# Patient Record
Sex: Male | Born: 1935 | Race: White | Hispanic: No | Marital: Married | Smoking: Current every day smoker
Health system: Southern US, Community
[De-identification: ages and names within clinical notes are randomized; demographics above are authoritative.]

## PROBLEM LIST (undated history)

## (undated) DIAGNOSIS — R55 Syncope and collapse: Secondary | ICD-10-CM

## (undated) DIAGNOSIS — J449 Chronic obstructive pulmonary disease, unspecified: Secondary | ICD-10-CM

## (undated) DIAGNOSIS — IMO0002 Reserved for concepts with insufficient information to code with codable children: Secondary | ICD-10-CM

## (undated) DIAGNOSIS — Z972 Presence of dental prosthetic device (complete) (partial): Secondary | ICD-10-CM

## (undated) DIAGNOSIS — E7800 Pure hypercholesterolemia, unspecified: Secondary | ICD-10-CM

## (undated) DIAGNOSIS — Z720 Tobacco use: Secondary | ICD-10-CM

## (undated) DIAGNOSIS — R0600 Dyspnea, unspecified: Secondary | ICD-10-CM

## (undated) DIAGNOSIS — N183 Chronic kidney disease, stage 3 unspecified: Secondary | ICD-10-CM

## (undated) DIAGNOSIS — F419 Anxiety disorder, unspecified: Secondary | ICD-10-CM

## (undated) DIAGNOSIS — N400 Enlarged prostate without lower urinary tract symptoms: Secondary | ICD-10-CM

## (undated) DIAGNOSIS — I10 Essential (primary) hypertension: Secondary | ICD-10-CM

## (undated) DIAGNOSIS — R5382 Chronic fatigue, unspecified: Secondary | ICD-10-CM

## (undated) DIAGNOSIS — E119 Type 2 diabetes mellitus without complications: Secondary | ICD-10-CM

## (undated) DIAGNOSIS — M1990 Unspecified osteoarthritis, unspecified site: Secondary | ICD-10-CM

## (undated) DIAGNOSIS — I739 Peripheral vascular disease, unspecified: Secondary | ICD-10-CM

## (undated) HISTORY — PX: APPENDECTOMY: SHX54

## (undated) HISTORY — DX: Reserved for concepts with insufficient information to code with codable children: IMO0002

## (undated) HISTORY — DX: Chronic obstructive pulmonary disease, unspecified: J44.9

## (undated) HISTORY — DX: Tobacco use: Z72.0

## (undated) HISTORY — PX: SPINE SURGERY: SHX786

## (undated) HISTORY — DX: Chronic fatigue, unspecified: R53.82

## (undated) HISTORY — PX: OTHER SURGICAL HISTORY: SHX169

---

## 2003-03-22 ENCOUNTER — Encounter: Payer: Self-pay | Admitting: Internal Medicine

## 2003-03-22 ENCOUNTER — Encounter: Admission: RE | Admit: 2003-03-22 | Discharge: 2003-03-22 | Payer: Self-pay | Admitting: Internal Medicine

## 2004-10-23 ENCOUNTER — Ambulatory Visit: Payer: Self-pay | Admitting: Family Medicine

## 2005-09-02 ENCOUNTER — Emergency Department (HOSPITAL_COMMUNITY): Admission: EM | Admit: 2005-09-02 | Discharge: 2005-09-02 | Payer: Self-pay | Admitting: Emergency Medicine

## 2008-02-03 ENCOUNTER — Inpatient Hospital Stay (HOSPITAL_COMMUNITY): Admission: EM | Admit: 2008-02-03 | Discharge: 2008-02-05 | Payer: Self-pay | Admitting: Emergency Medicine

## 2008-02-03 ENCOUNTER — Ambulatory Visit: Payer: Self-pay | Admitting: Pulmonary Disease

## 2008-02-03 ENCOUNTER — Ambulatory Visit: Payer: Self-pay | Admitting: Internal Medicine

## 2008-02-04 ENCOUNTER — Encounter: Payer: Self-pay | Admitting: Pulmonary Disease

## 2008-02-05 ENCOUNTER — Encounter (INDEPENDENT_AMBULATORY_CARE_PROVIDER_SITE_OTHER): Payer: Self-pay | Admitting: *Deleted

## 2008-02-05 ENCOUNTER — Encounter (INDEPENDENT_AMBULATORY_CARE_PROVIDER_SITE_OTHER): Payer: Self-pay | Admitting: Internal Medicine

## 2008-02-05 DIAGNOSIS — I10 Essential (primary) hypertension: Secondary | ICD-10-CM | POA: Insufficient documentation

## 2008-02-05 DIAGNOSIS — J209 Acute bronchitis, unspecified: Secondary | ICD-10-CM | POA: Insufficient documentation

## 2008-02-05 DIAGNOSIS — M889 Osteitis deformans of unspecified bone: Secondary | ICD-10-CM | POA: Insufficient documentation

## 2008-02-05 DIAGNOSIS — F17200 Nicotine dependence, unspecified, uncomplicated: Secondary | ICD-10-CM | POA: Insufficient documentation

## 2008-02-05 DIAGNOSIS — J449 Chronic obstructive pulmonary disease, unspecified: Secondary | ICD-10-CM | POA: Insufficient documentation

## 2008-02-14 ENCOUNTER — Encounter (INDEPENDENT_AMBULATORY_CARE_PROVIDER_SITE_OTHER): Payer: Self-pay | Admitting: *Deleted

## 2008-02-14 ENCOUNTER — Ambulatory Visit: Payer: Self-pay | Admitting: Internal Medicine

## 2008-02-14 LAB — CONVERTED CEMR LAB
BUN: 27 mg/dL — ABNORMAL HIGH (ref 6–23)
CO2: 26 meq/L (ref 19–32)
Calcium: 9.9 mg/dL (ref 8.4–10.5)
Chloride: 101 meq/L (ref 96–112)
Creatinine, Ser: 1.78 mg/dL — ABNORMAL HIGH (ref 0.40–1.50)
Glucose, Bld: 95 mg/dL (ref 70–99)
Potassium: 5 meq/L (ref 3.5–5.3)
Sodium: 139 meq/L (ref 135–145)

## 2008-02-28 ENCOUNTER — Encounter: Payer: Self-pay | Admitting: Internal Medicine

## 2008-02-28 ENCOUNTER — Ambulatory Visit: Payer: Self-pay | Admitting: Internal Medicine

## 2008-02-28 LAB — CONVERTED CEMR LAB
BUN: 19 mg/dL (ref 6–23)
Basophils Absolute: 0 10*3/uL (ref 0.0–0.1)
Basophils Relative: 0 % (ref 0–1)
Bilirubin Urine: NEGATIVE
CO2: 30 meq/L (ref 19–32)
Calcium: 9.1 mg/dL (ref 8.4–10.5)
Chloride: 97 meq/L (ref 96–112)
Creatinine, Ser: 1.34 mg/dL (ref 0.40–1.50)
Creatinine, Urine: 124.9 mg/dL
Eosinophils Absolute: 0.4 10*3/uL (ref 0.0–0.7)
Eosinophils Relative: 5 % (ref 0–5)
Glucose, Bld: 135 mg/dL — ABNORMAL HIGH (ref 70–99)
HCT: 43.8 % (ref 39.0–52.0)
Hemoglobin, Urine: NEGATIVE
Hemoglobin: 15.1 g/dL (ref 13.0–17.0)
Ketones, ur: NEGATIVE mg/dL
Leukocytes, UA: NEGATIVE
Lymphocytes Relative: 21 % (ref 12–46)
Lymphs Abs: 1.6 10*3/uL (ref 0.7–4.0)
MCHC: 34.4 g/dL (ref 30.0–36.0)
MCV: 89.3 fL (ref 78.0–100.0)
Monocytes Absolute: 0.6 10*3/uL (ref 0.1–1.0)
Monocytes Relative: 9 % (ref 3–12)
Neutro Abs: 4.9 10*3/uL (ref 1.7–7.7)
Neutrophils Relative %: 65 % (ref 43–77)
Nitrite: NEGATIVE
Platelets: 182 10*3/uL (ref 150–400)
Potassium: 4.3 meq/L (ref 3.5–5.3)
Protein, ur: NEGATIVE mg/dL
RBC: 4.91 M/uL (ref 4.22–5.81)
RDW: 13.9 % (ref 11.5–15.5)
Sodium, Ur: 113 meq/L
Sodium: 135 meq/L (ref 135–145)
Specific Gravity, Urine: 1.02 (ref 1.005–1.03)
Urine Glucose: NEGATIVE mg/dL
Urobilinogen, UA: 0.2 (ref 0.0–1.0)
WBC: 7.5 10*3/uL (ref 4.0–10.5)
pH: 5 (ref 5.0–8.0)

## 2008-02-29 ENCOUNTER — Encounter: Payer: Self-pay | Admitting: Internal Medicine

## 2008-02-29 LAB — CONVERTED CEMR LAB
Aldosterone, Serum: 5
PRA: 4.6

## 2008-03-05 ENCOUNTER — Telehealth: Payer: Self-pay | Admitting: Internal Medicine

## 2008-03-13 ENCOUNTER — Ambulatory Visit: Payer: Self-pay | Admitting: *Deleted

## 2008-03-13 ENCOUNTER — Encounter: Payer: Self-pay | Admitting: Internal Medicine

## 2008-03-13 DIAGNOSIS — R5381 Other malaise: Secondary | ICD-10-CM | POA: Insufficient documentation

## 2008-03-13 DIAGNOSIS — R5383 Other fatigue: Secondary | ICD-10-CM

## 2008-03-14 LAB — CONVERTED CEMR LAB
Free T4: 1.05 ng/dL (ref 0.89–1.80)
Sed Rate: 27 mm/hr — ABNORMAL HIGH (ref 0–16)
Testosterone: 353.02 ng/dL (ref 350–890)
Vitamin B-12: 406 pg/mL (ref 211–911)

## 2008-04-30 ENCOUNTER — Ambulatory Visit: Payer: Self-pay | Admitting: Internal Medicine

## 2008-12-18 ENCOUNTER — Ambulatory Visit: Payer: Self-pay | Admitting: *Deleted

## 2008-12-18 ENCOUNTER — Encounter: Payer: Self-pay | Admitting: Internal Medicine

## 2008-12-18 ENCOUNTER — Ambulatory Visit (HOSPITAL_COMMUNITY): Admission: RE | Admit: 2008-12-18 | Discharge: 2008-12-18 | Payer: Self-pay | Admitting: *Deleted

## 2008-12-18 ENCOUNTER — Encounter (INDEPENDENT_AMBULATORY_CARE_PROVIDER_SITE_OTHER): Payer: Self-pay | Admitting: Internal Medicine

## 2008-12-24 LAB — CONVERTED CEMR LAB
ALT: 27 units/L (ref 0–53)
AST: 24 units/L (ref 0–37)
Albumin: 4.6 g/dL (ref 3.5–5.2)
Alkaline Phosphatase: 388 units/L — ABNORMAL HIGH (ref 39–117)
BUN: 9 mg/dL (ref 6–23)
Band Neutrophils: 0 % (ref 0–10)
Basophils Absolute: 0 10*3/uL (ref 0.0–0.1)
Basophils Relative: 0 % (ref 0–1)
CO2: 23 meq/L (ref 19–32)
Calcium: 9.3 mg/dL (ref 8.4–10.5)
Chloride: 105 meq/L (ref 96–112)
Cholesterol: 155 mg/dL (ref 0–200)
Creatinine, Ser: 1.29 mg/dL (ref 0.40–1.50)
Creatinine, Urine: 263.9 mg/dL
Eosinophils Absolute: 0.2 10*3/uL (ref 0.0–0.7)
Eosinophils Relative: 2 % (ref 0–5)
Glucose, Bld: 99 mg/dL (ref 70–99)
HCT: 45 % (ref 39.0–52.0)
HDL: 28 mg/dL — ABNORMAL LOW (ref >39)
Hemoglobin: 15.4 g/dL (ref 13.0–17.0)
LDL Cholesterol: 96 mg/dL (ref 0–99)
Lymphocytes Relative: 27 % (ref 12–46)
Lymphs Abs: 1.9 10*3/uL (ref 0.7–4.0)
MCHC: 34.2 g/dL (ref 30.0–36.0)
MCV: 90 fL (ref 78.0–100.0)
Microalb Creat Ratio: 15.8 mg/g (ref 0.0–30.0)
Microalb, Ur: 4.16 mg/dL — ABNORMAL HIGH (ref 0.00–1.89)
Monocytes Absolute: 0.7 10*3/uL (ref 0.1–1.0)
Monocytes Relative: 10 % (ref 3–12)
Neutro Abs: 4.3 10*3/uL (ref 1.7–7.7)
Neutrophils Relative %: 60 % (ref 43–77)
Platelets: 229 10*3/uL (ref 150–400)
Potassium: 4.1 meq/L (ref 3.5–5.3)
RBC: 5 M/uL (ref 4.22–5.81)
RDW: 13.6 % (ref 11.5–15.5)
Sodium: 144 meq/L (ref 135–145)
Total Bilirubin: 0.4 mg/dL (ref 0.3–1.2)
Total CHOL/HDL Ratio: 5.5
Total Protein: 7.3 g/dL (ref 6.0–8.3)
Triglycerides: 156 mg/dL — ABNORMAL HIGH (ref <150)
VLDL: 31 mg/dL (ref 0–40)
WBC: 7 10*3/uL (ref 4.0–10.5)

## 2010-01-02 ENCOUNTER — Telehealth (INDEPENDENT_AMBULATORY_CARE_PROVIDER_SITE_OTHER): Payer: Self-pay | Admitting: *Deleted

## 2010-02-02 ENCOUNTER — Ambulatory Visit: Payer: Self-pay | Admitting: Internal Medicine

## 2010-02-02 DIAGNOSIS — N400 Enlarged prostate without lower urinary tract symptoms: Secondary | ICD-10-CM | POA: Insufficient documentation

## 2010-02-02 LAB — CONVERTED CEMR LAB

## 2010-02-04 LAB — CONVERTED CEMR LAB
ALT: 20 units/L (ref 0–53)
AST: 17 units/L (ref 0–37)
Albumin: 4.4 g/dL (ref 3.5–5.2)
Alkaline Phosphatase: 341 units/L — ABNORMAL HIGH (ref 39–117)
BUN: 14 mg/dL (ref 6–23)
Bacteria, UA: NONE SEEN
Bilirubin Urine: NEGATIVE
CO2: 25 meq/L (ref 19–32)
Calcium: 9.6 mg/dL (ref 8.4–10.5)
Casts: NONE SEEN /lpf
Chloride: 104 meq/L (ref 96–112)
Cholesterol: 142 mg/dL (ref 0–200)
Creatinine, Ser: 1.19 mg/dL (ref 0.40–1.50)
Creatinine, Urine: 259.6 mg/dL
Crystals: NONE SEEN
Glucose, Bld: 103 mg/dL — ABNORMAL HIGH (ref 70–99)
HDL: 28 mg/dL — ABNORMAL LOW (ref >39)
Hemoglobin, Urine: NEGATIVE
Ketones, ur: NEGATIVE mg/dL
LDL Cholesterol: 91 mg/dL (ref 0–99)
Microalb Creat Ratio: 21.5 mg/g (ref 0.0–30.0)
Microalb, Ur: 5.58 mg/dL — ABNORMAL HIGH (ref 0.00–1.89)
Nitrite: NEGATIVE
Potassium: 5 meq/L (ref 3.5–5.3)
Protein, ur: NEGATIVE mg/dL
Sodium: 140 meq/L (ref 135–145)
Specific Gravity, Urine: 1.024 (ref 1.005–1.0)
Total Bilirubin: 0.5 mg/dL (ref 0.3–1.2)
Total CHOL/HDL Ratio: 5.1
Total Protein: 7 g/dL (ref 6.0–8.3)
Triglycerides: 117 mg/dL (ref <150)
Urine Glucose: NEGATIVE mg/dL
Urobilinogen, UA: 0.2 (ref 0.0–1.0)
VLDL: 23 mg/dL (ref 0–40)
pH: 5.5 (ref 5.0–8.0)

## 2010-02-27 ENCOUNTER — Encounter: Payer: Self-pay | Admitting: Internal Medicine

## 2010-03-04 ENCOUNTER — Encounter: Payer: Self-pay | Admitting: Internal Medicine

## 2010-03-04 ENCOUNTER — Ambulatory Visit: Payer: Self-pay | Admitting: Infectious Disease

## 2010-03-04 ENCOUNTER — Ambulatory Visit (HOSPITAL_COMMUNITY): Admission: RE | Admit: 2010-03-04 | Discharge: 2010-03-04 | Payer: Self-pay | Admitting: Infectious Disease

## 2010-03-05 LAB — CONVERTED CEMR LAB
BUN: 16 mg/dL (ref 6–23)
Basophils Absolute: 0 10*3/uL (ref 0.0–0.1)
Basophils Relative: 0 % (ref 0–1)
CO2: 29 meq/L (ref 19–32)
Calcium: 9.7 mg/dL (ref 8.4–10.5)
Chloride: 100 meq/L (ref 96–112)
Creatinine, Ser: 1.22 mg/dL (ref 0.40–1.50)
Eosinophils Absolute: 0.2 10*3/uL (ref 0.0–0.7)
Eosinophils Relative: 2 % (ref 0–5)
Glucose, Bld: 126 mg/dL — ABNORMAL HIGH (ref 70–99)
HCT: 45.4 % (ref 39.0–52.0)
Hemoglobin: 15.4 g/dL (ref 13.0–17.0)
Lymphocytes Relative: 19 % (ref 12–46)
Lymphs Abs: 1.5 10*3/uL (ref 0.7–4.0)
MCHC: 33.9 g/dL (ref 30.0–36.0)
MCV: 91.3 fL (ref >=78.0)
Magnesium: 2 mg/dL (ref 1.5–2.5)
Monocytes Absolute: 0.7 10*3/uL (ref 0.1–1.0)
Monocytes Relative: 9 % (ref 3–12)
Neutro Abs: 5.4 10*3/uL (ref 1.7–7.7)
Neutrophils Relative %: 69 % (ref 43–77)
Phosphorus: 2.6 mg/dL (ref 2.3–4.6)
Platelets: 216 10*3/uL (ref 150–400)
Potassium: 4 meq/L (ref 3.5–5.3)
RBC: 4.97 M/uL (ref 4.22–5.81)
RDW: 14 % (ref 11.5–15.5)
Sodium: 140 meq/L (ref 135–145)
TSH: 2.487 microintl units/mL (ref 0.350–4.5)
Vitamin B-12: 479 pg/mL (ref 211–911)
WBC: 7.8 10*3/uL (ref 4.0–10.5)

## 2010-04-06 ENCOUNTER — Telehealth: Payer: Self-pay | Admitting: Internal Medicine

## 2010-07-29 ENCOUNTER — Ambulatory Visit: Payer: Self-pay | Admitting: Internal Medicine

## 2010-09-02 ENCOUNTER — Telehealth (INDEPENDENT_AMBULATORY_CARE_PROVIDER_SITE_OTHER): Payer: Self-pay | Admitting: *Deleted

## 2010-09-02 ENCOUNTER — Ambulatory Visit: Payer: Self-pay | Admitting: Internal Medicine

## 2010-09-11 ENCOUNTER — Encounter: Payer: Self-pay | Admitting: Internal Medicine

## 2010-09-23 ENCOUNTER — Encounter (INDEPENDENT_AMBULATORY_CARE_PROVIDER_SITE_OTHER): Payer: Self-pay | Admitting: *Deleted

## 2010-11-17 NOTE — Miscellaneous (Signed)
Summary: OUT COMES/MEDICAL RECORDS  OUT COMES/MEDICAL RECORDS   Imported By: Margie Billet 03/04/2010 10:33:58  _____________________________________________________________________  External Attachment:    Type:   Image     Comment:   External Document

## 2010-11-17 NOTE — Assessment & Plan Note (Signed)
Summary: FU/EST/VS Add lab per gladys   Vital Signs:  Patient Profile:   75 Years Old Male Height:     72 inches (182.88 cm) Weight:      191.6 pounds (87.09 kg) BMI:     26.08 Temp:     96.8 degrees F Pulse rate:   56 / minute BP sitting:   143 / 78  (right arm)  Pt. in pain?   no  Vitals Entered By: Dorie Rank RN (April 30, 2008 9:41 AM)              Is Patient Diabetic? No Nutritional Status BMI of 25 - 29 = overweight  Does patient need assistance? Functional Status Self care Ambulation Normal     Chief Complaint:  check up- wants to change b/p med b/c "it is making me tired"- companion states patient tries to do activities without eating first.  History of Present Illness: Zachary Santos is a 75 yo man with PMH as outlined in chart.  He is here today for follow up of his BP.  He only complains of feeling tired after just some activity (working in his shop for about 15 minutes).  He is just "wiped out" and tired.  Wife states he does not eat breakfast and only has some coffee and thinks that may be the reason.  However, he does snack often, though apparently does not have large meals.  He denies pain anywhere, no chest pain, sob, orthopnea, palpitations, weight loss, constipation, diarrhea, hematochezia, melena.    He has not had colonoscopy before or PSA/DRE.         Prior Medications Reviewed Using: Patient Recall  Prior Medication List:  ALBUTEROL 90 MCG/ACT  AERS (ALBUTEROL) one puff every 6 hours as needed for shortness of breath ASPIRIN 81 MG  TBEC (ASPIRIN) one tab daily NORVASC 10 MG  TABS (AMLODIPINE BESYLATE) Take 1 tablet by mouth once a day NIASPAN 500 MG  TBCR (NIACIN (ANTIHYPERLIPIDEMIC)) Take 1 tab by mouth at bedtime   Current Allergies: No known allergies   Past Medical History:    Reviewed history from 03/13/2008 and no changes required:       COPD       tobacco abuse       HTN       Acute renal failure (after hospital discharge on  HCTZ and ACEI)   Social History:    Reviewed history from 03/13/2008 and no changes required:       He is married and a retired Naval architect. He lives at home with his wife. He has largely ignored his medical care until recently.        Current Smoker   Risk Factors:  Tobacco use:  current    Year started:  60 yrs ago    Cigarettes:  Yes -- 1.5 pack(s) per day    Counseled to quit/cut down tobacco use:  yes Passive smoke exposure:  yes HIV high-risk behavior:  no Caffeine use:  3 drinks per day Alcohol use:  no Exercise:  no Seatbelt use:  100 % Sun Exposure:  occasionally   Review of Systems      See HPI   Physical Exam  General:     alert, well-developed, well-nourished, well-hydrated, normal appearance, and cooperative to examination.   Head:     normocephalic and atraumatic.   Eyes:     anicteric and no injection Lungs:     normal respiratory effort and no accessory  muscle use.   Extremities:     no edema Neurologic:     alert & oriented X3, cranial nerves II-XII intact, strength normal in all extremities, and gait normal.   Psych:     Oriented X3, memory intact for recent and remote, normally interactive, good eye contact, not anxious appearing, and not depressed appearing.      Impression & Recommendations:  Problem # 1:  ESSENTIAL HYPERTENSION, BENIGN (ICD-401.1) improved today though still not at goal.  He was tried on HCTZ/lisinopril but taken off 2/2 development of rash.  He was initially taken off of ACEI with some improvement and then came off of HCTZl.  Will continue with norvasc and follow up in 1 month.  If still elevated, will discuss options.  Could consider restarting HCTZ, especially since started to improve while still on it.  If not willing to try, could consider coreg (beta and alpha blockade).   His updated medication list for this problem includes:    Norvasc 10 Mg Tabs (Amlodipine besylate) .[name]... Take 1 tablet by mouth once a day  BP  today: 143/78 Prior BP: 164/84 (03/13/2008)  Labs Reviewed: Creat: 1.34 (02/28/2008)   Problem # 2:  Preventive Health Care (ICD-V70.0) Discussed at length the options for colorectal cancer screening as well as prostate.  He is not very interested in procedures, therefore, will check stool cards.  As for PSA, he states that since he is asymptomatic, he prefers to avoid any test that may lead to procedure.  FUrthermore, he stated that if he develops any urinary symptoms (as discussed) he would then proceed.    Problem # 3:  FATIGUE (ICD-780.79) Checked some preliminary labs last visit including thyroid, B12, Hgb, and testosterone.  He denies any other symptoms.  May be secondary to deconditioning since he, and his wife, state he is rather inactive.  Have advised on regular exercise and to call if any changes occur.   Problem # 4:  DYSLIPIDEMIA (ICD-272.4) On niaspan for low HDL (22), LDL was 96.  Given age, HTN, and smoking, will repeat lipids in 3 months from prior and consider starting statin.  His updated medication list for this problem includes:    Niaspan 500 Mg Tbcr (Niacin (antihyperlipidemic)) .[name]... Take 1 tab by mouth at bedtime   Complete Medication List: 1)  Albuterol 90 Mcg/act Aers (Albuterol) .... One puff every 6 hours as needed for shortness of breath 2)  Aspirin 81 Mg Tbec (Aspirin) .... One tab daily 3)  Norvasc 10 Mg Tabs (Amlodipine besylate) .... Take 1 tablet by mouth once a day 4)  Niaspan 500 Mg Tbcr (Niacin (antihyperlipidemic)) .... Take 1 tab by mouth at bedtime  Other Orders: T-Hemoccult Cards-Multiple ([address])   Patient Instructions: 1)  Please schedule a follow-up appointment in 1 month. 2)  Continue your current medications. 3)  Tobacco is very bad for your health and your loved ones! You Should stop smoking!. 4)  Stop Smoking Tips: Choose a Quit date. Cut down before the Quit date. decide what you will do as a substitute when you feel the urge to  smoke(gum,toothpick,exercise). 5)  It is important that you exercise regularly at least 20 minutes 5 times a week. If you develop chest pain, have severe difficulty breathing, or feel very tired , stop exercising immediately and seek medical attention. 6)  Complete your Hemocult cards and return them soon. 7)  Take an Aspirin every day.   ]

## 2010-11-17 NOTE — Progress Notes (Signed)
Summary: REFILLED ONE TIME BUT DR Onalee Hua TO REFILL IF CONTINUED  Phone Note Refill Request Message from:  Patient on January 02, 2010 9:08 AM  Refills Requested: Medication #1:  NORVASC 10 MG  TABS Take 1 tablet by mouth once a day  Medication #2:  COREG 3.125 MG TABS Take 1 tablet by mouth two times a day Pt. has an appt. w/Dr. Onalee Hua 02/02/10. Last labs 12/18/08.   Method Requested: Electronic Initial call taken by: Chinita Pester RN,  January 02, 2010 9:08 AM  Follow-up for Phone Call        WILL GIVE ONE REFILL THEN NEEDS TO BE REFILLED BY DR Onalee Hua Follow-up by: Clydie Braun MD,  January 02, 2010 9:38 AM    Prescriptions: COREG 3.125 MG TABS (CARVEDILOL) Take 1 tablet by mouth two times a day  #62 x 1   Entered and Authorized by:   Clydie Braun MD   Signed by:   Clydie Braun MD on 01/02/2010   Method used:   Electronically to        [facility] [address] (retail)       [address]       [place], [place]  [address]       Ph: [phone]       Fax: [phone]   RxID:   [id number] NORVASC 10 MG  TABS (AMLODIPINE BESYLATE) Take 1 tablet by mouth once a day  #30 x 1   Entered and Authorized by:   Clydie Braun MD   Signed by:   Clydie Braun MD on 01/02/2010   Method used:   Electronically to        [facility] [address] (retail)       [address]       [place], [place]  [address]       Ph: [phone]       Fax: [phone]   RxID:   [id number]

## 2010-11-17 NOTE — Assessment & Plan Note (Signed)
            Complete Medication List: 1)  Norvasc 5 Mg Tabs (Amlodipine besylate) .... One tab by mouth daily 2)  Hydrochlorothiazide 25 Mg Tabs (Hydrochlorothiazide) .... One tab by mouth daily 3)  Zocor 20 Mg Tabs (Simvastatin) .... One tab by mouth daily 4)  Azithromycin 250 Mg Tabs (Azithromycin) .... One tab by mouth daily for 7 days 5)  Albuterol 90 Mcg/act Aers (Albuterol) .... One puff every 6 hours as needed for shortness of breath 6)  Eql Nicotine 21 Mg/24hr Pt24 (Nicotine) .... One patch every 24 hours for 2 weeks     Prescriptions: EQL NICOTINE 21 MG/24HR  PT24 (NICOTINE) one patch every 24 hours for 2 weeks  #2 week supp x 0   Entered and Authorized by:   Lollie Sails MD   Signed by:   Lollie Sails MD on 02/05/2008   Method used:   Handwritten   RxID:   [id number] ALBUTEROL 90 MCG/ACT  AERS (ALBUTEROL) one puff every 6 hours as needed for shortness of breath  #1 month supp x 3   Entered and Authorized by:   Lollie Sails MD   Signed by:   Lollie Sails MD on 02/05/2008   Method used:   Handwritten   RxID:   [id number] AZITHROMYCIN 250 MG  TABS (AZITHROMYCIN) one tab by mouth daily for 7 days  #7 x 0   Entered and Authorized by:   Lollie Sails MD   Signed by:   Lollie Sails MD on 02/05/2008   Method used:   Handwritten   RxID:   [id number] ZOCOR 20 MG  TABS (SIMVASTATIN) one tab by mouth daily  #30 x 3   Entered and Authorized by:   Lollie Sails MD   Signed by:   Lollie Sails MD on 02/05/2008   Method used:   Handwritten   RxID:   [id number] HYDROCHLOROTHIAZIDE 25 MG  TABS (HYDROCHLOROTHIAZIDE) one tab by mouth daily  #30 x 3   Entered and Authorized by:   Lollie Sails MD   Signed by:   Lollie Sails MD on 02/05/2008   Method used:   Handwritten   RxID:   [id number] NORVASC 5 MG  TABS (AMLODIPINE BESYLATE) one tab by mouth daily  #30 x 3   Entered and Authorized by:   Lollie Sails MD   Signed by:   Lollie Sails MD on  02/05/2008   Method used:   Handwritten   RxID:   [id number]  ]

## 2010-11-17 NOTE — Progress Notes (Signed)
  Phone Note Call from Patient Call back at Home Phone [phone]   Caller: Patient Call For: Dr. Onalee Hua Summary of Call: Pt states Dr. Onalee Hua wanted him to call in his B/P 5/14 159/76 5/15 147/79 5/16 143/85 5/17 168/78 5/18 157/80 5/19 154/82 Initial call taken by: [name] RN, charted by Dorie Rank, RN, 03/05/08,3:20 P  Follow-up for Phone Call        Returned call to pt.  He is doing well exept for cough similar to previous which he was given azithromycin for.  Otherwise he doing well with norvasc.  BP only mildly to moderately elevated and no need to normalize in few days.  Will see in follow up and if still elevated, will increase to 10mg  dialy.  Will also refill azithromycin. Follow-up by: Mariea Stable MD,  Mar 05, 2008 3:43 PM    New/Updated Medications: ZITHROMAX Z-PAK 250 MG  TABS (AZITHROMYCIN) take 2 tablets first day, then 1 tablet daily   Prescriptions: ZITHROMAX Z-PAK 250 MG  TABS (AZITHROMYCIN) take 2 tablets first day, then 1 tablet daily  #6 tabs x 0   Entered and Authorized by:   Mariea Stable MD   Signed by:   Mariea Stable MD on 03/05/2008   Method used:   Electronically sent to ...       [facility] [address]       [address]       [place], [place]  [address]       Ph: [phone]       Fax: [phone]   RxID:   [id number]

## 2010-11-17 NOTE — Miscellaneous (Signed)
  Clinical Lists Changes  Medications: Added new medication of ASPIRIN 81 MG  TBEC (ASPIRIN) one tab daily - Signed Rx of ASPIRIN 81 MG  TBEC (ASPIRIN) one tab daily;  #30 x 0;  Signed;  Entered by: Lollie Sails MD;  Authorized by: Lollie Sails MD;  Method used: Handwritten    Prescriptions: ASPIRIN 81 MG  TBEC (ASPIRIN) one tab daily  #30 x 0   Entered and Authorized by:   Lollie Sails MD   Signed by:   Lollie Sails MD on 02/05/2008   Method used:   Handwritten   RxID:   [id number]

## 2010-11-17 NOTE — Consult Note (Signed)
[facility]   Imported By: Esmeralda Links D'jimraou 03/14/2008 13:30:47  _____________________________________________________________________  External Attachment:    Type:   Image     Comment:   External Document

## 2010-11-17 NOTE — Assessment & Plan Note (Signed)
Summary: CHECKUP/SB.   Vital Signs:  Patient profile:   75 year old male Height:      72 inches (182.88 cm) Weight:      197.03 pounds (89.56 kg) BMI:     26.82 Temp:     95.7 degrees F (35.39 degrees C) oral Pulse rate:   60 / minute BP sitting:   175 / 82  (right arm)  Vitals Entered By: Angelina Ok RN (February 02, 2010 10:40 AM) Is Patient Diabetic? No Pain Assessment Patient in pain? no      Nutritional Status BMI of 25 - 29 = overweight  Have you ever been in a relationship where you felt threatened, hurt or afraid?No   Does patient need assistance? Functional Status Self care Ambulation Normal Comments Needs refills on meds. Check up.  ? Need for change in meds.   History of Present Illness: Zachary Santos is a 75 yo man with PMH as outlined below.  He is here for routine visit.  Of note, he has not been here in quite some time.  He reports that he is concerned about his blood pressure being high.  He reports taking his BP meds regularly.  STates his SBP is usually 150s-170s.    He is doing well overall. Prior issues with hemoptysis have resolved. Denies chest pain, shortness of breath. He does report increased nocturia, longer time to complete urination, weaker stream.  Depression History:      The patient denies a depressed mood most of the day and a diminished interest in his usual daily activities.         Preventive Screening-Counseling & Management  Alcohol-Tobacco     Smoking Status: current     Smoking Cessation Counseling: yes     Packs/Day: 1.5- 2 packs     Year Started: 60 yrs ago     Passive Smoke Exposure: yes  Current Medications (verified): 1)  Aspirin 81 Mg  Tbec (Aspirin) .... One Tab Daily 2)  Norvasc 10 Mg  Tabs (Amlodipine Besylate) .... Take 1 Tablet By Mouth Once A Day 3)  Coreg 3.125 Mg Tabs (Carvedilol) .... Take 1 Tablet By Mouth Two Times A Day  Allergies (verified): No Known Drug Allergies  Past History:  Past Medical  History: COPD    -PFTs done > 10 years ago tobacco abuse HTN Acute renal failure (after hospital discharge on HCTZ and ACEI)  Past Surgical History: cervical spine surgery surgery related to trigeminal nerve? in [place] laparscopic chole  Social History: He is married and a retired Naval architect.  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  Current Smoker Packs/Day:  1.5- 2 packs  Review of Systems      See HPI  Physical Exam  General:  alert, well-developed, well-nourished, well-hydrated, normal appearance, and cooperative to examination.   Eyes:  anicteric and no injection Neck:  supple and no carotid bruits.   Lungs:  normal respiratory effort, no accessory muscle use, normal breath sounds (though distant), no crackles, and no wheezes.   Heart:  normal rate, regular rhythm, no murmur, no gallop, and no rub.   Abdomen:  soft, non-tender, normal bowel sounds, and no distention.  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   Psych:  Oriented X3, memory intact for recent and remote, normally  interactive, good eye contact, not anxious appearing, and not depressed appearing.     Impression & Recommendations:  Problem # 1:  ESSENTIAL HYPERTENSION, BENIGN (ICD-401.1)  On small dose of coreg Complains of ED, B-blocker may contribute Will d/'c coreg and start HCTZ and monitor Cr and K Will also start flomax for BPH  His updated medication list for this problem includes:    Norvasc 10 Mg Tabs (Amlodipine besylate) .[name]... Take 1 tablet by mouth once a day    Hydrochlorothiazide 25 Mg Tabs (Hydrochlorothiazide) .[name]... Take 1 tablet by mouth once a day    Doxazosin Mesylate 1 Mg Tabs (Doxazosin mesylate) .[name]... Take 1 tab by mouth at bedtime  BP today: 175/82 Prior BP: 165/91 (12/18/2008)  Labs  Reviewed: K+: 4.1 (12/18/2008) Creat: : 1.29 (12/18/2008)   Chol: 155 (12/18/2008)   HDL: 28 (12/18/2008)   LDL: 96 (12/18/2008)   TG: 156 (12/18/2008)  Orders: T-Urinalysis ([id number]) T-Urine Microalbumin w/creat. ratio [phone])  Problem # 2:  DYSLIPIDEMIA (ICD-272.4) will check lipids and LFTs today  Orders: T-Lipid Profile [id number])  Labs Reviewed: SGOT: 24 (12/18/2008)   SGPT: 27 (12/18/2008)   HDL:28 (12/18/2008)  LDL:96 (12/18/2008)  Chol:155 (12/18/2008)  Trig:156 (12/18/2008)  Problem # 3:  Hx of HEMOPTYSIS (ICD-786.3) resolved  Problem # 4:  TOBACCO ABUSE (ICD-305.1) not interested in quiting  Problem # 5:  COPD (ICD-496) Pt reports having PFTs many years ago Currently no wheezing and never uses albuterol.....taken off list He denies any symptoms currently  The following medications were removed from the medication list:    Albuterol 90 Mcg/act Aers (Albuterol) ..... One puff every 6 hours as needed for shortness of breath  Problem # 6:  BENIGN PROSTATIC HYPERTROPHY, HX OF (ICD-V13.8) Pt reports symptoms c/w BPH Refused DRE and PSA discussed with decision not to check Will check UA and renal function Start doxazosin 1mg  by mouth at bedtime and titrate up as needed  Problem # 7:  ERECTILE DYSFUNCTION, ORGANIC (ICD-607.84) Pt reports erectile dysfunction, likely due to atherosclerotic dz given HTN and tobacco use. Will give trial of viagra....again pt is not interested on aggressive work ups.  His updated medication list for this problem includes:    Viagra 100 Mg Tabs (Sildenafil citrate) .[name]... Take 1/2 tablet before sexual intercourse.  do not take more than 1 dose in a 24 hour period.  Complete Medication List: 1)  Aspirin 81 Mg Tbec (Aspirin) .... One tab daily 2)  Norvasc 10 Mg Tabs (Amlodipine besylate) .... Take 1 tablet by mouth once a day 3)  Hydrochlorothiazide 25 Mg Tabs (Hydrochlorothiazide) .... Take 1 tablet by mouth once a day 4)   Doxazosin Mesylate 1 Mg Tabs (Doxazosin mesylate) .... Take 1 tab by mouth at bedtime 5)  Viagra 100 Mg Tabs (Sildenafil citrate) .... Take 1/2 tablet before sexual intercourse.  do not take more than 1 dose in a 24 hour period.  Other Orders: T-Comprehensive Metabolic Panel ([id number])  Patient Instructions: 1)  Please schedule a follow-up appointment in 1 month. 2)  Stop taking the coreg (carvedilol) 3)  Start the hydrochlorothiazide for blood pressure in the morning. 4)  Start the doxazosin for prostate at bedtime 5)  Continue other medications below. 6)  Call clinic if you have any problems. 7)  Will check some blood work today and call you if there are any problems.  Prescriptions: VIAGRA 100 MG TABS (SILDENAFIL CITRATE) take 1/2 tablet before sexual intercourse.  Do not take more than 1 dose  in a 24 hour period.  #4 x 1   Entered and Authorized by:   Mariea Stable MD   Signed by:   Mariea Stable MD on 02/02/2010   Method used:   Electronically to        [facility] [address] (retail)       [address]       [place], [place]  [address]       Ph: [phone]       Fax: [phone]   RxID:   [id number] DOXAZOSIN MESYLATE 1 MG TABS (DOXAZOSIN MESYLATE) Take 1 tab by mouth at bedtime  #30 x 1   Entered and Authorized by:   Mariea Stable MD   Signed by:   Mariea Stable MD on 02/02/2010   Method used:   Electronically to        [facility] [address] (retail)       [address]       [place], [place]  [address]       Ph: [phone]       Fax: [phone]   RxID:   [id number] HYDROCHLOROTHIAZIDE 25 MG TABS (HYDROCHLOROTHIAZIDE) Take 1 tablet by mouth once a day  #30 x 1   Entered and Authorized by:   Mariea Stable MD   Signed by:   Mariea Stable MD on 02/02/2010   Method used:   Electronically to        [facility] [address] (retail)        [address]       [place], [place]  [address]       Ph: [phone]       Fax: [phone]   RxID:   [id number]   Prevention & Chronic Care Immunizations   Influenza vaccine: Not documented    Tetanus booster: Not documented    Pneumococcal vaccine: Not documented    H. zoster vaccine: Not documented   H. zoster vaccine deferral: Deferred  (02/02/2010)  Colorectal Screening   Hemoccult: Not documented   Hemoccult action/deferral: Refused  (02/02/2010)    Colonoscopy: Not documented   Colonoscopy action/deferral: Refused  (02/02/2010)  Other Screening   PSA: Not documented   PSA action/deferral: Discussed-PSA declined  (02/02/2010)   Smoking status: current  (02/02/2010)   Smoking cessation counseling: yes  (02/02/2010)  Lipids   Total Cholesterol: 155  (12/18/2008)   Lipid panel action/deferral: Lipid Panel ordered   LDL: 96  (12/18/2008)   LDL Direct: Not documented   HDL: 28  (12/18/2008)   Triglycerides: 156  (12/18/2008)    SGOT (AST): 24  (12/18/2008)   BMP action: Ordered   SGPT (ALT): 27  (12/18/2008) CMP ordered    Alkaline phosphatase: 388  (12/18/2008)   Total bilirubin: 0.4  (12/18/2008)    Lipid flowsheet reviewed?: Yes   Progress toward LDL goal: At goal  Hypertension   Last Blood Pressure: 175 / 82  (02/02/2010)   Serum creatinine: 1.29  (12/18/2008)   BMP action: Ordered   Serum potassium 4.1  (12/18/2008) CMP ordered     Hypertension flowsheet reviewed?: Yes   Progress toward BP goal: Unchanged  Self-Management Support :    Patient will work on the following items until the next clinic visit to reach self-care goals:     Medications and monitoring: take my  medicines every day, check my blood pressure, bring all of my medications to every visit  (02/02/2010)     Eating: drink diet soda or water instead of juice or soda, eat more vegetables, eat foods that are low in salt, eat baked foods instead of fried  foods, eat fruit for snacks and desserts, limit or avoid alcohol  (02/02/2010)     Activity: take a 30 minute walk every day  (02/02/2010)    Hypertension self-management support: Written self-care plan, Education handout, Pre-printed educational material  (02/02/2010)   Hypertension self-care plan printed.   Hypertension education handout printed    Lipid self-management support: Written self-care plan, Education handout, Pre-printed educational material  (02/02/2010)   Lipid self-care plan printed.   Lipid education handout printed  Process Orders Check Orders Results:     [facility]: Check successful Tests Sent for requisitioning (February 02, 2010 11:43 AM):     02/02/2010: [facility] -- T-Lipid Profile [id number] (signed)     02/02/2010: [facility] -- T-Comprehensive Metabolic Panel [[id number]] (signed)     02/02/2010: [facility] -- T-Urinalysis [[id number]] (signed)     02/02/2010: [facility] -- T-Urine Microalbumin w/creat. ratio [[id number]] (signed)     Vital Signs:  Patient profile:   75 year old male Height:      72 inches (182.88 cm) Weight:      197.03 pounds (89.56 kg) BMI:     26.82 Temp:     95.7 degrees F (35.39 degrees C) oral Pulse rate:   60 / minute BP sitting:   175 / 82  (right arm)  Vitals Entered By: Angelina Ok RN (February 02, 2010 10:40 AM)

## 2010-11-17 NOTE — Assessment & Plan Note (Signed)
Summary: blood in sputum x 1 month/pcp-alvarez/hla   Vital Signs:  Patient Profile:   75 Years Old Male Height:     72 inches (182.88 cm) Weight:      199.9 pounds (90.86 kg) BMI:     27.21 Temp:     97.0 degrees F (36.11 degrees C) oral Pulse rate:   71 / minute BP sitting:   165 / 91  (right arm)  Pt. in pain?   no  Vitals Entered By: [name] RN (December 18, 2008 2:25 PM)              Is Patient Diabetic? No Nutritional Status BMI of 25 - 29 = overweight Nutritional Status Detail appetite good  Have you ever been in a relationship where you felt threatened, hurt or afraid?denies   Does patient need assistance? Functional Status Self care Ambulation Normal     Chief Complaint:  Problem has happened x 2 over past year - this time for past 2 weeks. Dark [name] to red mucus coughs up early AM. Does some welding.[name]  History of Present Illness: Zachary Santos is a 75 yo man with PMH as outlined in chart.  He is here today because of hemoptysis. He notes that over the last year, in the morning he will cough up at first darm mucus, sometimes [name] or red blood. Over time he will hack up some more stuff that is then more clear. Altogether it is a fairly scant amount. Most of the time it is dark red, but sometimes there is a bright red component. The symptoms resolve generally by 10 a.m. This has been going on off and on for about a year, but has gradually worsened over the last month or so. 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 He produced some sputum for me, and there was some light red fluid in it, that may have been blood.  He has not had any difficulty breathing during the day, but sometimes at night, he wakes up short of breath, and forced exhalation helps for a time. If he lays down during the day, he generally does not get short of breath. He denies weight loss, night sweats, fevers, congestion, though he does sneeze a lot. He  denies a change in voice. He states that his urination and bowel habits have not changed. He has only been hacking up stuff for a year. There is no relation with eating food. He continues to smoke 1.5 ppd, and has absolutely no desire to quit.  PMH and SH reviewed and amended.       Prior Medications Reviewed Using: Patient Recall  Prior Medication List:  ALBUTEROL 90 MCG/ACT  AERS (ALBUTEROL) one puff every 6 hours as needed for shortness of breath ASPIRIN 81 MG  TBEC (ASPIRIN) one tab daily NORVASC 10 MG  TABS (AMLODIPINE BESYLATE) Take 1 tablet by mouth once a day NIASPAN 500 MG  TBCR (NIACIN (ANTIHYPERLIPIDEMIC)) Take 1 tab by mouth at bedtime   Updated Prior Medication List: ALBUTEROL 90 MCG/ACT  AERS (ALBUTEROL) one puff every 6 hours as needed for shortness of breath ASPIRIN 81 MG  TBEC (ASPIRIN) one tab daily NORVASC 10 MG  TABS (AMLODIPINE BESYLATE) Take 1 tablet by mouth once a day COREG 3.125 MG TABS (CARVEDILOL) Take 1 tablet by mouth two times a day  Current Allergies (reviewed today): No known allergies     Risk Factors: Tobacco use:  current    Year started:  60 yrs ago    Cigarettes:  Yes -- 1.5 pack(s) per day Passive smoke exposure:  yes HIV high-risk behavior:  no Caffeine use:  3 drinks per day Alcohol use:  no Exercise:  no Seatbelt use:  100 % Sun Exposure:  occasionally   Review of Systems      See HPI  CV      Denies chest pain or discomfort, difficulty breathing at night, palpitations, and shortness of breath with exertion.  Resp      Complains of cough, coughing up blood, and sputum productive.      Denies shortness of breath and wheezing.  GU      Denies dysuria, urinary frequency, and urinary hesitancy.     Impression & Recommendations:  Problem # 1:  ESSENTIAL HYPERTENSION, BENIGN (ICD-401.1) Zachary Santos continues to have high blood pressure. I suggested increasing his coreg, or potentially starting a new medication, but he  declined, wanting to manage his hemoptysis first. HIs claritin D for his congestion will increase his blood pressure in the short term, but at his next visit, if he is off that medication, I would increase his coreg, and based on his PMH, consider starting a low dose ace-inhibitor.  His updated medication list for this problem includes:    Norvasc 10 Mg Tabs (Amlodipine besylate) .[name]... Take 1 tablet by mouth once a day    Coreg 3.125 Mg Tabs (Carvedilol) .[name]... Take 1 tablet by mouth two times a day  BP today: 165/91 Prior BP: 143/78 (04/30/2008)  Labs Reviewed: Creat: 1.34 (02/28/2008)  Orders: T-Urine Microalbumin w/creat. ratio ([id number] / [id number])   Problem # 2:  DYSLIPIDEMIA (ICD-272.4) Zachary Santos refuses to take niaspan. I will check a FLP and if LDL is over 100, consider treatment. He does not have DM, CAD, or other reasons to have a goal of less then 70, thoug he is a smoker with CRI, making a target below 100 advisable. It is difficult to get Zachary Santos to take new medications, as confirmed by his wife because he wants to let nature take its course and does not want to live to 75 years old. I informed him that he may make it to a healthy 89 if he quits smoking and starts on medications, and after that he seemed more amenable to statin therapy if needed. The following medications were removed from the medication list:    Niaspan 500 Mg Tbcr (Niacin (antihyperlipidemic)) .[name]... Take 1 tab by mouth at bedtime  Orders: T-Comprehensive Metabolic Panel [id number]) T-Lipid Profile ([id number])   Problem # 3:  HEMOPTYSIS (ICD-786.3) WIll check an CXR and go from there. He has not been losing weight, having night sweats, though he may be having some mild increased shortness of breath per his wife, he denies it. At this point, if the CXR is negative and his symptoms persist, a Spiral CT scan is worth considering to better evaluate his hemoptysis if it does not resolve with treatment of his  possible sinusitis with the claritin. He has already been ruled out for TB for this same problem one year ago. Orders: T-CBC w/Diff ([id number]) CXR- 2view (CXR)   Problem # 4:  COPD (ICD-496) He states that he is breathing okay, and declines specific breathing tests, in addition to smoking cessation. He was clear with me that there was no way that he would ever consider quitting smoking. This apparently includes if he finds out he has cancer, though  I told him then it would be to late. I stated that even if he quits now, over the next several years his risks of developing new illnesses would decrease, but he was not impressed. A total of 30 minutes was spent with Zachary Santos. His updated medication list for this problem includes:    Albuterol 90 Mcg/act Aers (Albuterol) ..... One puff every 6 hours as needed for shortness of breath   Complete Medication List: 1)  Albuterol 90 Mcg/act Aers (Albuterol) .... One puff every 6 hours as needed for shortness of breath 2)  Aspirin 81 Mg Tbec (Aspirin) .... One tab daily 3)  Norvasc 10 Mg Tabs (Amlodipine besylate) .... Take 1 tablet by mouth once a day 4)  Coreg 3.125 Mg Tabs (Carvedilol) .... Take 1 tablet by mouth two times a day 5)  Claritin-d 24 Hour 10-240 Mg Xr24h-tab (Loratadine-pseudoephedrine) .... Take 1 tablet by mouth once a day for 14 days   Patient Instructions: 1)  Please return to the clinic in early April to follow up with Dr. Onalee Hua. At that time we will see if your blood pressure is improved, and follow up on the labs we have drawn. 2)  If your x-ray or labwork is abnormal, we will call you. If it is okay, we will not call you. 3)  Please note the new medication claritin D and coreg. THe coreg is for blood pressure, and the claritin D is for congestion. 4)  I would strongly recommend you quit smoking. You chance of making it to a healthy 89 would be high if you stop smoking.   Prescriptions: ALBUTEROL 90 MCG/ACT  AERS  (ALBUTEROL) one puff every 6 hours as needed for shortness of breath  #1 month supp x 3   Entered and Authorized by:   Valetta Close MD   Signed by:   Valetta Close MD on 12/18/2008   Method used:   Electronically to        [facility] [address] (retail)       [address]       [place], [place]  [address]       Ph: [phone]       Fax: [phone]   RxID:   [id number] NORVASC 10 MG  TABS (AMLODIPINE BESYLATE) Take 1 tablet by mouth once a day  #30 x 11   Entered and Authorized by:   Valetta Close MD   Signed by:   Valetta Close MD on 12/18/2008   Method used:   Electronically to        [facility] [address] (retail)       [address]       [place], [place]  [address]       Ph: [phone]       Fax: [phone]   RxID:   [id number] CLARITIN-D 24 HOUR 10-240 MG XR24H-TAB (LORATADINE-PSEUDOEPHEDRINE) Take 1 tablet by mouth once a day for 14 days  #14 x 1   Entered and Authorized by:   Valetta Close MD   Signed by:   Valetta Close MD on 12/18/2008   Method used:   Electronically to        [facility] [address] (retail)       [address]       [place], [place]  [address]  Ph: [phone]       Fax: [phone]   RxID:   [id number] COREG 3.125 MG TABS (CARVEDILOL) Take 1 tablet by mouth two times a day  #62 x prn   Entered and Authorized by:   Valetta Close MD   Signed by:   Valetta Close MD on 12/18/2008   Method used:   Electronically to        [facility] [address] (retail)       [address]       [place], [place]  [address]       Ph: [phone]       Fax: [phone]   RxID:   [id number]

## 2010-11-17 NOTE — Letter (Signed)
Summary: Handout Printed  Printed Handout:  - *Patient Instructions 

## 2010-11-17 NOTE — Miscellaneous (Signed)
Summary: HIPAA Restrictions  HIPAA Restrictions   Imported By: Florinda Marker 12/18/2008 15:07:22  _____________________________________________________________________  External Attachment:    Type:   Image     Comment:   External Document

## 2010-11-17 NOTE — Assessment & Plan Note (Signed)
Summary: EST-1 MONTH ROUTINE CHECKUP/CH   Vital Signs:  Patient profile:   75 year old male Height:      72 inches (182.88 cm) Weight:      195.4 pounds (88.82 kg) BMI:     26.60 Temp:     97.0 degrees F (36.11 degrees C) oral Pulse rate:   68 / minute Pulse (ortho):   63 / minute BP sitting:   136 / 77  (right arm) BP standing:   124 / 75  Vitals Entered By: Chinita Pester RN (Mar 04, 2010 9:06 AM)  Serial Vital Signs/Assessments:  Time      Position  BP       Pulse  Resp  Temp     By 10:10 AM  Lying RA  145/83   57                    Chinita Pester RN 10:10 AM  Sitting   128/77   60                    Chinita Pester RN 10:10 AM  Standing  124/75   63                    Chinita Pester RN   Immunization History:  Pneumovax Immunization History:    Pneumovax:  historical (06/05/2006)  CC: F/U visit.  Med refills.   Poison oak on face.  Feeling tired; more than usual. Is Patient Diabetic? No Pain Assessment Patient in pain? yes     Location: left foot Intensity: 2 Type: aching Onset of pain  Intermittent;  dropped a log on his foot. Nutritional Status BMI of 25 - 29 = overweight  Have you ever been in a relationship where you felt threatened, hurt or afraid?No   Does patient need assistance? Functional Status Self care Ambulation Normal   CC:  F/U visit.  Med refills.   Poison oak on face.  Feeling tired; more than usual..  History of Present Illness: Mr Willetts is a 75 yo man with PMH as outlined below.  He is here for routine f/u.  States his blood pressure is better with new bood pressure medicine (HCTZ and doxazosin of BPH).  He reports though that since this he gets worn out.  States that walking in from the parking lot he feels as he has been working all day.  He denies any other symptoms such as cardiac or pulmonary symptoms.  Depression screen negative.  Currently getting up twice per night to urinate.      Depression History:      The patient denies a depressed  mood most of the day and a diminished interest in his usual daily activities.  Positive alarm features for depression include fatigue (loss of energy).  However, he denies significant weight loss, significant weight gain, insomnia, hypersomnia, psychomotor agitation, psychomotor retardation, feelings of worthlessness (guilt), impaired concentration (indecisiveness), and recurrent thoughts of death or suicide.         Preventive Screening-Counseling & Management  Alcohol-Tobacco     Alcohol drinks/day: 0     Smoking Status: current     Smoking Cessation Counseling: yes     Packs/Day: 1.0 or less     Year Started: 60 yrs ago     Passive Smoke Exposure: yes  Caffeine-Diet-Exercise     Caffeine use/day: 3     Does Patient Exercise: no  Current Medications (verified): 1)  Aspirin 81 Mg  Tbec (Aspirin) .... One Tab Daily 2)  Norvasc 10 Mg  Tabs (Amlodipine Besylate) .... Take 1 Tablet By Mouth Once A Day 3)  Hydrochlorothiazide 25 Mg Tabs (Hydrochlorothiazide) .... Take 1 Tablet By Mouth Once A Day 4)  Doxazosin Mesylate 1 Mg Tabs (Doxazosin Mesylate) .... Take 1 Tab By Mouth At Bedtime 5)  Viagra 100 Mg Tabs (Sildenafil Citrate) .... Take 1/2 Tablet Before Sexual Intercourse.  Do Not Take More Than 1 Dose in A 24 Hour Period.  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  Current Smoker  Risk  Factors: Alcohol Use: 0 (03/04/2010) Caffeine Use: 3 (03/04/2010) Exercise: no (03/04/2010)  Risk Factors: Smoking Status: current (03/04/2010) Packs/Day: 1.0 or less (03/04/2010) Passive Smoke Exposure: yes (03/04/2010)  Social History: Packs/Day:  1.0 or less  Review of Systems      See HPI  Physical Exam  General:  alert, well-nourished, appropriate dress, normal appearance, and cooperative to examination.   Head:  normocephalic and atraumatic.   Eyes:  pupils equal, pupils round, and pupils reactive to light.  anicteric Mouth:  edentulous with thickened bone underlying gums Neck:  supple, no carotid bruits,  no JVD Lungs:  normal respiratory effort, no accessory muscle use, normal breath sounds (though distant), no crackles, and no wheezes.   Heart:  normal rate, regular rhythm, no murmur, no gallop, no rub, no JVD, no HJR, no lifts, no thrills, and PMI normal.   Abdomen:  soft, non-tender, normal bowel sounds, no distention, no masses, no guarding, no hepatomegaly, and no splenomegaly.  No bruits. Msk:  paget's deformity in right lower extremity Extremities:  no edema Neurologic:  alert & oriented X3, cranial nerves II-XII intact, strength normal in all extremities, and gait normal.   Psych:  Oriented X3, memory intact for recent and remote, normally interactive, good eye contact, not anxious appearing, and not depressed appearing.     Impression & Recommendations:  Problem # 1:  FATIGUE (ICD-780.79) Labs w/in last 2 years without etiology No signs or symptoms of cardiopulmonary disease, pt does smoke but lung exam unremarkable. Will check ECG, may consider echo if q waves or other abnormality present, though no CHF or pHTN findings on exam Will check labs below to r/o anemia, hypothyroid, B12 def or electrolyte abnormalities with new HCTZ Consider sleep study if persists as pt snores but wants to await above results Consider PFTs and/or stress test based on above results  as well.  Orders: 12 Lead EKG (12 Lead EKG) T-Basic Metabolic Panel [id number]) T-CBC w/Diff [phone] [id number]) T-TSH [id number] [id number]) T-Vitamin B12 [id number]) T-Magnesium [id number]) T-Phosphorus ([id number])  Problem # 2:  ESSENTIAL HYPERTENSION, BENIGN (ICD-401.1) At goal Check orthostatics due to reported fatigue, though no symptoms of orthostasis See above  His updated medication list for this problem includes:    Norvasc 10 Mg Tabs (Amlodipine besylate) .Marland KitchenMarland KitchenMarland KitchenMarland Kitchen  Take 1 tablet by mouth once a day    Hydrochlorothiazide 25 Mg Tabs (Hydrochlorothiazide) .[name]... Take 1 tablet by mouth once a day    Doxazosin Mesylate 1 Mg Tabs (Doxazosin mesylate) .[name]... Take 1 tab by mouth at bedtime  BP today: 136/77 Prior BP: 175/82 (02/02/2010)  Labs Reviewed: K+: 5.0 (02/02/2010) Creat: : 1.19 (02/02/2010)   Chol: 142 (02/02/2010)   HDL: 28 (02/02/2010)   LDL: 91 (02/02/2010)   TG: 117 (02/02/2010)  Problem # 3:  ERECTILE DYSFUNCTION, ORGANIC (ICD-607.84) Greatly improved with viagra  His updated medication list for this problem includes:    Viagra 100 Mg Tabs (Sildenafil citrate) .[name]... Take 1/2 tablet before sexual intercourse.  do not take more than 1 dose in a 24 hour period.  Problem # 4:  DYSLIPIDEMIA (ICD-272.4)  at goal  Labs Reviewed: SGOT: 17 (02/02/2010)   SGPT: 20 (02/02/2010)   HDL:28 (02/02/2010), 28 (12/18/2008)  LDL:91 (02/02/2010), 96 (12/18/2008)  Chol:142 (02/02/2010), 155 (12/18/2008)  Trig:117 (02/02/2010), 156 (12/18/2008)  Problem # 5:  BENIGN PROSTATIC HYPERTROPHY, HX OF (ICD-V13.8) Improved with doxazosin, c/w current regimen  Complete Medication List: 1)  Aspirin 81 Mg Tbec (Aspirin) .... One tab daily 2)  Norvasc 10 Mg Tabs (Amlodipine besylate) .... Take 1 tablet by mouth once a day 3)  Hydrochlorothiazide 25 Mg Tabs (Hydrochlorothiazide) .... Take 1 tablet by mouth once a day 4)  Doxazosin Mesylate 1 Mg Tabs (Doxazosin mesylate) .... Take 1 tab by mouth at  bedtime 5)  Viagra 100 Mg Tabs (Sildenafil citrate) .... Take 1/2 tablet before sexual intercourse.  do not take more than 1 dose in a 24 hour period.  Patient Instructions: 1)  Please schedule a follow-up appointment in 3 months. 2)  Will check an electrocardiogram and labs as discussed, if there are any problems we will call. 3)  If your symptoms worsen any, please call clinic and we will see you sooner. 4)  Otherwise, I think you are doing quite well overall as we discussed. Prescriptions: DOXAZOSIN MESYLATE 1 MG TABS (DOXAZOSIN MESYLATE) Take 1 tab by mouth at bedtime  #30 x 5   Entered and Authorized by:   Mariea Stable MD   Signed by:   Mariea Stable MD on 03/04/2010   Method used:   Electronically to        [facility] [address] (retail)       [address]       [place], [place]  [address]       Ph: [phone]       Fax: [phone]   RxID:   [id number] HYDROCHLOROTHIAZIDE 25 MG TABS (HYDROCHLOROTHIAZIDE) Take 1 tablet by mouth once a day  #30 x 5   Entered and Authorized by:   Mariea Stable MD   Signed by:   Mariea Stable MD on 03/04/2010   Method used:   Electronically to        [facility] [address] (retail)       [address]       [place], [place]  [address]       Ph: [phone]       Fax: [phone]   RxID:   [id number] NORVASC 10 MG  TABS (AMLODIPINE BESYLATE) Take 1 tablet by mouth once a day  #30 x 5   Entered and Authorized by:   Mariea Stable MD  Signed by:   Mariea Stable MD on 03/04/2010   Method used:   Electronically to        [facility] [address] (retail)       [address]       [place], [place]  [address]       Ph: [phone]       Fax: [phone]   RxID:   [id number]   Prevention & Chronic Care Immunizations   Influenza vaccine: Not documented   Influenza vaccine deferral:  Deferred  (03/04/2010)    Tetanus booster: Not documented    Pneumococcal vaccine: Historical  (06/05/2006)    H. zoster vaccine: Not documented   H. zoster vaccine deferral: Not available  (03/04/2010)  Colorectal Screening   Hemoccult: Not documented   Hemoccult action/deferral: Refused  (02/02/2010)    Colonoscopy: Not documented   Colonoscopy action/deferral: Refused  (02/02/2010)  Other Screening   PSA: Not documented   PSA action/deferral: Discussed-PSA declined  (02/02/2010)   Smoking status: current  (03/04/2010)   Smoking cessation counseling: yes  (03/04/2010)  Lipids   Total Cholesterol: 142  (02/02/2010)   Lipid panel action/deferral: Lipid Panel ordered   LDL: 91  (02/02/2010)   LDL Direct: Not documented   HDL: 28  (02/02/2010)   Triglycerides: 117  (02/02/2010)    SGOT (AST): 17  (02/02/2010)   BMP action: Ordered   SGPT (ALT): 20  (02/02/2010)   Alkaline phosphatase: 341  (02/02/2010)   Total bilirubin: 0.5  (02/02/2010)    Lipid flowsheet reviewed?: Yes   Progress toward LDL goal: At goal  Hypertension   Last Blood Pressure: 136 / 77  (03/04/2010)   Serum creatinine: 1.19  (02/02/2010)   BMP action: Ordered   Serum potassium 5.0  (02/02/2010)    Hypertension flowsheet reviewed?: Yes   Progress toward BP goal: At goal  Self-Management Support :    Patient will work on the following items until the next clinic visit to reach self-care goals:     Medications and monitoring: take my medicines every day, bring all of my medications to every visit  (03/04/2010)     Eating: eat foods that are low in salt, eat baked foods instead of fried foods  (03/04/2010)     Activity: take a 30 minute walk every day  (03/04/2010)    Hypertension self-management support: Education handout, Resources for patients handout, Written self-care plan  (03/04/2010)   Hypertension self-care plan printed.   Hypertension education handout printed    Lipid self-management  support: Education handout, Resources for patients handout, Written self-care plan  (03/04/2010)   Lipid self-care plan printed.   Lipid education handout printed      Resource handout printed.  Process Orders Check Orders Results:     [facility]: Check successful Tests Sent for requisitioning (Mar 04, 2010 12:13 PM):     03/04/2010: [facility] -- T-Basic Metabolic Panel [id number] (signed)     03/04/2010: [facility] -- T-CBC w/Diff [[id number]] (signed)     03/04/2010: [facility] -- T-TSH [phone] (signed)     03/04/2010: [facility] -- T-Vitamin B12 [id number] (signed)     03/04/2010: [facility] -- T-Magnesium [[id number]] (signed)     03/04/2010: [facility] -- T-Phosphorus [phone] (signed)

## 2010-11-17 NOTE — Assessment & Plan Note (Signed)
Summary: EST-1 MONTH F/U VISIT/CH   Vital Signs:  Patient profile:   74 year old male Height:      72 inches Weight:      197.04 pounds BMI:     26.82 O2 Sat:      97 % on Room air Temp:     96.7 degrees F oral Pulse rate:   80 / minute BP sitting:   145 / 75  (right arm) Cuff size:   regular  Vitals Entered By: Angelina Ok RN (September 02, 2010 3:12 PM)  O2 Flow:  Room air Is Patient Diabetic? No Pain Assessment Patient in pain? no      Nutritional Status BMI of 25 - 29 = overweight  Have you ever been in a relationship where you felt threatened, hurt or afraid?No   Does patient need assistance? Functional Status Self care Ambulation Normal Comments Check up.  Stress test.   History of Present Illness: Zachary Santos is a 75 yo man with PMH as outlined in chart.  He is here for follow up of his fatigue/DOE.  Please refer to last visit for more details on the matter.  We decided to check PFTs and an Echo.  Today he reports that symptoms are the same but has had some "burning" over left chest with exertion.  He states this feels like "when you are young and finished running very fast and feel this burning in your chest".  It only lasts 3 minutes or so and resolves completely with rest.  3/10 in severity.  No radiation, located over left upper chest.  No other symptoms such as SOB, lightheadedness, visual changes, n/v, or diaphoresis.   Depression History:      The patient denies a depressed mood most of the day and a diminished interest in his usual daily activities.         Preventive Screening-Counseling & Management  Alcohol-Tobacco     Alcohol drinks/day: 0     Smoking Status: current     Smoking Cessation Counseling: yes     Packs/Day: 1.0 or less     Year Started: 60 yrs ago     Passive Smoke Exposure: yes  Allergies: No Known Drug Allergies  Past History:  Past Surgical History: Last updated: 02/04/2010 cervical spine surgery trigeminal neuralgia with  surgery in [place] laparscopic chole appendectomy  Social History: Last updated: 02/02/2010 He is married and a retired Naval architect.  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  Current Smoker  Risk Factors: Alcohol Use: 0 (09/02/2010) Caffeine Use: 3 (07/29/2010) Exercise: no (07/29/2010)  Risk Factors: Smoking Status: current (09/02/2010) Packs/Day: 1.0 or less (09/02/2010) Passive Smoke Exposure: yes (09/02/2010)  Past Medical History: Chronic Fatigue      -02/2008:  CBC, CMP/Mg/Phos, TSH/T4, vit B12, testosterone all wnl (ESR 27)      -02/2010:  ECG with SB @ 57/min      -07/2010:  Echo and PFTs ordered.....consider stress test if negative. ? Stable Angina      -09/02/10:  will obtain above and order Stress test COPD      -PFTs done > 10 years ago tobacco abuse HTN Paget's disease      -RLE bony deformity      -Elevated Alk Phos (300s)      -No bone scan in our records Acute renal failure (after hospital discharge on HCTZ and ACEI)  Review of Systems      See HPI  Physical  Exam  General:  alert, well-nourished, appropriate dress, normal appearance, and cooperative to examination.   Lungs:  normal respiratory effort and no accessory muscle use.   Neurologic:  alert & oriented X3 and gait normal.   Psych:  Oriented X3, memory intact for recent and remote, and normally interactive.     Impression & Recommendations:  Problem # 1:  ? of ANGINA, STABLE (ICD-413.9)  Pt reports new symptoms that are more suggestive of stable/classic angina On ASA Will start statin in light of the above, though LDL 91 Will add B-blocker as BP can definitely tolerate it Will provide as needed NTG SL and advise against concomitant use of viagra Will order exercise stress test Likely refer to cardiology subsequently  Labs Reviewed: SGOT: 17 (02/02/2010)   SGPT: 20 (02/02/2010)   HDL:28 (02/02/2010), 28 (12/18/2008)  LDL:91 (02/02/2010), 96 (12/18/2008)  Chol:142  (02/02/2010), 155 (12/18/2008)  Trig:117 (02/02/2010), 156 (12/18/2008)  His updated medication list for this problem includes:    Aspirin 81 Mg Tbec (Aspirin) ..... One tab daily    Norvasc 10 Mg Tabs (Amlodipine besylate) .[name]... Take 1 tablet by mouth once a day    Hydrochlorothiazide 25 Mg Tabs (Hydrochlorothiazide) .[name]... Take 1 tablet by mouth once a day    Doxazosin Mesylate 2 Mg Tabs (Doxazosin mesylate) .[name]... Take 1 tablet by mouth once a day    Metoprolol Succinate 25 Mg Xr24h-tab (Metoprolol succinate) .[name]... Take 1 tablet by mouth once a day    Nitrostat 0.4 Mg Subl (Nitroglycerin) .[name]... Apply 1 tablet sublingual as needed chest pain, may repeat every 5 minutes up to 3 doses  Orders: Cardiology Other (Cardiology Other)  Problem # 2:  DYSLIPIDEMIA (ICD-272.4)  See above Start simvastatin 20mg  at bedtime Recheck lipids and LFTs in 3 months or so  His updated medication list for this problem includes:    Simvastatin 20 Mg Tabs (Simvastatin) .[name]... Take 1 tab by mouth at bedtime  Labs Reviewed: SGOT: 17 (02/02/2010)   SGPT: 20 (02/02/2010)   HDL:28 (02/02/2010), 28 (12/18/2008)  LDL:91 (02/02/2010), 96 (12/18/2008)  Chol:142 (02/02/2010), 155 (12/18/2008)  Trig:117 (02/02/2010), 156 (12/18/2008)  Problem # 3:  ESSENTIAL HYPERTENSION, BENIGN (ICD-401.1) See above Will initiate metoprolol to help reduce onset of ? angina  >40 minutes face to face  His updated medication list for this problem includes:    Norvasc 10 Mg Tabs (Amlodipine besylate) .[name]... Take 1 tablet by mouth once a day    Hydrochlorothiazide 25 Mg Tabs (Hydrochlorothiazide) .[name]... Take 1 tablet by mouth once a day    Doxazosin Mesylate 2 Mg Tabs (Doxazosin mesylate) .[name]... Take 1 tablet by mouth once a day    Metoprolol Succinate 25 Mg Xr24h-tab (Metoprolol succinate) .[name]... Take 1 tablet by mouth once a day  BP today: 145/75 Prior BP: 142/80 (07/29/2010)  Labs Reviewed: K+: 4.0 (03/04/2010) Creat: : 1.22  (03/04/2010)   Chol: 142 (02/02/2010)   HDL: 28 (02/02/2010)   LDL: 91 (02/02/2010)   TG: 117 (02/02/2010)  Problem # 4:  ERECTILE DYSFUNCTION, ORGANIC (ICD-607.84) Informed pt that viagra cannot be used in setting of as needed NTG use due to potential for severe hypotension and he voiced understanding  His updated medication list for this problem includes:    Viagra 100 Mg Tabs (Sildenafil citrate) .[name]... Take 1/2 tablet before sexual intercourse.  do not take more than 1 dose in a 24 hour period.  Complete Medication List: 1)  Aspirin 81 Mg Tbec (Aspirin) .... One tab daily  2)  Norvasc 10 Mg Tabs (Amlodipine besylate) .... Take 1 tablet by mouth once a day 3)  Hydrochlorothiazide 25 Mg Tabs (Hydrochlorothiazide) .... Take 1 tablet by mouth once a day 4)  Doxazosin Mesylate 2 Mg Tabs (Doxazosin mesylate) .... Take 1 tablet by mouth once a day 5)  Viagra 100 Mg Tabs (Sildenafil citrate) .... Take 1/2 tablet before sexual intercourse.  do not take more than 1 dose in a 24 hour period. 6)  Metoprolol Succinate 25 Mg Xr24h-tab (Metoprolol succinate) .... Take 1 tablet by mouth once a day 7)  Simvastatin 20 Mg Tabs (Simvastatin) .... Take 1 tab by mouth at bedtime 8)  Nitrostat 0.4 Mg Subl (Nitroglycerin) .... Apply 1 tablet sublingual as needed chest pain, may repeat every 5 minutes up to 3 doses  Patient Instructions: 1)  Please schedule a follow-up appointment in 2 weeks, please schedule with Dr. Onalee Hua 2)  Continue aspirin 81mg  daily 3)  Start medications below. 4)  Have prescribed nitroglycerin below as needed chest pain, however, cannot use viagra with use of nitroglycerin. 5)  If you have any further problems, call clinic or 911 if chest pain does not resolve within 5 minutes. Prescriptions: NITROSTAT 0.4 MG SUBL (NITROGLYCERIN) apply 1 tablet sublingual as needed chest pain, may repeat every 5 minutes up to 3 doses  #10 x 0   Entered and Authorized by:   Mariea Stable MD   Signed  by:   Mariea Stable MD on 09/02/2010   Method used:   Electronically to        [facility] [address] (retail)       [address]       [place], [place]  [address]       Ph: [phone]       Fax: [phone]   RxID:   [id number] SIMVASTATIN 20 MG TABS (SIMVASTATIN) Take 1 tab by mouth at bedtime  #30 x 3   Entered and Authorized by:   Mariea Stable MD   Signed by:   Mariea Stable MD on 09/02/2010   Method used:   Electronically to        [facility] [address] (retail)       [address]       [place], [place]  [address]       Ph: [phone]       Fax: [phone]   RxID:   [id number] METOPROLOL SUCCINATE 25 MG XR24H-TAB (METOPROLOL SUCCINATE) Take 1 tablet by mouth once a day  #30 x 3   Entered and Authorized by:   Mariea Stable MD   Signed by:   Mariea Stable MD on 09/02/2010   Method used:   Electronically to        [facility] [address] (retail)       [address]       [place], [place]  [address]       Ph: [phone]       Fax: [phone]   RxID:   [id number]    Orders Added: 1)  Cardiology Other [Cardiology Other] 2)  Est. Patient Level IV [517[address]]    Prevention & Chronic Care Immunizations   Influenza vaccine: Not documented   Influenza vaccine deferral: Refused  (09/02/2010)    Tetanus booster: Not documented   Td booster deferral: Deferred  (  09/02/2010)    Pneumococcal vaccine: Historical  (06/05/2006)    H. zoster vaccine: Not documented   H. zoster vaccine deferral: Not available  (03/04/2010)  Colorectal Screening   Hemoccult: Not documented   Hemoccult action/deferral: Refused  (02/02/2010)    Colonoscopy: Not documented   Colonoscopy action/deferral: Refused  (02/02/2010)  Other Screening   PSA: Not documented   PSA action/deferral: Discussed-PSA declined   (02/02/2010)   Smoking status: current  (09/02/2010)   Smoking cessation counseling: yes  (09/02/2010)  Lipids   Total Cholesterol: 142  (02/02/2010)   Lipid panel action/deferral: Lipid Panel ordered   LDL: 91  (02/02/2010)   LDL Direct: Not documented   HDL: 28  (02/02/2010)   Triglycerides: 117  (02/02/2010)    SGOT (AST): 17  (02/02/2010)   BMP action: Ordered   SGPT (ALT): 20  (02/02/2010)   Alkaline phosphatase: 341  (02/02/2010)   Total bilirubin: 0.5  (02/02/2010)    Lipid flowsheet reviewed?: Yes   Progress toward LDL goal: At goal  Hypertension   Last Blood Pressure: 145 / 75  (09/02/2010)   Serum creatinine: 1.22  (03/04/2010)   BMP action: Ordered   Serum potassium 4.0  (03/04/2010)    Hypertension flowsheet reviewed?: Yes   Progress toward BP goal: Unchanged  Self-Management Support :   Personal Goals (by the next clinic visit) :      Personal blood pressure goal: 140/90  (07/29/2010)     Personal LDL goal: 100  (07/29/2010)    Patient will work on the following items until the next clinic visit to reach self-care goals:     Medications and monitoring: take my medicines every day, bring all of my medications to every visit  (09/02/2010)     Eating: drink diet soda or water instead of juice or soda, eat more vegetables, use fresh or frozen vegetables, eat foods that are low in salt, eat baked foods instead of fried foods, eat fruit for snacks and desserts, limit or avoid alcohol  (09/02/2010)     Activity: take a 30 minute walk every day  (09/02/2010)    Hypertension self-management support: Written self-care plan, Education handout, Pre-printed educational material, Resources for patients handout  (09/02/2010)   Hypertension self-care plan printed.   Hypertension education handout printed    Lipid self-management support: Written self-care plan, Education handout, Pre-printed educational material, Resources for patients handout  (09/02/2010)   Lipid  self-care plan printed.   Lipid education handout printed      Resource handout printed.

## 2010-11-17 NOTE — Progress Notes (Signed)
Summary: PREVENTIVE COLONOSCOPY  Phone Note Outgoing Call   Summary of Call: Checked EMR and [facility]. Could not find where this patient kept his appoinment with Dr. Marge Duncans office for a Colonoscopy.   Looks like patient was supposed to go back in 2009 and never had one done.  Checked patient's current insurance and he has Psychologist, counselling through [employer]. Initial call taken by: Shon Hough,  September 02, 2010 1:27 PM

## 2010-11-17 NOTE — Assessment & Plan Note (Signed)
Summary: new hfu-bmp hypertension/cfb   Vital Signs:  Patient Profile:   75 Years Old Male Height:     72 inches (182.88 cm) Weight:      188.31 pounds (85.60 kg) BMI:     25.63 BSA:     2.08 O2 Sat:      95 % Temp:     95.4 degrees F oral Pulse rate:   84 / minute Resp:     20 per minute BP sitting:   130 / 68  (right arm)  Pt. in pain?   no  Vitals Entered By: Marin Roberts RN (February 14, 2008 3:09 PM)              Is Patient Diabetic? No     Chief Complaint:  hfu/ htn and coughing up blood.  History of Present Illness: This is a 75 year old male with a history of of COPD, PNA, HTN and tobacco abuse who is coming in for a hospital follow up. His cough, SOB, and hemoptysis has resolved. He is feeling well. His exercise tolerance has improved. He is finished hsi course of ABx. He denies any CP at rest or on exertion. He developed a diffuse itchy skin rash since hospitalization. He noticed it on his arm originally and it spread to his back, chest and legs after leaving [facility]. He has used a steroid cream and benadryl cream which has signficantly improved the rash's itchiness and redness.     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 He has largely ignored his medical care until recently.    Risk Factors:  Tobacco use:  current    Year started:  60 yrs ago    Cigarettes:  Yes -- 1.5 pack(s) per day    Counseled to  quit/cut down tobacco use:  yes Passive smoke exposure:  yes HIV high-risk behavior:  no Caffeine use:  3 drinks per day Alcohol use:  no Exercise:  no Seatbelt use:  100 % Sun Exposure:  occasionally   Review of Systems  The patient denies anorexia, fever, weight loss, weight gain, hoarseness, chest pain, syncope, dyspnea on exhertion, prolonged cough, and hemoptysis.     Physical Exam  General:     alert, well-developed, and well-nourished.   Head:     normocephalic.   Eyes:     vision grossly intact.   Neck:     supple.   Lungs:     normal respiratory effort, no dullness, no fremitus, no crackles, and no wheezes.   Heart:     normal rate, regular rhythm, and no murmur.   Abdomen:     soft, non-tender, normal bowel sounds, and no masses.   Extremities:     no peripheral edema, cyanosis or clubbing.  Neurologic:     alert & oriented X3, cranial  nerves II-XII intact, and strength normal in all extremities.   Skin:     turgor normal.  He has a rash on his back, chest abdomen, arms and legs bilaterally down to his knees. It is comprsied of erythematous, itchy papules.  Psych:     Oriented X3 and memory intact for recent and remote.      Impression & Recommendations:  Problem # 1:  COPD (ICD-496) His exacerbation has resolved. He finished his course of ABx. He has not needed his albuterol inhaler. He has cut back on smoking. He will need close fu in regards to his COPD. His hemptysis has resolved. His CT was negative for malignancy in the hospital. This was most likely due to bronchitis.  His updated medication list for this problem includes:    Albuterol 90 Mcg/act Aers (Albuterol) ..... One puff every 6 hours as needed for shortness of breath   Problem # 2:  ESSENTIAL HYPERTENSION, BENIGN (ICD-401.1) His HCTZ and lisinopril is controlling his BP well. I will hold his lisinopril given his creatinine and his rash. I will recheck his BP at his next vsit. I will use  norvasc if needed at his next visit.  The following medications were removed from the medication list:    Norvasc 5 Mg Tabs (Amlodipine besylate) ..... One tab by mouth daily  His updated medication list for this problem includes:    Hydrochlorothiazide 25 Mg Tabs (Hydrochlorothiazide) ..... One tab by mouth daily    Lisinopril 20 Mg Tabs (Lisinopril) ..... One tab by mouth daily  Orders: T-Basic Metabolic Panel [id number])   Problem # 3:  RENAL INSUFFICIENCY, ACUTE (ICD-585.9) This is likely caused by lisinopril. I will hold this medication. He may have RAS.  I will check and renin/aldo ratio at his next visit and recheck his BMP.   Problem # 4:  CONTACT DERMATITIS&OTH ECZEMA DUE OTH SPEC AGENT (ICD-692.89) He has a diffuse erythematous rash that started during hospitalization. The rash has improved. It could be caused by one of his new meds like lisinopril, HCTZ, or from the soap he used in the hospital. Apparently he has had rashed before frequently because of new soaps. I recommended holding the lisinopril, benadryl for itching and rechecking his rash in 2 weeks. He is already taking an OTC steroid cream.   Complete Medication List: 1)  Hydrochlorothiazide 25 Mg Tabs (Hydrochlorothiazide) .... One tab by mouth daily 2)  Albuterol 90 Mcg/act Aers (Albuterol) .... One puff every 6 hours as needed for shortness of breath 3)  Nicotine 14 Mg/24hr Pt24 (Nicotine) .... One patch daily 4)  Aspirin 81 Mg Tbec (Aspirin) .... One tab daily 5)  Lisinopril 20 Mg Tabs (Lisinopril) .... One tab by mouth daily  Other Orders: Future Orders: Gastroenterology Referral (GI) ... 02/20/2008   Patient Instructions: 1)  Please schedule a follow-up appointment in 2 weeks. 2)  Hold your lisinopril until then.  3)  Please call us if our breathing worsens.  4)  Please keep tring to quit smoking and call if you need additional nicotine patches.  5)  We will call if your lab work is abnormal.  6)  We  will call ou with your appointment with a GI doctor for a colonscopy.    Prescriptions: LISINOPRIL 20 MG  TABS (LISINOPRIL) one tab by mouth daily  #30 x 11   Entered and Authorized by:   Lollie Sails MD   Signed by:   Lollie Sails MD on 02/14/2008  Method used:   Print then Give to Patient   RxID:   [id number] NICOTINE 14 MG/24HR  PT24 (NICOTINE) one patch daily  #14 x 0   Entered and Authorized by:   Lollie Sails MD   Signed by:   Lollie Sails MD on 02/14/2008   Method used:   Print then Give to Patient   RxID:   [id number] HYDROCHLOROTHIAZIDE 25 MG  TABS (HYDROCHLOROTHIAZIDE) one tab by mouth daily  #30 x 11   Entered and Authorized by:   Lollie Sails MD   Signed by:   Lollie Sails MD on 02/14/2008   Method used:   Print then Give to Patient   RxID:   [id number] NICOTINE 14 MG/24HR  PT24 (NICOTINE) one patch daily  #14 x 0   Entered and Authorized by:   Lollie Sails MD   Signed by:   Lollie Sails MD on 02/14/2008   Method used:   Print then Give to Patient   RxID:   [id number] HYDROCHLOROTHIAZIDE 25 MG  TABS (HYDROCHLOROTHIAZIDE) one tab by mouth daily  #30 x 11   Entered and Authorized by:   Lollie Sails MD   Signed by:   Lollie Sails MD on 02/14/2008   Method used:   Print then Give to Patient   RxID:   [id number] LISINOPRIL 20 MG  TABS (LISINOPRIL) one tab by mouth daily  #30 x 11   Entered and Authorized by:   Lollie Sails MD   Signed by:   Lollie Sails MD on 02/14/2008   Method used:   Print then Give to Patient   RxID:   [id number]  ]

## 2010-11-17 NOTE — Letter (Signed)
Summary: Appointment - Missed  [facility], Main Office  [address]   [place], [place] [address]   Phone: [phone]  Fax: [phone]     September 23, 2010 MRN: [id number]   [facility] [address] [place], [place]  [address]   Dear Mr. PENROD,  Our records indicate you missed your appointment on 09-11-2010 Dr. Antoine Poche.                                    It is very important that we reach you to reschedule this appointment. We look forward to participating in your health care needs. Please contact us at the number listed above at your earliest convenience to reschedule this appointment.     Sincerely,   Lorne Skeens  [facility] Scheduling Team

## 2010-11-17 NOTE — Progress Notes (Signed)
Summary: phone/gg  Phone Note Call from Patient   Caller: Patient Summary of Call: Pt called with c/o swelling and drainage from rt ear. Also itching and throbbing.  He was told he had otitis external Onset last week.  Has been using benadryl for itching.  wheeping has continued.  advised pt to go to [facility] for evaluation. We are unable to see in clinic today Initial call taken by: Merrie Roof RN,  April 06, 2010 11:26 AM  Follow-up for Phone Call        Thanks you.  It is unfortunate we do not have additional appts to accomodate these pts.   Follow-up by: Blanch Media MD,  April 06, 2010 12:08 PM

## 2010-11-17 NOTE — Assessment & Plan Note (Signed)
Summary: FU VISIT/DS   Vital Signs:  Patient profile:   75 year old male Height:      72 inches (182.88 cm) Weight:      192.1 pounds (88.82 kg) BMI:     26.60 Temp:     98.6 degrees F (37.00 degrees C) oral Pulse rate:   74 / minute BP sitting:   142 / 80  (left arm) Cuff size:   regular  Vitals Entered By: Theotis Barrio NT II (July 29, 2010 12:02 PM) CC: REFUSE FLU SHOT / REQUEST PNEUMONIA SHOT / ROUTINE OFFICE VISIT  Is Patient Diabetic? No Pain Assessment Patient in pain? no      Nutritional Status BMI of 25 - 29 = overweight  Have you ever been in a relationship where you felt threatened, hurt or afraid?No   Does patient need assistance? Functional Status Self care Ambulation Normal   CC:  REFUSE FLU SHOT / REQUEST PNEUMONIA SHOT / ROUTINE OFFICE VISIT .  History of Present Illness: Zachary Santos is a 75 yo man with PMH as outlined below.  He is here for routine follow up.  He is here complaining of exhaustion and lack of energy.  This is a chronic problem that has been worked up without etiology.  He mentions feeling wiped out with exertion.  Other day was with a friend and walked down hill and was not able to make it back up without stopping and resting.  Some SOB assosciated but no CP, dizziness, n/v, diaphoresis, near syncope.     Preventive Screening-Counseling & Management  Alcohol-Tobacco     Alcohol drinks/day: 0     Smoking Status: current     Smoking Cessation Counseling: yes     Packs/Day: 1.0 or less     Year Started: 60 yrs ago     Passive Smoke Exposure: yes  Caffeine-Diet-Exercise     Caffeine use/day: 3     Does Patient Exercise: no  Medications Prior to Update: 1)  Aspirin 81 Mg  Tbec (Aspirin) .... One Tab Daily 2)  Norvasc 10 Mg  Tabs (Amlodipine Besylate) .... Take 1 Tablet By Mouth Once A Day 3)  Hydrochlorothiazide 25 Mg Tabs (Hydrochlorothiazide) .... Take 1 Tablet By Mouth Once A Day 4)  Doxazosin Mesylate 1 Mg Tabs (Doxazosin  Mesylate) .... Take 1 Tab By Mouth At Bedtime 5)  Viagra 100 Mg Tabs (Sildenafil Citrate) .... Take 1/2 Tablet Before Sexual Intercourse.  Do Not Take More Than 1 Dose in A 24 Hour Period.  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  Current Smoker  Risk Factors: Alcohol Use: 0 (07/29/2010) Caffeine Use: 3 (07/29/2010) Exercise: no (07/29/2010)  Risk Factors: Smoking Status: current (07/29/2010) Packs/Day: 1.0 or less (07/29/2010) Passive Smoke Exposure: yes (07/29/2010)  Past Medical History: Chronic Fatigue      -02/2008:  CBC, CMP/Mg/Phos, TSH/T4, vit B12, testosterone all wnl (ESR 27)      -02/2010:  ECG with SB @ 57/min      -07/2010:  Echo and PFTs ordered.....consider stress test if negative. COPD      -PFTs done > 10 years ago tobacco abuse HTN Paget's disease      -RLE bony deformity      -  Elevated Alk Phos (300s)      -No bone scan in our records Acute renal failure (after hospital discharge on HCTZ and ACEI)  Review of Systems      See HPI  Physical Exam  General:  alert, well-nourished, appropriate dress, normal appearance, and cooperative to examination.   Eyes:  pupils equal, pupils round, and pupils reactive to light.  anicteric Lungs:  normal respiratory effort and no accessory muscle use.   Neurologic:  alert & oriented X3, cranial nerves II-XII intact, strength normal in all extremities, and gait normal.   Psych:  Oriented X3, memory intact for recent and remote, normally interactive, and good eye contact.     Impression & Recommendations:  Problem # 1:  BENIGN PROSTATIC HYPERTROPHY, HX OF (ICD-V13.8) Pt reports accidentally taking  doxazosin twice within a short time frame and noticing a marked improvement in urination. BP wnl Will increase to 2mg  at bedtime Advised to caution when getting out of bed to avoid orthostatic symptoms.  Problem # 2:  FATIGUE (ICD-780.79)  Please refer to prior note:  will check 2D echo given pronounced fatigue with exertion (?LV dysfunction, although no physical findings) PFTs to r/o pulmonary process (COPD given smoking hisotry)  will consider stress test based on above (? anginal equivalent)  Orders: PFT Basline & Lung Volume (PFT Baseline-Lung  V) PFT's Baseline w/ DLCO (PFT's Baseline-DLCO) 2 D Echo (2 D Echo)  Problem # 3:  DYSLIPIDEMIA (ICD-272.4) at goal  Labs Reviewed: SGOT: 17 (02/02/2010)   SGPT: 20 (02/02/2010)   HDL:28 (02/02/2010), 28 (12/18/2008)  LDL:91 (02/02/2010), 96 (12/18/2008)  Chol:142 (02/02/2010), 155 (12/18/2008)  Trig:117 (02/02/2010), 156 (12/18/2008)  Problem # 4:  ESSENTIAL HYPERTENSION, BENIGN (ICD-401.1) well controlled for age doxazosin increased per #1  His updated medication list for this problem includes:    Norvasc 10 Mg Tabs (Amlodipine besylate) .[name]... Take 1 tablet by mouth once a day    Hydrochlorothiazide 25 Mg Tabs (Hydrochlorothiazide) .[name]... Take 1 tablet by mouth once a day    Doxazosin Mesylate 2 Mg Tabs (Doxazosin mesylate) .[name]... Take 1 tablet by mouth once a day  BP today: 142/80 Prior BP: 124/75 (03/04/2010)  Labs Reviewed: K+: 4.0 (03/04/2010) Creat: : 1.22 (03/04/2010)   Chol: 142 (02/02/2010)   HDL: 28 (02/02/2010)   LDL: 91 (02/02/2010)   TG: 117 (02/02/2010)  Complete Medication List: 1)  Aspirin 81 Mg Tbec (Aspirin) .... One tab daily 2)  Norvasc 10 Mg Tabs (Amlodipine besylate) .... Take 1 tablet by mouth once a day 3)  Hydrochlorothiazide 25 Mg Tabs (Hydrochlorothiazide) .... Take 1 tablet by mouth once a day 4)  Doxazosin Mesylate 2 Mg Tabs (Doxazosin mesylate) .... Take 1 tablet by mouth once a day 5)  Viagra 100  Mg Tabs (Sildenafil citrate) .... Take 1/2 tablet before sexual intercourse.  do not take more than 1 dose in a 24 hour period.  Patient Instructions: 1)  Please schedule a follow-up appointment in 1 month. 2)  Will check tests discussed and re-evaluate in 1 month. 3)  If you have any problems before then, call clinic.  Prescriptions: DOXAZOSIN MESYLATE 2 MG TABS (DOXAZOSIN MESYLATE) Take 1 tablet by mouth once a day  #90 x 4   Entered and Authorized by:   Zachary Stable MD   Signed by:   Zachary Stable MD on 07/29/2010   Method used:   Electronically to        [facility] [address] (retail)  [address]       [place], [place]  [address]       Ph: [phone]       Fax: [phone]   RxID:   [id number] VIAGRA 100 MG TABS (SILDENAFIL CITRATE) take 1/2 tablet before sexual intercourse.  Do not take more than 1 dose in a 24 hour period.  #4 x 1   Entered and Authorized by:   Zachary Stable MD   Signed by:   Zachary Stable MD on 07/29/2010   Method used:   Electronically to        [facility] [address] (retail)       [address]       [place], [place]  [address]       Ph: [phone]       Fax: [phone]   RxID:   [id number] HYDROCHLOROTHIAZIDE 25 MG TABS (HYDROCHLOROTHIAZIDE) Take 1 tablet by mouth once a day  #90 x 4   Entered and Authorized by:   Zachary Stable MD   Signed by:   Zachary Stable MD on 07/29/2010   Method used:   Electronically to        [facility] [address] (retail)       [address]       [place], [place]  [address]       Ph: [phone]       Fax: [phone]   RxID:   [id number] NORVASC 10 MG  TABS (AMLODIPINE BESYLATE) Take 1 tablet by mouth once a day  #90 x 4   Entered and Authorized by:   Zachary Stable MD   Signed by:   Zachary Stable MD on 07/29/2010   Method used:    Electronically to        [facility] [address] (retail)       [address]       [place], [place]  [address]       Ph: [phone]       Fax: [phone]   RxID:   [id number] ASPIRIN 81 MG  TBEC (ASPIRIN) one tab daily  #100 x 11   Entered and Authorized by:   Zachary Stable MD   Signed by:   Zachary Stable MD on 07/29/2010   Method used:   Electronically to        [facility] [address] (retail)       [address]       [place], [place]  [address]       Ph: [phone]       Fax: [phone]   RxID:   [id number]    Prevention & Chronic Care Immunizations   Influenza vaccine: Not documented   Influenza vaccine deferral: Deferred  (03/04/2010)    Tetanus booster: Not documented    Pneumococcal vaccine: Historical  (06/05/2006)    H. zoster vaccine: Not documented   H. zoster vaccine deferral: Not available  (03/04/2010)  Colorectal Screening   Hemoccult: Not documented   Hemoccult action/deferral: Refused  (02/02/2010)    Colonoscopy: Not documented   Colonoscopy action/deferral: Refused  (02/02/2010)  Other Screening   PSA: Not documented   PSA action/deferral: Discussed-PSA declined  (02/02/2010)   Smoking status: current  (  07/29/2010)   Smoking cessation counseling: yes  (07/29/2010)  Lipids   Total Cholesterol: 142  (02/02/2010)   Lipid panel action/deferral: Lipid Panel ordered   LDL: 91  (02/02/2010)   LDL Direct: Not documented   HDL: 28  (02/02/2010)   Triglycerides: 117  (02/02/2010)    SGOT (AST): 17  (02/02/2010)   BMP action: Ordered   SGPT (ALT): 20  (02/02/2010)   Alkaline phosphatase: 341  (02/02/2010)   Total bilirubin: 0.5  (02/02/2010)  Hypertension   Last Blood Pressure: 142 / 80  (07/29/2010)   Serum creatinine: 1.22  (03/04/2010)   BMP action: Ordered   Serum potassium 4.0  (03/04/2010)  Self-Management Support :   Personal  Goals (by the next clinic visit) :      Personal blood pressure goal: 140/90  (07/29/2010)     Personal LDL goal: 100  (07/29/2010)    Patient will work on the following items until the next clinic visit to reach self-care goals:     Medications and monitoring: take my medicines every day  (07/29/2010)     Eating: eat foods that are low in salt, eat baked foods instead of fried foods  (07/29/2010)     Activity: take a 30 minute walk every day  (03/04/2010)    Hypertension self-management support: Resources for patients handout  (07/29/2010)    Lipid self-management support: Resources for patients handout  (07/29/2010)     Self-management comments: PATIENT STATES THAT HE STILL WORKS ! THAT'S ENOUGH EXERCISE.      Resource handout printed.

## 2010-11-17 NOTE — Assessment & Plan Note (Signed)
Summary: 2WK FU/VS   Vital Signs:  Patient Profile:   75 Years Old Male Height:     72 inches (182.88 cm) Weight:      190.04 pounds (86.38 kg) BMI:     25.87 Temp:     96.3 degrees F (35.72 degrees C) oral Pulse rate:   71 / minute BP sitting:   177 / 85  (right arm)  Pt. in pain?   no  Vitals Entered By: Angelina Ok RN (Feb 28, 2008 4:03 PM)              Is Patient Diabetic? No Nutritional Status BMI of 25 - 29 = overweight  Does patient need assistance? Functional Status Self care Ambulation Normal     Chief Complaint:  2 week check up B/P 170/ 80 P- 68.  History of Present Illness: Zachary Santos is a 75 yo man that presents to clinic for follow up on his BP.  Pt was started on lisinopril and HCTZ in hospital for HTN.  Pt developed rash in hospital, likely contact dermatitis.  On follow up visit, pt was instructed to stop lisinopril secondary to rash and acute renal insufficiency.  He comes in today for follow up.  His rash is better on his back, though chest, and groin are still the same.  He has no complaints today.    Updated Prior Medication List: HYDROCHLOROTHIAZIDE 25 MG  TABS (HYDROCHLOROTHIAZIDE) one tab by mouth daily ALBUTEROL 90 MCG/ACT  AERS (ALBUTEROL) one puff every 6 hours as needed for shortness of breath ASPIRIN 81 MG  TBEC (ASPIRIN) one tab daily LISINOPRIL 20 MG  TABS (LISINOPRIL) one tab by mouth daily      Risk Factors:     Counseled to quit/cut down tobacco use:  yes   Review of Systems      See HPI   Physical Exam  General:     alert, well-developed, well-nourished, and well-hydrated.   Head:     normocephalic and atraumatic.   Lungs:     normal respiratory effort, no accessory muscle use, normal breath sounds, no crackles, and no wheezes.   Heart:     normal rate, regular rhythm, no murmur, no gallop, no rub, and no JVD.   Abdomen:     soft, non-tender, normal bowel sounds, and no distention.   Neurologic:     alert &  oriented X3, cranial nerves II-XII intact, strength normal in all extremities, sensation intact to light touch, and gait normal.   Skin:     maculopapular rash over trunk and prox thighs.    Impression & Recommendations:  Problem # 1:  RENAL INSUFFICIENCY, ACUTE (ICD-585.9) Most likely secondary to combo HCTZ/lisinopril.  Will check urnine sodium, creatinine, and eosinophils (r/o pre-renal, AIN given rash).  Will check BMET to confirm normalizing trend.  Orders: T-Basic Metabolic Panel [phone]) T- * Misc. Laboratory test [phone]) T-Urinalysis [phone]) T-CBC w/Diff [phone])   Problem # 2:  ESSENTIAL HYPERTENSION, BENIGN (ICD-401.1) Pt initially started on combo HCTZ/lisinopril.  Given he has no DM adn Cr was 1.78 (HCTZ ineffective) will d/c both and start norvasc (also, both meds started prior to rash). His updated medication list for this problem includes:    Hydrochlorothiazide 25 Mg Tabs (Hydrochlorothiazide) ..... One tab by mouth daily    Lisinopril 20 Mg Tabs (Lisinopril) ..... One tab by mouth daily    Norvasc 5 Mg Tabs (Amlodipine besylate) .[name]... Take 1 tablet by mouth once a day  Orders:  T- * Misc. Laboratory test [phone])   Complete Medication List: 1)  Hydrochlorothiazide 25 Mg Tabs (Hydrochlorothiazide) .... One tab by mouth daily 2)  Albuterol 90 Mcg/act Aers (Albuterol) .... One puff every 6 hours as needed for shortness of breath 3)  Aspirin 81 Mg Tbec (Aspirin) .... One tab daily 4)  Lisinopril 20 Mg Tabs (Lisinopril) .... One tab by mouth daily 5)  Norvasc 5 Mg Tabs (Amlodipine besylate) .... Take 1 tablet by mouth once a day   Patient Instructions: 1)  Please schedule a follow-up appointment in 2 weeks, with Dr. Onalee Hua please. 2)  Stop hydrochlorothiazide. 3)  Do not start lisinopril 4)  Start norvasc as prescribed. 5)  Measure blood pressure and call us in 4 days, leave message for Dr. Onalee Hua with the values. 6)  Will call you if any lab  results are abnormal. 7)  Take benadryl 50mg  (usually 2 tablets 25mg  each) every 4-6 hours, as long as not working, driving etc. 8)  Take zantac 150mg  twice daily, it is OTC. 9)  Tobacco is very bad for your health and your loved ones! You Should stop smoking!. 10)  Stop Smoking Tips: Choose a Quit date. Cut down before the Quit date. decide what you will do as a substitute when you feel the urge to smoke(gum,toothpick,exercise). 11)  It is important that you exercise regularly at least 20 minutes 5 times a week. If you develop chest pain, have severe difficulty breathing, or feel very tired , stop exercising immediately and seek medical attention.   Prescriptions: NORVASC 5 MG  TABS (AMLODIPINE BESYLATE) Take 1 tablet by mouth once a day  #30 x 0   Entered and Authorized by:   Mariea Stable MD   Signed by:   Mariea Stable MD on 02/28/2008   Method used:   Electronically sent to ...       [facility] [address]       [address]       [place], [place]  [address]       Ph: [phone]       Fax: [phone]   RxID:   [id number]  ]

## 2010-11-19 NOTE — Miscellaneous (Signed)
Summary: Appointment No Show  Appointment status changed to no show by [employer] on 09/11/2010 10:29 AM.  No Show Comments ---------------- echo/angina/jml  Appointment Information ----------------------- Appt Type:  CARDIOLOGY ANCILLARY VISIT      Date:  Friday, September 11, 2010      Time:  7:30 AM for 60 min   Urgency:  Routine   Made By:  Pearson Grippe  To Visit:  [id number]    Reason:  echo/angina/jml  Appt Comments ------------- -- 09/11/10 10:29: ([facility]) NO SHOW -- echo/angina/jml -- 09/11/10 10:10: ([facility]) NO SHOW -- echo/angina/jml -- 09/09/10 13:27: ([facility]) BOOKED -- Routine CARDIOLOGY ANCILLARY VISIT at 09/11/2010 7:30 AM for 60 min echo/angina/jml -- 09/02/10 16:19: (CE

## 2011-03-02 NOTE — Discharge Summary (Signed)
NAMEBRAYDIN, Zachary Santos                 ACCOUNT NO.:  [id number]   MEDICAL RECORD NO.:  [id number]          PATIENT TYPE:  INP   LOCATION:  3702                         FACILITY:  [facility]   PHYSICIAN:  C. Ulyess Mort, M.D.DATE OF BIRTH:  [birth year]   DATE OF ADMISSION:  02/03/2008  DATE OF DISCHARGE:  02/05/2008                               DISCHARGE SUMMARY   DISCHARGE DIAGNOSES:  1. Hemoptysis caused by bronchitis.  2. Chronic obstructive pulmonary disease.  3. Hypertension.  4. Stable angina.  5. Tobacco abuse.  6. History of cholecystectomy.  7. History of cervical fusion surgery.  8. Trigeminal neuralgia, status post surgery in 2002 and with the      resolution of symptoms.  9. Status post appendectomy.  10.Paget's disease.   DISCHARGE MEDICATIONS:  1. Azithromycin 250 mg p.o. daily x7 days.  2. Hydrochlorothiazide 25 mg p.o. daily.  3. Nicotine patch 21 mg every 24 hours for the next two weeks.  4. Lisinopril 20 mg p.o. daily.  5. Zocor 20 mg p.o. nightly.  6. Albuterol inhaler 90 mcg every 4 hours as needed for shortness of      breath.  7. Aspirin 81 mg p.o. daily.   CONSULTATIONS:  Patient had consultation with pulmonologist, Dr. Shelle Iron,  who indicated that the patient's hemoptysis most likely was being caused  by acute-on-chronic bronchitis and not tuberculosis, and not tracheal  neoplasm.   HOSPITAL FOLLOWUP:  Patient will follow up with Dr. Lollie Sails in the  outpatient clinic of internal medicine at [facility] in regards to his  chronic medical problems, including COPD, hypertension, in addition to  his episode of acute-on-chronic bronchitis experienced here in the  hospital.   PROCEDURES PERFORMED:  1. Chest x-ray performed on 02/03/08 showed mild chronic bronchial      thickening without any focal consolidation, edema, or fusion.  No      pneumothorax.  2. CT angiogram of the chest showing no pulmonary embolus.  No acute      pulmonary processes.   There is diffuse mid thoracic spine      degenerative changes seen.  3. CT of the head without contrast, including status post left      occipital craniectomy, chronic small vessel ischemic disease.  4. CT of the sinuses indicating paranasal sinuses which are normally      aerated.  There are no air/fluid levels or mucosal thickening.  5. A 2D echocardiogram performed on 02/05/08 indicating normal left      ventricular systolic function.  EF of 65%.  No regional wall motion      abnormalities.  Estimated peak pulmonary artery systolic pressure      was moderately increased.   ADMITTING HISTORY AND PHYSICAL:  This is a 75 year old male with a  history of hypertension, COPD, and tobacco abuse, who comes in without  any recent history of primary medical care.  He has been having  hemoptysis daily for the last seven days.  He usually has frequent  coughing with sputum production over the last two years, and it has only  recently just become blood-tinged.  Usually the amount of blood is worse  in the morning.  He says he has brought up as much as a teaspoon at a  time over the last few days.  He denies any current chest pain.  He does  endorse shortness of breath, especially on exertion.  He has had chest  pain with pressure on exertion consistently over the last several years.  He indicates he used to be very active but has decreased his activity to  compensate for the dyspnea on exertion.  He also notes that he feels  like he has drainage in the back of his throat from his sinuses and from  his oropharynx and tongue.   PHYSICAL EXAMINATION:  Temperature 97.4, blood pressure 191/107, pulse  95, respirations 25, satting 95% on room air.  This is an elderly man not appearing in any acute distress.  EYES:  Muddy Runnels sclerae bilaterally.  Pupils are equally round and  reactive to light.  ENT:  Oropharynx is erythematous but no exudates in the posterior  aspects.  NECK:  No lymphadenopathy.   No JVD.  RESPIRATORY:  Bilateral rhonchi heard.  Crackles up to the mid lung  fields bilaterally.  CARDIOVASCULAR:  Bradycardic with no murmurs, rubs or gallops.  GI:  Soft, nontender, nondistended with good bowel sounds.  EXTREMITIES:  Capillary refill less than 2 seconds.  Warm extremities  with dry skin.  Skin without any rashes.  NEURO:  Cranial nerves II-XII are intact.  Muscle strength is +5 in all  extremities.  Sensation to light touch intact in all extremities.  PSYCH:  Appropriate.   ADMITTING LABS:  Sodium 140, potassium 3.7, chloride 103, bicarb 19, BUN  11, creatinine 1.2, glucose 108.  White count 7.7, hemoglobin 16.6,  platelets 168.  Point-of-care cardiac markers negative.   HOSPITAL COURSE BY PROBLEM:  1. Hemoptysis:  The differential for the patient's hemoptysis included      bronchitis, tuberculosis, AV fistula, sinus infections, pneumonia,      PE, bronchiectasis, and neoplasm.  CT scan of the chest was able to      rule out pulmonary embolism, pneumonia, bronchiectasis, and      neoplasm.  We obtained a sputum culture and Gram's stain and AFB      cultures x3 and AFB smear.  The AFB smear was negative.  Blood      cultures were negative x2.  Patient's PT/INR were normal.  We felt      the patient likely had acute-on-chronic bronchitis and treated him      with azythromycin and frequent inhalers, including albuterol MDI      and Atrovent.  We called a pulmonary consult to help to define the      risk for the patient to have a tracheal or endobronchial tumor that      could be causing this hemoptysis.  Dr. Shelle Iron felt the patient      likely had bronchitis and simply needed to be treated      symptomatically with azythromycin for several days.  He urged close      followup in clinic and prompt discharge, as the patient's      hemoptysis and shortness of breath have become significantly better      while in the hospital after 72 hours.  We will follow the  patient's      case of bronchitis in the clinic.  If his hemoptysis does not  improve, he will need to visit Dr. Shelle Iron in the outpatient basis.  2. Hypertension:  The patient came in severely hypertensive with      systolics in the 190s and 200s.  He had been taking high blood      pressure medications for several months but quit that recently.  We      treated the patient with Norvasc in addition to HCTZ and lisinopril      while in the hospital.  Again, by discharge, his problem had also      become significantly better, as the patient was having blood      pressures mainly in the 150s but at times up to the 160s and 170s.      We will follow the patient up in clinic in regards to his high      blood pressure.  We feel with the adequate treatment with 2-3      medications, his blood pressure can be put back into the goal range      for him, which would be below 135 systolic.  3. Stable angina:  The patient has a history of stable angina.  Given      his history of tobacco abuse, his age, sex, he is at high risk for      having coronary artery disease.  We will set him up to have an      appointment with the cardiologist in the outpatient basis.  For      now, we will treat the patient with aspirin, control his blood      pressure, and urge him to quit smoking.  We will provide the      patient with an nicotine patch to help him.  4. Tobacco abuse:  See above problem.  Again, we provided the patient      with a nicotine patch and tobacco cessation counselor while in the      hospital.  We will follow his progress and cessation of smoking in      the outpatient basis.  5. Paget's disease:  Patient had an elevated alkaline phosphatase.  He      revealed that he has been diagnosed with Paget's disease.      Symptomatically, the patient is doing well.   DISCHARGE VITAL SIGNS:  Temperature 98, blood pressure 150/94, pulse 67,  respirations 18, sats in the 97% on room air.   Sodium  138, potassium 3.4, chloride 102, bicarb 24, BUN 9, creatinine  1.05, glucose 95.  Hemoglobin 14, white count 8.2, platelets 179.      Lollie Sails, MD      C. Ulyess Mort, M.D.  Electronically Signed    CB/MEDQ  D:  03/13/2008  T:  03/13/2008  Job:  [id number]

## 2011-03-02 NOTE — Consult Note (Signed)
[place]OU[place], REAS                 ACCOUNT NO.:  [id number]   MEDICAL RECORD NO.:  [id number]          PATIENT TYPE:  INP   LOCATION:  3702                         FACILITY:  [facility]   PHYSICIAN:  Barbaraann Share, MD,FCCPDATE OF BIRTH:  [birth year] 08, 1936   DATE OF CONSULTATION:  02/04/2008  DATE OF DISCHARGE:                                 CONSULTATION   HISTORY OF PRESENT ILLNESS:  The patient is very pleasant 75 year old  gentleman who I have been asked to see for hemoptysis.  The patient has  a long history of tobacco abuse and gives a 1-week history of mucus with  small quantities of dark blood.  This started as small amounts/spots and  then progressed to a size just larger than a quarter.  The patient has  seen no bright red blood.  He denies epistaxis or problems with peptic  ulcer disease.  He does feel the mucus is collecting in his throat  before he brings it up, and he cannot say it is definitely from his  chest.  The patient denies chest pain, weight loss, decreased appetite  or  fevers, chills and sweats.  He has had a chest x-ray and CT of the  chest as well as CT of the sinuses that were totally unremarkable.  The  patient states he does have chest rattling over this time but no  definite purulence.  He is unsure if he has a chest cold.   PAST MEDICAL HISTORY:  1. Hypertension  2. Status post cholecystectomy.  3. History of cervical fusion.  4. History of trigeminal neuralgia with surgery.  5. Status post appendectomy.   ALLERGIES:  No known drug allergies.   SOCIAL HISTORY:  He is currently retired.  He has a history of smoking 1-  pack per day or little bit more for most of his adult life.  He is  married and lives with his wife.   FAMILY HISTORY:  Remarkable for heart disease, diabetes as well as  asthma.   REVIEW OF SYSTEMS:  As per history of present illness.  I will see  patient intake form in the admitted chart.   PHYSICAL EXAMINATION:  GENERAL:  He is a  well-developed male in no acute  distress.  VITAL SIGNS:  Blood pressure is 134/98, pulse is 70, respiratory rate is  12, and he is afebrile.  HEENT:  Pupils equal, round and reactive to light accommodation.  Extraocular muscles were intact.  Nares were patent without discharge.  Oropharynx is clear.  No obvious source of bleeding.  NECK:  Supple without JVD or lymphadenopathies.  No palpable  thyromegaly.  CHEST:  Reveals mild coarse breath sounds but no wheezes or rhonchi.  There were no crackles.  CARDIAC:  Reveals regular rate and rhythm.  No murmurs, rubs, or  gallops.  ABDOMEN:  Soft, nontender with good bowel sounds.  GU:  Rectal exam and pelvic exam was not indicated.  LOWER EXTREMITIES:  Without significant edema.  Pulses were intact  distally.  NEUROLOGICAL:  He is alert and oriented and moves all 4 extremities  without obvious motor deficits.   IMPRESSION:  Hemoptysis of unknown etiology.  It is really unclear  whether this is coming from his upper airway, his gastrointestinal  tract, or his tracheobronchial tree.  He has a normal chest x-ray and CT  scan.  There is a less than 10% chance of occult endobronchial lesions  in smokers greater than age 32 with nonlocalizing chest x-ray.  It is  probably even less in those with negative chest CT, but still definitely  possible.  Most common cause of hemoptysis in this patient population is  acute and chronic bronchitis.  At this point, I would recommend treating  him for acute bronchitis and see if this resolves.   SUGGESTIONS:  1. With 3-7 days with antibiotics and bronchodilators as an      outpatient.  If his hemoptysis does not resolve or if it recurs      once he is treated with antibiotics, we will need airway exam as an      outpatient.  I would be happy to see him for this.  2. Smoking cessation.  3. We will discontinue respiratory isolation since the patient has a      normal chest x-ray and chest CT  scan.      Barbaraann Share, MD,FCCP  Electronically Signed     [facility]  D:  02/04/2008  T:  02/05/2008  Job:  [id number]   cc:   Ileana Roup, M.D.

## 2011-03-19 ENCOUNTER — Encounter: Payer: Self-pay | Admitting: Internal Medicine

## 2011-06-08 ENCOUNTER — Emergency Department (HOSPITAL_COMMUNITY): Payer: Medicare Other

## 2011-06-08 ENCOUNTER — Emergency Department (HOSPITAL_COMMUNITY)
Admission: EM | Admit: 2011-06-08 | Discharge: 2011-06-08 | Disposition: A | Discharge status: Home / Self Care | Payer: Medicare Other | Attending: Emergency Medicine | Admitting: Emergency Medicine

## 2011-06-08 DIAGNOSIS — R072 Precordial pain: Secondary | ICD-10-CM | POA: Insufficient documentation

## 2011-06-08 DIAGNOSIS — R05 Cough: Secondary | ICD-10-CM | POA: Insufficient documentation

## 2011-06-08 DIAGNOSIS — J4489 Other specified chronic obstructive pulmonary disease: Secondary | ICD-10-CM | POA: Insufficient documentation

## 2011-06-08 DIAGNOSIS — R7309 Other abnormal glucose: Secondary | ICD-10-CM | POA: Insufficient documentation

## 2011-06-08 DIAGNOSIS — J449 Chronic obstructive pulmonary disease, unspecified: Secondary | ICD-10-CM | POA: Insufficient documentation

## 2011-06-08 DIAGNOSIS — IMO0001 Reserved for inherently not codable concepts without codable children: Secondary | ICD-10-CM | POA: Insufficient documentation

## 2011-06-08 DIAGNOSIS — R059 Cough, unspecified: Secondary | ICD-10-CM | POA: Insufficient documentation

## 2011-06-08 DIAGNOSIS — I10 Essential (primary) hypertension: Secondary | ICD-10-CM | POA: Insufficient documentation

## 2011-06-08 LAB — POCT I-STAT TROPONIN I: Troponin i, poc: 0 ng/mL (ref 0.00–0.08)

## 2011-06-08 LAB — BASIC METABOLIC PANEL
BUN: 23 mg/dL (ref 6–23)
CO2: 28 mEq/L (ref 19–32)
Calcium: 9.3 mg/dL (ref 8.4–10.5)
Chloride: 99 mEq/L (ref 96–112)
Creatinine, Ser: 1.59 mg/dL — ABNORMAL HIGH (ref 0.50–1.35)
GFR calc Af Amer: 52 mL/min — ABNORMAL LOW (ref >60)
GFR calc non Af Amer: 43 mL/min — ABNORMAL LOW (ref >60)
Glucose, Bld: 262 mg/dL — ABNORMAL HIGH (ref 70–99)
Potassium: 3.2 mEq/L — ABNORMAL LOW (ref 3.5–5.1)
Sodium: 136 mEq/L (ref 135–145)

## 2011-06-08 LAB — CBC
HCT: 40.5 % (ref 39.0–52.0)
Hemoglobin: 14.2 g/dL (ref 13.0–17.0)
MCH: 30.7 pg (ref 26.0–34.0)
MCHC: 35.1 g/dL (ref 30.0–36.0)
MCV: 87.5 fL (ref 78.0–100.0)
Platelets: 175 10*3/uL (ref 150–400)
RBC: 4.63 MIL/uL (ref 4.22–5.81)
RDW: 13.5 % (ref 11.5–15.5)
WBC: 5.3 10*3/uL (ref 4.0–10.5)

## 2011-06-08 LAB — CK TOTAL AND CKMB (NOT AT ARMC)
CK, MB: 2.9 ng/mL (ref 0.3–4.0)
Relative Index: 2.3 (ref 0.0–2.5)
Total CK: 124 U/L (ref 7–232)

## 2011-06-08 LAB — D-DIMER, QUANTITATIVE: D-Dimer, Quant: 1.03 ug/mL-FEU — ABNORMAL HIGH (ref 0.00–0.48)

## 2011-06-08 MED ORDER — TECHNETIUM TO 99M ALBUMIN AGGREGATED
6.0000 | Freq: Once | INTRAVENOUS | Status: AC | PRN
  Administered 2011-06-08: 6 via INTRAVENOUS

## 2011-06-08 MED ORDER — XENON XE 133 GAS
10.0000 | GAS_FOR_INHALATION | Freq: Once | RESPIRATORY_TRACT | Status: AC | PRN
  Administered 2011-06-08: 10 via RESPIRATORY_TRACT

## 2011-06-28 ENCOUNTER — Encounter: Payer: Self-pay | Admitting: Internal Medicine

## 2011-06-28 ENCOUNTER — Ambulatory Visit (INDEPENDENT_AMBULATORY_CARE_PROVIDER_SITE_OTHER): Payer: Medicare Other | Admitting: Internal Medicine

## 2011-06-28 DIAGNOSIS — F17200 Nicotine dependence, unspecified, uncomplicated: Secondary | ICD-10-CM

## 2011-06-28 DIAGNOSIS — Z0000 Encounter for general adult medical examination without abnormal findings: Secondary | ICD-10-CM

## 2011-06-28 DIAGNOSIS — Z87898 Personal history of other specified conditions: Secondary | ICD-10-CM

## 2011-06-28 DIAGNOSIS — Z23 Encounter for immunization: Secondary | ICD-10-CM

## 2011-06-28 DIAGNOSIS — E785 Hyperlipidemia, unspecified: Secondary | ICD-10-CM

## 2011-06-28 DIAGNOSIS — D485 Neoplasm of uncertain behavior of skin: Secondary | ICD-10-CM

## 2011-06-28 DIAGNOSIS — I10 Essential (primary) hypertension: Secondary | ICD-10-CM

## 2011-06-28 LAB — LIPID PANEL
Cholesterol: 161 mg/dL (ref 0–200)
HDL: 24 mg/dL — ABNORMAL LOW (ref >39)
LDL Cholesterol: 98 mg/dL (ref 0–99)
Total CHOL/HDL Ratio: 6.7 Ratio
Triglycerides: 193 mg/dL — ABNORMAL HIGH (ref <150)
VLDL: 39 mg/dL (ref 0–40)

## 2011-06-28 MED ORDER — ASPIRIN 81 MG PO TBEC: 81.0000 mg | DELAYED_RELEASE_TABLET | Freq: Every day | ORAL | Status: DC

## 2011-06-28 MED ORDER — HYDROCHLOROTHIAZIDE 25 MG PO TABS: 25.0000 mg | ORAL_TABLET | Freq: Every day | ORAL | Status: DC

## 2011-06-28 MED ORDER — AMLODIPINE BESYLATE 10 MG PO TABS: 10.0000 mg | ORAL_TABLET | Freq: Every day | ORAL | Status: DC

## 2011-06-28 NOTE — Assessment & Plan Note (Addendum)
Mr. Rogala blood pressure is well controlled today.  We will continue current therapy with amlodipine and HCTZ.  We will also check BMP.

## 2011-06-28 NOTE — Assessment & Plan Note (Addendum)
Zachary Santos cholesterol was well-controlled at his previous lab test in April of 2011.  He was at goal of LDL 91.  However, as he is no longer taking a statin, we will recheck lipid panel today to make sure that the LDL has not increased

## 2011-06-28 NOTE — Progress Notes (Signed)
  Subjective:    Patient ID: Zachary Santos, male    DOB: 1936-08-28, 76 y.o.   MRN: [id number]  HPI  Zachary Santos is a 75 year old gentleman past medical history significant for hypertension, COPD, BPH, and tobacco abuse. He presents today for routine followup and medication refill.  Zachary Santos states that in general he feels very well. He does have a slight amount of shortness of breath, though this is at his baseline. He states that he has decreased stamina that has occurred gradually, as he is aged. He states that he no longer plays golf.  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  Zachary Santos was in the emergency department on 06/08/2011 with shortness of breath however he was ruled out for PE and sent home with prednisone taper. He states prednisone worked very well for him and he is currently at baseline.  Zachary Santos does note that he has a spot on his right cheek.  He states it appeared approximately one year ago and has been steadily enlarging. He states it does not bother him it does not itch or bleed.  Review of Systems  Denies: headache, lightheadedness of head, dizziness, blurry vision, chest pain, abdominal pain, shortness of breath above baseline, diarrhea, and constipation, leg swelling, fevers, chills, cough. Endorses: shortness of breath, but this is at baseline, and urinary hesitancy though this is improving with doxazosin therapy. Objective:   Physical Exam  Physical Exam: General: Vital signs reviewed and noted. Well-developed, well-nourished, in no acute distress; alert, appropriate and cooperative throughout examination. Head: Normocephalic, atraumatic. Eyes: PERRL, EOMI, No signs of anemia or jaundince. Throat: Oropharynx nonerythematous, no exudate appreciated. Poor dentition.  Neck: No deformities, masses, or tenderness noted. No JVD. Lungs: Normal respiratory effort. Clear to auscultation BL without crackles or wheezes. Heart: RRR. S1  and S2 normal without gallop, murmur, or rubs. Abdomen: BS normoactive. Soft, Nondistended, non-tender. No masses or organomegaly. Large ventral hernia  Extremities: No pretibial edema. Neurologic: A&O X3, CN II - XII are grossly intact. Moves all 4 extremities without difficulty. Sensations intact to light touch Skin: There is a 1 x 2 cm pigmented light tan macule the right infraorbital cheek. This has irregular borders and irregular shape, though it is uniform in color. There are scattered erythematous scaly  Patches, including the nose and left preauricular temple.         Assessment & Plan:

## 2011-06-28 NOTE — Assessment & Plan Note (Signed)
Zachary Santos his colonoscopy today. Flu shot and TDAP given. Pneumovax complete. Told to see health department or commercial pharmacy for shingles vaccine.

## 2011-06-28 NOTE — Patient Instructions (Signed)
We will call you if your lab results are abnormal.  Otherwise, return to clinic in June 2013 to see Dr. Candy Sledge.  Please see a dermatologist within the month to evaluate the lesion on your left cheek.

## 2011-06-28 NOTE — Assessment & Plan Note (Addendum)
Zachary Santos was extensively counseled regarding quitting smoking. He states that smoking is one of the last things that gives him pleasure in life and is not interested in quitting.

## 2011-06-28 NOTE — Assessment & Plan Note (Addendum)
Mr. Carlile does endorse continued hesitancy and frequency in urination. However he states this is much improved with doxazosin therapy.  --We will continue doxazosin 2 mg daily. Mr. Kitson requires more aggressive therapy will increase to doxazosin 4 mg daily or add finasteride

## 2011-06-29 LAB — BASIC METABOLIC PANEL WITH GFR
BUN: 14 mg/dL (ref 6–23)
CO2: 27 mEq/L (ref 19–32)
Calcium: 9.5 mg/dL (ref 8.4–10.5)
Chloride: 103 mEq/L (ref 96–112)
Creat: 1.3 mg/dL (ref 0.50–1.35)
GFR, Est African American: >60 mL/min (ref >60)
GFR, Est Non African American: 54 mL/min — ABNORMAL LOW (ref >60)
Glucose, Bld: 109 mg/dL — ABNORMAL HIGH (ref 70–99)
Potassium: 3.8 mEq/L (ref 3.5–5.3)
Sodium: 142 mEq/L (ref 135–145)

## 2011-07-13 LAB — CK TOTAL AND CKMB (NOT AT ARMC)
CK, MB: 1.8
CK, MB: 2
CK, MB: 2.1
CK, MB: 2.2
CK, MB: 2.4
CK, MB: 2.5
Relative Index: 1.7
Relative Index: 1.9
Relative Index: 1.9
Relative Index: 2.1
Relative Index: 2.2
Relative Index: INVALID
Total CK: 103
Total CK: 109
Total CK: 115
Total CK: 120
Total CK: 126
Total CK: 99

## 2011-07-13 LAB — DIFFERENTIAL
Basophils Absolute: 0
Basophils Absolute: 0
Basophils Relative: 0
Basophils Relative: 1
Eosinophils Absolute: 0.1
Eosinophils Absolute: 0.2
Eosinophils Relative: 2
Eosinophils Relative: 3
Lymphocytes Relative: 22
Lymphocytes Relative: 33
Lymphs Abs: 1.7
Lymphs Abs: 2.5
Monocytes Absolute: 0.5
Monocytes Absolute: 0.7
Monocytes Relative: 7
Monocytes Relative: 9
Neutro Abs: 4.4
Neutro Abs: 5.3
Neutrophils Relative %: 57
Neutrophils Relative %: 67

## 2011-07-13 LAB — CBC
HCT: 39.5
HCT: 40.3
HCT: 47.9
Hemoglobin: 13.7
Hemoglobin: 14
Hemoglobin: 16.6
MCHC: 34
MCHC: 34.6
MCHC: 35.5
MCV: 88.7
MCV: 89.3
MCV: 91.7
Platelets: 168
Platelets: 172
Platelets: 179
RBC: 4.46
RBC: 4.51
RBC: 5.23
RDW: 14
RDW: 14.1
RDW: 14.8
WBC: 7.7
WBC: 7.8
WBC: 8.2

## 2011-07-13 LAB — BASIC METABOLIC PANEL
BUN: 7
BUN: 9
CO2: 24
CO2: 24
Calcium: 9
Calcium: 9
Chloride: 102
Chloride: 106
Creatinine, Ser: 0.97
Creatinine, Ser: 1.05
GFR calc Af Amer: >60
GFR calc Af Amer: >60
GFR calc non Af Amer: >60
GFR calc non Af Amer: >60
Glucose, Bld: 115 — ABNORMAL HIGH
Glucose, Bld: 95
Potassium: 3.4 — ABNORMAL LOW
Potassium: 3.5
Sodium: 138
Sodium: 138

## 2011-07-13 LAB — LIPID PANEL
Cholesterol: 140
HDL: 22 — ABNORMAL LOW
LDL Cholesterol: 96
Total CHOL/HDL Ratio: 6.4
Triglycerides: 111
VLDL: 22

## 2011-07-13 LAB — POCT I-STAT, CHEM 8
BUN: 11
Calcium, Ion: 1.14
Chloride: 103
Creatinine, Ser: 1.2
Glucose, Bld: 108 — ABNORMAL HIGH
HCT: 48
Hemoglobin: 16.3
Potassium: 3.7
Sodium: 140
TCO2: 27

## 2011-07-13 LAB — CULTURE, BLOOD (ROUTINE X 2)
Culture: NO GROWTH
Culture: NO GROWTH

## 2011-07-13 LAB — URINALYSIS, ROUTINE W REFLEX MICROSCOPIC
Bilirubin Urine: NEGATIVE
Glucose, UA: NEGATIVE
Hgb urine dipstick: NEGATIVE
Ketones, ur: NEGATIVE
Nitrite: NEGATIVE
Protein, ur: NEGATIVE
Specific Gravity, Urine: 1.023
Urobilinogen, UA: 1
pH: 8

## 2011-07-13 LAB — COMPREHENSIVE METABOLIC PANEL
ALT: 16
AST: 19
Albumin: 4.2
Alkaline Phosphatase: 436 — ABNORMAL HIGH
BUN: 8
CO2: 28
Calcium: 9.4
Chloride: 104
Creatinine, Ser: 1.12
GFR calc Af Amer: >60
GFR calc non Af Amer: >60
Glucose, Bld: 82
Potassium: 3.5
Sodium: 142
Total Bilirubin: 0.6
Total Protein: 7.2

## 2011-07-13 LAB — TROPONIN I: Troponin I: 0.02

## 2011-07-13 LAB — URINE MICROSCOPIC-ADD ON

## 2011-07-13 LAB — URINE CULTURE
Colony Count: 9000
Special Requests: NEGATIVE

## 2011-07-13 LAB — HEMOGLOBIN A1C
Hgb A1c MFr Bld: 6.4 — ABNORMAL HIGH
Hgb A1c MFr Bld: 6.5 — ABNORMAL HIGH
Mean Plasma Glucose: 151
Mean Plasma Glucose: 154

## 2011-07-13 LAB — APTT: aPTT: 47 — ABNORMAL HIGH

## 2011-07-13 LAB — AFB CULTURE WITH SMEAR (NOT AT ARMC)
Acid Fast Smear: NONE SEEN
Acid Fast Smear: NONE SEEN

## 2011-07-13 LAB — HIV ANTIBODY (ROUTINE TESTING W REFLEX): HIV: NONREACTIVE

## 2011-07-13 LAB — PROTIME-INR
INR: 1.1
Prothrombin Time: 14

## 2011-07-13 LAB — GAMMA GT: GGT: 20

## 2011-07-13 LAB — TSH
TSH: 1.29
TSH: 2.228

## 2011-07-13 LAB — POCT CARDIAC MARKERS
CKMB, poc: 1.4
Myoglobin, poc: 102
Operator id: 284251
Troponin i, poc: <0.05

## 2011-07-13 LAB — B-NATRIURETIC PEPTIDE (CONVERTED LAB): Pro B Natriuretic peptide (BNP): 203 — ABNORMAL HIGH

## 2011-07-13 LAB — MAGNESIUM
Magnesium: 2
Magnesium: 2.1

## 2011-07-13 LAB — RAPID URINE DRUG SCREEN, HOSP PERFORMED
Amphetamines: NOT DETECTED
Barbiturates: NOT DETECTED
Benzodiazepines: NOT DETECTED
Cocaine: NOT DETECTED
Opiates: NOT DETECTED
Tetrahydrocannabinol: NOT DETECTED

## 2011-07-13 LAB — TYPE AND SCREEN
ABO/RH(D): O POS
Antibody Screen: NEGATIVE

## 2011-07-13 LAB — ABO/RH: ABO/RH(D): O POS

## 2011-08-06 ENCOUNTER — Other Ambulatory Visit: Payer: Self-pay | Admitting: *Deleted

## 2011-08-06 ENCOUNTER — Other Ambulatory Visit: Payer: Self-pay | Admitting: Internal Medicine

## 2011-08-06 MED ORDER — DOXAZOSIN MESYLATE 2 MG PO TABS: 2.0000 mg | ORAL_TABLET | Freq: Every day | ORAL | Status: DC

## 2011-08-06 NOTE — Telephone Encounter (Signed)
Was not asked to return until June 2013

## 2011-08-06 NOTE — Telephone Encounter (Signed)
Does not need to F/u until 03/2012

## 2012-04-07 ENCOUNTER — Encounter: Payer: Medicare Other | Admitting: Internal Medicine

## 2012-05-11 ENCOUNTER — Ambulatory Visit (INDEPENDENT_AMBULATORY_CARE_PROVIDER_SITE_OTHER): Payer: Medicare Other | Admitting: Internal Medicine

## 2012-05-11 ENCOUNTER — Encounter: Payer: Self-pay | Admitting: Internal Medicine

## 2012-05-11 VITALS — BP 148/80 | HR 56 | Temp 96.6°F | Ht 72.0 in | Wt 194.2 lb

## 2012-05-11 DIAGNOSIS — Z1211 Encounter for screening for malignant neoplasm of colon: Secondary | ICD-10-CM

## 2012-05-11 DIAGNOSIS — N400 Enlarged prostate without lower urinary tract symptoms: Secondary | ICD-10-CM

## 2012-05-11 DIAGNOSIS — I10 Essential (primary) hypertension: Secondary | ICD-10-CM

## 2012-05-11 MED ORDER — AMLODIPINE BESYLATE 10 MG PO TABS: 10.0000 mg | ORAL_TABLET | Freq: Every day | ORAL | Status: DC

## 2012-05-11 MED ORDER — HYDROCHLOROTHIAZIDE 25 MG PO TABS: 25.0000 mg | ORAL_TABLET | Freq: Every day | ORAL | Status: DC

## 2012-05-11 MED ORDER — ASPIRIN 81 MG PO TBEC: 81.0000 mg | DELAYED_RELEASE_TABLET | Freq: Every day | ORAL | Status: DC

## 2012-05-11 NOTE — Patient Instructions (Addendum)
Please return to clinic in three months with blood tests ordered prior to appointment. Please report any symptoms including dizziness after taking increased doxazosin dose.

## 2012-05-11 NOTE — Progress Notes (Signed)
Subjective:     Patient ID: Zachary Santos, male   DOB: [birth year], 76 y.o.   MRN: [id number]  HPI 75 yr. Old WM w/ hx questionable CAD, questionable COPD, HTN, BPH, Paget's disease, ED, presents for a follow up visit. He states he's been taking cardura as prescribed and has not noted much of a change in urine stream. Denies melena, hematochezia. Denies CP, denies SOB. He admits he continues to smoke cigarettes (about 1 pack per day for 60 yrs) and is not interesting in quitting. He also states he is not interested in colonoscopy at this time  Review of Systems  Constitutional: Negative for fever, chills, activity change, appetite change, fatigue and unexpected weight change.  HENT: Negative for hearing loss, ear pain, facial swelling, trouble swallowing, neck pain, neck stiffness, voice change and tinnitus.   Eyes: Negative.   Respiratory: Negative for apnea, cough, chest tightness, shortness of breath and wheezing.   Cardiovascular: Negative for chest pain, palpitations and leg swelling.  Gastrointestinal: Negative for abdominal distention.  Genitourinary: Positive for difficulty urinating. Negative for dysuria, urgency, hematuria, flank pain, decreased urine volume, discharge, penile swelling, scrotal swelling, enuresis, penile pain and testicular pain.  Musculoskeletal: Negative.   Neurological: Negative for dizziness, tremors, syncope, facial asymmetry, speech difficulty, weakness, light-headedness and headaches.  Hematological: Negative.   Psychiatric/Behavioral: Negative.        Objective:   Physical Exam  Constitutional: He is oriented to person, place, and time. He appears well-developed and well-nourished.  HENT:  Head: Normocephalic and atraumatic.  Right Ear: External ear normal.  Left Ear: External ear normal.  Nose: Nose normal.  Mouth/Throat: Oropharynx is clear and moist.  Eyes: Conjunctivae and EOM are normal. Pupils are equal, round, and reactive to light.  Neck: Neck  supple. No JVD present. No tracheal deviation present. No thyromegaly present.  Cardiovascular: Normal rate, regular rhythm and normal heart sounds.  Exam reveals no gallop and no friction rub.   No murmur heard. Pulmonary/Chest: Effort normal and breath sounds normal. No stridor. No respiratory distress. He has no wheezes. He exhibits no tenderness.  Abdominal: Soft. Bowel sounds are normal. He exhibits no distension and no mass. There is no tenderness. There is no rebound and no guarding.  Musculoskeletal: Normal range of motion.  Lymphadenopathy:    He has no cervical adenopathy.  Neurological: He is alert and oriented to person, place, and time. He has normal reflexes. No cranial nerve deficit. Coordination normal.  Skin: Skin is warm. No rash noted.  Psychiatric: He has a normal mood and affect. His behavior is normal. Judgment and thought content normal.       Assessment:     75 yr. Old WM w/ hx questionable CAD, questionable COPD, HTN, BPH, Paget's disease, ED, presents for follow up visit. 1) HTN: Not optimal, he is agreeable to increasing doxazosin. All side effects have been discussed and questions answered. 2) BPH: He likely has slow stream as a result of this. I discussed PSA testing with him in detail, explained benefits and risks, and he is agreeable to ordering a PSA test. Increase doxazosin to 4 mg a daily.  3) Tobacco abuse: He is not ready to quit. Explained risks of tobacco abuse in detail, he understands. 4) ED: He is interested in Cialis, start 10 mg. Explained how to use drug and side effects. 5) Paget's disease: No current treatment, he is not following up with anyone but [place]. LFT's. Asymptomatic. 6) Health Maintenance: States had pneumovax  in past 5 years. Refuses colonoscopy. Agreeable to FOBT.  -Follow up with me in three months.    Plan:     1) HTN: Not optimal, he is agreeable to increasing doxazosin. All side effects have been discussed and questions  answered. 2) BPH: He likely has slow stream as a result of this. I discussed PSA testing with him in detail, explained benefits and risks, and he is agreeable to ordering a PSA test. Increase doxazosin to 4 mg a daily.  3) Tobacco abuse: He is not ready to quit. Explained risks of tobacco abuse in detail, he understands. 4) ED: He is interested in Cialis, start 10 mg. Explained how to use drug and side effects. 5) Paget's disease: No current treatment, he is not following up with anyone but [place]. LFT's. Asymptomatic. 6) Health Maintenance: States had pneumovax in past 5 years. Refuses colonoscopy. Agreeable to FOBT.  -Follow up with me in three months.  Jonah Blue

## 2012-07-20 ENCOUNTER — Ambulatory Visit (INDEPENDENT_AMBULATORY_CARE_PROVIDER_SITE_OTHER): Payer: Medicare Other | Admitting: Internal Medicine

## 2012-07-20 ENCOUNTER — Encounter: Payer: Self-pay | Admitting: Internal Medicine

## 2012-07-20 VITALS — BP 117/71 | HR 58 | Temp 97.5°F | Ht 72.0 in | Wt 193.3 lb

## 2012-07-20 DIAGNOSIS — Z23 Encounter for immunization: Secondary | ICD-10-CM

## 2012-07-20 DIAGNOSIS — M889 Osteitis deformans of unspecified bone: Secondary | ICD-10-CM

## 2012-07-20 DIAGNOSIS — Z87898 Personal history of other specified conditions: Secondary | ICD-10-CM

## 2012-07-20 DIAGNOSIS — E785 Hyperlipidemia, unspecified: Secondary | ICD-10-CM

## 2012-07-20 DIAGNOSIS — I10 Essential (primary) hypertension: Secondary | ICD-10-CM

## 2012-07-20 DIAGNOSIS — N529 Male erectile dysfunction, unspecified: Secondary | ICD-10-CM

## 2012-07-20 LAB — COMPLETE METABOLIC PANEL WITH GFR
ALT: 18 U/L (ref 0–53)
AST: 17 U/L (ref 0–37)
Albumin: 4.5 g/dL (ref 3.5–5.2)
Alkaline Phosphatase: 203 U/L — ABNORMAL HIGH (ref 39–117)
BUN: 16 mg/dL (ref 6–23)
CO2: 28 mEq/L (ref 19–32)
Calcium: 9.7 mg/dL (ref 8.4–10.5)
Chloride: 101 mEq/L (ref 96–112)
Creat: 1.4 mg/dL — ABNORMAL HIGH (ref 0.50–1.35)
GFR, Est African American: 57 mL/min — ABNORMAL LOW
GFR, Est Non African American: 49 mL/min — ABNORMAL LOW
Glucose, Bld: 133 mg/dL — ABNORMAL HIGH (ref 70–99)
Potassium: 3.9 mEq/L (ref 3.5–5.3)
Sodium: 139 mEq/L (ref 135–145)
Total Bilirubin: 0.5 mg/dL (ref 0.3–1.2)
Total Protein: 6.7 g/dL (ref 6.0–8.3)

## 2012-07-20 LAB — LIPID PANEL
Cholesterol: 142 mg/dL (ref 0–200)
HDL: 25 mg/dL — ABNORMAL LOW (ref >39)
LDL Cholesterol: 83 mg/dL (ref 0–99)
Total CHOL/HDL Ratio: 5.7 Ratio
Triglycerides: 172 mg/dL — ABNORMAL HIGH (ref <150)
VLDL: 34 mg/dL (ref 0–40)

## 2012-07-20 LAB — CBC WITH DIFFERENTIAL/PLATELET
Basophils Absolute: 0 10*3/uL (ref 0.0–0.1)
Basophils Relative: 0 % (ref 0–1)
Eosinophils Absolute: 0.2 10*3/uL (ref 0.0–0.7)
Eosinophils Relative: 3 % (ref 0–5)
HCT: 42 % (ref 39.0–52.0)
Hemoglobin: 14.2 g/dL (ref 13.0–17.0)
Lymphocytes Relative: 26 % (ref 12–46)
Lymphs Abs: 1.6 10*3/uL (ref 0.7–4.0)
MCH: 30.5 pg (ref 26.0–34.0)
MCHC: 33.8 g/dL (ref 30.0–36.0)
MCV: 90.1 fL (ref 78.0–100.0)
Monocytes Absolute: 0.6 10*3/uL (ref 0.1–1.0)
Monocytes Relative: 9 % (ref 3–12)
Neutro Abs: 3.9 10*3/uL (ref 1.7–7.7)
Neutrophils Relative %: 62 % (ref 43–77)
Platelets: 198 10*3/uL (ref 150–400)
RBC: 4.66 MIL/uL (ref 4.22–5.81)
RDW: 14.1 % (ref 11.5–15.5)
WBC: 6.3 10*3/uL (ref 4.0–10.5)

## 2012-07-20 LAB — PSA: PSA: 2.5 ng/mL (ref <=4.00)

## 2012-07-20 MED ORDER — TADALAFIL 10 MG PO TABS: 10.0000 mg | ORAL_TABLET | Freq: Every day | ORAL | Status: DC | PRN

## 2012-07-20 NOTE — Progress Notes (Signed)
  Subjective:    Patient ID: Zachary Santos, male    DOB: [birth year], 76 y.o.   MRN: [id number]  HPI 75 yr. Old WM w/ hx questionable CAD, questionable COPD, HTN, BPH, Paget's disease, ED, presents for a follow up visit. Feels well. Denies CP at rest or with exertion.  Denies significant shortness of breath, but admits some SOB when he smokes cigarettes.  Denies melena, hematochezia.  He states he forgot to bring his fecal occult blood cards.   Denies hemoptysis. Denies weight loss.  Denies appetite changes. Denies abdominal pain.  He admits to some slow stream while urinating, unchanged since last time, but he admits he is getting up less at night since increasing doxazosin. He denies dizziness or falls or presyncope or syncope. He states he is surprised his BP is at this level, he states he's usually at 130's SBP. I rechecked his BP manually and it is 116/70.  Review of Systems Complete 12 point review of systems otherwise negative except for that stated in the HPI.    Objective:   Physical Exam Filed Vitals:   07/20/12 1037  BP: 117/71  Pulse: 58  Temp: 97.5 F (36.4 C)   GEN: AAOx3, NAD. HEENT: EOMI, PERRLA, no icterus, no masses, no adenopathy. CV: S1S2, no m/r/g, RRR. PULM: CTA bilat. ABD/GI: Soft, NT, +BS, no guarding, no distention. LE: 2/4 pulses, no c/c/e. NEURO: CN II-XII intact, no focal deficits.  LABS: CBC    Component Value Date/Time   WBC 5.3 06/08/2011 1045   RBC 4.63 06/08/2011 1045   HGB 14.2 06/08/2011 1045   HCT 40.5 06/08/2011 1045   PLT 175 06/08/2011 1045   MCV 87.5 06/08/2011 1045   MCH 30.7 06/08/2011 1045   MCHC 35.1 06/08/2011 1045   RDW 13.5 06/08/2011 1045   LYMPHSABS 1.5 03/04/2010 2033   MONOABS 0.7 03/04/2010 2033   EOSABS 0.2 03/04/2010 2033   BASOSABS 0.0 03/04/2010 2033      Assessment & Plan:  75 yr. Old WM w/ hx questionable CAD, questionable COPD, HTN, BPH, Paget's disease, ED, presents for follow up visit.  1) HTN: His BP is well controlled  at this time, no changes. He states he does not take Nitro for CP as he's had no CP. I advised him under no circumstances should he take Cialis and Nitro. He understands risks and consequences. I will discontinue Nitro. 2) BPH: I discussed PSA testing with him and current controversies regarding testing. I explained shared-decision making model as well. At this time, after thoroughly explaining risks vs. benefits , he wishes to proceed with PSA testing. He can continue current dose of doxazosin. 3) Tobacco abuse: He is not ready to quit. Explained risks of tobacco abuse in detail, he understands.  4) ED: He is interested in Cialis, start 10 mg. Explained how to use drug and side effects. He has not required this medication recently. 5) Paget's disease: No current treatment, he is not following up with anyone but [place]. Asymptomatic. LFT's today. 6) Health Maintenance: States had pneumovax in past 5 years. Refuses colonoscopy. He states he will have FOBT sent to [place]. Wants flu vaccine today. -Follow up with me in three months.  Jonah Blue

## 2012-07-20 NOTE — Patient Instructions (Signed)
Blood tests today. Return to clinic in 3 months. Make sure you bring in or mail stool cards.

## 2012-08-21 ENCOUNTER — Other Ambulatory Visit: Payer: Self-pay | Admitting: Internal Medicine

## 2012-12-07 ENCOUNTER — Ambulatory Visit: Payer: Medicare Other | Admitting: Internal Medicine

## 2012-12-28 ENCOUNTER — Ambulatory Visit: Payer: Medicare Other | Admitting: Internal Medicine

## 2013-01-29 ENCOUNTER — Other Ambulatory Visit: Payer: Self-pay | Admitting: *Deleted

## 2013-01-29 MED ORDER — HYDROCHLOROTHIAZIDE 25 MG PO TABS: 25.0000 mg | ORAL_TABLET | Freq: Every day | ORAL | Status: DC

## 2013-02-15 LAB — CBC WITH DIFFERENTIAL/PLATELET
Basophil #: 0 10*3/uL (ref 0.0–0.1)
Basophil %: 0.5 %
Eosinophil #: 0.2 10*3/uL (ref 0.0–0.7)
Eosinophil %: 2.8 %
HCT: 40 % (ref 40.0–52.0)
HGB: 13.8 g/dL (ref 13.0–18.0)
Lymphocyte #: 1 10*3/uL (ref 1.0–3.6)
Lymphocyte %: 12.5 %
MCH: 30.7 pg (ref 26.0–34.0)
MCHC: 34.4 g/dL (ref 32.0–36.0)
MCV: 89 fL (ref 80–100)
Monocyte #: 1 x10 3/mm (ref 0.2–1.0)
Monocyte %: 13.1 %
Neutrophil #: 5.5 10*3/uL (ref 1.4–6.5)
Neutrophil %: 71.1 %
Platelet: 154 10*3/uL (ref 150–440)
RBC: 4.49 10*6/uL (ref 4.40–5.90)
RDW: 14 % (ref 11.5–14.5)
WBC: 7.8 10*3/uL (ref 3.8–10.6)

## 2013-02-15 LAB — COMPREHENSIVE METABOLIC PANEL
Albumin: 3.7 g/dL (ref 3.4–5.0)
Alkaline Phosphatase: 238 U/L — ABNORMAL HIGH (ref 50–136)
Anion Gap: 5 — ABNORMAL LOW (ref 7–16)
BUN: 11 mg/dL (ref 7–18)
Bilirubin,Total: 0.6 mg/dL (ref 0.2–1.0)
Calcium, Total: 9 mg/dL (ref 8.5–10.1)
Chloride: 102 mmol/L (ref 98–107)
Co2: 29 mmol/L (ref 21–32)
Creatinine: 1.26 mg/dL (ref 0.60–1.30)
EGFR (African American): >60
EGFR (Non-African Amer.): 56 — ABNORMAL LOW
Glucose: 114 mg/dL — ABNORMAL HIGH (ref 65–99)
Osmolality: 272 (ref 275–301)
Potassium: 3.8 mmol/L (ref 3.5–5.1)
SGOT(AST): 24 U/L (ref 15–37)
SGPT (ALT): 25 U/L (ref 12–78)
Sodium: 136 mmol/L (ref 136–145)
Total Protein: 7.1 g/dL (ref 6.4–8.2)

## 2013-02-15 LAB — TROPONIN I: Troponin-I: <0.02 ng/mL

## 2013-02-15 LAB — PRO B NATRIURETIC PEPTIDE: B-Type Natriuretic Peptide: 788 pg/mL — ABNORMAL HIGH (ref 0–125)

## 2013-02-16 ENCOUNTER — Inpatient Hospital Stay: Payer: Self-pay | Admitting: Internal Medicine

## 2013-02-16 LAB — BASIC METABOLIC PANEL
Anion Gap: 8 (ref 7–16)
BUN: 17 mg/dL (ref 7–18)
Calcium, Total: 9.3 mg/dL (ref 8.5–10.1)
Chloride: 103 mmol/L (ref 98–107)
Co2: 26 mmol/L (ref 21–32)
Creatinine: 1.49 mg/dL — ABNORMAL HIGH (ref 0.60–1.30)
EGFR (African American): 53 — ABNORMAL LOW
EGFR (Non-African Amer.): 46 — ABNORMAL LOW
Glucose: 213 mg/dL — ABNORMAL HIGH (ref 65–99)
Osmolality: 282 (ref 275–301)
Potassium: 4.2 mmol/L (ref 3.5–5.1)
Sodium: 137 mmol/L (ref 136–145)

## 2013-02-16 LAB — CBC WITH DIFFERENTIAL/PLATELET
Basophil #: 0 10*3/uL (ref 0.0–0.1)
Basophil %: 0.1 %
Eosinophil #: 0 10*3/uL (ref 0.0–0.7)
Eosinophil %: 0 %
HCT: 39.3 % — ABNORMAL LOW (ref 40.0–52.0)
HGB: 13.6 g/dL (ref 13.0–18.0)
Lymphocyte #: 0.5 10*3/uL — ABNORMAL LOW (ref 1.0–3.6)
Lymphocyte %: 7.2 %
MCH: 31.3 pg (ref 26.0–34.0)
MCHC: 34.7 g/dL (ref 32.0–36.0)
MCV: 90 fL (ref 80–100)
Monocyte #: 0.2 x10 3/mm (ref 0.2–1.0)
Monocyte %: 2.5 %
Neutrophil #: 6.1 10*3/uL (ref 1.4–6.5)
Neutrophil %: 90.2 %
Platelet: 164 10*3/uL (ref 150–440)
RBC: 4.35 10*6/uL — ABNORMAL LOW (ref 4.40–5.90)
RDW: 13.7 % (ref 11.5–14.5)
WBC: 6.7 10*3/uL (ref 3.8–10.6)

## 2013-02-17 DIAGNOSIS — J984 Other disorders of lung: Secondary | ICD-10-CM

## 2013-02-17 LAB — BASIC METABOLIC PANEL
Anion Gap: 7 (ref 7–16)
BUN: 27 mg/dL — ABNORMAL HIGH (ref 7–18)
Calcium, Total: 9.2 mg/dL (ref 8.5–10.1)
Chloride: 102 mmol/L (ref 98–107)
Co2: 26 mmol/L (ref 21–32)
Creatinine: 1.42 mg/dL — ABNORMAL HIGH (ref 0.60–1.30)
EGFR (African American): 56 — ABNORMAL LOW
EGFR (Non-African Amer.): 48 — ABNORMAL LOW
Glucose: 274 mg/dL — ABNORMAL HIGH (ref 65–99)
Osmolality: 285 (ref 275–301)
Potassium: 4.1 mmol/L (ref 3.5–5.1)
Sodium: 135 mmol/L — ABNORMAL LOW (ref 136–145)

## 2013-03-01 ENCOUNTER — Ambulatory Visit (INDEPENDENT_AMBULATORY_CARE_PROVIDER_SITE_OTHER): Payer: Medicare Other | Admitting: Internal Medicine

## 2013-03-01 ENCOUNTER — Encounter: Payer: Self-pay | Admitting: Internal Medicine

## 2013-03-01 VITALS — BP 134/78 | HR 67 | Temp 96.8°F | Wt 185.9 lb

## 2013-03-01 DIAGNOSIS — R0602 Shortness of breath: Secondary | ICD-10-CM

## 2013-03-01 DIAGNOSIS — R5383 Other fatigue: Secondary | ICD-10-CM

## 2013-03-01 DIAGNOSIS — R5381 Other malaise: Secondary | ICD-10-CM

## 2013-03-01 DIAGNOSIS — I10 Essential (primary) hypertension: Secondary | ICD-10-CM

## 2013-03-01 LAB — CBC WITH DIFFERENTIAL/PLATELET
Basophils Absolute: 0 10*3/uL (ref 0.0–0.1)
Basophils Relative: 0 % (ref 0–1)
Eosinophils Absolute: 0.1 10*3/uL (ref 0.0–0.7)
Eosinophils Relative: 1 % (ref 0–5)
HCT: 43 % (ref 39.0–52.0)
Hemoglobin: 15 g/dL (ref 13.0–17.0)
Lymphocytes Relative: 22 % (ref 12–46)
Lymphs Abs: 1.8 10*3/uL (ref 0.7–4.0)
MCH: 31 pg (ref 26.0–34.0)
MCHC: 34.9 g/dL (ref 30.0–36.0)
MCV: 88.8 fL (ref 78.0–100.0)
Monocytes Absolute: 0.7 10*3/uL (ref 0.1–1.0)
Monocytes Relative: 9 % (ref 3–12)
Neutro Abs: 5.3 10*3/uL (ref 1.7–7.7)
Neutrophils Relative %: 68 % (ref 43–77)
Platelets: 166 10*3/uL (ref 150–400)
RBC: 4.84 MIL/uL (ref 4.22–5.81)
RDW: 14.4 % (ref 11.5–15.5)
WBC: 7.9 10*3/uL (ref 4.0–10.5)

## 2013-03-01 LAB — COMPLETE METABOLIC PANEL WITH GFR
ALT: 33 U/L (ref 0–53)
AST: 20 U/L (ref 0–37)
Albumin: 4.1 g/dL (ref 3.5–5.2)
Alkaline Phosphatase: 218 U/L — ABNORMAL HIGH (ref 39–117)
BUN: 28 mg/dL — ABNORMAL HIGH (ref 6–23)
CO2: 28 mEq/L (ref 19–32)
Calcium: 9.6 mg/dL (ref 8.4–10.5)
Chloride: 96 mEq/L (ref 96–112)
Creat: 1.69 mg/dL — ABNORMAL HIGH (ref 0.50–1.35)
GFR, Est African American: 45 mL/min — ABNORMAL LOW
GFR, Est Non African American: 39 mL/min — ABNORMAL LOW
Glucose, Bld: 243 mg/dL — ABNORMAL HIGH (ref 70–99)
Potassium: 3.9 mEq/L (ref 3.5–5.3)
Sodium: 134 mEq/L — ABNORMAL LOW (ref 135–145)
Total Bilirubin: 0.4 mg/dL (ref 0.3–1.2)
Total Protein: 6.6 g/dL (ref 6.0–8.3)

## 2013-03-01 LAB — TSH: TSH: 1.458 u[IU]/mL (ref 0.350–4.500)

## 2013-03-01 MED ORDER — FLUTICASONE-SALMETEROL 250-50 MCG/DOSE IN AEPB: 1.0000 | INHALATION_SPRAY | Freq: Two times a day (BID) | RESPIRATORY_TRACT | Status: DC

## 2013-03-01 MED ORDER — ALBUTEROL SULFATE HFA 108 (90 BASE) MCG/ACT IN AERS: 2.0000 | INHALATION_SPRAY | Freq: Four times a day (QID) | RESPIRATORY_TRACT | Status: DC | PRN

## 2013-03-01 MED ORDER — AMLODIPINE BESYLATE 10 MG PO TABS: 10.0000 mg | ORAL_TABLET | Freq: Every day | ORAL | Status: DC

## 2013-03-01 MED ORDER — HYDROCHLOROTHIAZIDE 25 MG PO TABS: 25.0000 mg | ORAL_TABLET | Freq: Every day | ORAL | Status: DC

## 2013-03-01 MED ORDER — DOXAZOSIN MESYLATE 2 MG PO TABS: 4.0000 mg | ORAL_TABLET | Freq: Every day | ORAL | Status: DC

## 2013-03-01 NOTE — Progress Notes (Signed)
  Subjective:    Patient ID: Zachary Santos, male    DOB: 1936/04/10, 77 y.o.   MRN: [id number]  HPI States two weeks ago he was at his house and didn't feel well. He states he felt fatigued and weak.  He states he felt somewhat short of breath. He was admitted at [facility] recently for a suspected COPD exacerbation.  States he is still using cigarettes, but is cutting back.  He states he is now using Advair almost daily and albuterol as needed, which was prescribed on discharge. He states he feels "low on energy".  Denies CP. Denies cough. Denies N/V/D/C. Denies abdominal pain. States SOB is better.   Review of Systems Complete 12 point review of systems is otherwise negative except for that stated in the HPI.    Objective:   Physical Exam Filed Vitals:   03/01/13 0817  BP: 134/78  Pulse: 67  Temp: 96.8 F (36 C)   GEN: AAOx3, NAD. HEENT: EOMI, PERRL, no icterus, no adenopathy. CV: S1S2, no m/r/g, RRR PULM:Decreased breath sounds. ABD/GI: Soft, NT, +BS, no bruit, no distention, no masses. LE/UE: 2/4 pulses, no c/c/e.  NEURO: CN II-XII intact, no focal deficits.     Assessment & Plan:  75 yr. Old WM w/ hx questionable CAD, questionable COPD, HTN, BPH, Paget's disease, ED, presents for follow up visit.  1) HTN: Well controlled. 2) BPH: PSA was checked at his request on last visit. He denies any urinary symptoms. Cont alpha-blocker.  3) Tobacco abuse: States not fully ready to quit.  He will continue trying to quit. Educated him on risks of chronic tobacco use, including malignancy, lung disease, cardiovascular effects including heart attack and stroke. 4) Possible COPD: Obtain PFT's. Cont Advair and albuterol at this time. 4) ED: Cialis10 mg. Explained how to use drug and side effects. He has not required this medication recently.  5) Paget's disease: No current treatment, he is not following up with anyone but [place]. Asymptomatic.  6) Fatigue: Likely a result of recent exacerbation.  Will check TSH, CMP due to pagets, CBC.  Check total testosterone. 7) Health Maintenance: States had pneumovax in past 5 years. Refuses colonoscopy. FOBT states he sent, but we never received.  We will give him another set of FOBT cards. He will need AAA screening with one time abdominal U/S on next visit. -Follow up with me in three months.

## 2013-03-01 NOTE — Patient Instructions (Addendum)
Blood work today. We will refer you to get lung tests done. Continue all your current medications. Return sooner if needed.

## 2013-03-02 LAB — TESTOSTERONE: Testosterone: 246 ng/dL — ABNORMAL LOW (ref 300–890)

## 2013-03-07 ENCOUNTER — Inpatient Hospital Stay (HOSPITAL_COMMUNITY): Admission: RE | Admit: 2013-03-07 | Payer: Medicare Other | Source: Ambulatory Visit

## 2013-06-07 ENCOUNTER — Ambulatory Visit (INDEPENDENT_AMBULATORY_CARE_PROVIDER_SITE_OTHER): Payer: Medicare Other | Admitting: Internal Medicine

## 2013-06-07 ENCOUNTER — Encounter: Payer: Medicare Other | Admitting: Internal Medicine

## 2013-06-07 ENCOUNTER — Encounter: Payer: Self-pay | Admitting: Internal Medicine

## 2013-06-07 VITALS — BP 115/71 | HR 57 | Temp 96.4°F | Ht 72.0 in | Wt 187.7 lb

## 2013-06-07 DIAGNOSIS — M1710 Unilateral primary osteoarthritis, unspecified knee: Secondary | ICD-10-CM

## 2013-06-07 DIAGNOSIS — IMO0002 Reserved for concepts with insufficient information to code with codable children: Secondary | ICD-10-CM

## 2013-06-07 DIAGNOSIS — M1711 Unilateral primary osteoarthritis, right knee: Secondary | ICD-10-CM | POA: Insufficient documentation

## 2013-06-07 DIAGNOSIS — F17200 Nicotine dependence, unspecified, uncomplicated: Secondary | ICD-10-CM

## 2013-06-07 DIAGNOSIS — L258 Unspecified contact dermatitis due to other agents: Secondary | ICD-10-CM

## 2013-06-07 DIAGNOSIS — I10 Essential (primary) hypertension: Secondary | ICD-10-CM

## 2013-06-07 MED ORDER — HYDROCHLOROTHIAZIDE 25 MG PO TABS: 25.0000 mg | ORAL_TABLET | Freq: Every day | ORAL | Status: DC

## 2013-06-07 MED ORDER — FLUTICASONE-SALMETEROL 250-50 MCG/DOSE IN AEPB: 1.0000 | INHALATION_SPRAY | Freq: Two times a day (BID) | RESPIRATORY_TRACT | Status: DC

## 2013-06-07 MED ORDER — DOXAZOSIN MESYLATE 2 MG PO TABS: 4.0000 mg | ORAL_TABLET | Freq: Every day | ORAL | Status: DC

## 2013-06-07 MED ORDER — TRIAMCINOLONE ACETONIDE 0.025 % EX OINT: TOPICAL_OINTMENT | Freq: Four times a day (QID) | CUTANEOUS | Status: DC | PRN

## 2013-06-07 MED ORDER — AMLODIPINE BESYLATE 10 MG PO TABS: 10.0000 mg | ORAL_TABLET | Freq: Every day | ORAL | Status: DC

## 2013-06-07 NOTE — Assessment & Plan Note (Signed)
  Assessment: Progress toward smoking cessation:  smoking the same amount Barriers to progress toward smoking cessation:  lack of motivation to quit;other tobacco users at home  Plan: Instruction/counseling given:  I counseled patient on the dangers of tobacco use, advised patient to stop smoking, and reviewed strategies to maximize success. Medications to assist with smoking cessation:  None Other plans: continue to assess readiness to quit

## 2013-06-07 NOTE — Assessment & Plan Note (Addendum)
Pt has been experiencing right knee pain for the past 6 months that is worse when he walks for long periods of time and continues bothering him at rest.  He describes the pain as achy.  He denies any leg or calf pain.  He has not tried anything for the pain because he doesn't like to take medicine.  He states it is interfering with his ability to play golf.  He denies any fever, chills, N/V, erythema or swelling of the knee.  He has no h/o gout, trauma or surgery on the knee.  On physical exam there is no deformity, erythema, effusion, or swelling of the knee.  There is nl ROM and no crepitus.  There is slight tenderness of the medial aspect.  DP pulses are 2+bilaterally.  Possibilities include: osteoarthritis, RA, gout, septic arthritis.  OA is likely due to his meeting 3 of the classical criteria: age >57 yo,bony tenderness, and no palpable warmth.  Additionally, Paget disease (which the pt has) is a risk factor for OA.  Gout is unlikely as there is no swelling or erythema and no h/o gout.  Septic arthritis is unlikely given the lack of acute onset, swelling, erythema, warmth, or restricted ROM.  RA is unlikely given that pt's pain gets worse throughout the day and with movement.    -APAP 1000mg  every 6 hours as needed for pain; not to exceed 4000mg  per 24 hour period

## 2013-06-07 NOTE — Assessment & Plan Note (Signed)
BP Readings from Last 3 Encounters:  06/07/13 115/71  03/01/13 134/78  07/20/12 117/71    Lab Results  Component Value Date   NA 134* 03/01/2013   K 3.9 03/01/2013   CREATININE 1.69* 03/01/2013    Assessment: Blood pressure control: controlled Progress toward BP goal:  at goal  Plan: Medications:  continue current medications

## 2013-06-07 NOTE — Progress Notes (Signed)
Patient ID: Zachary Santos, Santos   DOB: [birth year], 77 y.o.   MRN: [id number]    Subjective:   Patient ID: Zachary Santos   DOB: 1935-10-23 77 y.o.   MRN: [id number]  HPI: [name] is a 77 y.o. here for a follow up visit.  He is a patient of Dr. Kem Kays and has a PMH outlined below.  He c/o of a chronic rash on his ankles and right knee pain.  He also needs medication refilled.  Please see problem based assessment and plan below for further details of his current complaints.    Past Medical History  Diagnosis Date  . Chronic fatigue   . Stable angina   . COPD (chronic obstructive pulmonary disease)   . Hypertension   . Acute renal disease   . Paget's disease   . Tobacco abuse    Current Outpatient Prescriptions  Medication Sig Dispense Refill  . albuterol (PROVENTIL HFA;VENTOLIN HFA) 108 (90 BASE) MCG/ACT inhaler Inhale 2 puffs into the lungs every 6 (six) hours as needed for wheezing.  8.5 g  3  . amLODipine (NORVASC) 10 MG tablet Take 1 tablet (10 mg total) by mouth daily.  90 tablet  3  . aspirin 81 MG EC tablet Take 1 tablet (81 mg total) by mouth daily.  30 tablet  6  . doxazosin (CARDURA) 2 MG tablet Take 2 tablets (4 mg total) by mouth at bedtime.  90 tablet  3  . Fluticasone-Salmeterol (ADVAIR) 250-50 MCG/DOSE AEPB Inhale 1 puff into the lungs every 12 (twelve) hours.  14 each  2  . hydrochlorothiazide (HYDRODIURIL) 25 MG tablet Take 1 tablet (25 mg total) by mouth daily.  90 tablet  3  . tadalafil (CIALIS) 10 MG tablet Take 1 tablet (10 mg total) by mouth daily as needed.  10 tablet  2  . triamcinolone (KENALOG) 0.025 % ointment Apply topically 4 (four) times daily as needed.  30 g  3   No current facility-administered medications for this visit.   No family history on file. History   Social History  . Marital Status: Married    Spouse Name: N/A    Number of Children: N/A  . Years of Education: N/A   Social History Main Topics  . Smoking status: Current Every  Day Smoker -- 1.00 packs/day for 60 years    Types: Cigarettes  . Smokeless tobacco: None     Comment: cutting back  . Alcohol Use: Yes  . Drug Use: None  . Sexual Activity: None   Other Topics Concern  . None   Social History Narrative  . None   Review of Systems: Pertinent items are noted in HPI.  Objective:  Physical Exam: Filed Vitals:   06/07/13 0917  BP: 115/71  Pulse: 57  Temp: 96.4 F (35.8 C)  TempSrc: Oral  Height: 6' (1.829 m)  Weight: 187 lb 11.2 oz (85.14 kg)  SpO2: 97%   Physical Exam  Constitutional: He is oriented to person, place, and time and well-developed, well-nourished, and in no distress.  HENT:  Head: Normocephalic and atraumatic.  Eyes: Conjunctivae and EOM are normal. Pupils are equal, round, and reactive to light.  Neck: Neck supple.  Cardiovascular: Normal rate, normal heart sounds and intact distal pulses.   Pulmonary/Chest: No accessory muscle usage. No respiratory distress. He has decreased breath sounds. He has no wheezes.  Abdominal: Soft. Bowel sounds are normal. There is no tenderness.  Musculoskeletal:  Right knee: He exhibits normal range of motion, no swelling, no effusion and no deformity. No tenderness found.       Left knee: Normal.  Neurological: He is alert and oriented to person, place, and time.  Skin: Skin is warm and dry. Rash noted. Rash is papular and urticarial.    Assessment & Plan:

## 2013-06-07 NOTE — Assessment & Plan Note (Signed)
Pt has been experiencing a chronic pruritic rash confined to the ankles/top of feet bilaterally that has been addressed previously by a dermatologist.  He reports a dermatologist diagnosed him with "internal dermatitis" years ago and was given a hydroxycortisone cream in a jar that helped.  He denies any contact with any items such as grass, etc., but pt does not wear socks.  He is outdoors a lot and enjoys golf.  He denies any fever/chills or any other systemic symptoms.  On exam, there are small pruritic papules on an erythematous base located on the ankles and top of feet b/l.  There are no excoriations, pustules, or signs of infection.  Possibilities include: contact dermatitis, atopic dermatitis, stasis dermatitis.  It is unlikely atopic dermatitis which is typically confined to the flexural surfaces, face/hands; stasis dermatitis is unlikely due to lack of skin discoloration.  Contact dermatitis is more likely due to the rash distribution only involving the ankles.  -topical triamcinolone cream 0.025% applied 2-4 times daily as needed  -use mild soap such as dove and avoid using harsh soaps and wash cloth to the area -avoid harsh detergents and try ALL free and clear detergent or other dye-free, detergent-free detergents -wear soaks when walking outdoors to avoid contact with potential allergens

## 2013-06-07 NOTE — Patient Instructions (Addendum)
Return to clinic in 3 months with Dr. Kem Kays  1. I have sent a prescription to your pharmacy for a steroid cream for your dermatitis 2. You may take 1000mg  of tylenol every 6 hours for knee pain as needed; do not take more than 4000mg  per day   Contact Dermatitis Contact dermatitis is a rash that happens when something touches the skin. You touched something that irritates your skin, or you have allergies to something you touched. HOME CARE   Avoid the thing that caused your rash.  Keep your rash away from hot water, soap, sunlight, chemicals, and other things that might bother it.  Do not scratch your rash.  You can take cool baths to help stop itching.  Only take medicine as told by your doctor.  Keep all doctor visits as told. GET HELP RIGHT AWAY IF:   Your rash is not better after 3 days.  Your rash gets worse.  Your rash is puffy (swollen), tender, red, sore, or warm.  You have problems with your medicine. MAKE SURE YOU:   Understand these instructions.  Will watch your condition.  Will get help right away if you are not doing well or get worse. Document Released: 08/01/2009 Document Revised: 12/27/2011 Document Reviewed: 03/09/2011 [facility] Patient Information 2014 [place], [place].

## 2013-06-10 NOTE — Progress Notes (Signed)
I saw and evaluated the patient. I personally confirmed the key portions of Dr. Delane Ginger history and exam and reviewed pertinent patient test results. The assessment, diagnosis, and plan were formulated together and I agree with the documentation in the resident's note.

## 2013-08-30 ENCOUNTER — Ambulatory Visit: Payer: Medicare Other | Admitting: Internal Medicine

## 2013-09-06 ENCOUNTER — Other Ambulatory Visit (HOSPITAL_COMMUNITY): Payer: Self-pay | Admitting: Internal Medicine

## 2013-09-06 ENCOUNTER — Ambulatory Visit (INDEPENDENT_AMBULATORY_CARE_PROVIDER_SITE_OTHER): Payer: Medicare Other | Admitting: Internal Medicine

## 2013-09-06 ENCOUNTER — Encounter: Payer: Self-pay | Admitting: Internal Medicine

## 2013-09-06 VITALS — BP 137/74 | HR 65 | Temp 96.7°F | Ht 72.0 in | Wt 192.5 lb

## 2013-09-06 DIAGNOSIS — N183 Chronic kidney disease, stage 3 unspecified: Secondary | ICD-10-CM

## 2013-09-06 DIAGNOSIS — R739 Hyperglycemia, unspecified: Secondary | ICD-10-CM

## 2013-09-06 DIAGNOSIS — R5381 Other malaise: Secondary | ICD-10-CM

## 2013-09-06 DIAGNOSIS — Z23 Encounter for immunization: Secondary | ICD-10-CM

## 2013-09-06 DIAGNOSIS — F17200 Nicotine dependence, unspecified, uncomplicated: Secondary | ICD-10-CM

## 2013-09-06 DIAGNOSIS — Z720 Tobacco use: Secondary | ICD-10-CM

## 2013-09-06 DIAGNOSIS — R7309 Other abnormal glucose: Secondary | ICD-10-CM

## 2013-09-06 DIAGNOSIS — R0602 Shortness of breath: Secondary | ICD-10-CM

## 2013-09-06 DIAGNOSIS — Z0000 Encounter for general adult medical examination without abnormal findings: Secondary | ICD-10-CM

## 2013-09-06 LAB — HEMOGLOBIN A1C
Hgb A1c MFr Bld: 7.8 % — ABNORMAL HIGH (ref <5.7)
Mean Plasma Glucose: 177 mg/dL — ABNORMAL HIGH (ref <117)

## 2013-09-06 NOTE — Progress Notes (Signed)
Subjective:    Patient ID: Zachary Santos, male    DOB: [birth year], 77 y.o.   MRN: [id number]  HPI States he's had some right knee pain occasionally for about 2-3 months. He denies any history of fall or trauma. He states that the pain is worsened with certain positions and with carrying heavy objects. Denies any swelling or redness of the joint. No fever, no chills. States no SOB.  He was not able to get PFTs done. He continues to smoke cigarettes and is not ready to quit.   Review of Systems  Constitutional: Negative for fever, chills, appetite change and unexpected weight change.  HENT: Negative for voice change.   Respiratory: Negative for chest tightness, shortness of breath and wheezing.   Gastrointestinal: Negative for vomiting, abdominal pain, constipation, blood in stool, abdominal distention and rectal pain.  Endocrine: Negative for polydipsia and polyphagia.  Musculoskeletal: Positive for arthralgias. Negative for gait problem and joint swelling.  Neurological: Negative for dizziness, syncope and speech difficulty.  Psychiatric/Behavioral: Negative for confusion.       Objective:   Physical Exam  Constitutional: He is oriented to person, place, and time. He appears well-developed and well-nourished. No distress.  HENT:  Head: Normocephalic and atraumatic.  Eyes: Conjunctivae and EOM are normal. Pupils are equal, round, and reactive to light. Right eye exhibits no discharge. Left eye exhibits no discharge.  Neck: Normal range of motion. Neck supple. No JVD present. No tracheal deviation present. No thyromegaly present.  Cardiovascular: Normal rate, regular rhythm and normal heart sounds.  Exam reveals no friction rub.   No murmur heard. Pulmonary/Chest: Effort normal and breath sounds normal. No respiratory distress. He has no wheezes.  Abdominal: Soft. Bowel sounds are normal. He exhibits no distension. There is no tenderness.  Musculoskeletal: Normal range of motion.   Right knee: He exhibits no swelling, no effusion, no bony tenderness and no MCL laxity. Tenderness found. Medial joint line tenderness noted.  Lymphadenopathy:    He has no cervical adenopathy.  Neurological: He is alert and oriented to person, place, and time. He has normal reflexes. He displays normal reflexes. No cranial nerve deficit. Coordination normal.  Skin: Skin is warm. He is not diaphoretic.  Psychiatric: He has a normal mood and affect. His behavior is normal. Judgment and thought content normal.   Filed Vitals:   09/06/13 1438  BP: 137/74  Pulse: 65  Temp: 96.7 F (35.9 C)       Assessment & Plan:  75 yr. Old WM w/ hx questionable CAD, questionable COPD, HTN, BPH, Paget's disease, ED, CKD 3, presents for follow up visit.  1) HTN: Well controlled.  2) BPH: PSA was checked at his request on last visit. He denies any urinary symptoms. Cont alpha-blocker. PSA was 2.50. 3) Tobacco abuse: States not fully ready to quit. He will continue trying to quit. Educated him on risks of chronic tobacco use, including malignancy, lung disease, cardiovascular effects including heart attack and stroke.  4) Possible COPD: Needs PFTs, will reschedule them as he could not get them done last time. 5) ED: Unable to afford Cialis. Viagra is also not covered, but he states he paid for it last time for the dose he took did not work. Will recheck testosterone level. 6) Paget's disease: No current treatment, he is not following up with anyone but [place]. Asymptomatic.  7) CKD 3: Check CMET. Stable in past.  8) Hyperglycemia: Check HgbA1C.  9) Joint Pain Right knee: I don't  see evidence of ligamental laxity. No evidence of fracture.  No history of trauma. Will monitor for worsening and if this does worse will pursue imaging with MRI or referral to sports medicine. 10) Health Maintenance: States had pneumovax in past 5 years. Refuses colonoscopy. FOBT states he sent, but we never received, will give cards  again. AAA screen. Flu vaccine. Shingles vaccine to pharmacy. -Follow up with me in three months.   Jonah Blue, DO, FACP Faculty [facility] Internal Medicine Residency Program 09/06/2013, 3:12 PM

## 2013-09-06 NOTE — Patient Instructions (Addendum)
-  Blood work today. -Shingles vaccine sent to your pharmacy. -Remember to get your lung function test and abdominal ultrasound in January. -Remember to bring in your stool cards. -Return in 3 months.

## 2013-09-07 LAB — COMPREHENSIVE METABOLIC PANEL
ALT: 20 U/L (ref 0–53)
AST: 17 U/L (ref 0–37)
Albumin: 4.5 g/dL (ref 3.5–5.2)
Alkaline Phosphatase: 200 U/L — ABNORMAL HIGH (ref 39–117)
BUN: 17 mg/dL (ref 6–23)
CO2: 29 mEq/L (ref 19–32)
Calcium: 9.7 mg/dL (ref 8.4–10.5)
Chloride: 101 mEq/L (ref 96–112)
Creat: 1.4 mg/dL — ABNORMAL HIGH (ref 0.50–1.35)
Glucose, Bld: 219 mg/dL — ABNORMAL HIGH (ref 70–99)
Potassium: 4.3 mEq/L (ref 3.5–5.3)
Sodium: 142 mEq/L (ref 135–145)
Total Bilirubin: 0.4 mg/dL (ref 0.3–1.2)
Total Protein: 6.7 g/dL (ref 6.0–8.3)

## 2013-09-07 LAB — TESTOSTERONE: Testosterone: 386 ng/dL (ref 300–890)

## 2013-09-12 ENCOUNTER — Other Ambulatory Visit: Payer: Self-pay | Admitting: Internal Medicine

## 2013-09-12 DIAGNOSIS — E1165 Type 2 diabetes mellitus with hyperglycemia: Secondary | ICD-10-CM | POA: Insufficient documentation

## 2013-09-12 DIAGNOSIS — E119 Type 2 diabetes mellitus without complications: Secondary | ICD-10-CM

## 2013-09-12 MED ORDER — GLIPIZIDE ER 2.5 MG PO TB24: 2.5000 mg | ORAL_TABLET | Freq: Every day | ORAL | Status: DC

## 2013-09-12 NOTE — Progress Notes (Signed)
Hi [name], yes if possible have the front desk call him to schedule appt with you. Where do I put the referral, in Epic?

## 2013-09-12 NOTE — Progress Notes (Signed)
The referral is in this note in meds and orders. It needs to be associated with his diagnosis of diabetes from the problem list  and signed. Thank you!

## 2013-09-27 ENCOUNTER — Encounter: Payer: Medicare Other | Admitting: Dietician

## 2013-09-27 ENCOUNTER — Ambulatory Visit: Payer: Medicare Other | Admitting: Internal Medicine

## 2013-10-22 ENCOUNTER — Encounter (HOSPITAL_COMMUNITY): Payer: Medicare Other

## 2013-10-22 ENCOUNTER — Ambulatory Visit (HOSPITAL_COMMUNITY): Payer: Medicare Other

## 2013-12-06 ENCOUNTER — Ambulatory Visit: Payer: Medicare Other | Admitting: Internal Medicine

## 2013-12-06 ENCOUNTER — Other Ambulatory Visit: Payer: Self-pay | Admitting: *Deleted

## 2013-12-06 ENCOUNTER — Encounter: Payer: Medicare Other | Admitting: Dietician

## 2013-12-06 NOTE — Telephone Encounter (Signed)
Message sent to front office to schedule pt an appt. 

## 2013-12-07 ENCOUNTER — Other Ambulatory Visit: Payer: Self-pay | Admitting: Internal Medicine

## 2014-01-25 ENCOUNTER — Encounter: Payer: Self-pay | Admitting: *Deleted

## 2014-02-28 ENCOUNTER — Ambulatory Visit: Payer: Medicare Other | Admitting: Internal Medicine

## 2014-02-28 ENCOUNTER — Encounter: Payer: Medicare Other | Admitting: Dietician

## 2014-03-19 ENCOUNTER — Other Ambulatory Visit: Payer: Self-pay | Admitting: Internal Medicine

## 2014-04-18 ENCOUNTER — Ambulatory Visit (INDEPENDENT_AMBULATORY_CARE_PROVIDER_SITE_OTHER): Payer: Medicare Other | Admitting: Internal Medicine

## 2014-04-18 ENCOUNTER — Encounter: Payer: Self-pay | Admitting: Internal Medicine

## 2014-04-18 VITALS — BP 120/73 | HR 56 | Temp 96.4°F | Ht 72.0 in | Wt 188.7 lb

## 2014-04-18 DIAGNOSIS — I10 Essential (primary) hypertension: Secondary | ICD-10-CM

## 2014-04-18 DIAGNOSIS — N289 Disorder of kidney and ureter, unspecified: Secondary | ICD-10-CM

## 2014-04-18 DIAGNOSIS — F17200 Nicotine dependence, unspecified, uncomplicated: Secondary | ICD-10-CM

## 2014-04-18 DIAGNOSIS — J449 Chronic obstructive pulmonary disease, unspecified: Secondary | ICD-10-CM

## 2014-04-18 DIAGNOSIS — I129 Hypertensive chronic kidney disease with stage 1 through stage 4 chronic kidney disease, or unspecified chronic kidney disease: Secondary | ICD-10-CM

## 2014-04-18 DIAGNOSIS — R21 Rash and other nonspecific skin eruption: Secondary | ICD-10-CM

## 2014-04-18 DIAGNOSIS — N189 Chronic kidney disease, unspecified: Secondary | ICD-10-CM

## 2014-04-18 DIAGNOSIS — E785 Hyperlipidemia, unspecified: Secondary | ICD-10-CM

## 2014-04-18 DIAGNOSIS — E119 Type 2 diabetes mellitus without complications: Secondary | ICD-10-CM

## 2014-04-18 DIAGNOSIS — Z0000 Encounter for general adult medical examination without abnormal findings: Secondary | ICD-10-CM

## 2014-04-18 LAB — LIPID PANEL
Cholesterol: 140 mg/dL (ref 0–200)
HDL: 24 mg/dL — ABNORMAL LOW (ref >39)
LDL Cholesterol: 78 mg/dL (ref 0–99)
Total CHOL/HDL Ratio: 5.8 Ratio
Triglycerides: 191 mg/dL — ABNORMAL HIGH (ref <150)
VLDL: 38 mg/dL (ref 0–40)

## 2014-04-18 LAB — BASIC METABOLIC PANEL WITH GFR
BUN: 22 mg/dL (ref 6–23)
CO2: 29 mEq/L (ref 19–32)
Calcium: 9.4 mg/dL (ref 8.4–10.5)
Chloride: 99 mEq/L (ref 96–112)
Creat: 1.71 mg/dL — ABNORMAL HIGH (ref 0.50–1.35)
GFR, Est African American: 44 mL/min — ABNORMAL LOW
GFR, Est Non African American: 38 mL/min — ABNORMAL LOW
Glucose, Bld: 173 mg/dL — ABNORMAL HIGH (ref 70–99)
Potassium: 3.7 mEq/L (ref 3.5–5.3)
Sodium: 138 mEq/L (ref 135–145)

## 2014-04-18 LAB — GLUCOSE, CAPILLARY: Glucose-Capillary: 180 mg/dL — ABNORMAL HIGH (ref 70–99)

## 2014-04-18 LAB — POCT GLYCOSYLATED HEMOGLOBIN (HGB A1C): Hemoglobin A1C: 7.4

## 2014-04-18 MED ORDER — AMLODIPINE BESYLATE 10 MG PO TABS: 10.0000 mg | ORAL_TABLET | Freq: Every day | ORAL | Status: DC

## 2014-04-18 MED ORDER — GLIPIZIDE ER 2.5 MG PO TB24: 2.5000 mg | ORAL_TABLET | Freq: Every day | ORAL | Status: DC

## 2014-04-18 MED ORDER — ALBUTEROL SULFATE HFA 108 (90 BASE) MCG/ACT IN AERS: 2.0000 | INHALATION_SPRAY | Freq: Four times a day (QID) | RESPIRATORY_TRACT | Status: DC | PRN

## 2014-04-18 MED ORDER — TRIAMCINOLONE ACETONIDE 0.025 % EX OINT: TOPICAL_OINTMENT | Freq: Four times a day (QID) | CUTANEOUS | Status: DC | PRN

## 2014-04-18 MED ORDER — HYDROCHLOROTHIAZIDE 25 MG PO TABS: 25.0000 mg | ORAL_TABLET | Freq: Every day | ORAL | Status: DC

## 2014-04-18 MED ORDER — DOXAZOSIN MESYLATE 2 MG PO TABS: ORAL_TABLET | ORAL | Status: DC

## 2014-04-18 MED ORDER — ASPIRIN 81 MG PO TBEC: 81.0000 mg | DELAYED_RELEASE_TABLET | Freq: Every day | ORAL | Status: DC

## 2014-04-18 MED ORDER — FLUTICASONE-SALMETEROL 250-50 MCG/DOSE IN AEPB: 1.0000 | INHALATION_SPRAY | Freq: Two times a day (BID) | RESPIRATORY_TRACT | Status: DC

## 2014-04-18 MED ORDER — LISINOPRIL-HYDROCHLOROTHIAZIDE 10-12.5 MG PO TABS: 1.0000 | ORAL_TABLET | Freq: Every day | ORAL | Status: DC

## 2014-04-18 NOTE — Progress Notes (Signed)
   Subjective:    Patient ID: Zachary Santos, male    DOB: 29-Dec-1935, 78 y.o.   MRN: [id number]  HPI 78 year old male with PMH of DM tyoe 2, CKD, likely COPD, osteoarthritis, HLD, HTN presents for routine follow up today. Pt complains of mild R knee pain which is chronic secondary to OA. No change in pain and patient is able to ambulate independently Pt also complains of fatigue with mild exertion and wants to join an exercise program near his house to help build himself up. He also complains of mild DOE which he attributes to smoking and is chronic per patient. No worsening for now Patient does not want to give up smoking at this time nor does he want to do any colon cancer screening even though he was advised of the benefits of it.     Review of Systems  Constitutional: Positive for fatigue. Negative for fever, chills, activity change, appetite change and unexpected weight change.  HENT: Negative.   Eyes: Negative.   Respiratory: Positive for shortness of breath. Negative for choking, chest tightness, wheezing and stridor.   Cardiovascular: Negative.   Gastrointestinal: Negative.   Musculoskeletal: Positive for arthralgias and back pain. Negative for joint swelling, myalgias, neck pain and neck stiffness.  Skin: Negative.   Neurological: Negative.        Objective:   Physical Exam  Constitutional: He is oriented to person, place, and time. He appears well-developed and well-nourished.  HENT:  Head: Normocephalic and atraumatic.  Neck: Normal range of motion. Neck supple.  Cardiovascular: Normal rate, regular rhythm and normal heart sounds.   Pulmonary/Chest: Effort normal and breath sounds normal. He has no wheezes. He has no rales.  Abdominal: Soft. Bowel sounds are normal. He exhibits no distension. There is no tenderness.  Musculoskeletal: Normal range of motion. He exhibits no edema and no tenderness.  Neurological: He is alert and oriented to person, place, and time.  Skin:  Skin is warm and dry.  Psychiatric: He has a normal mood and affect.          Assessment & Plan:  Please see problem based charting for assessment and plan

## 2014-04-18 NOTE — Assessment & Plan Note (Addendum)
Pt with likely CKD secondary to HTN and diabetes Will check repeat BMP today and follow up Will need repeat BMP checked in 1 month after starting lisinopril/HCTZ combination

## 2014-04-18 NOTE — Assessment & Plan Note (Signed)
Lab Results  Component Value Date   HGBA1C 7.4 04/18/2014   HGBA1C 7.8* 09/06/2013   HGBA1C  Value: 6.4 (NOTE)   The ADA recommends the following therapeutic goals for glycemic   control related to Hgb A1C measurement:   Goal of Therapy:   < 7.0% Hgb A1C   Action Suggested:  > 8.0% Hgb A1C   Ref:  Diabetes Care, 22, Suppl. 1, 1999* 02/04/2008     Assessment: Diabetes control:  fair Progress toward A1C goal:   good Comments: Patient is taking his glipizide only intermittently. Advised him to continue glipizide for now  Plan: Medications:  continue current medications Home glucose monitoring: Frequency:   Timing:   Instruction/counseling given: reminded to get eye exam Educational resources provided:   Self management tools provided:   Other plans: patient to follow up with optho and get eye exam. Foot exam done in clinic today

## 2014-04-18 NOTE — Patient Instructions (Signed)
- Your A1c is 7.4. Good job! - Please start taking your glipizide (diabetic medication) and repeat an A1C with your new PCP - I have stopped your hydrochlorthiazide and started you on a combination pill lisinopril/hydrochlorthiazide for your BP - your BP is at goal! Good job ! - I encourage you to attempt to quit smoking - Consider getting a stress test and pulmonary function tests after following up with your new PCP - Remember to schedule an eye exam with your opthalmologist - It was a pleasure meeting you today. I wish you all the best with your new PCP    Smoking Cessation Quitting smoking is important to your health and has many advantages. However, it is not always easy to quit since nicotine is a very addictive drug. Often times, people try 3 times or more before being able to quit. This document explains the best ways for you to prepare to quit smoking. Quitting takes hard work and a lot of effort, but you can do it. ADVANTAGES OF QUITTING SMOKING  You will live longer, feel better, and live better.  Your body will feel the impact of quitting smoking almost immediately.  Within 20 minutes, blood pressure decreases. Your pulse returns to its normal level.  After 8 hours, carbon monoxide levels in the blood return to normal. Your oxygen level increases.  After 24 hours, the chance of having a heart attack starts to decrease. Your breath, hair, and body stop smelling like smoke.  After 48 hours, damaged nerve endings begin to recover. Your sense of taste and smell improve.  After 72 hours, the body is virtually free of nicotine. Your bronchial tubes relax and breathing becomes easier.  After 2 to 12 weeks, lungs can hold more air. Exercise becomes easier and circulation improves.  The risk of having a heart attack, stroke, cancer, or lung disease is greatly reduced.  After 1 year, the risk of coronary heart disease is cut in half.  After 5 years, the risk of stroke falls to  the same as a nonsmoker.  After 10 years, the risk of lung cancer is cut in half and the risk of other cancers decreases significantly.  After 15 years, the risk of coronary heart disease drops, usually to the level of a nonsmoker.  If you are pregnant, quitting smoking will improve your chances of having a healthy baby.  The people you live with, especially any children, will be healthier.  You will have extra money to spend on things other than cigarettes. QUESTIONS TO THINK ABOUT BEFORE ATTEMPTING TO QUIT You may want to talk about your answers with your caregiver.  Why do you want to quit?  If you tried to quit in the past, what helped and what did not?  What will be the most difficult situations for you after you quit? How will you plan to handle them?  Who can help you through the tough times? Your family? Friends? A caregiver?  What pleasures do you get from smoking? What ways can you still get pleasure if you quit? Here are some questions to ask your caregiver:  How can you help me to be successful at quitting?  What medicine do you think would be best for me and how should I take it?  What should I do if I need more help?  What is smoking withdrawal like? How can I get information on withdrawal? GET READY  Set a quit date.  Change your environment by getting rid of  all cigarettes, ashtrays, matches, and lighters in your home, car, or work. Do not let people smoke in your home.  Review your past attempts to quit. Think about what worked and what did not. GET SUPPORT AND ENCOURAGEMENT You have a better chance of being successful if you have help. You can get support in many ways.  Tell your family, friends, and co-workers that you are going to quit and need their support. Ask them not to smoke around you.  Get individual, group, or telephone counseling and support. Programs are available at [employer] and health centers. Call your local health department for  information about programs in your area.  Spiritual beliefs and practices may help some smokers quit.  Download a "quit meter" on your computer to keep track of quit statistics, such as how long you have gone without smoking, cigarettes not smoked, and money saved.  Get a self-help book about quitting smoking and staying off of tobacco. [place] yourself from urges to smoke. Talk to someone, go for a walk, or occupy your time with a task.  Change your normal routine. Take a different route to work. Drink tea instead of coffee. Eat breakfast in a different place.  Reduce your stress. Take a hot bath, exercise, or read a book.  Plan something enjoyable to do every day. Reward yourself for not smoking.  Explore interactive web-based programs that specialize in helping you quit. GET MEDICINE AND USE IT CORRECTLY Medicines can help you stop smoking and decrease the urge to smoke. Combining medicine with the above behavioral methods and support can greatly increase your chances of successfully quitting smoking.  Nicotine replacement therapy helps deliver nicotine to your body without the negative effects and risks of smoking. Nicotine replacement therapy includes nicotine gum, lozenges, inhalers, nasal sprays, and skin patches. Some may be available over-the-counter and others require a prescription.  Antidepressant medicine helps people abstain from smoking, but how this works is unknown. This medicine is available by prescription.  Nicotinic receptor partial agonist medicine simulates the effect of nicotine in your brain. This medicine is available by prescription. Ask your caregiver for advice about which medicines to use and how to use them based on your health history. Your caregiver will tell you what side effects to look out for if you choose to be on a medicine or therapy. Carefully read the information on the package. Do not use any other product  containing nicotine while using a nicotine replacement product.  RELAPSE OR DIFFICULT SITUATIONS Most relapses occur within the first 3 months after quitting. Do not be discouraged if you start smoking again. Remember, most people try several times before finally quitting. You may have symptoms of withdrawal because your body is used to nicotine. You may crave cigarettes, be irritable, feel very hungry, cough often, get headaches, or have difficulty concentrating. The withdrawal symptoms are only temporary. They are strongest when you first quit, but they will go away within 10-14 days. To reduce the chances of relapse, try to:  Avoid drinking alcohol. Drinking lowers your chances of successfully quitting.  Reduce the amount of caffeine you consume. Once you quit smoking, the amount of caffeine in your body increases and can give you symptoms, such as a rapid heartbeat, sweating, and anxiety.  Avoid smokers because they can make you want to smoke.  Do not let weight gain distract you. Many smokers will gain weight when they quit, usually less than 10 pounds. Eat  a healthy diet and stay active. You can always lose the weight gained after you quit.  Find ways to improve your mood other than smoking. FOR MORE INFORMATION  www.smokefree.gov  Document Released: 09/28/2001 Document Revised: 04/04/2012 Document Reviewed: 01/13/2012 [facility] Patient Information 2015 [place], [place]. This information is not intended to replace advice given to you by your health care provider. Make sure you discuss any questions you have with your health care provider.

## 2014-04-18 NOTE — Assessment & Plan Note (Signed)
Pt with likely dermatitis which responded appropriately to triamcinolone ointment Will refill ointment today

## 2014-04-18 NOTE — Assessment & Plan Note (Signed)
Smoking cessation discussed with patient as well as dangers of smoking. He refuses to quit at this time Smoking cessation materials given to patient and he was encouraged to quit.

## 2014-04-18 NOTE — Assessment & Plan Note (Signed)
-   Stable. Needs PFTs but missed last appointment for them - Pt to meet with new PCP and attempt to reschedule PFTs - c/w advair and albuterol

## 2014-04-18 NOTE — Assessment & Plan Note (Signed)
Check lipid profile today May need to consider starting statin therapy

## 2014-04-18 NOTE — Assessment & Plan Note (Signed)
Patient refuses to undergo colonoscopy at this time. Risks and benefits explained to patient but he continues to refuse

## 2014-04-18 NOTE — Assessment & Plan Note (Signed)
BP Readings from Last 3 Encounters:  04/18/14 120/73  09/06/13 137/74  06/07/13 115/71    Lab Results  Component Value Date   NA 142 09/06/2013   K 4.3 09/06/2013   CREATININE 1.40* 09/06/2013    Assessment: Blood pressure control:  well controlled Progress toward BP goal:   at goal Comments: Will change BP meds to include ACE-I given CKD and diabetes  Plan: Medications:  continue with amlodipine. will stop HCTZ and start lisinopril/HCTZ combination Educational resources provided:   Self management tools provided:   Other plans: HCTZ 25 mg dc'd and lisinopril/HCTZ 10/12.5 mg started

## 2014-06-13 ENCOUNTER — Encounter: Payer: Self-pay | Admitting: Adult Health

## 2014-06-13 ENCOUNTER — Ambulatory Visit (INDEPENDENT_AMBULATORY_CARE_PROVIDER_SITE_OTHER): Payer: Medicare Other | Admitting: Adult Health

## 2014-06-13 VITALS — BP 108/64 | HR 66 | Temp 97.9°F | Resp 16 | Ht 72.0 in | Wt 191.5 lb

## 2014-06-13 DIAGNOSIS — M25569 Pain in unspecified knee: Secondary | ICD-10-CM

## 2014-06-13 DIAGNOSIS — M25561 Pain in right knee: Secondary | ICD-10-CM

## 2014-06-13 NOTE — Patient Instructions (Addendum)
  Ace Knee Brace with dual side stabilizers - one size #[id number]  Place pillow in between your knees when you lie on your side to prevent the pressure   You have refills at [facility] for a year  Return in 3 month for follow up with Dr. Nicki Reaper.

## 2014-06-13 NOTE — Progress Notes (Signed)
Patient ID: Zachary Santos, male   DOB: Sep 19, 1936, 78 y.o.   MRN: [id number]   Subjective:    Patient ID: Zachary Santos, male    DOB: Jul 07, 1936, 78 y.o.   MRN: [id number]  HPI  Patient is a pleasant 78 year old male who presents to clinic to establish care in this office since it is closer to his home. Patient has been established within the [facility] system. Overall, patient is doing well. His only concern today is occasional pain of the right knee. He reports that he is participating in cardiac rehabilitation and notices that his knee bothers him during certain exercises. History of osteoarthritis in the right knee. Denies feeling weakness or his leg giving out. Reports some twisting of the knee. He is really not interested in having an MRI at this time. He prefers to have as little as possible done. Denies taking any medication for his knees. He reports that it eventually the discomfort goes away. Also experiences pain when lying on his side and knees touch. He reports the pain from the pressure wakes him.   Past Medical History  Diagnosis Date  . Chronic fatigue   . Stable angina   . COPD (chronic obstructive pulmonary disease)   . Hypertension   . Acute renal disease   . Paget's disease   . Tobacco abuse      Past Surgical History  Procedure Laterality Date  . Spine surgery      cervical spine surgery  . Appendectomy    . Laparscopic chole    . Trigeminal neuralgia      with surgery in [place]     No family history on file.   History   Social History  . Marital Status: Married    Spouse Name: N/A    Number of Children: N/A  . Years of Education: N/A   Occupational History  . Not on file.   Social History Main Topics  . Smoking status: Current Every Day Smoker -- 1.00 packs/day for 60 years    Types: Cigarettes  . Smokeless tobacco: Not on file  . Alcohol Use: Yes  . Drug Use: Not on file  . Sexual Activity: Not on file   Other Topics Concern  . Not on  file   Social History Narrative  . No narrative on file     Current Outpatient Prescriptions on File Prior to Visit  Medication Sig Dispense Refill  . albuterol (PROVENTIL HFA;VENTOLIN HFA) 108 (90 BASE) MCG/ACT inhaler Inhale 2 puffs into the lungs every 6 (six) hours as needed for wheezing.  8.5 g  3  . amLODipine (NORVASC) 10 MG tablet Take 1 tablet (10 mg total) by mouth daily.  90 tablet  3  . aspirin 81 MG EC tablet Take 1 tablet (81 mg total) by mouth daily.  30 tablet  6  . doxazosin (CARDURA) 2 MG tablet TAKE TWO TABLETS BY MOUTH AT BEDTIME  90 tablet  3  . Fluticasone-Salmeterol (ADVAIR) 250-50 MCG/DOSE AEPB Inhale 1 puff into the lungs every 12 (twelve) hours.  14 each  2  . lisinopril-hydrochlorothiazide (PRINZIDE) 10-12.5 MG per tablet Take 1 tablet by mouth daily.  30 tablet  11  . triamcinolone (KENALOG) 0.025 % ointment Apply topically 4 (four) times daily as needed.  30 g  3  . glipiZIDE (GLUCOTROL XL) 2.5 MG 24 hr tablet Take 1 tablet (2.5 mg total) by mouth daily with breakfast.  30 tablet  1   No  current facility-administered medications on file prior to visit.     Review of Systems  Constitutional: Negative.   HENT: Negative.   Eyes: Negative.   Respiratory: Negative.   Cardiovascular: Negative.   Gastrointestinal: Negative.   Endocrine: Negative.   Genitourinary: Negative.   Musculoskeletal:       Right knee pain  Skin: Negative.   Allergic/Immunologic: Negative.   Neurological: Negative.   Hematological: Negative.   Psychiatric/Behavioral: Negative.        Objective:  BP 108/64  Pulse 66  Temp(Src) 97.9 F (36.6 C) (Oral)  Resp 16  Ht 6' (1.829 m)  Wt 191 lb 8 oz (86.864 kg)  BMI 25.97 kg/m2  SpO2 95%   Physical Exam  Constitutional: He is oriented to person, place, and time. He appears well-developed and well-nourished. No distress.  HENT:  Head: Normocephalic and atraumatic.  Eyes: Conjunctivae and EOM are normal.  Cardiovascular:  Normal rate, regular rhythm, normal heart sounds and intact distal pulses.  Exam reveals no gallop and no friction rub.   No murmur heard. Pulmonary/Chest: Effort normal and breath sounds normal. No respiratory distress. He has no wheezes. He has no rales.  Musculoskeletal: Normal range of motion. He exhibits no edema and no tenderness.  Crepitus of knee with extension and flexion  Neurological: He is alert and oriented to person, place, and time.  Skin: Skin is warm and dry.  Psychiatric: He has a normal mood and affect. His behavior is normal. Judgment and thought content normal.      Assessment & Plan:   1. Right knee pain Discussed wearing a knee brace during exercising to see if help with stability and his pain. As mentioned in HPI, he is not interested in an MRI (pt had reported twisting of knee). He reports that pain is not debilitating. For the pressure when he lies on his side, recommend that he use a pillow between his knees. If the brace does not alleviate symptoms then he will consider further testing. Does not like to take medication but he can try OTC ibuprofen or tylenol for pain if necessary.

## 2014-06-13 NOTE — Progress Notes (Signed)
Pre visit review using our clinic review tool, if applicable. No additional management support is needed unless otherwise documented below in the visit note. 

## 2014-08-05 ENCOUNTER — Observation Stay: Payer: Self-pay | Admitting: Internal Medicine

## 2014-08-05 LAB — URINALYSIS, COMPLETE
Bacteria: NONE SEEN
Bilirubin,UR: NEGATIVE
Blood: NEGATIVE
Glucose,UR: NEGATIVE mg/dL (ref 0–75)
Hyaline Cast: 6
Ketone: NEGATIVE
Leukocyte Esterase: NEGATIVE
Nitrite: NEGATIVE
Ph: 5 (ref 4.5–8.0)
Protein: NEGATIVE
RBC,UR: 1 /HPF (ref 0–5)
Specific Gravity: 1.012 (ref 1.003–1.030)
Squamous Epithelial: 1
WBC UR: 3 /HPF (ref 0–5)

## 2014-08-05 LAB — CBC WITH DIFFERENTIAL/PLATELET
Basophil #: 0 10*3/uL (ref 0.0–0.1)
Basophil %: 0.4 %
Eosinophil #: 0.1 10*3/uL (ref 0.0–0.7)
Eosinophil %: 1.4 %
HCT: 41.9 % (ref 40.0–52.0)
HGB: 14.1 g/dL (ref 13.0–18.0)
Lymphocyte #: 1.5 10*3/uL (ref 1.0–3.6)
Lymphocyte %: 15.3 %
MCH: 30.9 pg (ref 26.0–34.0)
MCHC: 33.6 g/dL (ref 32.0–36.0)
MCV: 92 fL (ref 80–100)
Monocyte #: 0.7 x10 3/mm (ref 0.2–1.0)
Monocyte %: 7.5 %
Neutrophil #: 7.5 10*3/uL — ABNORMAL HIGH (ref 1.4–6.5)
Neutrophil %: 75.4 %
Platelet: 154 10*3/uL (ref 150–440)
RBC: 4.56 10*6/uL (ref 4.40–5.90)
RDW: 13.6 % (ref 11.5–14.5)
WBC: 9.9 10*3/uL (ref 3.8–10.6)

## 2014-08-05 LAB — COMPREHENSIVE METABOLIC PANEL
Albumin: 3.6 g/dL (ref 3.4–5.0)
Alkaline Phosphatase: 237 U/L — ABNORMAL HIGH
Anion Gap: 11 (ref 7–16)
BUN: 19 mg/dL — ABNORMAL HIGH (ref 7–18)
Bilirubin,Total: 0.3 mg/dL (ref 0.2–1.0)
Calcium, Total: 8.1 mg/dL — ABNORMAL LOW (ref 8.5–10.1)
Chloride: 105 mmol/L (ref 98–107)
Co2: 23 mmol/L (ref 21–32)
Creatinine: 1.6 mg/dL — ABNORMAL HIGH (ref 0.60–1.30)
EGFR (African American): 54 — ABNORMAL LOW
EGFR (Non-African Amer.): 45 — ABNORMAL LOW
Glucose: 174 mg/dL — ABNORMAL HIGH (ref 65–99)
Osmolality: 284 (ref 275–301)
Potassium: 3.6 mmol/L (ref 3.5–5.1)
SGOT(AST): 26 U/L (ref 15–37)
SGPT (ALT): 31 U/L
Sodium: 139 mmol/L (ref 136–145)
Total Protein: 6.8 g/dL (ref 6.4–8.2)

## 2014-08-05 LAB — TROPONIN I: Troponin-I: <0.02 ng/mL

## 2014-08-06 DIAGNOSIS — R55 Syncope and collapse: Secondary | ICD-10-CM

## 2014-08-06 LAB — COMPREHENSIVE METABOLIC PANEL
Albumin: 3.3 g/dL — ABNORMAL LOW (ref 3.4–5.0)
Alkaline Phosphatase: 222 U/L — ABNORMAL HIGH
Anion Gap: 8 (ref 7–16)
BUN: 19 mg/dL — ABNORMAL HIGH (ref 7–18)
Bilirubin,Total: 0.4 mg/dL (ref 0.2–1.0)
Calcium, Total: 8.3 mg/dL — ABNORMAL LOW (ref 8.5–10.1)
Chloride: 106 mmol/L (ref 98–107)
Co2: 26 mmol/L (ref 21–32)
Creatinine: 1.39 mg/dL — ABNORMAL HIGH (ref 0.60–1.30)
EGFR (African American): >60
EGFR (Non-African Amer.): 53 — ABNORMAL LOW
Glucose: 117 mg/dL — ABNORMAL HIGH (ref 65–99)
Osmolality: 283 (ref 275–301)
Potassium: 3.9 mmol/L (ref 3.5–5.1)
SGOT(AST): 22 U/L (ref 15–37)
SGPT (ALT): 28 U/L
Sodium: 140 mmol/L (ref 136–145)
Total Protein: 6.2 g/dL — ABNORMAL LOW (ref 6.4–8.2)

## 2014-08-06 LAB — CK-MB
CK-MB: 1.6 ng/mL (ref 0.5–3.6)
CK-MB: 2 ng/mL (ref 0.5–3.6)
CK-MB: 2 ng/mL (ref 0.5–3.6)

## 2014-08-06 LAB — CBC WITH DIFFERENTIAL/PLATELET
Basophil #: 0 10*3/uL (ref 0.0–0.1)
Basophil %: 0.4 %
Eosinophil #: 0.2 10*3/uL (ref 0.0–0.7)
Eosinophil %: 2.2 %
HCT: 38.9 % — ABNORMAL LOW (ref 40.0–52.0)
HGB: 13.2 g/dL (ref 13.0–18.0)
Lymphocyte #: 1.8 10*3/uL (ref 1.0–3.6)
Lymphocyte %: 22.9 %
MCH: 30.7 pg (ref 26.0–34.0)
MCHC: 33.9 g/dL (ref 32.0–36.0)
MCV: 91 fL (ref 80–100)
Monocyte #: 0.7 x10 3/mm (ref 0.2–1.0)
Monocyte %: 9.1 %
Neutrophil #: 5.2 10*3/uL (ref 1.4–6.5)
Neutrophil %: 65.4 %
Platelet: 160 10*3/uL (ref 150–440)
RBC: 4.28 10*6/uL — ABNORMAL LOW (ref 4.40–5.90)
RDW: 13.6 % (ref 11.5–14.5)
WBC: 7.9 10*3/uL (ref 3.8–10.6)

## 2014-08-06 LAB — TROPONIN I
Troponin-I: <0.02 ng/mL
Troponin-I: <0.02 ng/mL

## 2014-08-06 LAB — HEMOGLOBIN A1C: Hemoglobin A1C: 7.6 % — ABNORMAL HIGH (ref 4.2–6.3)

## 2014-08-06 LAB — TSH: Thyroid Stimulating Horm: 1.44 u[IU]/mL

## 2014-09-13 ENCOUNTER — Ambulatory Visit: Payer: Medicare Other | Admitting: Internal Medicine

## 2014-09-23 ENCOUNTER — Ambulatory Visit: Payer: Medicare Other | Admitting: Nurse Practitioner

## 2014-09-23 ENCOUNTER — Ambulatory Visit: Payer: Medicare Other | Admitting: Internal Medicine

## 2014-10-01 ENCOUNTER — Ambulatory Visit: Payer: Medicare Other | Admitting: Internal Medicine

## 2014-10-07 ENCOUNTER — Encounter: Payer: Self-pay | Admitting: Nurse Practitioner

## 2014-10-07 ENCOUNTER — Ambulatory Visit (INDEPENDENT_AMBULATORY_CARE_PROVIDER_SITE_OTHER): Payer: Medicare Other | Admitting: Nurse Practitioner

## 2014-10-07 VITALS — BP 115/58 | HR 59 | Temp 97.8°F | Resp 16 | Ht 72.0 in | Wt 194.1 lb

## 2014-10-07 DIAGNOSIS — F17200 Nicotine dependence, unspecified, uncomplicated: Secondary | ICD-10-CM

## 2014-10-07 DIAGNOSIS — I10 Essential (primary) hypertension: Secondary | ICD-10-CM

## 2014-10-07 DIAGNOSIS — Z720 Tobacco use: Secondary | ICD-10-CM

## 2014-10-07 DIAGNOSIS — E119 Type 2 diabetes mellitus without complications: Secondary | ICD-10-CM

## 2014-10-07 NOTE — Assessment & Plan Note (Signed)
Given machine to monitor blood sugars at home. One Touch Mini. Fu 3 months

## 2014-10-07 NOTE — Progress Notes (Signed)
Pre visit review using our clinic review tool, if applicable. No additional management support is needed unless otherwise documented below in the visit note. 

## 2014-10-07 NOTE — Assessment & Plan Note (Signed)
Still refusing to quit smoking.

## 2014-10-07 NOTE — Progress Notes (Signed)
Subjective:    Patient ID: Zachary Santos, male    DOB: [birth year], 78 y.o.   MRN: [id number]  HPI Zachary Santos is a 78 yo male here for 3 month follow up.   1) Right knee- doing better he reports, wears brace  2) DM type II - Needs machine - One Touch Ultra Mini. Not checking BS at home currently.  3) HTN- Doing well at home with BP. Discussed taking Doxazosin at night instead of with other HTN meds during the morning.   Eye exam- due per pt 4) Tobacco Sensation- Refuses to stop 5) Colonoscopy- Refuses    Review of Systems  Constitutional: Negative for fever, chills, diaphoresis and unexpected weight change.  HENT: Negative for trouble swallowing.   Eyes: Negative for visual disturbance.  Respiratory: Negative for shortness of breath and wheezing.   Cardiovascular: Negative for chest pain, palpitations and leg swelling.  Gastrointestinal: Negative for nausea, vomiting, diarrhea, constipation and blood in stool.  Genitourinary: Positive for decreased urine volume.  Musculoskeletal: Positive for myalgias and arthralgias. Negative for joint swelling.  Skin:       Facial skin changes  Neurological: Negative for dizziness, syncope, weakness and headaches.  Psychiatric/Behavioral: Negative for suicidal ideas. The patient is not nervous/anxious.    Past Medical History  Diagnosis Date  . Chronic fatigue   . Stable angina   . COPD (chronic obstructive pulmonary disease)   . Hypertension   . Acute renal disease   . Paget's disease   . Tobacco abuse     History   Social History  . Marital Status: Married    Spouse Name: N/A    Number of Children: N/A  . Years of Education: N/A   Occupational History  . Not on file.   Social History Main Topics  . Smoking status: Current Every Day Smoker -- 1.00 packs/day for 60 years    Types: Cigarettes  . Smokeless tobacco: Not on file  . Alcohol Use: Yes  . Drug Use: Not on file  . Sexual Activity: Not on file   Other Topics Concern   . Not on file   Social History Narrative    Past Surgical History  Procedure Laterality Date  . Spine surgery      cervical spine surgery  . Appendectomy    . Laparscopic chole    . Trigeminal neuralgia      with surgery in [place]    No family history on file.  No Known Allergies  Current Outpatient Prescriptions on File Prior to Visit  Medication Sig Dispense Refill  . albuterol (PROVENTIL HFA;VENTOLIN HFA) 108 (90 BASE) MCG/ACT inhaler Inhale 2 puffs into the lungs every 6 (six) hours as needed for wheezing. 8.5 g 3  . amLODipine (NORVASC) 10 MG tablet Take 1 tablet (10 mg total) by mouth daily. 90 tablet 3  . aspirin 81 MG EC tablet Take 1 tablet (81 mg total) by mouth daily. 30 tablet 6  . doxazosin (CARDURA) 2 MG tablet TAKE TWO TABLETS BY MOUTH AT BEDTIME 90 tablet 3  . Fluticasone-Salmeterol (ADVAIR) 250-50 MCG/DOSE AEPB Inhale 1 puff into the lungs every 12 (twelve) hours. 14 each 2  . glipiZIDE (GLUCOTROL XL) 2.5 MG 24 hr tablet Take 1 tablet (2.5 mg total) by mouth daily with breakfast. 30 tablet 1  . lisinopril-hydrochlorothiazide (PRINZIDE) 10-12.5 MG per tablet Take 1 tablet by mouth daily. 30 tablet 11  . triamcinolone (KENALOG) 0.025 % ointment Apply topically 4 (four)  times daily as needed. 30 g 3   No current facility-administered medications on file prior to visit.       Objective:   Physical Exam  Constitutional: He is oriented to person, place, and time. He appears well-developed and well-nourished. No distress.  HENT:  Head: Normocephalic and atraumatic.  Eyes: Right eye exhibits no discharge. Left eye exhibits no discharge. No scleral icterus.  Neck: Normal range of motion.  Cardiovascular: Normal rate and regular rhythm.   Pulmonary/Chest: Effort normal and breath sounds normal.  Neurological: He is alert and oriented to person, place, and time.  Skin: Skin is warm. He is not diaphoretic. No cyanosis. Nails show clubbing.  Psychiatric: He has  a normal mood and affect. His behavior is normal. Judgment and thought content normal.   BP 115/58 mmHg  Pulse 59  Temp(Src) 97.8 F (36.6 C) (Oral)  Resp 16  Ht 6' (1.829 m)  Wt 194 lb 1.9 oz (88.052 kg)  BMI 26.32 kg/m2  SpO2 97%     Assessment & Plan:

## 2014-10-07 NOTE — Assessment & Plan Note (Addendum)
Stable on current BP meds. Pt taking all at once. Since BP and heart rate are slightly low discussed taking Doxazosin at night time not with the rest of the meds. FU 3 more months. Need repeat labs.   BP Readings from Last 3 Encounters:  10/07/14 115/58  06/13/14 108/64  04/18/14 120/73

## 2014-10-07 NOTE — Patient Instructions (Signed)
Follow up in 3 more months.

## 2014-11-18 ENCOUNTER — Other Ambulatory Visit: Payer: Self-pay | Admitting: Internal Medicine

## 2014-11-25 ENCOUNTER — Other Ambulatory Visit: Payer: Self-pay

## 2014-11-25 ENCOUNTER — Other Ambulatory Visit: Payer: Self-pay | Admitting: Internal Medicine

## 2014-11-25 DIAGNOSIS — E119 Type 2 diabetes mellitus without complications: Secondary | ICD-10-CM

## 2014-11-25 MED ORDER — GLIPIZIDE ER 2.5 MG PO TB24: 2.5000 mg | ORAL_TABLET | Freq: Every day | ORAL | Status: DC

## 2015-01-06 ENCOUNTER — Ambulatory Visit: Payer: Self-pay | Admitting: Nurse Practitioner

## 2015-02-07 NOTE — H&P (Signed)
PATIENT NAME:  WAGNER, TANZI MR#:  [id number] DATE OF BIRTH:  11-Mar-1936  DATE OF ADMISSION:  02/15/2013  PRIMARY CARE PHYSICIAN: Dr.  ED REFERRING:  Dr. Conni Slipper    CHIEF COMPLAINT: Shortness of breath, cough.   HISTORY OF PRESENT ILLNESS: The patient is a 79 year old white male with history of lifelong smoking, has history of hypertension. There is no diagnosis of chronic obstructive pulmonary disease, who presents with complaint of having shortness of breath with coughing since Monday. The patient reports that normally he is not short of breath. He does occasionally have wheezing. However, since Monday, he has been having clearish sputum. Every time he coughs, he gets into coughing spell and he gets very short of breath. The patient ambulated in the ED after receiving breathing treatment and he was very short of breath. Therefore, we were asked to admit the patient. He otherwise denies any fevers, chills. He reports that he has never been hospitalized for breathing difficulties. He otherwise denies any nausea, vomiting, diarrhea, or abdominal pain.   PAST MEDICAL HISTORY: Significant for hypertension.   PAST SURGICAL HISTORY:  1.  Status post neck surgery.  2.  Surgery for a trigeminal neuro lesion.   ALLERGIES: None.   CURRENT MEDICATIONS AT HOME:  He is on HCTZ 25 p.o. daily, doxazosin 4 mg, aspirin 1 mg 1 tab p.o. daily, amlodipine 10 daily.   SOCIAL HISTORY: He smokes 1 pack per day for 60 years. No alcohol or drug use.   FAMILY HISTORY: Positive for hypertension.   REVIEW OF SYSTEMS:   CONSTITUTIONAL: Denies any fevers, fatigue, weakness. No pain. No weight loss. No weight gain.  EYES: No blurred or double vision. No pain. No redness or inflammation. No glaucoma. No cataracts.  ENT: No tinnitus. No ear pain. No hearing loss. No seasonal or year-round allergies. No difficulty swallowing.  RESPIRATORY: Complains of coughing and wheezing. Denies any hemoptysis. Has occasional  dyspnea. No diagnosis of chronic obstructive pulmonary disease, no pneumonia.  CARDIOVASCULAR: No chest pain. No orthopnea. No edema. No arrhythmia. No palpitations. No syncope.  GASTROINTESTINAL: No nausea, vomiting, diarrhea. No abdominal pain. No hematemesis. No melena.  GENITOURINARY: Denies any dysuria, frequency, urgency or hesitancy.  ENDOCRINE: Denies any polyuria, nocturia or thyroid problems.  HEMATOLOGIC/LYMPHATIC:  Denies anemia, easy bruisability or bleeding.  SKIN: No acne. No rash. No changes in mole, hair or skin.  MUSCULOSKELETAL: Denies any pain in the neck, back or shoulder.  NEUROLOGIC: No numbness. No CVA. No transient ischemic attack. No seizures.  PSYCHIATRIC: No anxiety. No insomnia. No ADD.   PHYSICAL EXAMINATION: VITAL SIGNS: Temperature 97.6, pulse 75, respirations 18, blood pressure 131/65, O2 93%.  GENERAL: The patient is a well-developed, well-nourished male in no acute distress.  HEENT: Head atraumatic, normocephalic. Pupils equally round, reactive to light and accommodation. There is no conjunctival pallor. No scleral icterus. Nasal exam shows no drainage or ulceration. Oropharynx is clear without any exudate.  EARS: no external lesions, or drainage NECK: No thyromegaly. No carotid bruits.  LUNGS:  Diminished breath sounds bilaterally without any rales, rhonchi or wheezing. No accessory muscle usage.  CARDIOVASCULAR: Regular rate and rhythm. No murmurs, rubs, clicks or gallops. PMI is not displaced.   ABDOMEN: Soft, nontender, nondistended. Positive bowel sounds x 4.  EXTREMITIES: No clubbing, cyanosis or edema Muskuloskeletal: no erythema, from intact.  SKIN: No rash.  LYMPHATICS: No lymph nodes palpable.  VASCULAR: Good DP, PT pulses.  PSYCHIATRIC: Not anxious or depressed.  NEUROLOGIC: Awake, cranial  nerves II through XII grossly intact. No focal deficits.   GU deffered  LABORATORY, DIAGNOSTIC AND RADIOLOGIC DATA:  In the ED: Glucose 114, BNP 788, BUN  11, creatinine 1.26, sodium 136, potassium 3.8, chloride 102, CO2 is 29, calcium 9.0, total protein 7.1, albumin 3.7, bilirubin total 0.6, alkaline phosphatase 238, AST 24. Troponin less than 0.02. WBC 7.8, hemoglobin 13.8, platelet count 154.   ASSESSMENT AND PLAN: The patient is a 79 year old white male with history of smoking, presents with shortness of breath, cough and there is no history of chronic obstructive pulmonary disease.  1.  Shortness of breath, cough likely due to acute bronchitis and possible chronic obstructive pulmonary disease exacerbation, which would be a new diagnosis for him. The patient is currently and is not very hypoxic. We will place him on observation overnight, treat him with nebulizers, steroids and antibiotics. Likely could be switched over to oral antibiotics and prednisone taper tomorrow.  2.  Elevated BNP. Chest x-ray negative. There is no evidence of congestive heart failure.  3.  Hypertension. We will continue amlodipine, HCTZ.  4.  Nicotine addiction. The patient counseled regarding smoking cessation. Nicotine patch offered. He does not wish to pursue smoking cessation. Please note 4 minutes spent on counseling nicotine addiction.    TIME SPENT: 50 minutes spent on H and P.    ____________________________ Lafonda Mosses. Posey Pronto, MD shp:cc D: 02/15/2013 18:09:20 ET T: 02/15/2013 19:07:31 ET JOB#: [id number]  cc: Caroleann Casler H. Posey Pronto, MD, <Dictator> Alric Seton MD ELECTRONICALLY SIGNED 02/24/2013 14:20

## 2015-02-07 NOTE — Discharge Summary (Signed)
PATIENT NAME:  Zachary Santos, Zachary Santos MR#:  [id number] DATE OF BIRTH:  September 27, 1936  DATE OF ADMISSION:  02/16/2013 DATE OF DISCHARGE:  02/17/2013  DISCHARGE DIAGNOSES: 1.  Acute respiratory failure due to chronic obstructive pulmonary disease exacerbation. 2.  Hypertension.  3.  Acute renal insufficiency.  4.  Smoker.   CONDITION ON DISCHARGE: Stable.   CODE STATUS ON DISCHARGE: Full code.   MEDICATIONS ON DISCHARGE: Amlodipine 10 mg oral tablet once a day, doxazosin 4 mg oral tablet once a day, aspirin 81 mg once a day, prednisone 10 mg start with 60 mg daily taper 10 mg daily until complete, alprazolam 0.25 mg oral tablet every 8 hours as needed for anxiety, Spiriva 18 mcg inhalation capsule once a day, ProAir 2 puffs inhaled 4 times a day as needed for shortness of breath, Advair Diskus 1 puff inhaled 2 times a day, Levaquin 250 mg oral tablet every 24 hours for 3 days.   HOME HEALTH ON DISCHARGE: No.   HOME OXYGEN ON DISCHARGE: No.   DIET ON DISCHARGE: Low sodium.   DIET CONSISTENCY: Regular.   TIME FRAME TO FOLLOW-UP: Within 1 to 2 weeks and  advised to follow kidney function and pulmonary function tests as outpatient with primary care physician.   HISTORY OF PRESENT ILLNESS: A 79 year old white male with history of lifelong smoking, hypertension, no diagnosis of chronic obstructive pulmonary disease, presented with complaint of shortness of breath with cough. He also had occasional wheezing for the last few days. In the ER whenever he had coughing spells, he was getting very short of breath and so he was admitted with this complaint. Otherwise,  he denied any fever, chills.   HOSPITAL COURSE AND STAY: The patient was started on treatment of chronic obstructive pulmonary disease exacerbation.  The next day, he was feeling a little better and so he tried to take off the oxygen, and he walked outside the floor in the general medical ER in the hospital and he had an episode of acute shortness of  breath on returning with activation of rapid response. Respiratory rate was 32 and he was requiring 4 to 5 liters oxygen supplementation. He was started on nonrebreather mask and immediate IV steroids and respiratory treatment were given. He had a slow recovery within the next few minutes from this episode. The next day, which was the third hospital day, the patient was feeling very comfortable and not feeling any shortness of breath on walking without oxygen and so we discharged him home with advice to follow with primary care to get pulmonary function tests for chronic obstructive pulmonary disease.   OTHER MEDICAL ISSUES IN THIS HOSPITAL STAY:  1.  Creatinine level was elevated on admission, which slightly came down, but was still high, and so on discharge we advised him to follow with primary care to get his kidney function checked within 1 to 2 weeks.  2.  Hypertension. We continued amlodipine and held hydrochlorothiazide due to renal failure and blood pressure remained under control.  3.  Smoking. Smoking cessation counseling was done by admitting physician. The patient refused to use any nicotine supplementation in hospital.   LABORATORY, DIAGNOSTIC AND RADIOLOGIC DATA:  On presentation, BNP was 788, BUN was 11, creatinine was 1.26, which went up to 1.49 the next day and then came down to 1.42. Total WBC count 7.8, hemoglobin 13.6.   ABG at the time of rapid response:  pH was 7.43, pCO2 was 34 and pO2 was 75 on 36%  FiO2 by nasal cannula.   Chest x-ray, portable: On admission no acute cardiopulmonary disease and at the time,  rapid response. There is no evidence of pneumonia, no congestive heart failure or other cardiopulmonary abnormality.   TOTAL TIME SPENT ON THIS DISCHARGE: 45 minutes   ____________________________ Ceasar Lund. Anselm Jungling, MD vgv:cc D: 02/18/2013 15:04:39 ET T: 02/18/2013 16:27:49 ET JOB#: [id number]  cc: Ceasar Lund. Anselm Jungling, MD, <Dictator> Modesto Charon,  MD Vaughan Basta MD ELECTRONICALLY SIGNED 03/01/2013 6:14

## 2015-02-08 NOTE — H&P (Signed)
PATIENT NAME:  Zachary Santos, THACKSTON MR#:  [id number] DATE OF BIRTH:  [birth year]  DATE OF ADMISSION:  08/06/2014  REFERRING PHYSICIAN:  Lennette Bihari A. Paduchowski, MD  PRIMARY CARE PHYSICIAN:  Previously [place] at [facility], but the patient is scheduled to see Dr. Lavone Nian shortly, a new PCP.   ADMITTING PHYSICIAN:  Juluis Mire, MD  CHIEF COMPLAINT:  Passed out.   HISTORY OF PRESENT ILLNESS:  A 79 year old Caucasian male with a past medical history significant for hypertension, borderline diabetes mellitus, benign prostatic hypertrophy, and tobacco abuse, brought by EMS to the Emergency Room with the complaints of passed out. According to the patient and the patient's wife, the patient had been sitting in front of the computer the whole afternoon, and the patient's wife heard a thumping sound from her husband's room, upon which she went to the room and found her husband unresponsive with his head lying on the computer table. She tried to wake up her husband, but he was unresponsive for a minute or 2. Later, he regained consciousness. The patient said he does not remember the episode at all. He denies having any chest pain, shortness of breath, dizziness, palpitations, focal weakness or numbness, or diaphoresis prior to the event. After the event, the patient felt mildly nauseous, but did not have any vomiting. No abdominal pain. The patient was also noted to have incontinence of bowel with loose stool during the event. No history of any recent fever, cough, chest pain, or exertional dyspnea. No dysuria. No similar episodes in the past.    PAST MEDICAL HISTORY: 1.  Hypertension.  2.  Borderline diabetes mellitus. He was asked to take medications, but he did not start any medications.   3.  Benign prostatic hypertrophy.  4.  Tobacco usage.    PAST SURGICAL HISTORY:  1.  C-spine surgery, ACDF.  2.  Status post cholecystectomy.  3.  Laser surgery for trigeminal neuralgia.    HOME  MEDICATIONS: 1.  Lisinopril/hydrochlorothiazide 10/12.5 mg 1 tablet a day.  2.  Amlodipine 10 mg once a day.  3.  Aspirin 81 mg once a day orally.  4.  Doxazosin 4 mg tablet once a day.   ALLERGIES:  No known drug allergies.   SOCIAL HISTORY:  He is married and lives with his wife. He is a retired Administrator. History of chronic smoking, 2 packs per day for the past many years. Very infrequent EtOH use once in a blue moon and denies any substance abuse.   FAMILY HISTORY:  Significant for mother with cancer and diabetes mellitus. Brother with diabetes and heart problem.   REVIEW OF SYSTEMS: CONSTITUTIONAL:  Negative for fever. No abnormal weight gain or weight loss lately. After the episode, the patient felt generalized weakness.   EYES:  Negative for blurred or double vision. No pain. No redness. No inflammation.  EARS, NOSE, AND THROAT:  Negative for tinnitus, ear pain, hearing loss, epistaxis, nasal discharge, or difficulty swallowing.   RESPIRATORY:  Negative for cough, wheezing, hemoptysis, dyspnea, or painful respiration. CARDIOVASCULAR:  Negative for chest pain, orthopnea, dyspnea on exertion, pedal edema, or palpitations. As mentioned in the HPI, he had a syncopal episode, which was witnessed by his wife last night.   GASTROINTESTINAL:  Negative for nausea, vomiting, diarrhea, abdominal pain, hematemesis, GERD symptoms, melena, or rectal bleeding.  GENITOURINARY:  Negative for dysuria, hematuria, frequency, or urgency. History of BPH, for which he takes doxazosin.  ENDOCRINE:  Negative for polyuria or polydipsia. No  heat or cold intolerance.  HEMATOLOGIC/LYMPHATIC:  Negative for anemia or easy bruising or bleeding.  INTEGUMENTARY:  Negative for acne, rash, or skin lesions.  MUSCULOSKELETAL:  Negative for any arthritis, joint swelling, gout, or pain.  NEUROLOGICAL:  Negative for focal weakness or numbness. No history of any CVA, TIA, or seizure activity in the past.   PSYCHIATRIC:   Negative for anxiety, insomnia, depression, or bipolar disorder.  PHYSICAL EXAMINATION: VITAL SIGNS:  Temperature 97.4, pulse rate 64 per minute and regular, respirations 18 per minute, blood pressure 107/56, oxygen saturation 98% on room air.  GENERAL:  Well-developed, well-nourished, well-built male, pleasant and cooperative, alert, and in no acute distress.  HEAD:  Atraumatic, normocephalic.  EYES:  Pupils are equal and reactive to light and accommodation. No conjunctival pallor. No scleral icterus. Extraocular movements are normal.  NOSE:  No nasal lesions. No drainage.  EARS:  No drainage. No external lesions.  MOUTH:  No mucosal lesions. No exudates. No masses.  NECK:  Supple. No JVD. No thyromegaly. No carotid bruit. Range of motion of neck without any pain.  RESPIRATORY:  Good respiratory effort. Not using accessory muscles of respiration. Bilateral vesicular breath sounds present.   CARDIOVASCULAR:  S1, S2 regular. No murmurs, gallops, or clicks appreciated. Pulses are equal at carotid, femoral and pedal pulses. No pedal edema.  GASTROINTESTINAL:  Abdomen is soft and nontender. No hepatosplenomegaly. Bowel sounds present and equal in all 4 quadrants. No rebound. No guarding. No rigidity.  GENITOURINARY:  Deferred.  MUSCULOSKELETAL:  Range of motion is adequate and normal in all areas. Strength and tone are equal bilaterally in upper and lower extremities.   SKIN:  Inspection within normal limits.  LYMPH:  No cervical lymphadenopathy.  VASCULAR:  Good dorsalis pedis and posterior tibial pulses.  NEUROLOGICAL:  Alert, awake, and oriented x 3. Cranial nerves II through XII are intact. DTRs are 2+ bilaterally and symmetrical in all extremities. Motor strength is 5/5 in both upper and lower extremities.  PSYCHIATRIC:  Judgment and insight are adequate. Alert and oriented x 3. Memory and mood are within normal limits.   LABORATORY DATA:  Blood sugar 134, BUN 19, creatinine 1.60, sodium 139,  potassium 3.6, chloride 105, bicarbonate 23, estimated GFR 45, total calcium 8.1. Hemoglobin A1C 7.6. Total protein 6.8, albumin 2.6, total bilirubin 0.3, alkaline phosphatase 237, AST 26, ALT 31. Troponin less than 0.02. WBC 9.9, hemoglobin 14.1, hematocrit 41.9, platelet count 154. Urinalysis is clear, negative for bacteria.   EKG:  Normal sinus rhythm with ventricular rate of 75 beats per minute. Another EKG was done in the Emergency Room, which showed sinus bradycardia with 56 beats per minute. No acute ST-T changes.    IMAGING STUDIES:  Chest x-ray:  No acute cardiopulmonary abnormality.   ASSESSMENT AND PLAN:  A 78 year old Caucasian male with a past medical history significant for hypertension, borderline diabetes mellitus, history of benign prostatic hypertrophy, and tobacco abuse, who presents to the Emergency Room with episodes of passing out.    1.  Syncopal episode, witnessed by his wife. No associated chest pain, shortness of breath, or diaphoresis. Workup in the Emergency Room revealed normal EKG, negative troponins, elevated serum creatinine, and otherwise normal labs. Differential diagnosis of the syncopal episode:  Rule out transient ischemic attack, rule out acute coronary syndrome, possible dehydration, ? seizure episode, ? possible vasovagal reaction. Plan:  Admit to telemetry. Serial cardiac enzymes. Neurologic watch. We will obtain CT of the head to rule out any acute central  nervous system event. Aspirin and subcutaneous heparin for deep vein thrombosis prophylaxis. Orthostatic vitals. We will order echocardiogram, carotid Doppler, and cardiology consultation.  2.  Hypertension on lisinopril/hydrochlorothiazide and amlodipine. Blood pressure is controlled. We will change the lisinopril/hydrochlorothiazide to lisinopril 10 mg daily because of elevated creatinine. Follow up blood pressure measurements.  3.  Chronic kidney disease stage III. Previous creatinine in 2004 was 1.4, and now  creatinine is 1.69. Probable dehydration versus  chronic kidney disease. Plan:  We will hold off lisinopril/hydrochlorothiazide. Gentle IV hydration. Follow up BMP.  4.  Elevated blood sugar, history of borderline diabetes mellitus type 2, not on any medications. A1C is elevated. We will put him on sliding scale insulin. Lifestyle modification with antidiabetic diet and exercises and consider medications.  5.  Tobacco usage for a long time. Counseled about quitting smoking. Offered nicotine replacement therapy. The patient is not decided. Counseling done for more than 3 minutes.  6.  History of benign prostatic hypertrophy on doxazosin. Symptoms controlled. Continue same.  7.  Deep vein thrombosis prophylaxis with subcutaneous heparin.  8.  Gastrointestinal prophylaxis with Protonix.   CODE STATUS:  Full code.   TIME SPENT:  55 minutes.    ____________________________ Juluis Mire, MD enr:nb D: 08/06/2014 00:32:10 ET T: 08/06/2014 00:59:40 ET JOB#: [id number]  cc: Juluis Mire, MD, <Dictator> Elon Alas. Nicki Reaper, MD   Juluis Mire MD ELECTRONICALLY SIGNED 08/15/2014 19:17

## 2015-02-08 NOTE — Consult Note (Signed)
Referring Physician:  Azucena Freed :   Primary Care Physician:  Azucena Freed : [facility], [address] [place], [place] [address], [place] [phone]  Reason for Consult: Admit Date: 06-Aug-2014  Chief Complaint: syncope  Reason for Consult: seizure   History of Present Illness: History of Present Illness:   79 yo RHD M presents to [facility] after wife found him passed out at his computer.  Pt does not remember episode and does not have any prior symptoms.  He was slightly confused after this episode and did have bowel incontinence.  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  Pt states that he wants to go home today and will leave AMA if necessary.  ROS:  General denies complaints   HEENT no complaints   Lungs no complaints   Cardiac no complaints   GI no complaints   GU no complaints   Musculoskeletal no complaints   Extremities no complaints   Skin no complaints   Neuro seizure   Endocrine no complaints   Psych no complaints   Past Medical/Surgical Hx:  HTN:   Neck Surgery:   Past Medical/ Surgical Hx:  Past Medical History HTN   Past Surgical History neck sx   Home Medications: Medication Instructions Last Modified Date/Time  hydrochlorothiazide-lisinopril 12.5 mg-10 mg oral tablet 1 tab(s) orally once a day 20-Oct-15 01:17  amLODIPine 10 mg oral tablet 1 tab(s) orally once a day 20-Oct-15 01:17  doxazosin 4 mg oral tablet 1 tab(s) orally once a day 20-Oct-15 01:17  aspirin 81 mg oral tablet 1 tab(s) orally once a day 20-Oct-15 01:17   Allergies:  No Known Allergies:   Allergies:  Allergies NKDA   Social/Family History: Employment Status: retired  Lives With: spouse  Living Arrangements: house  Social History: no tob, no EtOH, no illicits  Family History: no seizures, no stroke   Vital Signs: **Vital Signs.:   20-Oct-15 11:58  Vital Signs Type Routine  Temperature Temperature (F)  98.1  Celsius 36.7  Temperature Source oral  Pulse Pulse 76  Respirations Respirations 18  Systolic BP Systolic BP 754  Diastolic BP (mmHg) Diastolic BP (mmHg) 67  Mean BP 89  Pulse Ox % Pulse Ox % 93  Pulse Ox Activity Level  At rest  Oxygen Delivery Room Air/ 21 %   Physical Exam: General: thin, NAD  HEENT: normocephalic, sclera nonicteric, oropharynx clear  Neck: supple, no JVD, no bruits  Chest: CTA B, no wheezing, good movement  Cardiac: RRR, no murmurs, no edema, 2+ pulses  Extremities: no C/C/E, FROM   Neurologic Exam: Mental Status: alert and oriented x 3, normal speech and language, follows complex commands  Cranial Nerves: PERRLA, EOMI, nl VF, face symmetric, tongue midline, shoulder shrug equal  Motor Exam: 5/5 B normal, tone, no tremor  Deep Tendon Reflexes: 2+/4 B, plantars downgoing B, no Hoffman  Sensory Exam: pinprick, temperature, and vibration intact B  Coordination: FTN and HTS WNL, nl RAM   Lab Results: Thyroid:  20-Oct-15 04:43   Thyroid Stimulating Hormone 1.44 (0.45-4.50 (IU = International Unit)  ----------------------- Pregnant patients have  different reference  ranges for TSH:  - - - - - - - - - -  Pregnant, first trimetser:  0.36 - 2.50 uIU/mL)  LabObservation:  20-Oct-15 08:54   OBSERVATION Reason for Test  Hepatic:  20-Oct-15 04:43   Bilirubin, Total 0.4  Alkaline Phosphatase  222 (46-116 NOTE: New Reference Range  05/07/14)  SGPT (ALT) 28 (14-63 NOTE: New Reference Range 05/07/14)  SGOT (AST) 22  Total Protein, Serum  6.2  Albumin, Serum  3.3  Routine Chem:  19-Oct-15 19:05   Hemoglobin A1c ([facility])  7.6 (The American Diabetes Association recommends that a primary goal of therapy should be <7% and that physicians should reevaluate the treatment regimen in patients with HbA1c values consistently >8%.)  20-Oct-15 04:43   Glucose, Serum  117  BUN  19  Creatinine (comp)  1.39  Sodium, Serum 140  Potassium, Serum 3.9  Chloride,  Serum 106  CO2, Serum 26  Calcium (Total), Serum  8.3  Osmolality (calc) 283  eGFR (African American) >60  eGFR (Non-African American)  53 (eGFR values <61m/min/1.73 m2 may be an indication of chronic kidney disease (CKD). Calculated eGFR, using the MRDR Study equation, is useful in  patients with stable renal function. The eGFR calculation will not be reliable in acutely ill patients when serum creatinine is changing rapidly. It is not useful in patients on dialysis. The eGFR calculation may not be applicable to patients at the low and high extremes of body sizes, pregnant women, and vetetarians.)  Anion Gap 8  Cardiac:  20-Oct-15 04:43   CPK-MB, Serum 2.0 (Result(s) reported on 06 Aug 2014 at 05:17AM.)  Troponin I < 0.02 (0.00-0.05 0.05 ng/mL or less: NEGATIVE  Repeat testing in 3-6 hrs  if clinically indicated. >0.05 ng/mL: POTENTIAL  MYOCARDIAL INJURY. Repeat  testing in 3-6 hrs if  clinically indicated. NOTE: An increase or decrease  of 30% or more on serial  testing suggests a  clinically important change)  Routine UA:  19-Oct-15 22:14   Color (UA) Yellow  Clarity (UA) Clear  Glucose (UA) Negative  Bilirubin (UA) Negative  Ketones (UA) Negative  Specific Gravity (UA) 1.012  Blood (UA) Negative  pH (UA) 5.0  Protein (UA) Negative  Nitrite (UA) Negative  Leukocyte Esterase (UA) Negative (Result(s) reported on 05 Aug 2014 at 10:32PM.)  RBC (UA) 1 /HPF  WBC (UA) 3 /HPF  Bacteria (UA) NONE SEEN  Epithelial Cells (UA) 1 /HPF  Mucous (UA) PRESENT  Hyaline Cast (UA) 6 /LPF (Result(s) reported on 05 Aug 2014 at 10:32PM.)  Routine Hem:  20-Oct-15 04:43   WBC (CBC) 7.9  RBC (CBC)  4.28  Hemoglobin (CBC) 13.2  Hematocrit (CBC)  38.9  Platelet Count (CBC) 160  MCV 91  MCH 30.7  MCHC 33.9  RDW 13.6  Neutrophil % 65.4  Lymphocyte % 22.9  Monocyte % 9.1  Eosinophil % 2.2  Basophil % 0.4  Neutrophil # 5.2  Lymphocyte # 1.8  Monocyte # 0.7  Eosinophil # 0.2   Basophil # 0.0 (Result(s) reported on 06 Aug 2014 at 05:07AM.)   Radiology Results: [place]    20-Oct-15 11:50, [place]ot[place] Doppler Bilateral  [place] Doppler Bilateral   REASON FOR EXAM:    syncope  COMMENTS:       PROCEDURE: [place] - [place]orea[place]OTID DOPPLER BILATERAL  - Aug 06 2014 11:50AM     CLINICAL DATA:  Syncope and hypertension, diabetes    EXAM:  BILATERAL CAROTID DUPLEX ULTRASOUND    TECHNIQUE:  GPearline Cablesscale imaging, color Doppler and duplex ultrasound were  performed of bilateral carotid and vertebral arteries in the neck.    COMPARISON:  None.  FINDINGS:  Criteria: Quantification of carotid stenosis is based on velocity  parameters that correlate the residual internal carotid diameter  with NASCET-based stenosis levels, using the diameter  of the distal  internal carotid lumen as the denominator for stenosis measurement.    The following velocity measurements were obtained:    RIGHT    ICA:  78/13 cm/sec    CCA:  02/33 cm/sec    SYSTOLIC ICA/CCA RATIO:  0.8  DIASTOLIC ICA/CCA RATIO:  0.8    ECA:  122 cm/sec    LEFT    ICA:  75/18 cm/sec    CCA:  435/68 cm/sec    SYSTOLIC ICA/CCA RATIO:  0.7    DIASTOLIC ICA/CCA RATIO:  1.1    ECA:  97 cm/sec  RIGHT CAROTID ARTERY: No significant plaque formation. No  significant luminal narrowing by grayscale imaging. No  hemodynamically significant right ICA stenosis, velocity elevation,  or turbulent flow.    RIGHT VERTEBRAL ARTERY:  Antegrade    LEFT CAROTID ARTERY: No significant plaque formation. No  hemodynamically significant left ICA stenosis, velocity elevation,  or turbulent flow. No significant luminal narrowing by grayscale  imaging.    LEFT VERTEBRAL ARTERY:  Antegrade     IMPRESSION:  No significant carotid atherosclerosis or ICA stenosis by  ultrasound. Degree of narrowing less than 50% bilaterally.      Electronically Signed    By: Daryll Brod M.D.    On: 08/06/2014 14:03         Verified  By: Earl Gala, M.D.,  CT:    20-Oct-15 00:04, CT Head Without Contrast  CT Head Without Contrast   REASON FOR EXAM:    TIA vs ministroke  COMMENTS:       PROCEDURE: CT  - CT HEAD WITHOUT CONTRAST  - Aug 06 2014 12:04AM     CLINICAL DATA:  Possible syncopal episode.    EXAM:  CT HEAD WITHOUT CONTRAST    TECHNIQUE:  Contiguous axial images were obtained from the base of the skull  through the vertex without intravenous contrast.    COMPARISON:  None.  FINDINGS:  Ventricles and sulci appear symmetrical. Postoperative changes with  focal left posterior fossa craniectomy. No mass effect or midline  shift. No abnormal extra-axial fluid collections. Gray-white matter  junctions are distinct. Basal cisterns are not effaced. No evidence  of acute intracranial hemorrhage. No depressed skull fractures.  Visualized paranasal sinuses and mastoid air cells are not  opacified.     IMPRESSION:  No acute intracranial abnormalities.      Electronically Signed    By: Lucienne Capers M.D.    On: 08/06/2014 00:10         Verified By: Neale Burly, M.D.,   Radiology Impression: Radiology Impression: CT of head personally reviewed by me and shows trace white matter changes   Impression/Recommendations: Recommendations:   prior notes reviewed by me reviewed by me   Probable seizure-  given that pt has had other episodes and this episode with incontinence;  pt seems to get aggressive when told that he probably has seizures EEG which can be done as inpatient or outpatient MRI of brain w/wo contrast which can be done as inpatient or outpatient start trileptal 174m BID x 1 week then 3047mBID no driving or operating heavy machinery x 6 months will follow if pt does not leave AMA  Electronic Signatures: SmJamison NeighborMD)  (Signed 20-Oct-15 14:33)  Authored: REFERRING PHYSICIAN, Primary Care Physician, Consult, History of Present Illness, Review of Systems, PAST  MEDICAL/SURGICAL HISTORY, HOME MEDICATIONS, ALLERGIES, Social/Family History, NURSING VITAL SIGNS, Physical Exam-, LAB RESULTS, RADIOLOGY RESULTS, Recommendations  Last Updated: 20-Oct-15 14:33 by Jamison Neighbor (MD)

## 2015-02-08 NOTE — Discharge Summary (Signed)
PATIENT NAME:  Zachary Santos, Zachary Santos MR#:  [id number] DATE OF BIRTH:  [birth year]  DATE OF ADMISSION:  08/05/2014 DATE OF DISCHARGE:  08/06/2014  The patient signed out against medical advice.    DISCHARGE MEDICATIONS: None advised to the patient.   DIAGNOSIS:   1.  Syncopal episode, likely seizure.  2.  Hypertension.  3.  Chronic kidney disease stage III, stable.  4.  Hyperglycemia.  5.  Smoking.   HISTORY OF PRESENTING ILLNESS:  The patient is a 79 year old Caucasian male with past medical history of hypertension, borderline diabetes, benign prostatic hypertrophy and tobacco abuse, brought in by EMS with complaint of passing out. He had been sitting in front of a computer for the whole afternoon and then suddenly he became unconscious, unresponsive, with his head lying on the computer table.  His wife tried to wake him up but he was unresponsive for about 1-2 minutes and then he regained consciousness.  Did not remember the episode. He denied any chest pain, shortness of breath, dizziness or palpitations, weakness, numbness, or diaphoresis prior to the episode. The patient felt mildly nauseated but did not have any vomiting, no abdominal pain.  He had loss of bowel control and had loose stool during this event.   HOSPITAL COURSE AND STAY: The patient was admitted on telemetry monitoring for further workup of his possible seizure and neurologic consult was called. Neurology saw the patient was planning to do an EEG but the patient did not want to stay and he left against medical advice.   LABORATORY DATA IN THE HOSPITAL: A carotid Doppler was done; no significant carotid artery atherosclerosis.   Echocardiogram was done which showed essentially normal study with left ventricular ejection fraction 60%, normal right ventricular size and systolic function, normal right ventricular systolic pressure.   CT scan of the head without contrast was done which showed no acute intracranial abnormality. Again, the  patient left against medical advice so there were no discharge medications advised.   Time spent on discharge summary: 30 minutes.    ____________________________ Ceasar Lund Anselm Jungling, MD vgv:lt D: 08/09/2014 20:34:53 ET T: 08/10/2014 08:48:50 ET JOB#: [id number]  cc: Ceasar Lund. Anselm Jungling, MD, <Dictator> Vaughan Basta MD ELECTRONICALLY SIGNED 08/18/2014 18:07

## 2015-02-14 ENCOUNTER — Other Ambulatory Visit: Payer: Self-pay | Admitting: Nurse Practitioner

## 2015-02-14 DIAGNOSIS — E119 Type 2 diabetes mellitus without complications: Secondary | ICD-10-CM

## 2015-02-14 MED ORDER — GLIPIZIDE ER 2.5 MG PO TB24: 2.5000 mg | ORAL_TABLET | Freq: Every day | ORAL | Status: DC

## 2015-03-14 ENCOUNTER — Ambulatory Visit: Payer: Self-pay | Admitting: Nurse Practitioner

## 2015-03-14 DIAGNOSIS — Z0289 Encounter for other administrative examinations: Secondary | ICD-10-CM

## 2015-03-23 ENCOUNTER — Inpatient Hospital Stay
Admission: EM | Admit: 2015-03-23 | Discharge: 2015-03-25 | DRG: 871 | Disposition: A | Discharge status: Home / Self Care | Payer: Medicare Other | Attending: Specialist | Admitting: Specialist

## 2015-03-23 ENCOUNTER — Encounter: Payer: Self-pay | Admitting: *Deleted

## 2015-03-23 ENCOUNTER — Emergency Department: Payer: Medicare Other

## 2015-03-23 DIAGNOSIS — R197 Diarrhea, unspecified: Secondary | ICD-10-CM | POA: Diagnosis present

## 2015-03-23 DIAGNOSIS — M889 Osteitis deformans of unspecified bone: Secondary | ICD-10-CM | POA: Diagnosis present

## 2015-03-23 DIAGNOSIS — N170 Acute kidney failure with tubular necrosis: Secondary | ICD-10-CM | POA: Diagnosis present

## 2015-03-23 DIAGNOSIS — M179 Osteoarthritis of knee, unspecified: Secondary | ICD-10-CM | POA: Diagnosis present

## 2015-03-23 DIAGNOSIS — I129 Hypertensive chronic kidney disease with stage 1 through stage 4 chronic kidney disease, or unspecified chronic kidney disease: Secondary | ICD-10-CM | POA: Diagnosis present

## 2015-03-23 DIAGNOSIS — N400 Enlarged prostate without lower urinary tract symptoms: Secondary | ICD-10-CM | POA: Diagnosis present

## 2015-03-23 DIAGNOSIS — E1165 Type 2 diabetes mellitus with hyperglycemia: Secondary | ICD-10-CM

## 2015-03-23 DIAGNOSIS — R001 Bradycardia, unspecified: Secondary | ICD-10-CM | POA: Diagnosis not present

## 2015-03-23 DIAGNOSIS — N1831 Chronic kidney disease, stage 3a: Secondary | ICD-10-CM | POA: Diagnosis present

## 2015-03-23 DIAGNOSIS — R1110 Vomiting, unspecified: Secondary | ICD-10-CM

## 2015-03-23 DIAGNOSIS — E119 Type 2 diabetes mellitus without complications: Secondary | ICD-10-CM | POA: Diagnosis present

## 2015-03-23 DIAGNOSIS — N179 Acute kidney failure, unspecified: Secondary | ICD-10-CM

## 2015-03-23 DIAGNOSIS — E785 Hyperlipidemia, unspecified: Secondary | ICD-10-CM | POA: Diagnosis present

## 2015-03-23 DIAGNOSIS — I959 Hypotension, unspecified: Secondary | ICD-10-CM

## 2015-03-23 DIAGNOSIS — F17210 Nicotine dependence, cigarettes, uncomplicated: Secondary | ICD-10-CM | POA: Diagnosis present

## 2015-03-23 DIAGNOSIS — Z79899 Other long term (current) drug therapy: Secondary | ICD-10-CM | POA: Diagnosis not present

## 2015-03-23 DIAGNOSIS — J449 Chronic obstructive pulmonary disease, unspecified: Secondary | ICD-10-CM | POA: Diagnosis present

## 2015-03-23 DIAGNOSIS — R55 Syncope and collapse: Secondary | ICD-10-CM | POA: Diagnosis present

## 2015-03-23 DIAGNOSIS — Z9119 Patient's noncompliance with other medical treatment and regimen: Secondary | ICD-10-CM | POA: Diagnosis present

## 2015-03-23 DIAGNOSIS — Z85828 Personal history of other malignant neoplasm of skin: Secondary | ICD-10-CM

## 2015-03-23 DIAGNOSIS — A419 Sepsis, unspecified organism: Secondary | ICD-10-CM | POA: Diagnosis present

## 2015-03-23 DIAGNOSIS — N183 Chronic kidney disease, stage 3 (moderate): Secondary | ICD-10-CM | POA: Diagnosis present

## 2015-03-23 DIAGNOSIS — F17200 Nicotine dependence, unspecified, uncomplicated: Secondary | ICD-10-CM | POA: Diagnosis present

## 2015-03-23 DIAGNOSIS — Z720 Tobacco use: Secondary | ICD-10-CM | POA: Diagnosis not present

## 2015-03-23 DIAGNOSIS — I10 Essential (primary) hypertension: Secondary | ICD-10-CM | POA: Diagnosis present

## 2015-03-23 DIAGNOSIS — Z66 Do not resuscitate: Secondary | ICD-10-CM | POA: Diagnosis present

## 2015-03-23 DIAGNOSIS — Z7982 Long term (current) use of aspirin: Secondary | ICD-10-CM

## 2015-03-23 HISTORY — DX: Essential (primary) hypertension: I10

## 2015-03-23 HISTORY — DX: Type 2 diabetes mellitus without complications: E11.9

## 2015-03-23 HISTORY — DX: Chronic kidney disease, stage 3 unspecified: N18.30

## 2015-03-23 HISTORY — DX: Peripheral vascular disease, unspecified: I73.9

## 2015-03-23 HISTORY — DX: Syncope and collapse: R55

## 2015-03-23 HISTORY — DX: Chronic kidney disease, stage 3 (moderate): N18.3

## 2015-03-23 HISTORY — DX: Unspecified osteoarthritis, unspecified site: M19.90

## 2015-03-23 LAB — CBC WITH DIFFERENTIAL/PLATELET
Basophils Absolute: 0 10*3/uL (ref 0–0.1)
Basophils Relative: 0 %
Eosinophils Absolute: 0.3 10*3/uL (ref 0–0.7)
Eosinophils Relative: 3 %
HCT: 41.9 % (ref 40.0–52.0)
Hemoglobin: 13.9 g/dL (ref 13.0–18.0)
Lymphocytes Relative: 23 %
Lymphs Abs: 2 10*3/uL (ref 1.0–3.6)
MCH: 30.3 pg (ref 26.0–34.0)
MCHC: 33.3 g/dL (ref 32.0–36.0)
MCV: 91 fL (ref 80.0–100.0)
Monocytes Absolute: 0.6 10*3/uL (ref 0.2–1.0)
Monocytes Relative: 7 %
Neutro Abs: 5.8 10*3/uL (ref 1.4–6.5)
Neutrophils Relative %: 67 %
Platelets: 184 10*3/uL (ref 150–440)
RBC: 4.6 MIL/uL (ref 4.40–5.90)
RDW: 13.4 % (ref 11.5–14.5)
WBC: 8.7 10*3/uL (ref 3.8–10.6)

## 2015-03-23 LAB — GLUCOSE, CAPILLARY
Glucose-Capillary: 209 mg/dL — ABNORMAL HIGH (ref 65–99)
Glucose-Capillary: 131 mg/dL — ABNORMAL HIGH (ref 65–99)

## 2015-03-23 LAB — COMPREHENSIVE METABOLIC PANEL
ALT: 18 U/L (ref 17–63)
AST: 23 U/L (ref 15–41)
Albumin: 4.3 g/dL (ref 3.5–5.0)
Alkaline Phosphatase: 236 U/L — ABNORMAL HIGH (ref 38–126)
Anion gap: 12 (ref 5–15)
BUN: 30 mg/dL — ABNORMAL HIGH (ref 6–20)
CO2: 20 mmol/L — ABNORMAL LOW (ref 22–32)
Calcium: 8.9 mg/dL (ref 8.9–10.3)
Chloride: 104 mmol/L (ref 101–111)
Creatinine, Ser: 2.14 mg/dL — ABNORMAL HIGH (ref 0.61–1.24)
GFR calc Af Amer: 33 mL/min — ABNORMAL LOW (ref >60)
GFR calc non Af Amer: 28 mL/min — ABNORMAL LOW (ref >60)
Glucose, Bld: 246 mg/dL — ABNORMAL HIGH (ref 65–99)
Potassium: 3.1 mmol/L — ABNORMAL LOW (ref 3.5–5.1)
Sodium: 136 mmol/L (ref 135–145)
Total Bilirubin: 0.8 mg/dL (ref 0.3–1.2)
Total Protein: 7.1 g/dL (ref 6.5–8.1)

## 2015-03-23 LAB — URINALYSIS COMPLETE WITH MICROSCOPIC (ARMC ONLY)
Bilirubin Urine: NEGATIVE
Glucose, UA: 150 mg/dL — AB
Hgb urine dipstick: NEGATIVE
Nitrite: NEGATIVE
Protein, ur: NEGATIVE mg/dL
Specific Gravity, Urine: 1.014 (ref 1.005–1.030)
pH: 5 (ref 5.0–8.0)

## 2015-03-23 LAB — TROPONIN I: Troponin I: <0.03 ng/mL (ref <0.031)

## 2015-03-23 LAB — MRSA PCR SCREENING: MRSA by PCR: NEGATIVE

## 2015-03-23 MED ORDER — PIPERACILLIN-TAZOBACTAM 3.375 G IVPB
INTRAVENOUS | Status: AC
  Administered 2015-03-23: 3.375 g via INTRAVENOUS
  Filled 2015-03-23: qty 50

## 2015-03-23 MED ORDER — ACETAMINOPHEN 650 MG RE SUPP: 650.0000 mg | Freq: Four times a day (QID) | RECTAL | Status: DC | PRN

## 2015-03-23 MED ORDER — HEPARIN SODIUM (PORCINE) 5000 UNIT/ML IJ SOLN
5000.0000 [IU] | Freq: Three times a day (TID) | INTRAMUSCULAR | Status: DC
  Administered 2015-03-23 – 2015-03-24 (×2): 5000 [IU] via SUBCUTANEOUS
  Filled 2015-03-23 (×2): qty 1

## 2015-03-23 MED ORDER — ALUM & MAG HYDROXIDE-SIMETH 200-200-20 MG/5ML PO SUSP: 30.0000 mL | Freq: Four times a day (QID) | ORAL | Status: DC | PRN

## 2015-03-23 MED ORDER — SODIUM CHLORIDE 0.9 % IV BOLUS (SEPSIS)
1000.0000 mL | INTRAVENOUS | Status: AC
  Administered 2015-03-23 (×3): 1000 mL via INTRAVENOUS

## 2015-03-23 MED ORDER — POTASSIUM CHLORIDE CRYS ER 20 MEQ PO TBCR
40.0000 meq | EXTENDED_RELEASE_TABLET | Freq: Once | ORAL | Status: AC
  Administered 2015-03-23: 40 meq via ORAL
  Filled 2015-03-23: qty 2

## 2015-03-23 MED ORDER — ONDANSETRON HCL 4 MG/2ML IJ SOLN: 4.0000 mg | Freq: Four times a day (QID) | INTRAMUSCULAR | Status: DC | PRN

## 2015-03-23 MED ORDER — SODIUM CHLORIDE 0.9 % IV SOLN
1250.0000 mg | INTRAVENOUS | Status: DC
  Administered 2015-03-23 – 2015-03-24 (×2): 1250 mg via INTRAVENOUS
  Filled 2015-03-23 (×3): qty 1250

## 2015-03-23 MED ORDER — PIPERACILLIN-TAZOBACTAM 3.375 G IVPB
3.3750 g | Freq: Three times a day (TID) | INTRAVENOUS | Status: DC
  Administered 2015-03-24 – 2015-03-25 (×4): 3.375 g via INTRAVENOUS
  Filled 2015-03-23 (×9): qty 50

## 2015-03-23 MED ORDER — ONDANSETRON HCL 4 MG PO TABS: 4.0000 mg | ORAL_TABLET | Freq: Four times a day (QID) | ORAL | Status: DC | PRN

## 2015-03-23 MED ORDER — INSULIN ASPART 100 UNIT/ML ~~LOC~~ SOLN
0.0000 [IU] | Freq: Three times a day (TID) | SUBCUTANEOUS | Status: DC
  Administered 2015-03-23 – 2015-03-24 (×2): 2 [IU] via SUBCUTANEOUS
  Administered 2015-03-24 – 2015-03-25 (×2): 1 [IU] via SUBCUTANEOUS
  Filled 2015-03-23 (×2): qty 1
  Filled 2015-03-23: qty 2

## 2015-03-23 MED ORDER — VANCOMYCIN HCL IN DEXTROSE 1-5 GM/200ML-% IV SOLN
1000.0000 mg | Freq: Once | INTRAVENOUS | Status: AC
  Administered 2015-03-23: 1000 mg via INTRAVENOUS

## 2015-03-23 MED ORDER — SENNOSIDES-DOCUSATE SODIUM 8.6-50 MG PO TABS
1.0000 | ORAL_TABLET | Freq: Every evening | ORAL | Status: DC | PRN
Start: 2015-03-23 — End: 2015-03-25

## 2015-03-23 MED ORDER — PIPERACILLIN-TAZOBACTAM 3.375 G IVPB 30 MIN
3.3750 g | Freq: Four times a day (QID) | INTRAVENOUS | Status: DC
  Administered 2015-03-23: 3.375 g via INTRAVENOUS

## 2015-03-23 MED ORDER — NICOTINE 21 MG/24HR TD PT24
21.0000 mg | MEDICATED_PATCH | Freq: Every day | TRANSDERMAL | Status: DC
  Administered 2015-03-23 – 2015-03-25 (×4): 21 mg via TRANSDERMAL
  Filled 2015-03-23 (×4): qty 1

## 2015-03-23 MED ORDER — INSULIN ASPART 100 UNIT/ML ~~LOC~~ SOLN
0.0000 [IU] | Freq: Every day | SUBCUTANEOUS | Status: DC
  Filled 2015-03-23: qty 2

## 2015-03-23 MED ORDER — ACETAMINOPHEN 325 MG PO TABS
650.0000 mg | ORAL_TABLET | Freq: Four times a day (QID) | ORAL | Status: DC | PRN
Start: 2015-03-23 — End: 2015-03-25

## 2015-03-23 MED ORDER — SODIUM CHLORIDE 0.9 % IV SOLN
INTRAVENOUS | Status: DC
  Administered 2015-03-23 – 2015-03-25 (×4): via INTRAVENOUS

## 2015-03-23 MED ORDER — OXYCODONE HCL 5 MG PO TABS: 5.0000 mg | ORAL_TABLET | ORAL | Status: DC | PRN

## 2015-03-23 MED ORDER — PIPERACILLIN-TAZOBACTAM 3.375 G IVPB 30 MIN
3.3750 g | Freq: Once | INTRAVENOUS | Status: AC
  Administered 2015-03-23: 3.375 g via INTRAVENOUS

## 2015-03-23 MED ORDER — VANCOMYCIN HCL IN DEXTROSE 1-5 GM/200ML-% IV SOLN
INTRAVENOUS | Status: AC
  Filled 2015-03-23: qty 200

## 2015-03-23 NOTE — Progress Notes (Signed)
ANTIBIOTIC CONSULT NOTE - INITIAL  Pharmacy Consult for Vancomycin/Zosyn Indication: rule out sepsis  No Known Allergies  Patient Measurements: Height: 6' (182.9 cm) Weight: 194 lb (87.998 kg) IBW/kg (Calculated) : 77.6   Vital Signs: Temp: 97.6 F (36.4 C) (06/05 1836) Temp Source: Oral (06/05 1836) BP: 123/71 mmHg (06/05 1836) Pulse Rate: 67 (06/05 1836) Intake/Output from previous day:   Intake/Output from this shift:    Labs:  Recent Labs  03/23/15 1450  WBC 8.7  HGB 13.9  PLT 184  CREATININE 2.14*   Estimated Creatinine Clearance: 32.2 mL/min (by C-G formula based on Cr of 2.14). No results for input(s): VANCOTROUGH, VANCOPEAK, VANCORANDOM, GENTTROUGH, GENTPEAK, GENTRANDOM, TOBRATROUGH, TOBRAPEAK, TOBRARND, AMIKACINPEAK, AMIKACINTROU, AMIKACIN in the last 72 hours.   Microbiology: Recent Results (from the past 720 hour(s))  MRSA PCR Screening     Status: None   Collection Time: 03/23/15  6:34 PM  Result Value Ref Range Status   MRSA by PCR NEGATIVE NEGATIVE Final    Comment:        The GeneXpert MRSA Assay (FDA approved for NASAL specimens only), is one component of a comprehensive MRSA colonization surveillance program. It is not intended to diagnose MRSA infection nor to guide or monitor treatment for MRSA infections.     Medical History: Past Medical History  Diagnosis Date  . Chronic fatigue   . Stable angina   . COPD (chronic obstructive pulmonary disease)   . Hypertension   . Acute renal disease   . Paget's disease   . Tobacco abuse      Assessment: 79 yo male here with sepsis with hypotension starting on empiric abx with vancomycin and Zosyn Pt received vancomycin 1 g IV x1 in the ED at 1518.  ke 0.031, half life 22.4 h, Vd 61.6 L UCx and BCx x2 pending  Goal of Therapy:  Vancomycin trough level 15-20 mcg/ml  Plan:  Will order vancomycin 1250 mg IV q24h to start 6h after initial dosing for stacked dosing Will order trough  prior to 4th dose on 6/8 at 2100 Will need to continue to monitor renal function and adjust doses accordingly  Will continue current orders for Zosyn 3.375 g IV q8h EI  Rayna Sexton, PharmD, BCPS Clinical Pharmacist 03/23/2015,8:52 PM

## 2015-03-23 NOTE — ED Notes (Signed)
Pt called ambulance for weakness and nausea today

## 2015-03-23 NOTE — H&P (Signed)
[facility] [place] NAME: Zachary Santos    MR#:  [id number]  DATE OF BIRTH:  01-Jun-1936  DATE OF ADMISSION:  03/23/2015  PRIMARY CARE PHYSICIAN: Rubbie Battiest, NP   REQUESTING/REFERRING PHYSICIAN: Dr Dineen Kid  CHIEF COMPLAINT:  Low blood pressure and weakness  HISTORY OF PRESENT ILLNESS:  Zachary Santos  is a 79 y.o. male with a known history of hypertension and diabetes who presents with above complaint. Patient says this morning he was in his usual state of health. However about mid morning he started feeling lightheaded. He asked his nephew to check his blood pressure. His blood pressure was reportedly 87/43. At the same time patient was feeling dizzy, diaphoretic and clammy. Apparently he lost consciousness for approximately 30 seconds. When he came to he was very weak and subsequently had nausea and vomiting and one episode of loose stool. He asked his wife to call EMS. Upon arrival by EMS his blood pressure was low and his blood sugars were within normal limits. While in the emergency department he was noted to the hypotensive and hypothermic. He is currently with a bear hugger. He has received broad-spectrum antibiotically including vancomycin and Zosyn. A urine analysis is still pending. His chest x-ray does not show evidence of pneumonia.  PAST MEDICAL HISTORY:   Past Medical History  Diagnosis Date  . Chronic fatigue   . Stable angina   . COPD (chronic obstructive pulmonary disease)   . Hypertension   . Acute renal disease   . Paget's disease   . Tobacco abuse     PAST SURGICAL HISTORY:   Past Surgical History  Procedure Laterality Date  . Spine surgery      cervical spine surgery  . Appendectomy    . Laparscopic chole    . Trigeminal neuralgia      with surgery in [place] HISTORY:   History  Substance Use Topics  . Smoking status: Current Every Day Smoker -- 1.00 packs/day for 60 years    Types:  Cigarettes  . Smokeless tobacco: Not on file  . Alcohol Use: Yes    FAMILY HISTORY:  Positive for breast cancer and his father had asthma  DRUG ALLERGIES:  No Known Allergies   REVIEW OF SYSTEMS:  CONSTITUTIONAL: + fever, fatigue and weakness.  EYES: No blurred or double vision.  EARS, NOSE, AND THROAT: No tinnitus or ear pain.  RESPIRATORY: No cough, shortness of breath, wheezing or hemoptysis.  CARDIOVASCULAR: No chest pain, orthopnea, edema.  GASTROINTESTINAL: He had some nausea and vomiting and loose stools this morning. This only occurred one time and is subsequently resolved.  GENITOURINARY: No dysuria, hematuria.  ENDOCRINE: No polyuria, nocturia,  HEMATOLOGY: No anemia, easy bruising or bleeding SKIN: No rash or lesion. MUSCULOSKELETAL: No joint pain or arthritis.   NEUROLOGIC: No tingling, numbness, weakness.  PSYCHIATRY: No anxiety or depression.   MEDICATIONS AT HOME:   Prior to Admission medications   Medication Sig Start Date End Date Taking? Authorizing Provider  albuterol (PROVENTIL HFA;VENTOLIN HFA) 108 (90 BASE) MCG/ACT inhaler Inhale 2 puffs into the lungs every 6 (six) hours as needed for wheezing. 04/18/14   Nischal Narendra, MD  amLODipine (NORVASC) 10 MG tablet Take 1 tablet (10 mg total) by mouth daily. 04/18/14   Aldine Contes, MD  aspirin 81 MG EC tablet Take 1 tablet (81 mg total) by mouth daily. 04/18/14   Aldine Contes, MD  doxazosin (CARDURA)  2 MG tablet TAKE TWO TABLETS BY MOUTH AT BEDTIME 04/18/14   Nischal Narendra, MD  Fluticasone-Salmeterol (ADVAIR) 250-50 MCG/DOSE AEPB Inhale 1 puff into the lungs every 12 (twelve) hours. 04/18/14   Nischal Narendra, MD  glipiZIDE (GLUCOTROL XL) 2.5 MG 24 hr tablet Take 1 tablet (2.5 mg total) by mouth daily with breakfast. 02/14/15   Rubbie Battiest, NP  lisinopril-hydrochlorothiazide (PRINZIDE) 10-12.5 MG per tablet Take 1 tablet by mouth daily. 04/18/14 04/18/15  Nischal Narendra, MD  triamcinolone (KENALOG) 0.025 %  ointment Apply topically 4 (four) times daily as needed. 04/18/14   Nischal Narendra, MD      VITAL SIGNS:  Blood pressure 106/53, pulse 73, temperature 95 F (35 C), temperature source Rectal, resp. rate 17, height 6' (1.829 m), weight 87.998 kg (194 lb), SpO2 95 %.  PHYSICAL EXAMINATION:  GENERAL:  79 y.o.-year-old patient lying in the bed with no acute distress. He has a bear hugger  EYES: Pupils equal, round, reactive to light and accommodation. No scleral icterus. Extraocular muscles intact.  HEENT: Head atraumatic, normocephalic. Oropharynx and nasopharynx clear.  NECK:  Supple, no jugular venous distention. No thyroid enlargement, no tenderness.  LUNGS: Normal breath sounds bilaterally, no wheezing, rales,rhonchi or crepitation. No use of accessory muscles of respiration.  CARDIOVASCULAR: S1, S2 normal. No murmurs, rubs, or gallops.  ABDOMEN: Soft, nontender, nondistended. Bowel sounds present. No organomegaly or mass.  EXTREMITIES: No pedal edema, cyanosis, or clubbing.  NEUROLOGIC: Cranial nerves II through XII are intact. Muscle strength 5/5 in all extremities. Sensation intact. Gait not checked.  PSYCHIATRIC: The patient is alert and oriented x 3.  SKIN: No obvious rash, lesion, or ulcer.   LABORATORY PANEL:   CBC  Recent Labs Lab 03/23/15 1450  WBC 8.7  HGB 13.9  HCT 41.9  PLT 184   ------------------------------------------------------------------------------------------------------------------  Chemistries   Recent Labs Lab 03/23/15 1450  NA 136  K 3.1*  CL 104  CO2 20*  GLUCOSE 246*  BUN 30*  CREATININE 2.14*  CALCIUM 8.9  AST 23  ALT 18  ALKPHOS 236*  BILITOT 0.8   ------------------------------------------------------------------------------------------------------------------  Cardiac Enzymes No results for input(s): TROPONINI in the last 168  hours. ------------------------------------------------------------------------------------------------------------------  RADIOLOGY:  Dg Chest Port 1 View  03/23/2015     IMPRESSION: Stable exam.  No active disease.   Electronically Signed   By: Earle Gell M.D.   On: 03/23/2015 15:07    EKG:   Orders placed or performed during the hospital encounter of 06/08/11  . EKG   EKG shows sinus rhythm with PACs  IMPRESSION AND PLAN:  This is a 79 year old male with a history of hypertension and diabetes who presents with syncope and subsequently had one episode of nausea vomiting and loose stool. He is noted to be hypothermic and hypo-tensive with a diagnosis of sepsis.   1. Sepsis with hypotension: Unclear etiology at this point. His chest x-ray shows no evidence of pneumonia. His urine analysis is still pending. Blood cultures and urine culture has been ordered. For now it would be prudent to continue using broad-spectrum antibiosis. I will continue Zosyn and vancomycin. Pharmacy will help dose these medications. We will will need to follow up on blood and urine cultures.  2. Loss of consciousness: I will order troponin levels as well as echocardiogram to rule out acute coronary syndrome.  3. Hypothermia: Secondary to sepsis. Patient will need to continue with the bear hugger.  4. Hypotension with a history of hypertension: I will  discontinue blood pressure medications at this time. I will continue IV fluids and monitoring his blood pressure. At this time he does not need pressors. If needed will start Levophed.  5. Acute renal failure: Patient is on lisinopril which can cause acute renal failure. His  acute renal failure could also be secondary to ATN from above mentioned issues. I will monitor I's and O's. I will repeat a BMP in the a.m. I will hold nephrotoxic agents.  6. Diabetes: Patient has been off of his glipizide for 2-4 weeks as he had no refills. Patient will be placed on ADA diet and  sliding scale insulin. I will repeat start glipizide as well.  7. Nausea, vomiting and diarrhea: This all seems to have resolved. This was after his subsequent loss of consciousness.  8. Tobacco dependence: Patient was highly encouraged to stop smoking. Patient is not interested in putting smoking at this time. Patient was counseled for 4 minutes. Patient doesn't want a nicotine patch.   All the records are reviewed and case discussed with ED provider. Management plans discussed with the patient and family and they are in agreement.  CODE STATUS: DNR  TOTAL TIME TAKING CARE OF THIS PATIENT: 50 minutes.    Stormey Wilborn M.D on 03/23/2015 at 4:15 PM  Between 7am to 6pm - Pager - [phone] After 6pm go to [email or web address] - password [email or web address] [facility]  Office  [phone]  CC: Primary care physician; Rubbie Battiest, NP

## 2015-03-23 NOTE — ED Provider Notes (Signed)
[facility] Emergency Department Provider Note  ____________________________________________  Time seen: Seen upon arrival to the emergency department  I have reviewed the triage vital signs and the nursing notes.   HISTORY  Chief Complaint Weakness    HPI Zachary Santos is a 79 y.o. male with a history of hypertension and diabetes who presents with diarrhea with nausea and vomiting for the past day. He has become progressively weak today which is why the ambulance was called. He denies any blood in his vomit or diarrhea. Denies any pain at this time. Says that he just, "feels weak."No known recent travel or antibiotics. Per the medics, he was dry heaving on his transport to the emergency department.   Past Medical History  Diagnosis Date  . Chronic fatigue   . Stable angina   . COPD (chronic obstructive pulmonary disease)   . Hypertension   . Acute renal disease   . Paget's disease   . Tobacco abuse     Patient Active Problem List   Diagnosis Date Noted  . Rash and nonspecific skin eruption 04/18/2014  . Diabetes type 2, controlled 09/12/2013  . Osteoarthritis of right knee 06/07/2013  . Neoplasm of uncertain behavior of skin 06/28/2011  . Healthcare maintenance 06/28/2011  . 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 BENIGN PROSTATIC HYPERTROPHY, HX OF 02/02/2010  . HEMOPTYSIS 12/18/2008  . DYSLIPIDEMIA 03/13/2008  . Chronic kidney disease, unspecified 02/14/2008  . CONTACT DERMATITIS&OTH ECZEMA DUE OTH [place] AGENT 02/14/2008  . TOBACCO ABUSE 02/05/2008  . NEURALGIA, TRIGEMINAL 02/05/2008  . ESSENTIAL HYPERTENSION, BENIGN 02/05/2008  . COPD 02/05/2008  . PAGET'S DISEASE 02/05/2008    Past Surgical History  Procedure Laterality Date  . Spine surgery      cervical spine surgery  . Appendectomy    . Laparscopic chole    . Trigeminal neuralgia      with surgery in [place]    Current Outpatient Rx  Name  Route  Sig  Dispense  Refill   . albuterol (PROVENTIL HFA;VENTOLIN HFA) 108 (90 BASE) MCG/ACT inhaler   Inhalation   Inhale 2 puffs into the lungs every 6 (six) hours as needed for wheezing.   8.5 g   3   . amLODipine (NORVASC) 10 MG tablet   Oral   Take 1 tablet (10 mg total) by mouth daily.   90 tablet   3   . aspirin 81 MG EC tablet   Oral   Take 1 tablet (81 mg total) by mouth daily.   30 tablet   6   . doxazosin (CARDURA) 2 MG tablet      TAKE TWO TABLETS BY MOUTH AT BEDTIME   90 tablet   3   . Fluticasone-Salmeterol (ADVAIR) 250-50 MCG/DOSE AEPB   Inhalation   Inhale 1 puff into the lungs every 12 (twelve) hours.   14 each   2   . glipiZIDE (GLUCOTROL XL) 2.5 MG 24 hr tablet   Oral   Take 1 tablet (2.5 mg total) by mouth daily with breakfast.   90 tablet   1   . lisinopril-hydrochlorothiazide (PRINZIDE) 10-12.5 MG per tablet   Oral   Take 1 tablet by mouth daily.   30 tablet   11   . triamcinolone (KENALOG) 0.025 % ointment   Topical   Apply topically 4 (four) times daily as needed.   30 g   3     Allergies Review of patient's allergies indicates no known allergies.  No family history on file.  Social History History  Substance Use Topics  . Smoking status: Current Every Day Smoker -- 1.00 packs/day for 60 years    Types: Cigarettes  . Smokeless tobacco: Not on file  . Alcohol Use: Yes    Review of Systems Constitutional: Weakness, diffuse  Eyes: No visual changes. ENT: No sore throat. Cardiovascular: Denies chest pain. Respiratory: Denies shortness of breath. Gastrointestinal: No abdominal pain.   Genitourinary: Negative for dysuria. Musculoskeletal: Negative for back pain. Skin: Negative for rash. Neurological: Negative for headaches, focal weakness or numbness.  10-point ROS otherwise negative.  ____________________________________________   PHYSICAL EXAM:  VITAL SIGNS: ED Triage Vitals  Enc Vitals Group     BP 03/23/15 1430 82/50 mmHg     Pulse Rate  03/23/15 1430 66     Resp --      Temp 03/23/15 1430 95 F (35 C)     Temp Source 03/23/15 1430 Rectal     SpO2 03/23/15 1430 96 %     Weight 03/23/15 1430 194 lb (87.998 kg)     Height 03/23/15 1430 6' (1.829 m)     Head Cir --      Peak Flow --      Pain Score --      Pain Loc --      Pain Edu? --      Excl. in [place]? --     Constitutional: Alert and oriented. Pale. Ill appearing. Eyes: Conjunctivae are normal. PERRL. EOMI. Head: Atraumatic. Nose: No congestion/rhinnorhea. Mouth/Throat: Mucous membranes are moist.  Oropharynx non-erythematous. Neck: No stridor.   Cardiovascular: Normal rate, regular rhythm. Grossly normal heart sounds.  Good peripheral circulation. Respiratory: Normal respiratory effort.  No retractions. Lungs CTAB. Gastrointestinal: Soft and nontender. No distention. No abdominal bruits. No CVA tenderness. Musculoskeletal: No lower extremity tenderness.  Moderate edema bilaterally.  No joint effusions. Neurologic:  Normal speech and language. No gross focal neurologic deficits are appreciated. Speech is normal. Skin:  Skin is warm, dry and intact. No rash noted. Psychiatric: Mood and affect are normal. Speech and behavior are normal.  ____________________________________________   LABS (all labs ordered are listed, but only abnormal results are displayed)  Labs Reviewed  COMPREHENSIVE METABOLIC PANEL - Abnormal; Notable for the following:    Potassium 3.1 (*)    CO2 20 (*)    Glucose, Bld 246 (*)    BUN 30 (*)    Creatinine, Ser 2.14 (*)    Alkaline Phosphatase 236 (*)    GFR calc non Af Amer 28 (*)    GFR calc Af Amer 33 (*)    All other components within normal limits  CULTURE, BLOOD (ROUTINE X 2)  CULTURE, BLOOD (ROUTINE X 2)  URINE CULTURE  CBC WITH DIFFERENTIAL/PLATELET  URINALYSIS COMPLETEWITH MICROSCOPIC ([facility] ONLY)  I-STAT CG4 LACTIC ACID, ED  I-STAT CG4 LACTIC ACID, ED   ____________________________________________  EKG  ED ECG  REPORT I, Doran Stabler, the attending physician, personally viewed and interpreted this ECG.   Date: 03/23/2015  EKG Time: 1416  Rate: 64  Rhythm: Sinus rhythm with premature atrial complexes  Axis: Normal axis  Intervals:none  ST&T Change: Biphasic T waves in 3 and aVF. Unchanged from the EKG in August 2012.  ____________________________________________  RADIOLOGY  stable exam. No active disease. ____________________________________________   PROCEDURES    ____________________________________________   INITIAL IMPRESSION / ASSESSMENT AND PLAN / ED COURSE  Pertinent labs & imaging results that were available  during my care of the patient were reviewed by me and considered in my medical decision making (see chart for details).  ----------------------------------------- 3:33 PM on 03/23/2015 -----------------------------------------  Patient reassessed. Improving blood pressure. Sepsis protocol initiated. We'll admit to the medicine service. Pending KUB which the medicine service will follow.  ____________________________________________   FINAL CLINICAL IMPRESSION(S) / ED DIAGNOSES  Acute sepsis from unknown source. Nausea vomiting and diarrhea. Acute kidney injury. Initial visit.    Orbie Pyo, MD 03/23/15 [phone]

## 2015-03-23 NOTE — Progress Notes (Signed)
Have called dr hower twice about orders for pt neded potassium level on adm only 3.1 nd also did not do lactic acid on admission received new orders for pt.me

## 2015-03-24 ENCOUNTER — Inpatient Hospital Stay: Admit: 2015-03-24 | Payer: Medicare Other

## 2015-03-24 ENCOUNTER — Encounter: Payer: Self-pay | Admitting: Nurse Practitioner

## 2015-03-24 DIAGNOSIS — I959 Hypotension, unspecified: Secondary | ICD-10-CM

## 2015-03-24 DIAGNOSIS — N1831 Chronic kidney disease, stage 3a: Secondary | ICD-10-CM | POA: Diagnosis present

## 2015-03-24 DIAGNOSIS — N183 Chronic kidney disease, stage 3 (moderate): Secondary | ICD-10-CM

## 2015-03-24 DIAGNOSIS — Z720 Tobacco use: Secondary | ICD-10-CM

## 2015-03-24 DIAGNOSIS — I10 Essential (primary) hypertension: Secondary | ICD-10-CM | POA: Diagnosis present

## 2015-03-24 DIAGNOSIS — R55 Syncope and collapse: Secondary | ICD-10-CM

## 2015-03-24 LAB — CBC
HCT: 38.1 % — ABNORMAL LOW (ref 40.0–52.0)
Hemoglobin: 12.8 g/dL — ABNORMAL LOW (ref 13.0–18.0)
MCH: 30.4 pg (ref 26.0–34.0)
MCHC: 33.4 g/dL (ref 32.0–36.0)
MCV: 90.8 fL (ref 80.0–100.0)
Platelets: 147 10*3/uL — ABNORMAL LOW (ref 150–440)
RBC: 4.2 MIL/uL — ABNORMAL LOW (ref 4.40–5.90)
RDW: 13.2 % (ref 11.5–14.5)
WBC: 7.5 10*3/uL (ref 3.8–10.6)

## 2015-03-24 LAB — BASIC METABOLIC PANEL
Anion gap: 6 (ref 5–15)
BUN: 20 mg/dL (ref 6–20)
CO2: 24 mmol/L (ref 22–32)
Calcium: 8.7 mg/dL — ABNORMAL LOW (ref 8.9–10.3)
Chloride: 109 mmol/L (ref 101–111)
Creatinine, Ser: 1.67 mg/dL — ABNORMAL HIGH (ref 0.61–1.24)
GFR calc Af Amer: 44 mL/min — ABNORMAL LOW (ref >60)
GFR calc non Af Amer: 38 mL/min — ABNORMAL LOW (ref >60)
Glucose, Bld: 113 mg/dL — ABNORMAL HIGH (ref 65–99)
Potassium: 4.5 mmol/L (ref 3.5–5.1)
Sodium: 139 mmol/L (ref 135–145)

## 2015-03-24 LAB — MAGNESIUM: Magnesium: 1.8 mg/dL (ref 1.7–2.4)

## 2015-03-24 LAB — TROPONIN I
Troponin I: <0.03 ng/mL (ref <0.031)
Troponin I: <0.03 ng/mL (ref <0.031)

## 2015-03-24 LAB — GLUCOSE, CAPILLARY
Glucose-Capillary: 107 mg/dL — ABNORMAL HIGH (ref 65–99)
Glucose-Capillary: 148 mg/dL — ABNORMAL HIGH (ref 65–99)
Glucose-Capillary: 161 mg/dL — ABNORMAL HIGH (ref 65–99)
Glucose-Capillary: 112 mg/dL — ABNORMAL HIGH (ref 65–99)

## 2015-03-24 LAB — LACTIC ACID, PLASMA: Lactic Acid, Venous: 1 mmol/L (ref 0.5–2.0)

## 2015-03-24 MED ORDER — VITAMIN B-12 100 MCG PO TABS
100.0000 ug | ORAL_TABLET | Freq: Every day | ORAL | Status: DC
Start: 2015-03-24 — End: 2015-03-25
  Administered 2015-03-24 – 2015-03-25 (×2): 100 ug via ORAL
  Filled 2015-03-24 (×2): qty 1

## 2015-03-24 MED ORDER — ALBUTEROL SULFATE (2.5 MG/3ML) 0.083% IN NEBU: 2.5000 mg | INHALATION_SOLUTION | Freq: Four times a day (QID) | RESPIRATORY_TRACT | Status: DC | PRN

## 2015-03-24 MED ORDER — ENOXAPARIN SODIUM 40 MG/0.4ML ~~LOC~~ SOLN
40.0000 mg | SUBCUTANEOUS | Status: DC
  Administered 2015-03-24: 40 mg via SUBCUTANEOUS
  Filled 2015-03-24: qty 0.4

## 2015-03-24 MED ORDER — ASPIRIN 81 MG PO TBEC
81.0000 mg | DELAYED_RELEASE_TABLET | Freq: Every day | ORAL | Status: DC
  Administered 2015-03-24 – 2015-03-25 (×2): 81 mg via ORAL
  Filled 2015-03-24 (×3): qty 1

## 2015-03-24 MED ORDER — MOMETASONE FURO-FORMOTEROL FUM 100-5 MCG/ACT IN AERO
2.0000 | INHALATION_SPRAY | Freq: Two times a day (BID) | RESPIRATORY_TRACT | Status: DC
  Administered 2015-03-24 – 2015-03-25 (×3): 2 via RESPIRATORY_TRACT
  Filled 2015-03-24: qty 8.8

## 2015-03-24 MED ORDER — ALBUTEROL SULFATE HFA 108 (90 BASE) MCG/ACT IN AERS: 2.0000 | INHALATION_SPRAY | Freq: Four times a day (QID) | RESPIRATORY_TRACT | Status: DC | PRN

## 2015-03-24 NOTE — Progress Notes (Signed)
Called dr hower about pt heart rate in 40-50 bp good alert o2 sat good just going to watch at this time Tamsen Snider, Therapist, sports

## 2015-03-24 NOTE — Care Management Note (Signed)
Case Management Note  Patient Details  Name: Zachary Santos MRN: [id number] Date of Birth: 12/30/1935  Subjective/Objective: RNCM met with pt to discuss discharge planning. Patient states he lives at home with his wife. He reports he is independent with adls. Uses no assistive devices or home O2.  PCP is Lorane Gell at [facility]". He states he does fine at home and has no home health needs. Discussed his noncompliance with his medications and he reports he forgets approximately 1 x week.  Further explained the benefits of home health nursing and pt refused.                 Action/Plan:   Expected Discharge Date:                  Expected Discharge Plan:  Home/Self Care  In-House Referral:     Discharge planning Services  CM Consult  Post Acute Care Choice:    Choice offered to:  Patient  DME Arranged:    DME Agency:     HH Arranged:  Patient Refused [place] Agency:     Status of Service:  Completed, signed off  Medicare Important Message Given:  Yes Date Medicare IM Given:  03/24/15 Medicare IM give by:  Orvan July Date Additional Medicare IM Given:    Additional Medicare Important Message give by:     If discussed at [place] of Stay Meetings, dates discussed:    Additional Comments:  Jolly Mango, RN 03/24/2015, 2:18 PM

## 2015-03-24 NOTE — Progress Notes (Signed)
[place] for Vancomycin (day 2), Zosyn (day 2), and electrolytes.   Indication: R/O Sepsis, ICU status   No Known Allergies  Patient Measurements: Height: 6' (182.9 cm) Weight: 194 lb (87.998 kg) IBW/kg (Calculated) : 77.6 Adjusted Body Weight: 81kg.   Vital Signs: Temp: 97.8 F (36.6 C) (06/06 0700) Temp Source: Oral (06/06 0700) BP: 119/62 mmHg (06/06 1104) Pulse Rate: 65 (06/06 1104) Intake/Output from previous day: 06/05 0701 - 06/06 0700 In: 1866.5 [P.O.:420; I.V.:1000; IV Piggyback:446.5] Out: 1100 [Urine:1100] Intake/Output from this shift: Total I/O In: 120 [P.O.:120] Out: 301 [Urine:300; Stool:1] Vent settings for last 24 hours:    Labs:  Recent Labs  03/23/15 1450 03/24/15 0602  WBC 8.7 7.5  HGB 13.9 12.8*  HCT 41.9 38.1*  PLT 184 147*  CREATININE 2.14* 1.67*  ALBUMIN 4.3  --   PROT 7.1  --   AST 23  --   ALT 18  --   ALKPHOS 236*  --   BILITOT 0.8  --    Estimated Creatinine Clearance: 41.3 mL/min (by C-G formula based on Cr of 1.67).   Recent Labs  03/23/15 2046 03/24/15 0742 03/24/15 1131  GLUCAP 131* 107* 148*    Microbiology: Recent Results (from the past 720 hour(s))  Blood Culture (routine x 2)     Status: None (Preliminary result)   Collection Time: 03/23/15  2:46 PM  Result Value Ref Range Status   Specimen Description BLOOD  Final   Special Requests NONE  Final   Culture NO GROWTH < 24 HOURS  Final   Report Status PENDING  Incomplete  Blood Culture (routine x 2)     Status: None (Preliminary result)   Collection Time: 03/23/15  2:47 PM  Result Value Ref Range Status   Specimen Description BLOOD  Final   Special Requests NONE  Final   Culture NO GROWTH < 24 HOURS  Final   Report Status PENDING  Incomplete  MRSA PCR Screening     Status: None   Collection Time: 03/23/15  6:34 PM  Result Value Ref Range Status   MRSA by PCR NEGATIVE NEGATIVE Final    Comment:        The  GeneXpert MRSA Assay (FDA approved for NASAL specimens only), is one component of a comprehensive MRSA colonization surveillance program. It is not intended to diagnose MRSA infection nor to guide or monitor treatment for MRSA infections.     Medications:  Scheduled:  . aspirin  81 mg Oral Daily  . heparin  5,000 Units Subcutaneous 3 times per day  . insulin aspart  0-5 Units Subcutaneous QHS  . insulin aspart  0-9 Units Subcutaneous TID WC  . mometasone-formoterol  2 puff Inhalation BID  . nicotine  21 mg Transdermal Daily  . piperacillin-tazobactam (ZOSYN)  IV  3.375 g Intravenous 3 times per day  . vancomycin  1,250 mg Intravenous Q24H  . vitamin B-12  100 mcg Oral Daily   Infusions:  . sodium chloride 100 mL/hr at 03/24/15 0615   PRN: acetaminophen **OR** acetaminophen, albuterol, alum & mag hydroxide-simeth, ondansetron **OR** ondansetron (ZOFRAN) IV, oxyCODONE, senna-docusate  Assessment: 79 yo male w/ hx of HTN, DM, BPH, COPD, Paget's disease who presented to the hospital due to weakness, hypotension, hypothermia.    Plan:   1. Antibiotics: Will continue patient on Zosyn EI 3.375g IV Q8hr and Vancomycin 1250mg  IV Q24hr for goal trough of 15-20. Patient remains afebrile, off pressors,  has normalized WBC, and cultures remain negative. Per MD note, plan is to narrow abx on 6/7 if cultures remain negative and patient remaines stable.    2. Electrolytes: Patient received 65mEq of PO potassium on 6/5. Electrolytes now WNL. Will add magnesium to am labs from 6/6. Will continue to monitor and obtain follow-up labs with am labs.    3. COPD: Patient with COPD requiring Advair as an outpatient; is patient candidate for Spiriva?    4. DVT Prophylaxis: Patient has improving renal function and meets criteria for enoxparin 40mg  SQ q24hr. Please consider transitioning patient to enoxaparin from current heparin 5000 units Q8hr for decreased side effects and patient comfort.     Pharmacy will continue to monitor and adjust per consult.    Markie Heffernan L 03/24/2015,11:45 AM

## 2015-03-24 NOTE — Consult Note (Signed)
CARDIOLOGY CONSULT NOTE   Patient ID: JAEVION GOTO MRN: [id number], DOB/AGE: 77-Nov-1937   Admit date: 03/23/2015 Date of Consult: 03/24/2015   Primary Physician: Rubbie Battiest, NP Primary Cardiologist: new - seen by M. Fletcher Anon, MD   Pt. Profile  79 y/o male w/o prior cardiac hx, who presented to the [facility] ED yesterday 2/2 hypotension and syncope.  Problem List  Past Medical History  Diagnosis Date  . Chronic fatigue   . Stable angina   . COPD (chronic obstructive pulmonary disease)   . Essential hypertension     a. 01/2008 Echo: EF 65%, no rwma, mod increased PASP.  [name] CKD (chronic kidney disease), stage III   . Paget's disease   . Tobacco abuse     a. 1-1.5 ppd x 60+ yrs.  . Osteoarthritis   . Type II diabetes mellitus   . Syncope     a. 03/2015 in setting of hypotension.  . Claudication     a. R Hip.    Past Surgical History  Procedure Laterality Date  . Spine surgery      cervical spine surgery  . Appendectomy    . Laparscopic chole    . Trigeminal neuralgia      with surgery in [place]     Allergies  No Known Allergies  HPI   79 y/o male with a prior h/o HTN, DM (noncompliant with outpt meds), and heavy and prolonged tobacco abuse.  He says that he can't remember the time before he smoked cigarettes.  He lives locally with his wife in [place] and admits to being very sedentary because activity causes him to "give out."  He thinks that he can walk about 57 yds on a flat surface prior to developing DOE and right hip/buttock burning, which would cause him to stop and rest.  He also says that heavier levels of exertion, like walking up inclines, would result in substernal chest pressure/burning, usually lasting while he is exerting, and resolving within a few mins of resting.  He's had these symptoms for several years and believes he had a nl stress test some years ago @ [facility].  On 6/4, he moved a refrigerator with a nephew while using a hand cart.  He says that  that was much more exertion than he is typically accustomed to but was surprised by how tired and dyspneic he was.  It was very hot outside and he says that other than eating some barbecue and drinking soda around dinner time, he didn't really eat and drink that day.  On 6/5, sometime around 1 pm, he was sitting at his computer when he looked at the screen and noted it to be fairly blurry.  With that, he felt acutely lightheaded.  He checked his BP with a cuff that was nearby and it recorded a BP of 77/52.  Following that, he lost consciousness and his head fell forward and struck the computer keyboard.  This was witnessed by a young family member and his wife was alerted.  EMS was called.  Pt regained consciousness w/in about 30 seconds and upon doing so, became nauseated and markedly diaphoretic.  He initially had dry heaves but later developed vomiting and also was unable to control his bowels.  Following that, his wife assisted him to the shower.  He stood in the shower and continued to feel lightheaded, though did not have any recurrence of syncope.  Following EMS arrival, he was transported to [facility].  He  had several more vomiting episodes en route.  Once here, he was found to be hypotensive (82/50) and hypothermic (95 F).  IVF were started, a bear hugger was placed, and broad spectrum abx were initiated 2/2 concern for sepsis.  UA neg for UTI.  CXR w/o acute dzs.  WBC nl.  Afebrile since admission.  Creat was up @ 2.14 on admission (prev baseline appears to be 1.4-1.7).  With hydration, this has improved to 1.67.  We have been asked to eval.  Overall, he has been feeling better, though was lightheaded upon sitting up this AM.  His outpt antihypertensives have been on hold and BP's have ben trending in the 110's to 120's this morning.  Inpatient Medications  . aspirin  81 mg Oral Daily  . heparin  5,000 Units Subcutaneous 3 times per day  . insulin aspart  0-5 Units Subcutaneous QHS  . insulin aspart  0-9  Units Subcutaneous TID WC  . mometasone-formoterol  2 puff Inhalation BID  . nicotine  21 mg Transdermal Daily  . piperacillin-tazobactam (ZOSYN)  IV  3.375 g Intravenous 3 times per day  . vancomycin  1,250 mg Intravenous Q24H  . vitamin B-12  100 mcg Oral Daily    Family History Family History  Problem Relation Age of Onset  . Cancer Mother     died @ 50.  [name] COPD Father     died @ 41.  . Diabetes Brother     died in early 3's.     Social History History   Social History  . Marital Status: Married    Spouse Name: N/A  . Number of Children: N/A  . Years of Education: N/A   Occupational History  . Not on file.   Social History Main Topics  . Smoking status: Current Every Day Smoker -- 1.50 packs/day for 60 years    Types: Cigarettes  . Smokeless tobacco: Current User  . Alcohol Use: 0.0 oz/week    0 Standard drinks or equivalent per week     Comment: rare - "1% of the time."  . Drug Use: No  . Sexual Activity: Not on file   Other Topics Concern  . Not on file   Social History Narrative   Lives in [place] with wife.  Retired Administrator.  Very sedentary.  Avoids activity b/c he "gives out" and is limited by right hip/buttock discomfort/claudication.     Review of Systems  General:  Always feels cold.  No fever, night sweats or weight changes.  Cardiovascular:  +++ exertional chest pressurea, +++ dyspnea on exertion, occasional LE edema, no orthopnea, palpitations, paroxysmal nocturnal dyspnea.  Per wife, he does snore. Dermatological: No rash, lesions/masses Respiratory: No cough, +++ dyspnea Urologic: No hematuria, dysuria Abdominal:   +++ nausea, +++vomiting in setting of presyncope/syncope yesterday.  Occasional constipation/diarrhea, no bright red blood per rectum, melena, or hematemesis Neurologic:  No visual changes, +++ wkns.  He has a h/o acute altered mental status changes where he has trouble understanding and responding to speech.  This has  occurred on multiple occasions over the years, usually lasting 20-30 mins and resolving spontaneously.  He has never undergone neuro eval for these events. All other systems reviewed and are otherwise negative except as noted above.  Physical Exam  Blood pressure 129/74, pulse 52, temperature 97.8 F (36.6 C), temperature source Oral, resp. rate 12, height 6' (1.829 m), weight 194 lb (87.998 kg), SpO2 95 %.  General: Pleasant, NAD Psych: Normal  affect. Neuro: Alert and oriented X 3. Moves all extremities spontaneously. HEENT: Normal  Neck: Supple without JVD. Soft bilat bruits. Lungs:  Resp regular and unlabored, somewhat diminished but otw clear to auscultation. Heart: very distant heart sounds.  RRR no s3, s4, or murmurs. Abdomen: Soft, non-tender, non-distended, BS + x 4.  Extremities: No clubbing, cyanosis or edema. DP/PT/Radials 1+ and equal bilaterally.  Labs   Recent Labs  03/23/15 1450 03/23/15 2322 03/24/15 0602  TROPONINI <0.03 <0.03 <0.03   Lab Results  Component Value Date   WBC 7.5 03/24/2015   HGB 12.8* 03/24/2015   HCT 38.1* 03/24/2015   MCV 90.8 03/24/2015   PLT 147* 03/24/2015     Recent Labs Lab 03/23/15 1450 03/24/15 0602  NA 136 139  K 3.1* 4.5  CL 104 109  CO2 20* 24  BUN 30* 20  CREATININE 2.14* 1.67*  CALCIUM 8.9 8.7*  PROT 7.1  --   BILITOT 0.8  --   ALKPHOS 236*  --   ALT 18  --   AST 23  --   GLUCOSE 246* 113*   Radiology/Studies  Dg Abd 1 View  03/23/2015   CLINICAL DATA:  Nausea and weakness today.  EXAM: ABDOMEN - 1 VIEW  COMPARISON:  None.  FINDINGS: Relative paucity of bowel gas. There are few scattered small bowel loops with air but no distention. Could not exclude fluid-filled loops of small bowel. The soft tissue shadows are grossly maintained. No worrisome calcifications are seen. The bony structures are unremarkable.  IMPRESSION: Unremarkable abdominal radiograph. No findings for obstruction or perforation.   Electronically  Signed   By: Marijo Sanes M.D.   On: 03/23/2015 16:20   Dg Chest Port 1 View  03/23/2015   CLINICAL DATA:  Nausea.  Weakness.  COPD.  EXAM: PORTABLE CHEST - 1 VIEW  COMPARISON:  08/05/2014  FINDINGS: The heart size and mediastinal contours are within normal limits. Both lungs are clear. The visualized skeletal structures are unremarkable.  IMPRESSION: Stable exam.  No active disease.   Electronically Signed   By: Earle Gell M.D.   On: 03/23/2015 15:07   ECG  Sinus rhythm, 64, pac, inf q's - no acute st/t changes.  ASSESSMENT AND PLAN  1. Syncope:  In setting of hypotension documented on home BP cuff.  He dos not know what HR was.  He says that he was more active than usual on the day prior to admission and was not eating and drinking as he should have.  Labs suggest dehydration w/ admission  BUN/Creat of 30/2.14.  He also takes amlodipine 10, lisinopril-HCTZ 10-12.5, and doxazosin 4 daily.  Symptoms of diaphoresis, n/v, GI upset also suggestive of possible vasovagal episode.  ED in ECG w/ stable HR of 64 w/o evidence of heart block during Ss, thus doubt primary arrhythmogenic syncope.  Tele shows intermittent HR's in the 40's, especially during hours of sleep (has a h/o snoring), but again, no evidence of heart block of any type, and no prolonged pauses.  Await 2D echo.  Pressures better with hydration and holding home BP meds.  Abx per IM for possible sepsis (UA/CXR/WBC/Lactate WNL, hypothermia now resolved).  2.  Hypotension:  See #1.  Improving w/ hydration and holding home BP meds.  3.  Acute on chronic stage III kidney disease:  Improving w/ hydration.  Lisinopril/HCTZ on hold.  4.  DM II: Noncompliant with glipizide @ home.  Sugars elevated on admission.  Stressed importance of compliance.  5.  Tob Abuse/COPD:  Complete cessation advised.  He is not interested in quitting.  6.  Stable Angina:  He reports a relatively long h/o DOE and exertional substernal c/p.  He had a stress test @ [facility]  some time ago.  He has multiple RF for coronary disease and would be willing to undergo outpt lexiscan Cardiolite.  Cont ASA. F/U lipids w/ low threshold to add statin given h/o DM and HTN (though LDL 78 in 2015).  7.  R buttock/hip claudication:  Limits desire to ambulate and typically develops after walking ~ 75 yds on a flat surface.  Would benefit from outpt ABI's.  Cont ASA.     Signed, Murray Hodgkins, NP 03/24/2015, 12:48 PM

## 2015-03-24 NOTE — Progress Notes (Signed)
[facility] NAME: Zachary Santos    MR#:  [id number]  DATE OF BIRTH:  July 07, 1936  SUBJECTIVE:  CHIEF COMPLAINT:   Chief Complaint  Patient presents with  . Weakness    weakness and nausea   Pt. Here due to syncope and also noted to be hypotensive, weak & Hypothermic.  Feels much better today. Hypotension resolved.    REVIEW OF SYSTEMS:    Review of Systems  Constitutional: Negative for fever and chills.  HENT: Negative for congestion and tinnitus.   Eyes: Negative for blurred vision and double vision.  Respiratory: Negative for cough, shortness of breath and wheezing.   Cardiovascular: Negative for chest pain, orthopnea and PND.  Gastrointestinal: Negative for nausea, vomiting, abdominal pain and diarrhea.  Genitourinary: Negative for dysuria and hematuria.  Skin: Negative for rash.  Neurological: Positive for weakness (generalized). Negative for dizziness, sensory change and focal weakness.  All other systems reviewed and are negative.   Nutrition: cardiac diet.  Tolerating Diet: yes  DRUG ALLERGIES:  No Known Allergies  VITALS:  Blood pressure 119/62, pulse 65, temperature 97.8 F (36.6 C), temperature source Oral, resp. rate 13, height 6' (1.829 m), weight 87.998 kg (194 lb), SpO2 92 %.  PHYSICAL EXAMINATION:   Physical Exam  GENERAL:  79 y.o.-year-old patient lying in the bed with no acute distress.  EYES: Pupils equal, round, reactive to light and accommodation. No scleral icterus. Extraocular muscles intact.  HEENT: Head atraumatic, normocephalic. Oropharynx and nasopharynx clear.  NECK:  Supple, no jugular venous distention. No thyroid enlargement, no tenderness.  LUNGS: Normal breath sounds bilaterally, no wheezing, rales, rhonchi. No use of accessory muscles of respiration.  CARDIOVASCULAR: S1, S2 normal. No murmurs, rubs, or gallops.  ABDOMEN: Soft, nontender, nondistended. Bowel sounds present. No  organomegaly or mass.  EXTREMITIES: No cyanosis, clubbing or edema b/l.    NEUROLOGIC: Cranial nerves II through XII are intact. No focal Motor or sensory deficits b/l.   PSYCHIATRIC: The patient is alert and oriented x 3. Good affect.  SKIN: No obvious rash, lesion, or ulcer.    LABORATORY PANEL:   CBC  Recent Labs Lab 03/24/15 0602  WBC 7.5  HGB 12.8*  HCT 38.1*  PLT 147*   ------------------------------------------------------------------------------------------------------------------  Chemistries   Recent Labs Lab 03/23/15 1450 03/24/15 0602  NA 136 139  K 3.1* 4.5  CL 104 109  CO2 20* 24  GLUCOSE 246* 113*  BUN 30* 20  CREATININE 2.14* 1.67*  CALCIUM 8.9 8.7*  AST 23  --   ALT 18  --   ALKPHOS 236*  --   BILITOT 0.8  --    ------------------------------------------------------------------------------------------------------------------  Cardiac Enzymes  Recent Labs Lab 03/24/15 0602  TROPONINI <0.03   ------------------------------------------------------------------------------------------------------------------  RADIOLOGY:  Dg Abd 1 View  03/23/2015   CLINICAL DATA:  Nausea and weakness today.  EXAM: ABDOMEN - 1 VIEW  COMPARISON:  None.  FINDINGS: Relative paucity of bowel gas. There are few scattered small bowel loops with air but no distention. Could not exclude fluid-filled loops of small bowel. The soft tissue shadows are grossly maintained. No worrisome calcifications are seen. The bony structures are unremarkable.  IMPRESSION: Unremarkable abdominal radiograph. No findings for obstruction or perforation.   Electronically Signed   By: Marijo Sanes M.D.   On: 03/23/2015 16:20   Dg Chest Port 1 View  03/23/2015   CLINICAL DATA:  Nausea.  Weakness.  COPD.  EXAM: PORTABLE  CHEST - 1 VIEW  COMPARISON:  08/05/2014  FINDINGS: The heart size and mediastinal contours are within normal limits. Both lungs are clear. The visualized skeletal structures are  unremarkable.  IMPRESSION: Stable exam.  No active disease.   Electronically Signed   By: Earle Gell M.D.   On: 03/23/2015 15:07     ASSESSMENT AND PLAN:   79 yo male w/ hx of HTN, DM, BPH, COPD, Paget's disease who presented to the hospital due to weakness, hypotension, hypothermia.   1. Sepsis - suspected diagnosis as pt. Presented w/ hypotension, hypothermia.   - no clear source noted.  CXR (-), UA (-).   - on IV Vanco, Zosyn.  Blood, urine cultures (-) so far.  - hemodynamically stable, afebrile.  - will narrow abx in a.m. Tomorrow if clinically stable and cultures (-).   2. Syncope - had an episode at home and was unresponsive for 30 sec.   - on tele no alarms.  Will get Cards consult, await 2-D echo.   - will check orthostatics.  CE X 3 have been (-).   3. COPD - no acute exacerbation.  - cont. Dulera, albuterol  4. Tobacco abuse - cont. Nicotine patch.   5. Acute Renal failure - likely ATN from hypotension and suspected sepsis.  - improved w/ IV fluids.   6. HTN - hold anti-HTN meds as pt. Was hypotensive on admission.     All the records are reviewed and case discussed with Care Management/Social Workerr. Management plans discussed with the patient, family and they are in agreement.  CODE STATUS: DNR  DVT Prophylaxis: Heparin Sq  TOTAL TIME TAKING CARE OF THIS PATIENT: 30 minutes.   POSSIBLE D/C IN 1-2 DAYS, DEPENDING ON CLINICAL CONDITION.   Henreitta Leber M.D on 03/24/2015 at 11:11 AM  Between 7am to 6pm - Pager - [phone]  After 6pm go to [email or web address] - password [email or web address] [facility]  Office  [phone]  CC: Primary care physician; Rubbie Battiest, NP

## 2015-03-25 ENCOUNTER — Inpatient Hospital Stay (HOSPITAL_COMMUNITY)
Admit: 2015-03-25 | Discharge: 2015-03-25 | Disposition: A | Discharge status: Home / Self Care | Payer: Medicare Other | Attending: Internal Medicine | Admitting: Internal Medicine

## 2015-03-25 ENCOUNTER — Telehealth: Payer: Self-pay | Admitting: Nurse Practitioner

## 2015-03-25 DIAGNOSIS — R55 Syncope and collapse: Secondary | ICD-10-CM

## 2015-03-25 LAB — GLUCOSE, CAPILLARY
Glucose-Capillary: 121 mg/dL — ABNORMAL HIGH (ref 65–99)
Glucose-Capillary: 125 mg/dL — ABNORMAL HIGH (ref 65–99)

## 2015-03-25 LAB — BASIC METABOLIC PANEL
Anion gap: 6 (ref 5–15)
BUN: 16 mg/dL (ref 6–20)
CO2: 24 mmol/L (ref 22–32)
Calcium: 8.6 mg/dL — ABNORMAL LOW (ref 8.9–10.3)
Chloride: 108 mmol/L (ref 101–111)
Creatinine, Ser: 1.56 mg/dL — ABNORMAL HIGH (ref 0.61–1.24)
GFR calc Af Amer: 48 mL/min — ABNORMAL LOW (ref >60)
GFR calc non Af Amer: 41 mL/min — ABNORMAL LOW (ref >60)
Glucose, Bld: 111 mg/dL — ABNORMAL HIGH (ref 65–99)
Potassium: 3.8 mmol/L (ref 3.5–5.1)
Sodium: 138 mmol/L (ref 135–145)

## 2015-03-25 LAB — URINE CULTURE: Culture: NO GROWTH

## 2015-03-25 LAB — MAGNESIUM: Magnesium: 1.7 mg/dL (ref 1.7–2.4)

## 2015-03-25 LAB — PHOSPHORUS: Phosphorus: 2.9 mg/dL (ref 2.5–4.6)

## 2015-03-25 MED ORDER — LEVOFLOXACIN 500 MG PO TABS: 500.0000 mg | ORAL_TABLET | Freq: Every day | ORAL | Status: DC

## 2015-03-25 MED ORDER — GUAIFENESIN 100 MG/5ML PO SYRP
200.0000 mg | ORAL_SOLUTION | ORAL | Status: DC | PRN
  Filled 2015-03-25: qty 10

## 2015-03-25 NOTE — Discharge Summary (Signed)
[facility] NAME: Zachary Santos    MR#:  [id number]  DATE OF BIRTH:  [birth year]  DATE OF ADMISSION:  03/23/2015 ADMITTING PHYSICIAN: Bettey Costa, MD  DATE OF DISCHARGE: 03/25/2015  2:49 PM  PRIMARY CARE PHYSICIAN: Rubbie Battiest, NP    ADMISSION DIAGNOSIS:  Vomiting and diarrhea [R11.10, R19.7] Acute kidney injury [N17.9] Sepsis, due to unspecified organism [A41.9]  DISCHARGE DIAGNOSIS:  Active Problems:   TOBACCO ABUSE   COPD (chronic obstructive pulmonary disease)   BPH (benign prostatic hyperplasia)   Diabetes type 2, controlled   Sepsis   Syncope   CKD (chronic kidney disease), stage III   Essential hypertension   Hypotension   Hypothermia   SECONDARY DIAGNOSIS:   Past Medical History  Diagnosis Date  . Chronic fatigue   . Stable angina   . COPD (chronic obstructive pulmonary disease)   . Essential hypertension     a. 01/2008 Echo: EF 65%, no rwma, mod increased PASP.  [name] CKD (chronic kidney disease), stage III   . Paget's disease   . Tobacco abuse     a. 1-1.5 ppd x 60+ yrs.  . Osteoarthritis   . Type II diabetes mellitus   . Syncope     a. 03/2015 in setting of hypotension.  . Claudication     a. R Hip.    HOSPITAL COURSE:   79 year old male with past medical history of COPD, hypertension, chronic kidney disease stage III, history of Paget's disease, osteoarthritis, type 2 diabetes, who presented to the hospital due to hypotension hypothermia and suspicion for clinical sepsis.  #1 sepsis-this was a suspected diagnosis on admission given the patient's hypothermia and hypotension.  Initially patient was admitted to the intensive care unit and started on broad-spectrum IV antibiotics with vancomycin and Zosyn. -There was no clear source as patient's chest x-ray and urinalysis were negative on admission. -Patient has been hemodynamically stable after getting some IV fluids and broad-spectrum IV antibiotics  as cultures remained negative and is clinically asymptomatic now. He is empirically being discharged on oral Levaquin for a few days.  #2 syncope-patient had an episode at home when he was unresponsive for about 30 seconds. -His cardiac markers have been negative in the hospital, he's had no evidence of arrhythmia on telemetry, his orthostatics have been negative. -She had a two-dimensional echocardiogram which showed normal ejection fraction. He was seen by cardiology who thought his syncope was likely vasovagal in nature and is clinically feeling better and therefore being discharged home.  #3 COPD he had no acute exacerbation patient will continue his Dulera and albuterol inhaler as needed.  #4 tobacco abuse-patient was maintained on nicotine patch.  #5 acute renal failure-this was acute on chronic renal failure and likely due to hypotension and suspected sepsis. This improved and resolved with IV fluid hydration.  #6 hypertension patient's antihypertensives were held during the hospital although since his hemodynamics have improved he can resume those upon discharge.  DISCHARGE CONDITIONS:   Stable  CONSULTS OBTAINED:  Treatment Team:  Wellington Hampshire, MD  DRUG ALLERGIES:  No Known Allergies  DISCHARGE MEDICATIONS:   Discharge Medication List as of 03/25/2015  2:20 PM    START taking these medications   Details  levofloxacin (LEVAQUIN) 500 MG tablet Take 1 tablet (500 mg total) by mouth daily., Starting 03/25/2015, Until Discontinued, Print      CONTINUE these medications which have NOT CHANGED   Details  amLODipine (  NORVASC) 10 MG tablet Take 1 tablet (10 mg total) by mouth daily., Starting 04/18/2014, Until Discontinued, Normal    aspirin 81 MG EC tablet Take 1 tablet (81 mg total) by mouth daily., Starting 04/18/2014, Until Discontinued, Print    Cyanocobalamin (VITAMIN B12 PO) Take 1 capsule by mouth daily., Until Discontinued, Historical Med    doxazosin (CARDURA) 2 MG  tablet TAKE TWO TABLETS BY MOUTH AT BEDTIME, Normal    glipiZIDE (GLUCOTROL XL) 2.5 MG 24 hr tablet Take 1 tablet (2.5 mg total) by mouth daily with breakfast., Starting 02/14/2015, Until Discontinued, Normal    lisinopril-hydrochlorothiazide (PRINZIDE) 10-12.5 MG per tablet Take 1 tablet by mouth daily., Starting 04/18/2014, Until Fri 04/18/15, Normal    triamcinolone (KENALOG) 0.025 % ointment Apply topically 4 (four) times daily as needed., Starting 04/18/2014, Until Discontinued, Normal    albuterol (PROVENTIL HFA;VENTOLIN HFA) 108 (90 BASE) MCG/ACT inhaler Inhale 2 puffs into the lungs every 6 (six) hours as needed for wheezing., Starting 04/18/2014, Until Discontinued, Normal    Fluticasone-Salmeterol (ADVAIR) 250-50 MCG/DOSE AEPB Inhale 1 puff into the lungs every 12 (twelve) hours., Starting 04/18/2014, Until Discontinued, Normal         DISCHARGE INSTRUCTIONS:   DIET:  Cardiac diet and Diabetic diet  DISCHARGE CONDITION:  Stable  ACTIVITY:  Activity as tolerated  OXYGEN:  Home Oxygen: No.   Oxygen Delivery: room air  DISCHARGE LOCATION:  home   If you experience worsening of your admission symptoms, develop shortness of breath, life threatening emergency, suicidal or homicidal thoughts you must seek medical attention immediately by calling 911 or calling your MD immediately  if symptoms less severe.  You Must read complete instructions/literature along with all the possible adverse reactions/side effects for all the Medicines you take and that have been prescribed to you. Take any new Medicines after you have completely understood and accpet all the possible adverse reactions/side effects.   Please note  You were cared for by a hospitalist during your hospital stay. If you have any questions about your discharge medications or the care you received while you were in the hospital after you are discharged, you can call the unit and asked to speak with the hospitalist on call if  the hospitalist that took care of you is not available. Once you are discharged, your primary care physician will handle any further medical issues. Please note that NO REFILLS for any discharge medications will be authorized once you are discharged, as it is imperative that you return to your primary care physician (or establish a relationship with a primary care physician if you do not have one) for your aftercare needs so that they can reassess your need for medications and monitor your lab values.     Today    VITAL SIGNS:  Blood pressure 127/62, pulse 63, temperature 98.2 F (36.8 C), temperature source Oral, resp. rate 17, height 6' (1.829 m), weight 90.402 kg (199 lb 4.8 oz), SpO2 96 %.  I/O:   Intake/Output Summary (Last 24 hours) at 03/25/15 1631 Last data filed at 03/25/15 1115  Gross per 24 hour  Intake    740 ml  Output    500 ml  Net    240 ml    PHYSICAL EXAMINATION:  GENERAL:  79 y.o.-year-old patient lying in the bed with no acute distress.  EYES: Pupils equal, round, reactive to light and accommodation. No scleral icterus. Extraocular muscles intact.  HEENT: Head atraumatic, normocephalic. Oropharynx and nasopharynx clear.  NECK:  Supple, no jugular venous distention. No thyroid enlargement, no tenderness.  LUNGS: Normal breath sounds bilaterally, no wheezing, rales,rhonchi or crepitation. No use of accessory muscles of respiration.  CARDIOVASCULAR: S1, S2 normal. No murmurs, rubs, or gallops.  ABDOMEN: Soft, non-tender, non-distended. Bowel sounds present. No organomegaly or mass.  EXTREMITIES: No pedal edema, cyanosis, or clubbing.  NEUROLOGIC: Cranial nerves II through XII are intact. No focal motor or sensory defecits b/l.  PSYCHIATRIC: The patient is alert and oriented x 3. Good affect.  SKIN: No obvious rash, lesion, or ulcer.   DATA REVIEW:   CBC  Recent Labs Lab 03/24/15 0602  WBC 7.5  HGB 12.8*  HCT 38.1*  PLT 147*    Chemistries   Recent  Labs Lab 03/23/15 1450  03/25/15 0427  NA 136  < > 138  K 3.1*  < > 3.8  CL 104  < > 108  CO2 20*  < > 24  GLUCOSE 246*  < > 111*  BUN 30*  < > 16  CREATININE 2.14*  < > 1.56*  CALCIUM 8.9  < > 8.6*  MG  --   < > 1.7  AST 23  --   --   ALT 18  --   --   ALKPHOS 236*  --   --   BILITOT 0.8  --   --   < > = values in this interval not displayed.  Cardiac Enzymes  Recent Labs Lab 03/24/15 0602  TROPONINI <0.03    Microbiology Results  Results for orders placed or performed during the hospital encounter of 03/23/15  Blood Culture (routine x 2)     Status: None (Preliminary result)   Collection Time: 03/23/15  2:46 PM  Result Value Ref Range Status   Specimen Description BLOOD  Final   Special Requests NONE  Final   Culture NO GROWTH 2 DAYS  Final   Report Status PENDING  Incomplete  Blood Culture (routine x 2)     Status: None (Preliminary result)   Collection Time: 03/23/15  2:47 PM  Result Value Ref Range Status   Specimen Description BLOOD  Final   Special Requests NONE  Final   Culture NO GROWTH 2 DAYS  Final   Report Status PENDING  Incomplete  Urine culture     Status: None   Collection Time: 03/23/15  2:47 PM  Result Value Ref Range Status   Specimen Description URINE, RANDOM  Final   Special Requests NONE  Final   Culture NO GROWTH 2 DAYS  Final   Report Status 03/25/2015 FINAL  Final  MRSA PCR Screening     Status: None   Collection Time: 03/23/15  6:34 PM  Result Value Ref Range Status   MRSA by PCR NEGATIVE NEGATIVE Final    Comment:        The GeneXpert MRSA Assay (FDA approved for NASAL specimens only), is one component of a comprehensive MRSA colonization surveillance program. It is not intended to diagnose MRSA infection nor to guide or monitor treatment for MRSA infections.     RADIOLOGY:  No results found.    Management plans discussed with the patient, family and they are in agreement.  CODE STATUS:     Code Status Orders         Start     Ordered   03/23/15 1838  Do not attempt resuscitation (DNR)   Continuous    Question Answer Comment  In the event of cardiac or  respiratory ARREST Do not call a "code blue"   In the event of cardiac or respiratory ARREST Do not perform Intubation, CPR, defibrillation or ACLS   In the event of cardiac or respiratory ARREST Use medication by any route, position, wound care, and other measures to relive pain and suffering. May use oxygen, suction and manual treatment of airway obstruction as needed for comfort.      03/23/15 1837      TOTAL TIME TAKING CARE OF THIS PATIENT: 40 minutes.    Henreitta Leber M.D on 03/25/2015 at 4:31 PM  Between 7am to 6pm - Pager - [phone]  After 6pm go to [email or web address] - password [email or web address] [facility]  Office  [phone]  CC: Primary care physician; Rubbie Battiest, NP

## 2015-03-25 NOTE — Discharge Instructions (Signed)

## 2015-03-25 NOTE — Progress Notes (Signed)
PHARMACY - Consult F/U  Pharmacy Consult for Vancomycin (day 3), Zosyn (day 3), and electrolytes.   Indication: R/O Sepsis   No Known Allergies  Patient Measurements: Height: 6' (182.9 cm) Weight: 199 lb 4.8 oz (90.402 kg) IBW/kg (Calculated) : 77.6 Adjusted Body Weight: 82.7kg.   Vital Signs: Temp: 98.2 F (36.8 C) (06/07 0851) Temp Source: Oral (06/07 0851) BP: 127/62 mmHg (06/07 0851) Pulse Rate: 63 (06/07 0851) Intake/Output from previous day: 06/06 0701 - 06/07 0700 In: 920 [P.O.:120; I.V.:800] Out: 1361 [Urine:1360; Stool:1] Intake/Output from this shift: Total I/O In: 240 [P.O.:240] Out: 0  Vent settings for last 24 hours:    Labs:  Recent Labs  03/23/15 1450 03/24/15 0602 03/25/15 0427  WBC 8.7 7.5  --   HGB 13.9 12.8*  --   HCT 41.9 38.1*  --   PLT 184 147*  --   CREATININE 2.14* 1.67* 1.56*  MG  --  1.8 1.7  PHOS  --   --  2.9  ALBUMIN 4.3  --   --   PROT 7.1  --   --   AST 23  --   --   ALT 18  --   --   ALKPHOS 236*  --   --   BILITOT 0.8  --   --    Estimated Creatinine Clearance: 44.2 mL/min (by C-G formula based on Cr of 1.56).   Recent Labs  03/24/15 1715 03/24/15 2043 03/25/15 0925  GLUCAP 161* 112* 121*    Microbiology: Recent Results (from the past 720 hour(s))  Blood Culture (routine x 2)     Status: None (Preliminary result)   Collection Time: 03/23/15  2:46 PM  Result Value Ref Range Status   Specimen Description BLOOD  Final   Special Requests NONE  Final   Culture NO GROWTH 2 DAYS  Final   Report Status PENDING  Incomplete  Blood Culture (routine x 2)     Status: None (Preliminary result)   Collection Time: 03/23/15  2:47 PM  Result Value Ref Range Status   Specimen Description BLOOD  Final   Special Requests NONE  Final   Culture NO GROWTH 2 DAYS  Final   Report Status PENDING  Incomplete  Urine culture     Status: None   Collection Time: 03/23/15  2:47 PM  Result Value Ref Range Status   Specimen Description  URINE, RANDOM  Final   Special Requests NONE  Final   Culture NO GROWTH 2 DAYS  Final   Report Status 03/25/2015 FINAL  Final  MRSA PCR Screening     Status: None   Collection Time: 03/23/15  6:34 PM  Result Value Ref Range Status   MRSA by PCR NEGATIVE NEGATIVE Final    Comment:        The GeneXpert MRSA Assay (FDA approved for NASAL specimens only), is one component of a comprehensive MRSA colonization surveillance program. It is not intended to diagnose MRSA infection nor to guide or monitor treatment for MRSA infections.     Medications:  Scheduled:  . aspirin  81 mg Oral Daily  . enoxaparin (LOVENOX) injection  40 mg Subcutaneous Q24H  . insulin aspart  0-5 Units Subcutaneous QHS  . insulin aspart  0-9 Units Subcutaneous TID WC  . mometasone-formoterol  2 puff Inhalation BID  . nicotine  21 mg Transdermal Daily  . piperacillin-tazobactam (ZOSYN)  IV  3.375 g Intravenous 3 times per day  . vancomycin  1,250 mg  Intravenous Q24H  . vitamin B-12  100 mcg Oral Daily   Infusions:  . sodium chloride 100 mL/hr at 03/25/15 0647   PRN: acetaminophen **OR** acetaminophen, albuterol, alum & mag hydroxide-simeth, guaifenesin, ondansetron **OR** ondansetron (ZOFRAN) IV, oxyCODONE, senna-docusate  Assessment: 79 yo male w/ hx of HTN, DM, BPH, COPD, Paget's disease who presented to the hospital due to weakness, hypotension, hypothermia.    Plan:   1. Antibiotics: Will continue patient on Zosyn EI 3.375g IV Q8hr and Vancomycin 1250mg  IV Q24hr for goal trough of 15-20. Patient remains afebrile,has normalized WBC, and cultures remain negative. Per MD note, plan is to narrow abx on 6/7 if cultures remain negative and patient remains stable.  Trough ordered for 6/8 at 2100.  2. Electrolytes: Electrolytes now WNL. Will continue to monitor and obtain follow-up labs with am labs.  .    Pharmacy will continue to monitor and adjust per consult.    Murrell Converse, PharmD Clinical  Pharmacist 03/25/2015

## 2015-03-25 NOTE — Progress Notes (Signed)
Pt alert and oriented. Pt given discharge summary. IV sites removed. Concerns addressed.

## 2015-03-25 NOTE — Telephone Encounter (Signed)
HFU dx sepsis, d/c 03/25/15. Pt scheduled on 6/10.

## 2015-03-25 NOTE — Progress Notes (Signed)
*  PRELIMINARY RESULTS* Echocardiogram 2D Echocardiogram has been performed.  Zachary Santos 03/25/2015, 8:00 AM

## 2015-03-26 NOTE — Telephone Encounter (Addendum)
Discharge Date: 03/25/15  Transition Care Management Follow-up Telephone Call  How have you been since you were released from the hospital? Pt states doing well, slept good last night. No symptoms currently.    Do you understand why you were in the hospital? YES, syncope, sepsis   Do you understand the discharge instructions? YES Start Levaquin, see below.  Items Reviewed:  Medications reviewed: YES, Start Levaquin daily x 5 days  Allergies reviewed: YES, no new drug allergies  Dietary changes reviewed: YES, diabetic and cardiac diet  Referrals reviewed: n/a   Functional Questionnaire:   Activities of Daily Living (ADLs):   States they are independent in the following: All States they require assistance with the following: None   Any transportation issues/concerns?: NO, will either drive self or have someone drive him depending on how he is feeling.    Any patient concerns? Pt states cardiologist that saw him in hospital recommended Cardiolite stress test. Pt states does not have cardiologist currently. Will need referral. Advised this would be done by Morey Hummingbird at his appointment on 03/28/15. Denies any other questions or concerns at this time. Advised to call back with any prior to his appt.    Confirmed importance and date/time of follow-up visits scheduled: YES, appt with Lorane Gell 03/28/15 @ 9:30, confirmed with patient.    Confirmed with patient if condition begins to worsen call PCP or go to the ER. Patient was given the Call-a-Nurse line [phone]: YES

## 2015-03-26 NOTE — Telephone Encounter (Signed)
TCM call completed, see below.

## 2015-03-28 ENCOUNTER — Encounter: Payer: Self-pay | Admitting: Nurse Practitioner

## 2015-03-28 ENCOUNTER — Ambulatory Visit (INDEPENDENT_AMBULATORY_CARE_PROVIDER_SITE_OTHER): Payer: Medicare Other | Admitting: Nurse Practitioner

## 2015-03-28 VITALS — BP 130/60 | HR 58 | Temp 97.6°F | Resp 18 | Ht 72.0 in | Wt 191.0 lb

## 2015-03-28 DIAGNOSIS — T68XXXS Hypothermia, sequela: Secondary | ICD-10-CM

## 2015-03-28 DIAGNOSIS — R55 Syncope and collapse: Secondary | ICD-10-CM

## 2015-03-28 DIAGNOSIS — E119 Type 2 diabetes mellitus without complications: Secondary | ICD-10-CM | POA: Diagnosis not present

## 2015-03-28 DIAGNOSIS — A419 Sepsis, unspecified organism: Secondary | ICD-10-CM | POA: Diagnosis not present

## 2015-03-28 LAB — CULTURE, BLOOD (ROUTINE X 2)
Culture: NO GROWTH
Culture: NO GROWTH

## 2015-03-28 MED ORDER — GLIPIZIDE ER 2.5 MG PO TB24: 2.5000 mg | ORAL_TABLET | Freq: Every day | ORAL | Status: DC

## 2015-03-28 NOTE — Progress Notes (Signed)
Pre visit review using our clinic review tool, if applicable. No additional management support is needed unless otherwise documented below in the visit note. 

## 2015-03-28 NOTE — Assessment & Plan Note (Signed)
Refilled glipizide. Talked to pt and daughter about eating small meals 6 x a day, increasing veggies/protein, adding boost to diet. There are diabetic versions. This will help with weak feeling and diabetes.

## 2015-03-28 NOTE — Patient Instructions (Signed)
Call us back [phone] ext. [phone] and leave the name of the medication and the strength for future knowledge.   Take half a tablet tonight to help sleep.   Eat regularly 6 small meals a day (include veggies and protein)  Boost isn't a bad idea :-)   Follow up in 1 month.

## 2015-03-28 NOTE — Progress Notes (Signed)
Subjective:    Patient ID: Zachary Santos, male    DOB: 1936-03-25, 80 y.o.   MRN: [id number]  HPI  Mr. Liska is a 79 yo male following up from the hospital. He is accompanied by his daughter.   1) D/c diagnosis: Syncope and sepsis  Best he has felt since leaving hospital is today Not sleeping well, no energy, feels he has trouble falling asleep.   2) Needs refill on glipizide, 142- the other morning, doesn't check every day Daughter reports he is not eating regularly, or taking medications regularly  Eating a lot of salty foods, no enough veggies, not eating sugary foods, eating out often  3) Stress test nuclear- set up for and will obtain    4) Can't fall asleep at night  Snoring- per daughter Kristeen Miss like he wanted to jump out of skin yesterday due to anxiety, has some prescription at home and reports he took half a pill and it calms him down. Daughter and pt do not know name of it and could not identify from verbal list given.   Still smoking, not interested in Chantix, not ready to quit  Review of Systems  Constitutional: Positive for fatigue. Negative for fever, chills and diaphoresis.  HENT: Negative for tinnitus and trouble swallowing.   Eyes: Negative for visual disturbance.  Respiratory: Negative for chest tightness, shortness of breath and wheezing.   Cardiovascular: Negative for chest pain, palpitations and leg swelling.  Gastrointestinal: Negative for nausea, vomiting and diarrhea.  Endocrine: Negative for polydipsia, polyphagia and polyuria.  Skin: Negative for rash.  Neurological: Positive for syncope. Negative for dizziness, weakness and numbness.       No syncope since last visit  Psychiatric/Behavioral: Positive for sleep disturbance. The patient is nervous/anxious.    Past Medical History  Diagnosis Date  . Chronic fatigue   . Stable angina   . COPD (chronic obstructive pulmonary disease)   . Essential hypertension     a. 01/2008 Echo: EF 65%, no rwma, mod  increased PASP.  [name] CKD (chronic kidney disease), stage III   . Paget's disease   . Tobacco abuse     a. 1-1.5 ppd x 60+ yrs.  . Osteoarthritis   . Type II diabetes mellitus   . Syncope     a. 03/2015 in setting of hypotension.  . Claudication     a. R Hip.    History   Social History  . Marital Status: Married    Spouse Name: N/A  . Number of Children: N/A  . Years of Education: N/A   Occupational History  . Not on file.   Social History Main Topics  . Smoking status: Current Every Day Smoker -- 1.50 packs/day for 60 years    Types: Cigarettes  . Smokeless tobacco: Current User  . Alcohol Use: 0.0 oz/week    0 Standard drinks or equivalent per week     Comment: rare - "1% of the time."  . Drug Use: No  . Sexual Activity: Not on file   Other Topics Concern  . Not on file   Social History Narrative   Lives in [place] with wife.  Retired Administrator.  Very sedentary.  Avoids activity b/c he "gives out" and is limited by right hip/buttock discomfort/claudication.    Past Surgical History  Procedure Laterality Date  . Spine surgery      cervical spine surgery  . Appendectomy    . Laparscopic chole    . Trigeminal  neuralgia      with surgery in [place]    Family History  Problem Relation Age of Onset  . Cancer Mother     died @ 78.  [name] COPD Father     died @ 35.  . Diabetes Brother     died in early 82's.    No Known Allergies  Current Outpatient Prescriptions on File Prior to Visit  Medication Sig Dispense Refill  . amLODipine (NORVASC) 10 MG tablet Take 1 tablet (10 mg total) by mouth daily. 90 tablet 3  . aspirin 81 MG EC tablet Take 1 tablet (81 mg total) by mouth daily. 30 tablet 6  . Cyanocobalamin (VITAMIN B12 PO) Take 1 capsule by mouth daily.    [name] doxazosin (CARDURA) 2 MG tablet TAKE TWO TABLETS BY MOUTH AT BEDTIME (Patient taking differently: 4 mg every morning. ) 90 tablet 3  . lisinopril-hydrochlorothiazide (PRINZIDE) 10-12.5 MG per  tablet Take 1 tablet by mouth daily. 30 tablet 11  . triamcinolone (KENALOG) 0.025 % ointment Apply topically 4 (four) times daily as needed. (Patient taking differently: Apply 1 application topically 4 (four) times daily as needed (for dry and itching skin.). ) 30 g 3  . albuterol (PROVENTIL HFA;VENTOLIN HFA) 108 (90 BASE) MCG/ACT inhaler Inhale 2 puffs into the lungs every 6 (six) hours as needed for wheezing. (Patient not taking: Reported on 03/28/2015) 8.5 g 3  . Fluticasone-Salmeterol (ADVAIR) 250-50 MCG/DOSE AEPB Inhale 1 puff into the lungs every 12 (twelve) hours. (Patient not taking: Reported on 03/28/2015) 14 each 2   No current facility-administered medications on file prior to visit.      Objective:   Physical Exam  Constitutional: He is oriented to person, place, and time. He appears well-developed and well-nourished. No distress.  BP 130/60 mmHg  Pulse 58  Temp(Src) 97.6 F (36.4 C)  Resp 18  Ht 6' (1.829 m)  Wt 191 lb (86.637 kg)  BMI 25.90 kg/m2  SpO2 96%   HENT:  Head: Normocephalic and atraumatic.  Right Ear: External ear normal.  Left Ear: External ear normal.  Eyes: EOM are normal. Pupils are equal, round, and reactive to light. Right eye exhibits no discharge. Left eye exhibits no discharge. No scleral icterus.  Cardiovascular: Normal rate, regular rhythm and normal heart sounds.  Exam reveals no gallop and no friction rub.   No murmur heard. Pulmonary/Chest: Effort normal and breath sounds normal. No respiratory distress. He has no wheezes. He has no rales. He exhibits no tenderness.  Neurological: He is alert and oriented to person, place, and time.  Skin: Skin is warm and dry. Rash noted. He is not diaphoretic.  Red spot on left forehead  Psychiatric: He has a normal mood and affect. His behavior is normal. Judgment and thought content normal.      Assessment & Plan:

## 2015-03-28 NOTE — Assessment & Plan Note (Signed)
Normal temperature today. Feeling improved

## 2015-03-28 NOTE — Assessment & Plan Note (Signed)
No syncopal episodes since leaving hospital. Had carotid studies and signed up for nuclear stress test. No other appointments visible on our system. Follow up in 3 months.

## 2015-03-28 NOTE — Assessment & Plan Note (Signed)
Blood cultures have been negative thus far. Will watch if they send more days of results. Finished abx.

## 2015-03-31 ENCOUNTER — Telehealth: Payer: Self-pay | Admitting: *Deleted

## 2015-03-31 NOTE — Telephone Encounter (Signed)
Alprazolam 0.25 mg was not reported at time of visit.  Med list has been updated.

## 2015-04-25 ENCOUNTER — Ambulatory Visit (INDEPENDENT_AMBULATORY_CARE_PROVIDER_SITE_OTHER): Payer: Medicare Other | Admitting: Nurse Practitioner

## 2015-04-25 ENCOUNTER — Encounter: Payer: Self-pay | Admitting: Nurse Practitioner

## 2015-04-25 VITALS — BP 118/62 | HR 51 | Temp 97.6°F | Resp 16 | Ht 72.0 in | Wt 193.2 lb

## 2015-04-25 DIAGNOSIS — F411 Generalized anxiety disorder: Secondary | ICD-10-CM

## 2015-04-25 DIAGNOSIS — Z720 Tobacco use: Secondary | ICD-10-CM

## 2015-04-25 DIAGNOSIS — E119 Type 2 diabetes mellitus without complications: Secondary | ICD-10-CM

## 2015-04-25 DIAGNOSIS — E785 Hyperlipidemia, unspecified: Secondary | ICD-10-CM

## 2015-04-25 DIAGNOSIS — F17200 Nicotine dependence, unspecified, uncomplicated: Secondary | ICD-10-CM

## 2015-04-25 DIAGNOSIS — R55 Syncope and collapse: Secondary | ICD-10-CM | POA: Diagnosis not present

## 2015-04-25 DIAGNOSIS — I10 Essential (primary) hypertension: Secondary | ICD-10-CM | POA: Diagnosis not present

## 2015-04-25 LAB — LIPID PANEL
Cholesterol: 119 mg/dL (ref 0–200)
HDL: 22.1 mg/dL — ABNORMAL LOW (ref >39.00)
LDL Cholesterol: 71 mg/dL (ref 0–99)
NonHDL: 96.9
Total CHOL/HDL Ratio: 5
Triglycerides: 132 mg/dL (ref 0.0–149.0)
VLDL: 26.4 mg/dL (ref 0.0–40.0)

## 2015-04-25 LAB — BASIC METABOLIC PANEL
BUN: 17 mg/dL (ref 6–23)
CO2: 28 mEq/L (ref 19–32)
Calcium: 9.5 mg/dL (ref 8.4–10.5)
Chloride: 106 mEq/L (ref 96–112)
Creatinine, Ser: 1.43 mg/dL (ref 0.40–1.50)
GFR: 50.99 mL/min — ABNORMAL LOW (ref >60.00)
Glucose, Bld: 116 mg/dL — ABNORMAL HIGH (ref 70–99)
Potassium: 4.1 mEq/L (ref 3.5–5.1)
Sodium: 140 mEq/L (ref 135–145)

## 2015-04-25 LAB — HEMOGLOBIN A1C: Hgb A1c MFr Bld: 7 % — ABNORMAL HIGH (ref 4.6–6.5)

## 2015-04-25 MED ORDER — GLUCOSE BLOOD VI STRP: ORAL_STRIP | Status: DC

## 2015-04-25 MED ORDER — ALPRAZOLAM 0.25 MG PO TABS: 0.2500 mg | ORAL_TABLET | Freq: Every day | ORAL | Status: DC

## 2015-04-25 NOTE — Progress Notes (Signed)
Subjective:    Patient ID: Zachary Santos, male    DOB: 1935-12-01, 79 y.o.   MRN: [id number]  HPI  Zachary Santos is a 79 yo male with a follow up of DM/HTN/Anxiety.   1) Cardio- 55-60% EF normal 03/25/15 from Echo  Pt reports he needs me to set up his stress test. Since there is no mention of a stress test in the records that I can find (looked at ED notes and Echo report) will refer to Dr. Fletcher Anon for follow up outpt. Denies syncope since leaving hospital. Feels improved  2)Anxiety- Xanax- helpful, took 1/2 a couple weeks ago. Less jittery, "felt he was going to jump out of his skin" before taking medication and it helped about 30 min. Later.   3) Last eye exam few years ago Needs strips last check was in the 140's fasting   One Touch Mini  Only coffee with non-dairy creamer this morning (will get labs)   Review of Systems  Constitutional: Negative for fever, chills, diaphoresis and fatigue.  Eyes: Negative for visual disturbance.  Respiratory: Negative for chest tightness, shortness of breath and wheezing.   Cardiovascular: Negative for chest pain, palpitations and leg swelling.  Gastrointestinal: Negative for nausea, vomiting and diarrhea.  Endocrine: Negative for polydipsia, polyphagia and polyuria.  Skin: Negative for rash.  Neurological: Negative for dizziness, weakness and numbness.  Psychiatric/Behavioral: The patient is not nervous/anxious.    Past Medical History  Diagnosis Date  . Chronic fatigue   . Stable angina   . COPD (chronic obstructive pulmonary disease)   . Essential hypertension     a. 01/2008 Echo: EF 65%, no rwma, mod increased PASP.  [name] CKD (chronic kidney disease), stage III   . Paget's disease   . Tobacco abuse     a. 1-1.5 ppd x 60+ yrs.  . Osteoarthritis   . Type II diabetes mellitus   . Syncope     a. 03/2015 in setting of hypotension.  . Claudication     a. R Hip.    History   Social History  . Marital Status: Married    Spouse Name: N/A  .  Number of Children: N/A  . Years of Education: N/A   Occupational History  . Not on file.   Social History Main Topics  . Smoking status: Current Every Day Smoker -- 1.50 packs/day for 60 years    Types: Cigarettes  . Smokeless tobacco: Current User  . Alcohol Use: 0.0 oz/week    0 Standard drinks or equivalent per week     Comment: rare - "1% of the time."  . Drug Use: No  . Sexual Activity: Not on file   Other Topics Concern  . Not on file   Social History Narrative   Lives in [place] with wife.  Retired Administrator.  Very sedentary.  Avoids activity b/c he "gives out" and is limited by right hip/buttock discomfort/claudication.    Past Surgical History  Procedure Laterality Date  . Spine surgery      cervical spine surgery  . Appendectomy    . Laparscopic chole    . Trigeminal neuralgia      with surgery in [place]    Family History  Problem Relation Age of Onset  . Cancer Mother     died @ 80.  [name] COPD Father     died @ 73.  . Diabetes Brother     died in early 1's.    No  Known Allergies  Current Outpatient Prescriptions on File Prior to Visit  Medication Sig Dispense Refill  . albuterol (PROVENTIL HFA;VENTOLIN HFA) 108 (90 BASE) MCG/ACT inhaler Inhale 2 puffs into the lungs every 6 (six) hours as needed for wheezing. 8.5 g 3  . amLODipine (NORVASC) 10 MG tablet Take 1 tablet (10 mg total) by mouth daily. 90 tablet 3  . aspirin 81 MG EC tablet Take 1 tablet (81 mg total) by mouth daily. 30 tablet 6  . Cyanocobalamin (VITAMIN B12 PO) Take 1 capsule by mouth daily.    [name] doxazosin (CARDURA) 2 MG tablet TAKE TWO TABLETS BY MOUTH AT BEDTIME (Patient taking differently: 4 mg every morning. ) 90 tablet 3  . Fluticasone-Salmeterol (ADVAIR) 250-50 MCG/DOSE AEPB Inhale 1 puff into the lungs every 12 (twelve) hours. 14 each 2  . glipiZIDE (GLUCOTROL XL) 2.5 MG 24 hr tablet Take 1 tablet (2.5 mg total) by mouth daily with breakfast. 90 tablet 1  . triamcinolone  (KENALOG) 0.025 % ointment Apply topically 4 (four) times daily as needed. (Patient taking differently: Apply 1 application topically 4 (four) times daily as needed (for dry and itching skin.). ) 30 g 3  . lisinopril-hydrochlorothiazide (PRINZIDE) 10-12.5 MG per tablet Take 1 tablet by mouth daily. 30 tablet 11   No current facility-administered medications on file prior to visit.      Objective:   Physical Exam  Constitutional: He is oriented to person, place, and time. He appears well-developed and well-nourished. No distress.  BP 118/62 mmHg  Pulse 51  Temp(Src) 97.6 F (36.4 C)  Resp 16  Ht 6' (1.829 m)  Wt 193 lb 3.2 oz (87.635 kg)  BMI 26.20 kg/m2  SpO2 96%   HENT:  Head: Normocephalic and atraumatic.  Right Ear: External ear normal.  Left Ear: External ear normal.  Eyes: EOM are normal. Pupils are equal, round, and reactive to light. Right eye exhibits no discharge. Left eye exhibits no discharge. No scleral icterus.  Cardiovascular: Normal rate, regular rhythm and normal heart sounds.   Pulmonary/Chest: Effort normal and breath sounds normal. No respiratory distress. He has no wheezes. He has no rales. He exhibits no tenderness.  Neurological: He is alert and oriented to person, place, and time.  Skin: Skin is warm and dry. No rash noted. He is not diaphoretic.  Clubbing of nails  Psychiatric: He has a normal mood and affect. His behavior is normal. Judgment and thought content normal.      Assessment & Plan:

## 2015-04-25 NOTE — Assessment & Plan Note (Signed)
Stable. No syncopal or near syncopal episodes since leaving the hospital.

## 2015-04-25 NOTE — Progress Notes (Signed)
Pre visit review using our clinic review tool, if applicable. No additional management support is needed unless otherwise documented below in the visit note. 

## 2015-04-25 NOTE — Patient Instructions (Addendum)
Your strips and xanax script have been sent to the pharmacy.   Please work on stopping smoking. We have referred you to Cardiology for a stress test as needed.   Follow up in 3 months.

## 2015-04-25 NOTE — Assessment & Plan Note (Addendum)
BP Readings from Last 3 Encounters:  04/25/15 118/62  03/28/15 130/60  03/25/15 127/62   Still stable on medications. FU in 3 months. Will get a BMET today.  Referred to Dr. Fletcher Anon for follow up outpatient since he is requesting a stress test.

## 2015-04-25 NOTE — Assessment & Plan Note (Signed)
Still smoking. Encouraged him to think about stopping.

## 2015-04-25 NOTE — Assessment & Plan Note (Signed)
Still taking ASA daily. Not on a Statin. Lipid panel today.

## 2015-04-25 NOTE — Assessment & Plan Note (Signed)
Pt takes rare 1/2 of a xanax for when he feels anxious. He had one bottle and it is expired. Faxed refill to pharmacy.

## 2015-04-25 NOTE — Assessment & Plan Note (Signed)
Sent strips script for One Touch Mini to pharmacy. Asked him to check at least once daily. Stable on medications and no need for refills per pt. Will obtain A1c today.

## 2015-05-08 ENCOUNTER — Telehealth: Payer: Self-pay

## 2015-05-08 NOTE — Telephone Encounter (Signed)
l mom for pt to call to schedule appt. (referral from [employer])

## 2015-06-20 ENCOUNTER — Ambulatory Visit (INDEPENDENT_AMBULATORY_CARE_PROVIDER_SITE_OTHER): Payer: Medicare Other | Admitting: Cardiovascular Disease

## 2015-06-20 ENCOUNTER — Encounter: Payer: Self-pay | Admitting: Cardiovascular Disease

## 2015-06-20 VITALS — BP 110/54 | HR 72 | Ht 72.0 in | Wt 191.0 lb

## 2015-06-20 DIAGNOSIS — R0609 Other forms of dyspnea: Secondary | ICD-10-CM

## 2015-06-20 DIAGNOSIS — R0602 Shortness of breath: Secondary | ICD-10-CM

## 2015-06-20 DIAGNOSIS — M79604 Pain in right leg: Secondary | ICD-10-CM

## 2015-06-20 DIAGNOSIS — R55 Syncope and collapse: Secondary | ICD-10-CM | POA: Diagnosis not present

## 2015-06-20 DIAGNOSIS — I10 Essential (primary) hypertension: Secondary | ICD-10-CM

## 2015-06-20 DIAGNOSIS — R0600 Dyspnea, unspecified: Secondary | ICD-10-CM

## 2015-06-20 DIAGNOSIS — I739 Peripheral vascular disease, unspecified: Secondary | ICD-10-CM | POA: Insufficient documentation

## 2015-06-20 MED ORDER — AMLODIPINE BESYLATE 5 MG PO TABS: 5.0000 mg | ORAL_TABLET | Freq: Every day | ORAL | Status: DC

## 2015-06-20 MED ORDER — LISINOPRIL 10 MG PO TABS: 10.0000 mg | ORAL_TABLET | Freq: Every day | ORAL | Status: DC

## 2015-06-20 NOTE — Patient Instructions (Addendum)
Medication Instructions:  Your physician has recommended you make the following change in your medication:  DECREASE amlodipine to 5mg  once per day STOP taking lisinopril/HCTZ START taking lisinopril 10mg  once per day.   Labwork: none  Testing/Procedures: Your physician has requested that you have a lexiscan myoview. Please follow instruction sheet, as given.  [place]  Your caregiver has ordered a Stress Test with nuclear imaging. The purpose of this test is to evaluate the blood supply to your heart muscle. This procedure is referred to as a "Non-Invasive Stress Test." This is because other than having an IV started in your vein, nothing is inserted or "invades" your body. Cardiac stress tests are done to find areas of poor blood flow to the heart by determining the extent of coronary artery disease (CAD). Some patients exercise on a treadmill, which naturally increases the blood flow to your heart, while others who are  unable to walk on a treadmill due to physical limitations have a pharmacologic/chemical stress agent called Lexiscan . This medicine will mimic walking on a treadmill by temporarily increasing your coronary blood flow.   Please note: these test may take anywhere between 2-4 hours to complete  PLEASE REPORT TO [place] AT THE FIRST DESK WILL DIRECT YOU WHERE TO GO  Date of Procedure: Friday, Sept 9, 8:30am  Arrival Time for Procedure: 8:00am  Instructions regarding medication:   ___xx_ : Hold glipizide medication morning of procedure    PLEASE NOTIFY THE OFFICE AT LEAST 24 HOURS IN ADVANCE IF YOU ARE UNABLE TO KEEP YOUR APPOINTMENT.  [phone] AND  PLEASE NOTIFY NUCLEAR MEDICINE AT [facility] AT LEAST 24 HOURS IN ADVANCE IF YOU ARE UNABLE TO KEEP YOUR APPOINTMENT. [phone]  How to prepare for your Myoview test:   Do not eat or drink after midnight  No caffeine for 24 hours prior to test  No smoking 24 hours prior to  test.  Your medication may be taken with water.  If your doctor stopped a medication because of this test, do not take that medication.  Ladies, please do not wear dresses.  Skirts or pants are appropriate. Please wear a short sleeve shirt.  No perfume, cologne or lotion.  Wear comfortable walking shoes. No heels!          Your physician has requested that you have a lower extremity arterial exercise duplex. During this test, exercise and ultrasound are used to evaluate arterial blood flow in the legs. Allow one hour for this exam. There are no restrictions or special instructions.   Follow-Up: Your physician recommends that you schedule a follow-up appointment in: one month with Dr. Fletcher Anon.    Any Other Special Instructions Will Be Listed Below (If Applicable).  Nuclear Medicine Exam A nuclear medicine exam is a safe and painless imaging test. It helps to detect and diagnose disease in the body as well as provide information about organ function and structure.  Nuclear scans are most often done of the:  Lungs.  Heart.  Thyroid gland.  Bones.  Abdomen. HOW A NUCLEAR MEDICINE EXAM WORKS A nuclear medicine exam works by using a radioactive tracer. The material is given either by an IV (intravenous) injection or it may be swallowed. After the tracer is in the body, it is absorbed by your body's organs. A large scanning machine that uses a special camera detects the radioactivity in your body. A computerized image is then formed regarding the area of concern. The small  amounts of radioactive material used in a nuclear medicine exam are found to be medically safe. However, because radioactive material is used, this test is not done if you are pregnant or nursing.  BEFORE THE PROCEDURE  If available, bring previous imaging studies such as x-rays, etc. with you to the exam.  Arrive early for your exam. PROCEDURE  An IV may be started before the exam begins.  Depending on the  type of examination, will lie on a table or sit in a chair during the exam.  The nuclear medicine exam will take about 30 to 60 minutes to complete. AFTER THE PROCEDURE  After your scan is completed, the image(s) will be evaluated by a specialist. It is important that you follow up with your caregiver to find out your test results.  You may return to your regular activity as instructed by your caregiver. SEEK IMMEDIATE MEDICAL CARE IF: You have shortness of breath or difficulty breathing. MAKE SURE YOU:   Understand these instructions.  Will watch your condition.  Will get help right away if you are not doing well or get worse. Document Released: 11/11/2004 Document Revised: 12/27/2011 Document Reviewed: 12/26/2008 [facility] Patient Information 2015 [place], [place]. This information is not intended to replace advice given to you by your health care provider. Make sure you discuss any questions you have with your health care provider.

## 2015-06-20 NOTE — Assessment & Plan Note (Signed)
He has significant exertional dyspnea without chest pain with multiple risk factors for coronary artery disease. Thus, I requested a pharmacologic nuclear stress test. He is not able to exercise on a treadmill.

## 2015-06-20 NOTE — Assessment & Plan Note (Signed)
I made changes in his blood pressure medications as outlined above.

## 2015-06-20 NOTE — Assessment & Plan Note (Signed)
Difficult to determine if this is new to peripheral arterial disease or arthritis in his hip joint. He does have prolonged history of smoking. His femoral pulses are relatively well preserved but distal pulses are diminished. I requested lower extremity arterial Doppler for evaluation.

## 2015-06-20 NOTE — Progress Notes (Signed)
HPI  This is a 79 year old man who is here today for a follow-up visit after hospitalization in June for syncope. He has known history of hypertension, type 2 diabetes and prolonged history of tobacco use. He was hospitalized in June after he had a syncopal episode. It happened a day after he moved a refrigerator in a hot weather. He was sitting at his computer when he felt dizzy and lightheaded. He checked his blood pressure and was 77/52. He had a brief loss of consciousness for about 30 seconds. He was nauseated and diaphoretic. EMS were called and upon arrival to the emergency room, his blood pressure was 82/50 with a temperature of 95 Fahrenheit. Was initially thought that he might be septic. However, it appears that he was volume depleted and improved with hydration. He had an echocardiogram which showed normal LV systolic function with mildly dilated left atrium. The patient continues to have episodes of dizziness which are random but mostly with change in position. He complains of dyspnea with minimal activities without any chest pain. He also complains of right hip and bilateral pain with walking short distance but he attributes that to pain in the joint. He continues to smoke.  No Known Allergies   Current Outpatient Prescriptions on File Prior to Visit  Medication Sig Dispense Refill  . aspirin 81 MG EC tablet Take 1 tablet (81 mg total) by mouth daily. 30 tablet 6  . doxazosin (CARDURA) 2 MG tablet TAKE TWO TABLETS BY MOUTH AT BEDTIME (Patient taking differently: 4 mg every morning. ) 90 tablet 3  . glipiZIDE (GLUCOTROL XL) 2.5 MG 24 hr tablet Take 1 tablet (2.5 mg total) by mouth daily with breakfast. 90 tablet 1  . glucose blood test strip Test at least once daily. OneTouch Mini 50 each 11  . triamcinolone (KENALOG) 0.025 % ointment Apply topically 4 (four) times daily as needed. (Patient taking differently: Apply 1 application topically 4 (four) times daily as needed (for dry and  itching skin.). ) 30 g 3   No current facility-administered medications on file prior to visit.     Past Medical History  Diagnosis Date  . Chronic fatigue   . Stable angina   . COPD (chronic obstructive pulmonary disease)   . Essential hypertension     a. 01/2008 Echo: EF 65%, no rwma, mod increased PASP.  [name] CKD (chronic kidney disease), stage III   . Paget's disease   . Tobacco abuse     a. 1-1.5 ppd x 60+ yrs.  . Osteoarthritis   . Type II diabetes mellitus   . Syncope     a. 03/2015 in setting of hypotension.  . Claudication     a. R Hip.     Past Surgical History  Procedure Laterality Date  . Spine surgery      cervical spine surgery  . Appendectomy    . Laparscopic chole    . Trigeminal neuralgia      with surgery in [place]     Family History  Problem Relation Age of Onset  . Cancer Mother     died @ 75.  [name] COPD Father     died @ 57.  . Diabetes Brother     died in early 77's.     Social History   Social History  . Marital Status: Married    Spouse Name: N/A  . Number of Children: N/A  . Years of Education: N/A   Occupational History  .  Not on file.   Social History Main Topics  . Smoking status: Current Every Day Smoker -- 1.50 packs/day for 60 years    Types: Cigarettes  . Smokeless tobacco: Current User  . Alcohol Use: 0.0 oz/week    0 Standard drinks or equivalent per week     Comment: rare - "1% of the time."  . Drug Use: No  . Sexual Activity: Not on file   Other Topics Concern  . Not on file   Social History Narrative   Lives in [place] with wife.  Retired Administrator.  Very sedentary.  Avoids activity b/c he "gives out" and is limited by right hip/buttock discomfort/claudication.       PHYSICAL EXAM   BP 110/54 mmHg  Pulse 72  Ht 6' (1.829 m)  Wt 191 lb (86.637 kg)  BMI 25.90 kg/m2 Constitutional: He is oriented to person, place, and time. He appears well-developed and well-nourished. No distress.  HENT: No  nasal discharge.  Head: Normocephalic and atraumatic.  Eyes: Pupils are equal and round.  No discharge. Neck: Normal range of motion. Neck supple. No JVD present. No thyromegaly present.  Cardiovascular: Normal rate, regular rhythm, normal heart sounds. Exam reveals no gallop and no friction rub. No murmur heard.  Pulmonary/Chest: Effort normal and breath sounds normal. No stridor. No respiratory distress. He has no wheezes. He has no rales. He exhibits no tenderness.  Abdominal: Soft. Bowel sounds are normal. He exhibits no distension. There is no tenderness. There is no rebound and no guarding.  Musculoskeletal: Normal range of motion. He exhibits no edema and no tenderness.  Neurological: He is alert and oriented to person, place, and time. Coordination normal.  Skin: Skin is warm and dry. No rash noted. He is not diaphoretic. No erythema. No pallor.  Psychiatric: He has a normal mood and affect. His behavior is normal. Judgment and thought content normal.  Vascular: Femoral pulses are normal bilaterally. Distal pulses are diminished.     CWC:BJSEG  Rhythm  -With rate variation  cv = 10. Low voltage in limb leads.   ABNORMAL     ASSESSMENT AND PLAN

## 2015-06-20 NOTE — Assessment & Plan Note (Signed)
This was likely due to hypotension. The patient continues to be dizzy with orthostatic hypotension documented today. His blood pressure dropped from 117-97 from the lying to standing position with increase of heart rate from 66-89 bpm. Thus, I discontinued hydrochlorothiazide today and decrease the dose of amlodipine to 5 mg once daily. He is on Cardura which is also likely contributing but he does have history of BPH and thus I elected not to decrease the dose.

## 2015-06-24 ENCOUNTER — Telehealth: Payer: Self-pay

## 2015-06-24 NOTE — Telephone Encounter (Signed)
Left detailed message with CB number on pt phone regarding new time for lexi scan on Friday, Sept 9 New time: arrive 8:30, test at 9:00

## 2015-06-26 ENCOUNTER — Other Ambulatory Visit: Payer: Self-pay | Admitting: Internal Medicine

## 2015-06-27 ENCOUNTER — Telehealth: Payer: Self-pay
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
## 2015-06-27 NOTE — Telephone Encounter (Signed)
Spoke with scheduling and nuc med to cancel test. Will call pt next week to reschedule

## 2015-07-01 ENCOUNTER — Telehealth: Payer: Self-pay

## 2015-07-01 NOTE — Telephone Encounter (Signed)
Left message on machine for pt to CB and reschedule lexi myoview.

## 2015-07-08 ENCOUNTER — Other Ambulatory Visit: Payer: Self-pay | Admitting: *Deleted

## 2015-07-08 DIAGNOSIS — I10 Essential (primary) hypertension: Secondary | ICD-10-CM

## 2015-07-08 MED ORDER — DOXAZOSIN MESYLATE 2 MG PO TABS: ORAL_TABLET | ORAL | Status: DC

## 2015-07-09 ENCOUNTER — Telehealth: Payer: Self-pay

## 2015-07-09 NOTE — Telephone Encounter (Signed)
S/w pt who is agreeable to rescheduling lexi myoview on Sept 27, 9:00am, arrival time 8:30am Reviewed instructions including medications to hold.  Pt verbalized understanding with no further questions.  Appt scheduled.

## 2015-07-11 ENCOUNTER — Other Ambulatory Visit: Payer: Self-pay | Admitting: Cardiovascular Disease

## 2015-07-11 DIAGNOSIS — R0989 Other specified symptoms and signs involving the circulatory and respiratory systems: Secondary | ICD-10-CM

## 2015-07-11 DIAGNOSIS — M25551 Pain in right hip: Secondary | ICD-10-CM

## 2015-07-15 ENCOUNTER — Encounter
Admission: RE | Admit: 2015-07-15 | Discharge: 2015-07-15 | Disposition: A | Discharge status: Home / Self Care | Payer: Medicare Other | Source: Ambulatory Visit | Attending: Cardiovascular Disease | Admitting: Cardiovascular Disease

## 2015-07-15 ENCOUNTER — Ambulatory Visit: Payer: Medicare Other

## 2015-07-15 DIAGNOSIS — R0602 Shortness of breath: Secondary | ICD-10-CM

## 2015-07-16 ENCOUNTER — Telehealth: Payer: Self-pay
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
## 2015-07-17 ENCOUNTER — Telehealth: Payer: Self-pay

## 2015-07-17 NOTE — Telephone Encounter (Signed)
Left message on pt VM to CB to reschedule lexi myoview.

## 2015-07-18 ENCOUNTER — Telehealth: Payer: Self-pay
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
## 2015-07-24 ENCOUNTER — Ambulatory Visit: Payer: Medicare Other | Admitting: Cardiovascular Disease

## 2015-07-28 ENCOUNTER — Encounter: Payer: Self-pay | Admitting: Nurse Practitioner

## 2015-07-28 ENCOUNTER — Ambulatory Visit (INDEPENDENT_AMBULATORY_CARE_PROVIDER_SITE_OTHER): Payer: Medicare Other | Admitting: Nurse Practitioner

## 2015-07-28 VITALS — BP 122/60 | HR 47 | Temp 98.0°F | Resp 18 | Ht 72.0 in | Wt 194.0 lb

## 2015-07-28 DIAGNOSIS — I10 Essential (primary) hypertension: Secondary | ICD-10-CM

## 2015-07-28 DIAGNOSIS — Z23 Encounter for immunization: Secondary | ICD-10-CM | POA: Diagnosis not present

## 2015-07-28 DIAGNOSIS — R21 Rash and other nonspecific skin eruption: Secondary | ICD-10-CM | POA: Diagnosis not present

## 2015-07-28 DIAGNOSIS — E119 Type 2 diabetes mellitus without complications: Secondary | ICD-10-CM

## 2015-07-28 LAB — COMPREHENSIVE METABOLIC PANEL
ALT: 19 U/L (ref 0–53)
AST: 19 U/L (ref 0–37)
Albumin: 4.2 g/dL (ref 3.5–5.2)
Alkaline Phosphatase: 231 U/L — ABNORMAL HIGH (ref 39–117)
BUN: 19 mg/dL (ref 6–23)
CO2: 29 mEq/L (ref 19–32)
Calcium: 9.5 mg/dL (ref 8.4–10.5)
Chloride: 103 mEq/L (ref 96–112)
Creatinine, Ser: 1.51 mg/dL — ABNORMAL HIGH (ref 0.40–1.50)
GFR: 47.86 mL/min — ABNORMAL LOW (ref >60.00)
Glucose, Bld: 126 mg/dL — ABNORMAL HIGH (ref 70–99)
Potassium: 4.3 mEq/L (ref 3.5–5.1)
Sodium: 139 mEq/L (ref 135–145)
Total Bilirubin: 0.6 mg/dL (ref 0.2–1.2)
Total Protein: 6.9 g/dL (ref 6.0–8.3)

## 2015-07-28 LAB — HEMOGLOBIN A1C: Hgb A1c MFr Bld: 6.6 % — ABNORMAL HIGH (ref 4.6–6.5)

## 2015-07-28 MED ORDER — DOXAZOSIN MESYLATE 2 MG PO TABS: ORAL_TABLET | ORAL | Status: DC

## 2015-07-28 MED ORDER — GLIPIZIDE ER 2.5 MG PO TB24: 2.5000 mg | ORAL_TABLET | Freq: Every day | ORAL | Status: DC

## 2015-07-28 MED ORDER — TRIAMCINOLONE ACETONIDE 0.025 % EX OINT: 1.0000 "application " | TOPICAL_OINTMENT | Freq: Four times a day (QID) | CUTANEOUS | Status: DC | PRN

## 2015-07-28 NOTE — Assessment & Plan Note (Addendum)
Patient is compliant with changes that Dr. Fletcher Anon made to his blood pressure medications. Patient's blood pressure is within normal limits today pulse is slightly lower than usual, but repeat was improved, but still bradycardic. Patient reports he will no longer go through with any of that scan set up by Dr. Tyrell Antonio office due to no complaints at this time. Patient was encouraged to do so to prevent future heart attack or stroke for which he is at high risk for. He feels he would be a burden to his children and she is not to go through with any major medical decisions at this time.

## 2015-07-28 NOTE — Assessment & Plan Note (Signed)
Refill Kenalog today.

## 2015-07-28 NOTE — Assessment & Plan Note (Signed)
Patient states he has been out of CAD dura for 1 month. Refill was sent in to pharmacy on 07/08/2015 with receipt confirmed by pharmacy. Patient was told this information and he reports he is not checked in with his pharmacy within the last few weeks.

## 2015-07-28 NOTE — Progress Notes (Signed)
Patient ID: [name], male    DOB: 12/09/1935  Age: 79 y.o. MRN: [id number]  CC: Follow-up   HPI Zachary Santos presents for follow up of DM Type II and HTN.   1) DM- blood sugars running from 92-100   Denies any hypoglycemic events  Staying away from meat, eating a lot of pasta, drinking milk, tea, and coffee.  Eye exam- Needs to Schedule  Foot exam and A1c due today   2) HTN- saw Dr. Fletcher Anon on 06/20/15 and d'cd HCTZ and downgraded amlodipine to 5 mg daily   Patient does not want to pursue his NM stress test at this time. He feels that "if his time comes it comes and I don't want to be a burden on anyone". Depression screen was 0 on PHQ-2  History Zachary Santos has a past medical history of Chronic fatigue; Stable angina (HCC); COPD (chronic obstructive pulmonary disease) ([place]); Essential hypertension; CKD (chronic kidney disease), stage III; Paget's disease; Tobacco abuse; Osteoarthritis; Type II diabetes mellitus ([place]); Syncope; and Claudication ([place]).   He has past surgical history that includes Spine surgery; Appendectomy; laparscopic chole; and trigeminal neuralgia.   His family history includes COPD in his father; Cancer in his mother; Diabetes in his brother.He reports that he has been smoking Cigarettes.  He has a 90 pack-year smoking history. He uses smokeless tobacco. He reports that he drinks alcohol. He reports that he does not use illicit drugs.  Outpatient Prescriptions Prior to Visit  Medication Sig Dispense Refill  . amLODipine (NORVASC) 5 MG tablet Take 1 tablet (5 mg total) by mouth daily. 30 tablet 3  . aspirin 81 MG EC tablet Take 1 tablet (81 mg total) by mouth daily. 30 tablet 6  . glucose blood test strip Test at least once daily. OneTouch Mini 50 each 11  . lisinopril (PRINIVIL,ZESTRIL) 10 MG tablet Take 1 tablet (10 mg total) by mouth daily. 30 tablet 3  . doxazosin (CARDURA) 2 MG tablet TAKE TWO TABLETS BY MOUTH AT BEDTIME 90 tablet 3  . glipiZIDE (GLUCOTROL XL) 2.5  MG 24 hr tablet Take 1 tablet (2.5 mg total) by mouth daily with breakfast. 90 tablet 1  . triamcinolone (KENALOG) 0.025 % ointment Apply topically 4 (four) times daily as needed. (Patient taking differently: Apply 1 application topically 4 (four) times daily as needed (for dry and itching skin.). ) 30 g 3   No facility-administered medications prior to visit.    ROS Review of Systems  Constitutional: Negative for fever, chills, diaphoresis, appetite change and fatigue.  Eyes: Negative for visual disturbance.  Respiratory: Negative for chest tightness, shortness of breath and wheezing.   Cardiovascular: Positive for leg swelling. Negative for chest pain and palpitations.  Gastrointestinal: Negative for nausea, vomiting and diarrhea.  Endocrine: Negative for polydipsia, polyphagia and polyuria.  Neurological: Negative for dizziness, weakness and numbness.  Psychiatric/Behavioral: The patient is not nervous/anxious.     Objective:  BP 122/60 mmHg  Pulse 47  Temp(Src) 98 F (36.7 C)  Resp 18  Ht 6' (1.829 m)  Wt 194 lb (87.998 kg)  BMI 26.31 kg/m2  SpO2 95% Repeat pulse- 56  Physical Exam  Constitutional: He is oriented to person, place, and time. He appears well-developed and well-nourished. No distress.  HENT:  Head: Normocephalic and atraumatic.  Right Ear: External ear normal.  Left Ear: External ear normal.  Cardiovascular: Normal rate and regular rhythm.   Pulmonary/Chest: Effort normal and breath sounds normal. No respiratory distress. He  has no wheezes. He has no rales. He exhibits no tenderness.  Neurological: He is alert and oriented to person, place, and time.  Monofilament exam normal Decreased distant pulses  Skin: Skin is warm and dry. No rash noted. He is not diaphoretic.  Clubbing of nails of hands and feet  Psychiatric: He has a normal mood and affect. His behavior is normal. Judgment and thought content normal.  PHQ-2 = 0    Assessment & Plan:   Zachary Santos was  seen today for follow-up.  Diagnoses and all orders for this visit:  Rash and nonspecific skin eruption -     triamcinolone (KENALOG) 0.025 % ointment; Apply 1 application topically 4 (four) times daily as needed (for dry and itching skin.).  Type 2 diabetes mellitus without complication, without long-term current use of insulin (HCC) -     glipiZIDE (GLUCOTROL XL) 2.5 MG 24 hr tablet; Take 1 tablet (2.5 mg total) by mouth daily with breakfast. -     HgB A1c -     Comp Met (CMET)  Essential hypertension, benign -     doxazosin (CARDURA) 2 MG tablet; TAKE TWO TABLETS BY MOUTH AT BEDTIME -     HgB A1c -     Comp Met (CMET)  TOBACCO ABUSE  Other orders -     Flu Vaccine QUAD 36+ mos IM   I have changed Zachary Santos triamcinolone. I am also having him maintain his aspirin, glucose blood, amLODipine, lisinopril, glipiZIDE, and doxazosin.  Meds ordered this encounter  Medications  . triamcinolone (KENALOG) 0.025 % ointment    Sig: Apply 1 application topically 4 (four) times daily as needed (for dry and itching skin.).    Dispense:  30 g    Refill:  3    Order Specific Question:  Supervising Provider    Answer:  Derrel Nip, TERESA L [[id number]]  . glipiZIDE (GLUCOTROL XL) 2.5 MG 24 hr tablet    Sig: Take 1 tablet (2.5 mg total) by mouth daily with breakfast.    Dispense:  90 tablet    Refill:  1    Order Specific Question:  Supervising Provider    Answer:  Deborra Medina L [[id number]]  . doxazosin (CARDURA) 2 MG tablet    Sig: TAKE TWO TABLETS BY MOUTH AT BEDTIME    Dispense:  90 tablet    Refill:  3    Order Specific Question:  Supervising Provider    Answer:  Crecencio Mc [[id number]]     Follow-up: Return in about 3 months (around 10/28/2015) for Follow up.

## 2015-07-28 NOTE — Progress Notes (Signed)
Pre visit review using our clinic review tool, if applicable. No additional management support is needed unless otherwise documented below in the visit note. 

## 2015-07-28 NOTE — Assessment & Plan Note (Signed)
We will obtain A1c today. Foot exam was normal with the exception of chronically decreased pulses and clubbing of nails on feet and hands. Pt unable to urinate today, will wait on Urine microalbumin. Encouraged eye exam. 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

## 2015-07-28 NOTE — Assessment & Plan Note (Addendum)
Checking a C met today. Patient reports increased hesitancy with urination patient has been out of catheter for 1 month. Encouraged non-caffeinated fluids.

## 2015-07-28 NOTE — Patient Instructions (Signed)
Please visit the lab before leaving today.   Please get your eye exam before the end of the year.

## 2015-07-28 NOTE — Assessment & Plan Note (Signed)
Patient is not ready to quit. He was not happy that I asked again about tobacco cessation, but he reports he is trying to cut down. He declines any help at this time

## 2015-09-03 ENCOUNTER — Other Ambulatory Visit: Payer: Self-pay

## 2015-09-03 ENCOUNTER — Telehealth: Payer: Self-pay

## 2015-09-03 MED ORDER — LISINOPRIL 10 MG PO TABS: 10.0000 mg | ORAL_TABLET | Freq: Every day | ORAL | Status: DC

## 2015-09-03 NOTE — Telephone Encounter (Signed)
Needs new rx for Lisinopril 5 mg

## 2015-09-03 NOTE — Telephone Encounter (Signed)
S/w pt wife, Arbie Cookey, who states pt is not home at this time.  Pt has cancelled/no show lexi myoview x 2, LE arterial x2, as well as f/u with Dr. Fletcher Anon. Pt called today requesting lisinopril refill. Explained to wife that I would refill one month however, pt needs f/u appt in order to have future refills. She verbalized understanding, states she thinks he does want tests performed and will give message to pt.  Provided phone number to wife for appt.  Forward info to scheduling.

## 2015-09-05 ENCOUNTER — Telehealth: Payer: Self-pay

## 2015-09-05 NOTE — Telephone Encounter (Signed)
l mom to schedule LE arterial, and f/u with Dr. Fletcher Anon

## 2015-09-30 ENCOUNTER — Other Ambulatory Visit: Payer: Self-pay

## 2015-09-30 DIAGNOSIS — I10 Essential (primary) hypertension: Secondary | ICD-10-CM

## 2015-09-30 DIAGNOSIS — R21 Rash and other nonspecific skin eruption: Secondary | ICD-10-CM

## 2015-09-30 MED ORDER — AMLODIPINE BESYLATE 5 MG PO TABS: 5.0000 mg | ORAL_TABLET | Freq: Every day | ORAL | Status: DC

## 2015-09-30 MED ORDER — DOXAZOSIN MESYLATE 2 MG PO TABS: ORAL_TABLET | ORAL | Status: DC

## 2015-09-30 MED ORDER — LISINOPRIL 10 MG PO TABS: 10.0000 mg | ORAL_TABLET | Freq: Every day | ORAL | Status: DC

## 2015-09-30 NOTE — Telephone Encounter (Signed)
Refill sent for amlodipine, lisinopril and cardura for 90- day supply to [facility]

## 2015-10-03 ENCOUNTER — Telehealth: Payer: Self-pay | Admitting: Cardiovascular Disease

## 2015-10-03 NOTE — Telephone Encounter (Signed)
Pharmacy needs to clarify Medication dosage for Lisinopril 10 mg po per order patient says he was decreased to 5mg  po daily .  Please call [place] [phone]

## 2015-10-03 NOTE — Telephone Encounter (Signed)
Spoke with Zachary Santos to clarify Lisinopril 10 mg. Pt is taking Lisinopril 10 mg 1 tablet by mouth daily according to last OV.

## 2015-10-22 ENCOUNTER — Ambulatory Visit (INDEPENDENT_AMBULATORY_CARE_PROVIDER_SITE_OTHER): Payer: Medicare Other | Admitting: Nurse Practitioner

## 2015-10-22 VITALS — BP 148/76 | HR 64 | Ht 72.0 in | Wt 191.0 lb

## 2015-10-22 DIAGNOSIS — D239 Other benign neoplasm of skin, unspecified: Secondary | ICD-10-CM | POA: Diagnosis not present

## 2015-10-22 DIAGNOSIS — D229 Melanocytic nevi, unspecified: Secondary | ICD-10-CM

## 2015-10-22 DIAGNOSIS — E119 Type 2 diabetes mellitus without complications: Secondary | ICD-10-CM | POA: Diagnosis not present

## 2015-10-22 MED ORDER — TRIAMCINOLONE ACETONIDE 0.05 % EX OINT: 1.0000 "application " | TOPICAL_OINTMENT | Freq: Two times a day (BID) | CUTANEOUS | Status: DC

## 2015-10-22 MED ORDER — GLIPIZIDE ER 2.5 MG PO TB24: 2.5000 mg | ORAL_TABLET | Freq: Every day | ORAL | Status: DC

## 2015-10-22 NOTE — Progress Notes (Signed)
Patient ID: Zachary Santos, male    DOB: 01-Sep-1936  Age: 80 y.o. MRN: [id number]  CC: Follow-up   HPI Zachary Santos presents for follow up of multiple concerns.   1) Eye exam- not UTD Refills Needs glipizide and ointment (upped the ointment) Tobacco Use- scared of chantix, feels he is not ready to quit at his age, he would like to but does not think it is doable.  Smoking 1.5 ppd   2) H/o paget's disease- Reason for alk phos elevated-   3) Rash same and breaking out on left cheek-dermatologist in past Dr. Ammie Santos soap and cream on it   4) Needs new Handicap form for DMV  5) BS- 105 whenever he remembers  Feels he knows when his sugar drops- headache, feels mind wandering   Takes a hard candy and sucks on it until he feels improved  History Zachary Santos has a past medical history of Chronic fatigue; Stable angina (HCC); COPD (chronic obstructive pulmonary disease) ([place]); Essential hypertension; CKD (chronic kidney disease), stage III; Paget's disease; Tobacco abuse; Osteoarthritis; Type II diabetes mellitus ([place]); Syncope; and Claudication ([place]).   He has past surgical history that includes Spine surgery; Appendectomy; laparscopic chole; and trigeminal neuralgia.   His family history includes COPD in his father; Cancer in his mother; Diabetes in his brother.He reports that he has been smoking Cigarettes.  He has a 90 pack-year smoking history. He uses smokeless tobacco. He reports that he drinks alcohol. He reports that he does not use illicit drugs.  Outpatient Prescriptions Prior to Visit  Medication Sig Dispense Refill  . amLODipine (NORVASC) 5 MG tablet Take 1 tablet (5 mg total) by mouth daily. 90 tablet 3  . doxazosin (CARDURA) 2 MG tablet TAKE TWO TABLETS BY MOUTH AT BEDTIME 180 tablet 3  . glucose blood test strip Test at least once daily. OneTouch Mini 50 each 11  . lisinopril (PRINIVIL,ZESTRIL) 10 MG tablet Take 1 tablet (10 mg total) by mouth daily. 30 tablet 3  .  glipiZIDE (GLUCOTROL XL) 2.5 MG 24 hr tablet Take 1 tablet (2.5 mg total) by mouth daily with breakfast. 90 tablet 1  . triamcinolone (KENALOG) 0.025 % ointment Apply 1 application topically 4 (four) times daily as needed (for dry and itching skin.). 30 g 3  . aspirin 81 MG EC tablet Take 1 tablet (81 mg total) by mouth daily. 30 tablet 6   No facility-administered medications prior to visit.    ROS Review of Systems  Constitutional: Negative for fever, chills, diaphoresis and fatigue.  HENT: Negative for congestion, ear discharge, ear pain, postnasal drip, rhinorrhea, sinus pressure, sneezing, sore throat, tinnitus, trouble swallowing and voice change.   Eyes: Negative for visual disturbance.  Respiratory: Positive for cough. Negative for chest tightness, shortness of breath and wheezing.   Cardiovascular: Negative for chest pain, palpitations and leg swelling.  Gastrointestinal: Negative for nausea, vomiting and diarrhea.  Skin: Positive for rash.  Neurological: Negative for dizziness, weakness and numbness.  Psychiatric/Behavioral: The patient is not nervous/anxious.     Objective:  BP 148/76 mmHg  Pulse 64  Ht 6' (1.829 m)  Wt 191 lb (86.637 kg)  BMI 25.90 kg/m2  SpO2 95%  Physical Exam  Constitutional: He is oriented to person, place, and time. He appears well-developed and well-nourished. No distress.  HENT:  Head: Normocephalic and atraumatic.    Right Ear: External ear normal.  Left Ear: External ear normal.  Mouth/Throat: No oropharyngeal exudate.  Multiple  nevi and a large patch hyperpigmented on left cheek. Ears- probable AK   Eyes: EOM are normal. Pupils are equal, round, and reactive to light. Right eye exhibits no discharge. Left eye exhibits no discharge. No scleral icterus.  Cardiovascular: Normal rate, regular rhythm and normal heart sounds.  Exam reveals no gallop and no friction rub.   No murmur heard. Pulmonary/Chest: Effort normal and breath sounds  normal. No respiratory distress. He has no wheezes. He has no rales. He exhibits no tenderness.  Neurological: He is alert and oriented to person, place, and time.  Skin: Skin is warm and dry. No rash noted. He is not diaphoretic.  Psychiatric: He has a normal mood and affect. His behavior is normal. Judgment and thought content normal.    Assessment & Plan:   Zachary Santos was seen today for follow-up.  Diagnoses and all orders for this visit:  Type 2 diabetes mellitus without complication, without long-term current use of insulin (HCC) -     glipiZIDE (GLUCOTROL XL) 2.5 MG 24 hr tablet; Take 1 tablet (2.5 mg total) by mouth daily with breakfast.  Atypical nevi -     Ambulatory referral to Dermatology  Other orders -     TRIAMCINOLONE ACETONIDE, TOP, 0.05 % OINT; Apply 1 application topically 2 (two) times daily.  I have discontinued Mr. Joyce triamcinolone. I am also having him start on TRIAMCINOLONE ACETONIDE (TOP). Additionally, I am having him maintain his aspirin, glucose blood, amLODipine, doxazosin, lisinopril, and glipiZIDE.  Meds ordered this encounter  Medications  . glipiZIDE (GLUCOTROL XL) 2.5 MG 24 hr tablet    Sig: Take 1 tablet (2.5 mg total) by mouth daily with breakfast.    Dispense:  90 tablet    Refill:  1    Order Specific Question:  Supervising Provider    Answer:  Deborra Medina L [[id number]]  . TRIAMCINOLONE ACETONIDE, TOP, 0.05 % OINT    Sig: Apply 1 application topically 2 (two) times daily.    Dispense:  17 g    Refill:  2    Order Specific Question:  Supervising Provider    Answer:  Crecencio Mc [[id number]]     Follow-up: Return in about 3 months (around 01/20/2016) for follow up with fasting labs.

## 2015-10-22 NOTE — Patient Instructions (Signed)
Doxazosin at night if you can please  We will call you with your referral to dermatology and your disability form for pick up.  See you in 3 months or sooner if needed for follow up.

## 2015-10-27 ENCOUNTER — Encounter: Payer: Self-pay | Admitting: Nurse Practitioner

## 2015-10-27 DIAGNOSIS — D229 Melanocytic nevi, unspecified: Secondary | ICD-10-CM | POA: Insufficient documentation

## 2015-10-27 NOTE — Assessment & Plan Note (Signed)
Lab Results  Component Value Date   HGBA1C 6.6* 07/28/2015   HGBA1C 7.0* 04/25/2015   HGBA1C 7.6* 08/05/2014   Lab Results  Component Value Date   MICROALBUR 5.58* 02/02/2010   LDLCALC 71 04/25/2015   CREATININE 1.51* 07/28/2015   Need a repeat microalbumin

## 2015-10-27 NOTE — Assessment & Plan Note (Signed)
Multiple generalized atypical nevi- referred to dermatology (Dr. Sharlett Iles) for care and full body exam. He is a smoker

## 2015-11-19 ENCOUNTER — Telehealth: Payer: Self-pay

## 2015-11-19 NOTE — Telephone Encounter (Signed)
Spoke with pharmacy to change 0.1%.

## 2015-11-19 NOTE — Telephone Encounter (Signed)
Need for clarification for Triamcinolon 0.05% oint.  Only comes in 0.5%, 0.1%, or 0.025%.  Please advise change to which one? Thanks :)

## 2015-11-19 NOTE — Telephone Encounter (Signed)
Do 0.1% please

## 2015-11-25 ENCOUNTER — Telehealth: Payer: Self-pay | Admitting: Cardiovascular Disease

## 2015-11-25 NOTE — Telephone Encounter (Signed)
Attempted to schedule LE art. Test and nm study .  Patient not interested in r/s at this time and will call usif

## 2015-11-25 NOTE — Telephone Encounter (Signed)
Decides to schedule these.

## 2016-01-20 ENCOUNTER — Ambulatory Visit: Payer: Medicare Other | Admitting: Nurse Practitioner

## 2016-01-22 ENCOUNTER — Ambulatory Visit (INDEPENDENT_AMBULATORY_CARE_PROVIDER_SITE_OTHER): Payer: Medicare Other | Admitting: Nurse Practitioner

## 2016-01-22 ENCOUNTER — Encounter: Payer: Self-pay | Admitting: Nurse Practitioner

## 2016-01-22 VITALS — BP 126/68 | HR 60 | Temp 97.5°F | Ht 72.0 in | Wt 197.0 lb

## 2016-01-22 DIAGNOSIS — D229 Melanocytic nevi, unspecified: Secondary | ICD-10-CM

## 2016-01-22 DIAGNOSIS — N400 Enlarged prostate without lower urinary tract symptoms: Secondary | ICD-10-CM | POA: Diagnosis not present

## 2016-01-22 DIAGNOSIS — D239 Other benign neoplasm of skin, unspecified: Secondary | ICD-10-CM | POA: Diagnosis not present

## 2016-01-22 DIAGNOSIS — I10 Essential (primary) hypertension: Secondary | ICD-10-CM

## 2016-01-22 MED ORDER — DOXAZOSIN MESYLATE 4 MG PO TABS: 4.0000 mg | ORAL_TABLET | Freq: Every day | ORAL | Status: DC

## 2016-01-22 MED ORDER — LISINOPRIL 10 MG PO TABS: 10.0000 mg | ORAL_TABLET | Freq: Every day | ORAL | Status: DC

## 2016-01-22 MED ORDER — AMLODIPINE BESYLATE 2.5 MG PO TABS: 2.5000 mg | ORAL_TABLET | Freq: Every day | ORAL | Status: DC

## 2016-01-22 NOTE — Patient Instructions (Signed)
Please schedule a time next week to drop by and get your blood pressure checked.   6 months of your medications were sent to the pharmacy. If you feel light headed- hold the amlodipine and call us.

## 2016-01-22 NOTE — Progress Notes (Signed)
Patient ID: Zachary Santos, male    DOB: January 17, 1936  Age: 80 y.o. MRN: [id number]  CC: Follow-up; Medication Refill; and Possible Prostate issues   HPI Zachary Santos presents for follow up of BPH, nocturia, prostate concerns. He also needs medication refills and a 90 day supply today.   1) Prostate concerns- has documentation of BPH Nocturia- denies multiple episodes  Weak stream  on Cadura some relief at this time, but is wondering if it can be upped  2) Medication refills- wants 90 day supply   He would like Norvasc, CAD dura, lisinopril, triamcinolone ointment  3) patient has not heard from dermatology referral we will talk with referral coordinators today.  History Zachary Santos has a past medical history of Chronic fatigue; Stable angina (HCC); COPD (chronic obstructive pulmonary disease) ([place]); Essential hypertension; CKD (chronic kidney disease), stage III; Paget's disease; Tobacco abuse; Osteoarthritis; Type II diabetes mellitus ([place]); Syncope; and Claudication ([place]).   He has past surgical history that includes Spine surgery; Appendectomy; laparscopic chole; and trigeminal neuralgia.   His family history includes COPD in his father; Cancer in his mother; Diabetes in his brother.He reports that he has been smoking Cigarettes.  He has a 90 pack-year smoking history. He uses smokeless tobacco. He reports that he drinks alcohol. He reports that he does not use illicit drugs.  Outpatient Prescriptions Prior to Visit  Medication Sig Dispense Refill  . aspirin 81 MG EC tablet Take 1 tablet (81 mg total) by mouth daily. 30 tablet 6  . glipiZIDE (GLUCOTROL XL) 2.5 MG 24 hr tablet Take 1 tablet (2.5 mg total) by mouth daily with breakfast. 90 tablet 1  . glucose blood test strip Test at least once daily. OneTouch Mini 50 each 11  . amLODipine (NORVASC) 5 MG tablet Take 1 tablet (5 mg total) by mouth daily. 90 tablet 3  . doxazosin (CARDURA) 2 MG tablet TAKE TWO TABLETS BY MOUTH AT BEDTIME 180  tablet 3  . lisinopril (PRINIVIL,ZESTRIL) 10 MG tablet Take 1 tablet (10 mg total) by mouth daily. 30 tablet 3  . TRIAMCINOLONE ACETONIDE, TOP, 0.05 % OINT Apply 1 application topically 2 (two) times daily. 17 g 2   No facility-administered medications prior to visit.    ROS Review of Systems  Constitutional: Negative for fever, chills, diaphoresis and fatigue.  Eyes: Negative for visual disturbance.  Respiratory: Negative for chest tightness, shortness of breath and wheezing.   Cardiovascular: Negative for chest pain, palpitations and leg swelling.  Genitourinary: Positive for decreased urine volume and difficulty urinating. Negative for dysuria, urgency, frequency, hematuria, flank pain, discharge, penile swelling, scrotal swelling, enuresis, genital sores, penile pain and testicular pain.  Skin: Positive for color change.       Multiple areas of color changes to skin on face and neck as well as arms  Neurological: Negative for dizziness.  Psychiatric/Behavioral: The patient is not nervous/anxious.     Objective:  BP 126/68 mmHg  Pulse 60  Temp(Src) 97.5 F (36.4 C) (Oral)  Ht 6' (1.829 m)  Wt 197 lb (89.359 kg)  BMI 26.71 kg/m2  SpO2 96%  Physical Exam  Constitutional: He is oriented to person, place, and time. He appears well-developed and well-nourished. No distress.  HENT:  Head: Normocephalic and atraumatic.  Right Ear: External ear normal.  Left Ear: External ear normal.  Eyes: Right eye exhibits no discharge. Left eye exhibits no discharge. No scleral icterus.  Cardiovascular: Normal rate and regular rhythm.   Pulmonary/Chest: Effort normal  and breath sounds normal. No respiratory distress. He has no wheezes. He has no rales. He exhibits no tenderness.  Genitourinary:  Deferred rectal exam today  Neurological: He is alert and oriented to person, place, and time.  Skin: Skin is warm and dry. No rash noted. He is not diaphoretic.  Psychiatric: He has a normal mood  and affect. His behavior is normal. Judgment and thought content normal.    Assessment & Plan:   Zachary Santos was seen today for follow-up, medication refill and possible prostate issues.  Diagnoses and all orders for this visit:  Atypical nevi  Other orders -     doxazosin (CARDURA) 4 MG tablet; Take 1 tablet (4 mg total) by mouth daily. -     amLODipine (NORVASC) 2.5 MG tablet; Take 1 tablet (2.5 mg total) by mouth daily. -     lisinopril (PRINIVIL,ZESTRIL) 10 MG tablet; Take 1 tablet (10 mg total) by mouth daily.  I have discontinued [name] amLODipine, doxazosin, and TRIAMCINOLONE ACETONIDE (TOP). I am also having him start on doxazosin and amLODipine. Additionally, I am having him maintain his aspirin, glucose blood, glipiZIDE, triamcinolone ointment, and lisinopril.  Meds ordered this encounter  Medications  . triamcinolone ointment (KENALOG) 0.1 %    Sig: One application topically twice daily as needed  . doxazosin (CARDURA) 4 MG tablet    Sig: Take 1 tablet (4 mg total) by mouth daily.    Dispense:  90 tablet    Refill:  1    Order Specific Question:  Supervising Provider    Answer:  Deborra Medina L [[id number]]  . amLODipine (NORVASC) 2.5 MG tablet    Sig: Take 1 tablet (2.5 mg total) by mouth daily.    Dispense:  90 tablet    Refill:  1    Order Specific Question:  Supervising Provider    Answer:  Deborra Medina L [[id number]]  . lisinopril (PRINIVIL,ZESTRIL) 10 MG tablet    Sig: Take 1 tablet (10 mg total) by mouth daily.    Dispense:  90 tablet    Refill:  1    Order Specific Question:  Supervising Provider    Answer:  Crecencio Mc [[id number]]     Follow-up: Return in about 1 week (around 01/29/2016) for BP follow up Nurse visit .

## 2016-01-22 NOTE — Progress Notes (Signed)
Pre visit review using our clinic review tool, if applicable. No additional management support is needed unless otherwise documented below in the visit note. 

## 2016-01-30 NOTE — Assessment & Plan Note (Signed)
BP Readings from Last 3 Encounters:  01/22/16 126/68  10/22/15 148/76  07/28/15 122/60   Patient is stable at this time we are cutting his Norvasc down to 2.5 mg and upping his CAD dura to 4 mg for concurrent BPH use Follow-up in one week for nurse visit with BP check

## 2016-01-30 NOTE — Assessment & Plan Note (Addendum)
Speaking with referral coordinators today to get set up for dermatology

## 2016-01-30 NOTE — Assessment & Plan Note (Signed)
We are upping his doxazosin to 4 mg from 2 mg Decreasing amlodipine to 2.5 mg

## 2016-02-02 DIAGNOSIS — L570 Actinic keratosis: Secondary | ICD-10-CM | POA: Diagnosis not present

## 2016-02-02 DIAGNOSIS — D485 Neoplasm of uncertain behavior of skin: Secondary | ICD-10-CM | POA: Diagnosis not present

## 2016-03-08 DIAGNOSIS — L570 Actinic keratosis: Secondary | ICD-10-CM | POA: Diagnosis not present

## 2016-04-19 ENCOUNTER — Ambulatory Visit (INDEPENDENT_AMBULATORY_CARE_PROVIDER_SITE_OTHER): Payer: Medicare Other | Admitting: Family Medicine

## 2016-04-19 ENCOUNTER — Encounter: Payer: Self-pay | Admitting: Family Medicine

## 2016-04-19 VITALS — BP 132/70 | HR 56 | Temp 97.8°F | Ht 72.0 in | Wt 191.4 lb

## 2016-04-19 DIAGNOSIS — M889 Osteitis deformans of unspecified bone: Secondary | ICD-10-CM

## 2016-04-19 DIAGNOSIS — D649 Anemia, unspecified: Secondary | ICD-10-CM | POA: Diagnosis not present

## 2016-04-19 DIAGNOSIS — I10 Essential (primary) hypertension: Secondary | ICD-10-CM

## 2016-04-19 DIAGNOSIS — E1159 Type 2 diabetes mellitus with other circulatory complications: Secondary | ICD-10-CM | POA: Diagnosis not present

## 2016-04-19 DIAGNOSIS — N400 Enlarged prostate without lower urinary tract symptoms: Secondary | ICD-10-CM

## 2016-04-19 DIAGNOSIS — E119 Type 2 diabetes mellitus without complications: Secondary | ICD-10-CM

## 2016-04-19 DIAGNOSIS — E785 Hyperlipidemia, unspecified: Secondary | ICD-10-CM

## 2016-04-19 LAB — CBC
HCT: 41.5 % (ref 39.0–52.0)
Hemoglobin: 13.8 g/dL (ref 13.0–17.0)
MCHC: 33.3 g/dL (ref 30.0–36.0)
MCV: 91 fl (ref 78.0–100.0)
Platelets: 166 10*3/uL (ref 150.0–400.0)
RBC: 4.56 Mil/uL (ref 4.22–5.81)
RDW: 14.8 % (ref 11.5–15.5)
WBC: 7.2 10*3/uL (ref 4.0–10.5)

## 2016-04-19 LAB — COMPREHENSIVE METABOLIC PANEL
ALT: 20 U/L (ref 0–53)
AST: 17 U/L (ref 0–37)
Albumin: 4.1 g/dL (ref 3.5–5.2)
Alkaline Phosphatase: 264 U/L — ABNORMAL HIGH (ref 39–117)
BUN: 14 mg/dL (ref 6–23)
CO2: 30 mEq/L (ref 19–32)
Calcium: 9.5 mg/dL (ref 8.4–10.5)
Chloride: 106 mEq/L (ref 96–112)
Creatinine, Ser: 1.32 mg/dL (ref 0.40–1.50)
GFR: 55.79 mL/min — ABNORMAL LOW (ref >60.00)
Glucose, Bld: 148 mg/dL — ABNORMAL HIGH (ref 70–99)
Potassium: 4.4 mEq/L (ref 3.5–5.1)
Sodium: 137 mEq/L (ref 135–145)
Total Bilirubin: 0.5 mg/dL (ref 0.2–1.2)
Total Protein: 6.8 g/dL (ref 6.0–8.3)

## 2016-04-19 LAB — LIPID PANEL
Cholesterol: 129 mg/dL (ref 0–200)
HDL: 26.3 mg/dL — ABNORMAL LOW (ref >39.00)
LDL Cholesterol: 78 mg/dL (ref 0–99)
NonHDL: 102.43
Total CHOL/HDL Ratio: 5
Triglycerides: 123 mg/dL (ref 0.0–149.0)
VLDL: 24.6 mg/dL (ref 0.0–40.0)

## 2016-04-19 LAB — HEMOGLOBIN A1C: Hgb A1c MFr Bld: 6.9 % — ABNORMAL HIGH (ref 4.6–6.5)

## 2016-04-19 MED ORDER — TAMSULOSIN HCL 0.4 MG PO CAPS
0.4000 mg | ORAL_CAPSULE | Freq: Every day | ORAL | Status: DC
Start: 2016-04-19 — End: 2016-08-14

## 2016-04-19 MED ORDER — AMLODIPINE BESYLATE 2.5 MG PO TABS: 2.5000 mg | ORAL_TABLET | Freq: Every day | ORAL | Status: DC

## 2016-04-19 MED ORDER — LISINOPRIL 10 MG PO TABS: 10.0000 mg | ORAL_TABLET | Freq: Every day | ORAL | Status: DC

## 2016-04-19 MED ORDER — GLIPIZIDE ER 2.5 MG PO TB24
2.5000 mg | ORAL_TABLET | Freq: Every day | ORAL | Status: DC
Start: 2016-04-19 — End: 2017-09-19

## 2016-04-19 NOTE — Progress Notes (Signed)
Subjective:  Patient ID: Zachary Santos, male    DOB: [birth year]  Age: 80 y.o. MRN: [id number]  CC: Follow up  HPI:  80 year old male with hypertension, DM 2, dyslipidemia, BPH, CKD, COPD, and tobacco abuse presents for follow-up.  DM  Blood sugars readings - 97-105.  Hypoglycemia - no.  Medications - glipizide  Adverse effects - none.  Compliance - yes. Preventative care  Eye exam - in need of eye exam. Encouraged him to get this.  Foot exam - up to date.  Last A1C - In need of today.  Urine microalbumin - On ACE.  HTN  Stable on Lisinopril & amlodipine (cardura as well).  Needs refills today.  HLD  Well controlled w/o pharmacotherapy.  Needs labs today.  BPH  Having issues with dripping and difficulty getting stream started.  On Cardura.  Social Hx   Social History   Social History  . Marital Status: Married    Spouse Name: N/A  . Number of Children: N/A  . Years of Education: N/A   Social History Main Topics  . Smoking status: Current Every Day Smoker -- 1.50 packs/day for 60 years    Types: Cigarettes  . Smokeless tobacco: Current User  . Alcohol Use: 0.0 oz/week    0 Standard drinks or equivalent per week     Comment: rare - "1% of the time."  . Drug Use: No  . Sexual Activity: Not on file   Other Topics Concern  . Not on file   Social History Narrative   Lives in [place] with wife.  Retired Administrator.  Very sedentary.  Avoids activity b/c he "gives out" and is limited by right hip/buttock discomfort/claudication.   Review of Systems  Constitutional: Negative.   Genitourinary: Positive for difficulty urinating.   Objective:  BP 132/70 mmHg  Pulse 56  Temp(Src) 97.8 F (36.6 C) (Oral)  Ht 6' (1.829 m)  Wt 191 lb 6.4 oz (86.818 kg)  BMI 25.95 kg/m2  SpO2 96%  BP/Weight 04/19/2016 05/22/2777 11/22/2351  Systolic BP 614 431 540  Diastolic BP 70 68 76  Wt. (Lbs) 191.4 197 191  BMI 25.95 26.71 25.9   Physical Exam    Constitutional: He is oriented to person, place, and time. He appears well-developed. No distress.  Cardiovascular: Normal rate and regular rhythm.   Pulmonary/Chest: Effort normal.  Decreased breath sounds.  Neurological: He is alert and oriented to person, place, and time.  Psychiatric: He has a normal mood and affect.  Vitals reviewed.  Lab Results  Component Value Date   WBC 7.5 03/24/2015   HGB 12.8* 03/24/2015   HCT 38.1* 03/24/2015   PLT 147* 03/24/2015   GLUCOSE 126* 07/28/2015   CHOL 119 04/25/2015   TRIG 132.0 04/25/2015   HDL 22.10* 04/25/2015   LDLCALC 71 04/25/2015   ALT 19 07/28/2015   AST 19 07/28/2015   NA 139 07/28/2015   K 4.3 07/28/2015   CL 103 07/28/2015   CREATININE 1.51* 07/28/2015   BUN 19 07/28/2015   CO2 29 07/28/2015   TSH 1.44 08/06/2014   PSA 2.50 07/20/2012   INR 1.1 02/03/2008   HGBA1C 6.6* 07/28/2015   MICROALBUR 5.58* 02/02/2010   Assessment & Plan:   Problem List Items Addressed This Visit    Dyslipidemia    Stable. Lipid panel today.      Essential hypertension, benign    Well controlled. Continue Lisinopril and Norvasc. Labs today.      Relevant  Medications   lisinopril (PRINIVIL,ZESTRIL) 10 MG tablet   amLODipine (NORVASC) 2.5 MG tablet   Paget's disease of bone    Offered to discuss treatment and patient declined.      BPH (benign prostatic hyperplasia)    Uncontrolled. Switching Cardura to Flomax.      Relevant Medications   tamsulosin (FLOMAX) 0.4 MG CAPS capsule   Diabetes type 2, controlled ([place]) - Primary    Est problem, well controlled. A1C today. Continue Glipizide. Refilled today.      Relevant Medications   lisinopril (PRINIVIL,ZESTRIL) 10 MG tablet   glipiZIDE (GLUCOTROL XL) 2.5 MG 24 hr tablet   Other Relevant Orders   Comp Met (CMET)   Lipid Profile   HgB A1c    Other Visit Diagnoses    Anemia, unspecified anemia type        Relevant Orders    CBC       Meds ordered this encounter   Medications  . lisinopril (PRINIVIL,ZESTRIL) 10 MG tablet    Sig: Take 1 tablet (10 mg total) by mouth daily.    Dispense:  90 tablet    Refill:  1  . amLODipine (NORVASC) 2.5 MG tablet    Sig: Take 1 tablet (2.5 mg total) by mouth daily.    Dispense:  90 tablet    Refill:  1  . glipiZIDE (GLUCOTROL XL) 2.5 MG 24 hr tablet    Sig: Take 1 tablet (2.5 mg total) by mouth daily with breakfast.    Dispense:  90 tablet    Refill:  1  . tamsulosin (FLOMAX) 0.4 MG CAPS capsule    Sig: Take 1 capsule (0.4 mg total) by mouth daily.    Dispense:  90 capsule    Refill:  0   Follow-up: 6 months.  [place]

## 2016-04-19 NOTE — Patient Instructions (Signed)
Stop the cardura.  Start the flomax.  Follow up in 6 months.  Take care  Dr. Lacinda Axon

## 2016-04-19 NOTE — Assessment & Plan Note (Signed)
Est problem, well controlled. A1C today. Continue Glipizide. Refilled today.

## 2016-04-19 NOTE — Assessment & Plan Note (Signed)
Uncontrolled. Switching Cardura to Flomax.

## 2016-04-19 NOTE — Assessment & Plan Note (Signed)
Offered to discuss treatment and patient declined.

## 2016-04-19 NOTE — Assessment & Plan Note (Signed)
Well controlled. Continue Lisinopril and Norvasc. Labs today.

## 2016-04-19 NOTE — Assessment & Plan Note (Signed)
Stable. Lipid panel today.

## 2016-05-05 ENCOUNTER — Other Ambulatory Visit: Payer: Self-pay | Admitting: Nurse Practitioner

## 2016-05-06 NOTE — Telephone Encounter (Signed)
I refilled test strips. Please advise on kenalog refill.  Thank you.

## 2016-07-21 ENCOUNTER — Observation Stay: Payer: Medicare Other

## 2016-07-21 ENCOUNTER — Emergency Department: Payer: Medicare Other

## 2016-07-21 ENCOUNTER — Encounter: Payer: Self-pay | Admitting: Emergency Medicine

## 2016-07-21 ENCOUNTER — Observation Stay
Admission: EM | Admit: 2016-07-21 | Discharge: 2016-07-22 | Disposition: A | Discharge status: Home / Self Care | Payer: Medicare Other | Attending: Internal Medicine | Admitting: Internal Medicine

## 2016-07-21 DIAGNOSIS — I10 Essential (primary) hypertension: Secondary | ICD-10-CM

## 2016-07-21 DIAGNOSIS — R739 Hyperglycemia, unspecified: Secondary | ICD-10-CM

## 2016-07-21 DIAGNOSIS — R8281 Pyuria: Secondary | ICD-10-CM

## 2016-07-21 DIAGNOSIS — R079 Chest pain, unspecified: Principal | ICD-10-CM

## 2016-07-21 DIAGNOSIS — F17210 Nicotine dependence, cigarettes, uncomplicated: Secondary | ICD-10-CM | POA: Insufficient documentation

## 2016-07-21 DIAGNOSIS — N400 Enlarged prostate without lower urinary tract symptoms: Secondary | ICD-10-CM | POA: Insufficient documentation

## 2016-07-21 DIAGNOSIS — J449 Chronic obstructive pulmonary disease, unspecified: Secondary | ICD-10-CM | POA: Insufficient documentation

## 2016-07-21 DIAGNOSIS — Z7982 Long term (current) use of aspirin: Secondary | ICD-10-CM | POA: Insufficient documentation

## 2016-07-21 DIAGNOSIS — R51 Headache: Secondary | ICD-10-CM | POA: Diagnosis not present

## 2016-07-21 DIAGNOSIS — M6281 Muscle weakness (generalized): Secondary | ICD-10-CM

## 2016-07-21 DIAGNOSIS — E1122 Type 2 diabetes mellitus with diabetic chronic kidney disease: Secondary | ICD-10-CM | POA: Insufficient documentation

## 2016-07-21 DIAGNOSIS — Z7984 Long term (current) use of oral hypoglycemic drugs: Secondary | ICD-10-CM | POA: Diagnosis not present

## 2016-07-21 DIAGNOSIS — Z9114 Patient's other noncompliance with medication regimen: Secondary | ICD-10-CM | POA: Diagnosis not present

## 2016-07-21 DIAGNOSIS — R0789 Other chest pain: Secondary | ICD-10-CM | POA: Diagnosis not present

## 2016-07-21 DIAGNOSIS — I674 Hypertensive encephalopathy: Secondary | ICD-10-CM | POA: Diagnosis not present

## 2016-07-21 DIAGNOSIS — E1165 Type 2 diabetes mellitus with hyperglycemia: Secondary | ICD-10-CM | POA: Insufficient documentation

## 2016-07-21 DIAGNOSIS — I129 Hypertensive chronic kidney disease with stage 1 through stage 4 chronic kidney disease, or unspecified chronic kidney disease: Secondary | ICD-10-CM | POA: Diagnosis not present

## 2016-07-21 DIAGNOSIS — Z79899 Other long term (current) drug therapy: Secondary | ICD-10-CM | POA: Diagnosis not present

## 2016-07-21 DIAGNOSIS — R410 Disorientation, unspecified: Secondary | ICD-10-CM | POA: Diagnosis not present

## 2016-07-21 DIAGNOSIS — R519 Headache, unspecified: Secondary | ICD-10-CM

## 2016-07-21 DIAGNOSIS — N183 Chronic kidney disease, stage 3 (moderate): Secondary | ICD-10-CM | POA: Diagnosis not present

## 2016-07-21 LAB — COMPREHENSIVE METABOLIC PANEL
ALT: 21 U/L (ref 17–63)
AST: 22 U/L (ref 15–41)
Albumin: 4.4 g/dL (ref 3.5–5.0)
Alkaline Phosphatase: 281 U/L — ABNORMAL HIGH (ref 38–126)
Anion gap: 7 (ref 5–15)
BUN: 13 mg/dL (ref 6–20)
CO2: 27 mmol/L (ref 22–32)
Calcium: 9.3 mg/dL (ref 8.9–10.3)
Chloride: 104 mmol/L (ref 101–111)
Creatinine, Ser: 1.35 mg/dL — ABNORMAL HIGH (ref 0.61–1.24)
GFR calc Af Amer: 56 mL/min — ABNORMAL LOW (ref >60)
GFR calc non Af Amer: 49 mL/min — ABNORMAL LOW (ref >60)
Glucose, Bld: 161 mg/dL — ABNORMAL HIGH (ref 65–99)
Potassium: 4.3 mmol/L (ref 3.5–5.1)
Sodium: 138 mmol/L (ref 135–145)
Total Bilirubin: 0.3 mg/dL (ref 0.3–1.2)
Total Protein: 7.4 g/dL (ref 6.5–8.1)

## 2016-07-21 LAB — CBC
HCT: 47.8 % (ref 40.0–52.0)
HCT: 43 % (ref 40.0–52.0)
Hemoglobin: 15.8 g/dL (ref 13.0–18.0)
Hemoglobin: 15.2 g/dL (ref 13.0–18.0)
MCH: 30.3 pg (ref 26.0–34.0)
MCH: 31.4 pg (ref 26.0–34.0)
MCHC: 33.2 g/dL (ref 32.0–36.0)
MCHC: 35.3 g/dL (ref 32.0–36.0)
MCV: 91.5 fL (ref 80.0–100.0)
MCV: 89.2 fL (ref 80.0–100.0)
Platelets: 181 10*3/uL (ref 150–440)
Platelets: 171 10*3/uL (ref 150–440)
RBC: 5.22 MIL/uL (ref 4.40–5.90)
RBC: 4.82 MIL/uL (ref 4.40–5.90)
RDW: 13.7 % (ref 11.5–14.5)
RDW: 13.9 % (ref 11.5–14.5)
WBC: 6.4 10*3/uL (ref 3.8–10.6)
WBC: 7.4 10*3/uL (ref 3.8–10.6)

## 2016-07-21 LAB — URINALYSIS COMPLETE WITH MICROSCOPIC (ARMC ONLY)
Bacteria, UA: NONE SEEN
Bilirubin Urine: NEGATIVE
Glucose, UA: NEGATIVE mg/dL
Hgb urine dipstick: NEGATIVE
Ketones, ur: NEGATIVE mg/dL
Nitrite: NEGATIVE
Protein, ur: NEGATIVE mg/dL
Specific Gravity, Urine: 1.012 (ref 1.005–1.030)
pH: 6 (ref 5.0–8.0)

## 2016-07-21 LAB — TROPONIN I
Troponin I: <0.03 ng/mL (ref <0.03)
Troponin I: <0.03 ng/mL (ref <0.03)
Troponin I: <0.03 ng/mL (ref <0.03)
Troponin I: <0.03 ng/mL (ref <0.03)

## 2016-07-21 LAB — CREATININE, SERUM
Creatinine, Ser: 1.25 mg/dL — ABNORMAL HIGH (ref 0.61–1.24)
GFR calc Af Amer: >60 mL/min (ref >60)
GFR calc non Af Amer: 53 mL/min — ABNORMAL LOW (ref >60)

## 2016-07-21 LAB — TSH: TSH: 1.347 u[IU]/mL (ref 0.350–4.500)

## 2016-07-21 LAB — BRAIN NATRIURETIC PEPTIDE: B Natriuretic Peptide: 68 pg/mL (ref 0.0–100.0)

## 2016-07-21 LAB — MAGNESIUM: Magnesium: 1.9 mg/dL (ref 1.7–2.4)

## 2016-07-21 LAB — GLUCOSE, CAPILLARY
Glucose-Capillary: 136 mg/dL — ABNORMAL HIGH (ref 65–99)
Glucose-Capillary: 74 mg/dL (ref 65–99)

## 2016-07-21 MED ORDER — LISINOPRIL 20 MG PO TABS
20.0000 mg | ORAL_TABLET | Freq: Two times a day (BID) | ORAL | Status: DC
  Administered 2016-07-21 – 2016-07-22 (×2): 20 mg via ORAL
  Filled 2016-07-21 (×2): qty 1

## 2016-07-21 MED ORDER — INSULIN ASPART 100 UNIT/ML ~~LOC~~ SOLN: 0.0000 [IU] | Freq: Every day | SUBCUTANEOUS | Status: DC

## 2016-07-21 MED ORDER — NICOTINE 21 MG/24HR TD PT24
21.0000 mg | MEDICATED_PATCH | Freq: Every day | TRANSDERMAL | Status: DC
  Administered 2016-07-21: 21 mg via TRANSDERMAL
  Filled 2016-07-21: qty 1

## 2016-07-21 MED ORDER — ALBUTEROL SULFATE (2.5 MG/3ML) 0.083% IN NEBU
5.0000 mg | INHALATION_SOLUTION | Freq: Once | RESPIRATORY_TRACT | Status: AC
  Administered 2016-07-21: 5 mg via RESPIRATORY_TRACT
  Filled 2016-07-21: qty 6

## 2016-07-21 MED ORDER — SODIUM CHLORIDE 0.9% FLUSH: 3.0000 mL | INTRAVENOUS | Status: DC | PRN

## 2016-07-21 MED ORDER — SODIUM CHLORIDE 0.9 % IV BOLUS (SEPSIS)
1000.0000 mL | Freq: Once | INTRAVENOUS | Status: AC
  Administered 2016-07-21: 1000 mL via INTRAVENOUS

## 2016-07-21 MED ORDER — ONDANSETRON HCL 4 MG/2ML IJ SOLN
4.0000 mg | Freq: Four times a day (QID) | INTRAMUSCULAR | Status: DC | PRN
  Administered 2016-07-21: 4 mg via INTRAVENOUS
  Filled 2016-07-21: qty 2

## 2016-07-21 MED ORDER — TAMSULOSIN HCL 0.4 MG PO CAPS
0.4000 mg | ORAL_CAPSULE | Freq: Every day | ORAL | Status: DC
  Administered 2016-07-22: 0.4 mg via ORAL
  Filled 2016-07-21: qty 1

## 2016-07-21 MED ORDER — GLIPIZIDE ER 2.5 MG PO TB24
2.5000 mg | ORAL_TABLET | Freq: Every day | ORAL | Status: DC
  Administered 2016-07-22: 2.5 mg via ORAL
  Filled 2016-07-21: qty 1

## 2016-07-21 MED ORDER — NITROGLYCERIN 0.4 MG SL SUBL: 0.4000 mg | SUBLINGUAL_TABLET | SUBLINGUAL | Status: DC | PRN

## 2016-07-21 MED ORDER — ASPIRIN EC 81 MG PO TBEC
81.0000 mg | DELAYED_RELEASE_TABLET | Freq: Every day | ORAL | Status: DC
  Administered 2016-07-22: 81 mg via ORAL
  Filled 2016-07-21: qty 1

## 2016-07-21 MED ORDER — ASPIRIN 81 MG PO CHEW
243.0000 mg | CHEWABLE_TABLET | Freq: Once | ORAL | Status: AC
  Administered 2016-07-21: 243 mg via ORAL
  Filled 2016-07-21: qty 3

## 2016-07-21 MED ORDER — ENOXAPARIN SODIUM 40 MG/0.4ML ~~LOC~~ SOLN
40.0000 mg | SUBCUTANEOUS | Status: DC
  Administered 2016-07-21: 40 mg via SUBCUTANEOUS
  Filled 2016-07-21: qty 0.4

## 2016-07-21 MED ORDER — METOPROLOL TARTRATE 25 MG PO TABS
25.0000 mg | ORAL_TABLET | Freq: Two times a day (BID) | ORAL | Status: DC
  Administered 2016-07-21 (×2): 25 mg via ORAL
  Filled 2016-07-21 (×2): qty 1

## 2016-07-21 MED ORDER — ALBUTEROL SULFATE (2.5 MG/3ML) 0.083% IN NEBU: 2.5000 mg | INHALATION_SOLUTION | RESPIRATORY_TRACT | Status: DC | PRN

## 2016-07-21 MED ORDER — NITROGLYCERIN 2 % TD OINT
1.0000 [in_us] | TOPICAL_OINTMENT | Freq: Four times a day (QID) | TRANSDERMAL | Status: DC
  Administered 2016-07-21 – 2016-07-22 (×4): 1 [in_us] via TOPICAL
  Filled 2016-07-21 (×4): qty 1

## 2016-07-21 MED ORDER — INSULIN ASPART 100 UNIT/ML ~~LOC~~ SOLN
4.0000 [IU] | Freq: Three times a day (TID) | SUBCUTANEOUS | Status: DC
  Administered 2016-07-21 – 2016-07-22 (×2): 4 [IU] via SUBCUTANEOUS
  Filled 2016-07-21 (×2): qty 4

## 2016-07-21 MED ORDER — ONDANSETRON HCL 4 MG PO TABS: 4.0000 mg | ORAL_TABLET | Freq: Four times a day (QID) | ORAL | Status: DC | PRN

## 2016-07-21 MED ORDER — TRIAMCINOLONE ACETONIDE 0.1 % EX OINT
TOPICAL_OINTMENT | Freq: Two times a day (BID) | CUTANEOUS | Status: DC
  Administered 2016-07-22: 13:00:00 via TOPICAL
  Filled 2016-07-21: qty 15

## 2016-07-21 MED ORDER — SODIUM CHLORIDE 0.9% FLUSH
3.0000 mL | Freq: Two times a day (BID) | INTRAVENOUS | Status: DC
  Administered 2016-07-21: 3 mL via INTRAVENOUS

## 2016-07-21 MED ORDER — ATORVASTATIN CALCIUM 20 MG PO TABS
40.0000 mg | ORAL_TABLET | Freq: Every day | ORAL | Status: DC
  Administered 2016-07-21: 40 mg via ORAL
  Filled 2016-07-21: qty 2

## 2016-07-21 MED ORDER — SODIUM CHLORIDE 0.9 % IV SOLN: 250.0000 mL | INTRAVENOUS | Status: DC | PRN

## 2016-07-21 MED ORDER — AMLODIPINE BESYLATE 5 MG PO TABS: 5.0000 mg | ORAL_TABLET | Freq: Every day | ORAL | Status: DC

## 2016-07-21 MED ORDER — AMLODIPINE BESYLATE 5 MG PO TABS: 5.0000 mg | ORAL_TABLET | Freq: Once | ORAL | Status: DC

## 2016-07-21 MED ORDER — SODIUM CHLORIDE 0.9% FLUSH
3.0000 mL | Freq: Two times a day (BID) | INTRAVENOUS | Status: DC
  Administered 2016-07-21 (×2): 3 mL via INTRAVENOUS

## 2016-07-21 MED ORDER — INSULIN ASPART 100 UNIT/ML ~~LOC~~ SOLN
0.0000 [IU] | Freq: Three times a day (TID) | SUBCUTANEOUS | Status: DC
  Administered 2016-07-21 – 2016-07-22 (×2): 2 [IU] via SUBCUTANEOUS
  Filled 2016-07-21 (×2): qty 2

## 2016-07-21 NOTE — ED Notes (Signed)
[place] sandwich tray provided per admitting MD.

## 2016-07-21 NOTE — ED Triage Notes (Signed)
POV , headache, weakness, cheat pain, "I can not think right" ,

## 2016-07-21 NOTE — Progress Notes (Signed)
In to have consent signed. Pt reading education on stress test given to him. Pt looking at risks of tests and stated that would rather have exercise stress test. Will notify nuclear and MD on rounds.

## 2016-07-21 NOTE — H&P (Signed)
[place] [facility] [place] NAME: Zachary Santos    MR#:  [id number]  DATE OF BIRTH:  [birth year]  DATE OF ADMISSION:  07/21/2016  PRIMARY CARE PHYSICIAN: Zachary Spikes, DO   REQUESTING/REFERRING PHYSICIAN:   CHIEF COMPLAINT:   Chief Complaint  Patient presents with  . Chest Pain    HISTORY OF PRESENT ILLNESS: Zachary Santos  is a 80 y.o. male with a known history of Chronic fatigue, CK D stage III, COPD, essential hypertension, osteoarthritis, tobacco abuse of 2 packs per day for 60+ years, diabetes mellitus type 2, who presents to the hospital with complaints of not feeling well. When the patient, he was doing well up until 8 AM today when he woke up, he started having headache, continue with that headache represents elevated blood pressure, you check his blood pressure in stool was high at 190/90. He took his blood pressure medications. However, his blood pressure remains very high, patient became very weak and disoriented. He also noted left upper chest pain, described as throbbing, constant pain 3 out of 10 by intensity, lasting approximately one hour. Patient was weak and confused and was brought to emergency room for further evaluation and treatment. His EKG revealed T depressions in inferior leads, however, his troponin was negative. Hospitalist services were contacted for admission.  Apparently patient had chest pains in the past, was recommended to get stress test done by Dr. Fletcher Anon, which was scheduled sometime in 2016, however he missed it.   PAST MEDICAL HISTORY:   Past Medical History:  Diagnosis Date  . Chronic fatigue   . CKD (chronic kidney disease), stage III   . Claudication [facility])    a. R Hip.  [name] COPD (chronic obstructive pulmonary disease) ([place])   . Essential hypertension    a. 01/2008 Echo: EF 65%, no rwma, mod increased PASP.  [name] Osteoarthritis   . Paget's disease   . Syncope    a. 03/2015 in setting of hypotension.  . Tobacco  abuse    a. 1-1.5 ppd x 60+ yrs.  . Type II diabetes mellitus ([place])     PAST SURGICAL HISTORY: Past Surgical History:  Procedure Laterality Date  . APPENDECTOMY    . laparscopic chole    . SPINE SURGERY     cervical spine surgery  . trigeminal neuralgia     with surgery in [place] HISTORY:  Social History  Substance Use Topics  . Smoking status: Current Every Day Smoker    Packs/day: 1.50    Years: 60.00    Types: Cigarettes  . Smokeless tobacco: Current User  . Alcohol use 0.0 oz/week     Comment: rare - "1% of the time."    FAMILY HISTORY:  Family History  Problem Relation Age of Onset  . Cancer Mother     died @ 53.  [name] COPD Father     died @ 96.  . Diabetes Brother     died in early 56's.    DRUG ALLERGIES: No Known Allergies  Review of Systems  Constitutional: Negative for chills, fever and weight loss.  HENT: Negative for congestion.   Eyes: Negative for blurred vision and double vision.  Respiratory: Positive for cough, sputum production, shortness of breath and wheezing.   Cardiovascular: Positive for chest pain. Negative for palpitations, orthopnea, leg swelling and PND.  Gastrointestinal: Positive for nausea. Negative for abdominal pain, blood in stool, constipation, diarrhea and vomiting.  Genitourinary: Negative for dysuria, frequency, hematuria and urgency.  Musculoskeletal: Negative for falls.  Neurological: Negative for dizziness, tremors, focal weakness and headaches.  Endo/Heme/Allergies: Does not bruise/bleed easily.  Psychiatric/Behavioral: Negative for depression. The patient does not have insomnia.     MEDICATIONS AT HOME:  Prior to Admission medications   Medication Sig Start Date End Date Taking? Authorizing Provider  amLODipine (NORVASC) 2.5 MG tablet Take 1 tablet (2.5 mg total) by mouth daily. 04/19/16  Yes Zachary Spikes, DO  aspirin 81 MG EC tablet Take 1 tablet (81 mg total) by mouth daily. 04/18/14  Yes Nischal Dareen Piano,  MD  glipiZIDE (GLUCOTROL XL) 2.5 MG 24 hr tablet Take 1 tablet (2.5 mg total) by mouth daily with breakfast. 04/19/16  Yes Zachary Spikes, DO  lisinopril (PRINIVIL,ZESTRIL) 10 MG tablet Take 1 tablet (10 mg total) by mouth daily. 04/19/16  Yes Zachary Spikes, DO  tamsulosin (FLOMAX) 0.4 MG CAPS capsule Take 1 capsule (0.4 mg total) by mouth daily. 04/19/16  Yes Zachary Spikes, DO  triamcinolone ointment (KENALOG) 0.1 % APPLY  OINTMENT TOPICALLY TWICE DAILY 05/06/16  Yes Zachary Spikes, DO  ONE TOUCH ULTRA TEST test strip USE TEST STRIP TO CHECK GLUCOSE AT LEAST ONCE DAILY 05/06/16   Zachary Spikes, DO      PHYSICAL EXAMINATION:   VITAL SIGNS: Blood pressure (!) 163/73, pulse 81, temperature 97.4 F (36.3 C), temperature source Oral, resp. rate 14, height 6' (1.829 m), weight 88.5 kg (195 lb), SpO2 96 %.  GENERAL:  80 y.o.-year-old patient lying in the bed with no acute distress. Smells tobacco EYES: Pupils equal, round, reactive to light and accommodation. No scleral icterus. Extraocular muscles intact.  HEENT: Head atraumatic, normocephalic. Oropharynx and nasopharynx clear.  NECK:  Supple, no jugular venous distention. No thyroid enlargement, no tenderness.  LUNGS: Markedly diminished breath sounds bilaterally, no wheezing, rales,rhonchi or crepitation. No use of accessory muscles of respiration.  CARDIOVASCULAR: S1, S2 distant. Rhythm was regular. No murmurs, rubs, or gallops.  ABDOMEN: Soft, nontender, nondistended. Bowel sounds present. No organomegaly or mass.  EXTREMITIES: Trace to 1+ lower extremity and pedal edema, more pronounced in the right lower extremity, no cyanosis, or clubbing.  NEUROLOGIC: Cranial nerves II through XII are intact. Muscle strength 5/5 in all extremities. Sensation intact. Gait not checked.  PSYCHIATRIC: The patient is alert and oriented x 3.  SKIN: No obvious rash, lesion, or ulcer.   LABORATORY PANEL:   CBC  Recent Labs Lab 07/21/16 1126  WBC 6.4  HGB 15.8  HCT  47.8  PLT 181  MCV 91.5  MCH 30.3  MCHC 33.2  RDW 13.7   ------------------------------------------------------------------------------------------------------------------  Chemistries   Recent Labs Lab 07/21/16 1126  NA 138  K 4.3  CL 104  CO2 27  GLUCOSE 161*  BUN 13  CREATININE 1.35*  CALCIUM 9.3  MG 1.9  AST 22  ALT 21  ALKPHOS 281*  BILITOT 0.3   ------------------------------------------------------------------------------------------------------------------  Cardiac Enzymes  Recent Labs Lab 07/21/16 1126  TROPONINI <0.03   ------------------------------------------------------------------------------------------------------------------  RADIOLOGY: Ct Head Wo Contrast  Result Date: 07/21/2016 CLINICAL DATA:  Headache.  Mental status changes.  Diabetes. EXAM: CT HEAD WITHOUT CONTRAST TECHNIQUE: Contiguous axial images were obtained from the base of the skull through the vertex without intravenous contrast. COMPARISON:  08/05/2014 FINDINGS: Brain: Moderate low density in the periventricular white matter likely related to small vessel disease. Left cerebellar hypoattenuation on image 8/series 2 is felt to be similar to  on the prior exam. No hemorrhage, mass lesion, intra-axial, or extra-axial fluid collection Vascular: No hyperdense vessel or unexpected calcification. Skull: No significant soft tissue swelling. Left occipital craniotomy Sinuses/Orbits: Normal orbits and globes. Clear paranasal sinuses and mastoid air cells. Other: None IMPRESSION: 1.  No acute intracranial abnormality. 2. Mild to moderate small vessel ischemic change. 3. Subtle left cerebellar hypoattenuation is felt to be similar. Likely related to remote insult. Concurrent left occipital craniotomy. Electronically Signed   By: Abigail Miyamoto M.D.   On: 07/21/2016 11:09   Dg Chest Portable 1 View  Result Date: 07/21/2016 CLINICAL DATA:  Headache, weakness, chest pain EXAM: PORTABLE CHEST 1 VIEW  COMPARISON:  03/23/2015 FINDINGS: The heart size and mediastinal contours are within normal limits. Both lungs are clear. The visualized skeletal structures are unremarkable. IMPRESSION: No active disease. Electronically Signed   By: Kathreen Devoid   On: 07/21/2016 11:23    EKG: Orders placed or performed during the hospital encounter of 07/21/16  . ED EKG  . ED EKG  . EKG 12-Lead  . EKG 12-Lead  . EKG 12-Lead  . EKG 12-Lead  EKG in emergency room revealed a sinus rhythm at 65 bpm, left axis deviation, borderline T depression in inferior leads, no acute ST-T changes, otherwise was noted  IMPRESSION AND PLAN:  Principal Problem:   Malignant essential hypertension Active Problems:   Hypertensive encephalopathy   Chest pain   Tobacco abuse counseling   Hyperglycemia   Pyuria #1. Malignant essential hypertension, admit patient to medical floor, initiate him on the high doses of blood pressure medications, follow blood pressure readings closely, add nitroglycerin topically #2. Chest pain, check cardiac enzymes, sensory, continue patient on metoprolol, aspirin, nitroglycerin and Lovenox subcutaneously, get stress test in the morning #3. Hypertensive encephalopathy, head CT reveals subtle left cerebellar change, questionable related to remote insult. Get MRI of the brain to rule out acute stroke #4. CK D stage III, seems to be stable, follow with therapy #5. Diabetes mellitus type 2, continue outpatient medications, sliding scale insulin, getting hemoglobin A1c #6. Tobacco abuse counseling, discussed this patient for 5 minutes, nicotine replacement therapy will be initiated, patient is agreeable   All the records are reviewed and case discussed with ED provider. Management plans discussed with the patient, family and they are in agreement.  CODE STATUS: Code Status History    Date Active Date Inactive Code Status Order ID Comments User Context   03/23/2015  6:37 PM 03/25/2015  5:49 PM DNR  [id number]  Bettey Costa, MD Inpatient    Questions for Most Recent Historical Code Status (Order [id number])    Question Answer Comment   In the event of cardiac or respiratory ARREST Do not call a "code blue"    In the event of cardiac or respiratory ARREST Do not perform Intubation, CPR, defibrillation or ACLS    In the event of cardiac or respiratory ARREST Use medication by any route, position, wound care, and other measures to relive pain and suffering. May use oxygen, suction and manual treatment of airway obstruction as needed for comfort.        TOTAL TIME TAKING CARE OF THIS PATIENT: 50 minutes.    Theodoro Grist M.D on 07/21/2016 at 1:26 PM  Between 7am to 6pm - Pager - [phone] After 6pm go to [email or web address] - password [email or web address] [facility]  Office  [phone]  CC: Primary care physician; Zachary Spikes, DO

## 2016-07-21 NOTE — Care Management Obs Status (Signed)
[place] NOTIFICATION   Patient Details  Name: Zachary Santos MRN: [id number] Date of Birth: 11-10-1935   Medicare Observation Status Notification Given:  Yes    Katrina Stack, RN 07/21/2016, 4:57 PM

## 2016-07-21 NOTE — ED Notes (Signed)
Patient transported to CT 

## 2016-07-21 NOTE — ED Notes (Signed)
Pt reports he is unable to lay flat without vomiting; Dr. Clayton Bibles notified.  Will pre-medicate prior to MRI.

## 2016-07-21 NOTE — ED Provider Notes (Signed)
[facility] Emergency Department Provider Note    First MD Initiated Contact with Patient 07/21/16 1055     (approximate)  I have reviewed the triage vital signs and the nursing notes.   HISTORY  Chief Complaint Chest Pain    HPI KEVAN SIBBALD is a 80 y.o. male with a history of COPD as well as the poor controlled hypertension, CK D, one and a half packs per day smoker, diabetes and peripheral vascular disease presents with 1 day of left upper chest pain and pressure rated 5 out of 10 associated with headache, confusion and nausea. Patient states that he was working with his cousin on the house renovation project and had worsening pain. States there is no pain with exertion. Does not have any chest pain right now. Denies ever having this kind of discomfort before. States he does have a history of headaches and states the headache feels similar to previous. Denies any neck pain. Denies any fevers. Does have a history of COPD and consistently smokes. Does not have any fevers. No worsening cough.   Past Medical History:  Diagnosis Date  . Chronic fatigue   . CKD (chronic kidney disease), stage III   . Claudication [facility])    a. R Hip.  [name] COPD (chronic obstructive pulmonary disease) ([place])   . Essential hypertension    a. 01/2008 Echo: EF 65%, no rwma, mod increased PASP.  [name] Osteoarthritis   . Paget's disease   . Syncope    a. 03/2015 in setting of hypotension.  . Tobacco abuse    a. 1-1.5 ppd x 60+ yrs.  . Type II diabetes mellitus [facility])     Patient Active Problem List   Diagnosis Date Noted  . Atypical nevi 10/27/2015  . Exertional dyspnea 06/20/2015  . Right leg claudication ([place]) 06/20/2015  . Generalized anxiety disorder 04/25/2015  . Syncope   . CKD (chronic kidney disease), stage III   . Diabetes type 2, controlled ([place]) 09/12/2013  . Osteoarthritis of right knee 06/07/2013  . Neoplasm of uncertain behavior of skin 06/28/2011  . Healthcare  maintenance 06/28/2011  . 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 BPH (benign prostatic hyperplasia) 02/02/2010  . Dyslipidemia 03/13/2008  . TOBACCO ABUSE 02/05/2008  . Essential hypertension, benign 02/05/2008  . COPD (chronic obstructive pulmonary disease) ([place]) 02/05/2008  . Paget's disease of bone 02/05/2008    Past Surgical History:  Procedure Laterality Date  . APPENDECTOMY    . laparscopic chole    . SPINE SURGERY     cervical spine surgery  . trigeminal neuralgia     with surgery in [place]    Prior to Admission medications   Medication Sig Start Date End Date Taking? Authorizing Provider  amLODipine (NORVASC) 2.5 MG tablet Take 1 tablet (2.5 mg total) by mouth daily. 04/19/16  Yes [name], DO  aspirin 81 MG EC tablet Take 1 tablet (81 mg total) by mouth daily. 04/18/14  Yes Nischal Dareen Piano, MD  glipiZIDE (GLUCOTROL XL) 2.5 MG 24 hr tablet Take 1 tablet (2.5 mg total) by mouth daily with breakfast. 04/19/16  Yes [name], DO  lisinopril (PRINIVIL,ZESTRIL) 10 MG tablet Take 1 tablet (10 mg total) by mouth daily. 04/19/16  Yes [name], DO  tamsulosin (FLOMAX) 0.4 MG CAPS capsule Take 1 capsule (0.4 mg total) by mouth daily. 04/19/16  Yes Jayce G Cook, DO  triamcinolone ointment (KENALOG) 0.1 % APPLY  OINTMENT TOPICALLY TWICE DAILY 05/06/16  Yes [name], DO  ONE TOUCH ULTRA TEST test strip USE TEST STRIP TO CHECK GLUCOSE AT LEAST ONCE DAILY 05/06/16   [name], DO    Allergies Review of patient's allergies indicates no known allergies.  Family History  Problem Relation Age of Onset  . Cancer Mother     died @ 62.  [name] COPD Father     died @ 8.  . Diabetes Brother     died in early 32's.    Social History Social History  Substance Use Topics  . Smoking status: Current Every Day Smoker    Packs/day: 1.50    Years: 60.00    Types: Cigarettes  . Smokeless tobacco: Current User  . Alcohol use 0.0 oz/week     Comment: rare - "1% of the time."     Review of Systems Patient denies headaches, rhinorrhea, blurry vision, numbness, shortness of breath, chest pain, edema, cough, abdominal pain, nausea, vomiting, diarrhea, dysuria, fevers, rashes or hallucinations unless otherwise stated above in HPI. ____________________________________________   PHYSICAL EXAM:  VITAL SIGNS: Vitals:   07/21/16 1105 07/21/16 1134  BP: (!) 180/97 (!) 167/79  Pulse: 61 62  Resp: 12 17  Temp:      Constitutional: Alert and oriented. Well appearing and in no acute distress. Eyes: Conjunctivae are normal. PERRL. EOMI. Head: Atraumatic. Nose: No congestion/rhinnorhea. Mouth/Throat: Mucous membranes are moist.  Oropharynx non-erythematous. Neck: No stridor. Painless ROM. No cervical spine tenderness to palpation Hematological/Lymphatic/Immunilogical: No cervical lymphadenopathy. Cardiovascular: Normal rate, regular rhythm. Grossly normal heart sounds.  Good peripheral circulation. Respiratory: Normal respiratory effort.  No retractions. Diminished breath  sounds bilaterally Gastrointestinal: Soft and nontender. No distention. No abdominal bruits. No CVA tenderness. Genitourinary:  Musculoskeletal: No lower extremity tenderness nor edema.  No joint effusions. Neurologic:  Normal speech and language. No gross focal neurologic deficits are appreciated. No gait instability. Skin:  Skin is warm, dry and intact. No rash noted. Psychiatric: Mood and affect are normal. Speech and behavior are normal.  ____________________________________________   LABS (all labs ordered are listed, but only abnormal results are displayed)  Results for orders placed or performed during the hospital encounter of 07/21/16 (from the past 24 hour(s))  CBC     Status: None   Collection Time: 07/21/16 11:26 AM  Result Value Ref Range   WBC 6.4 3.8 - 10.6 K/uL   RBC 5.22 4.40 - 5.90 MIL/uL   Hemoglobin 15.8 13.0 - 18.0 g/dL   HCT 47.8 40.0 - 52.0 %   MCV 91.5 80.0 - 100.0  fL   MCH 30.3 26.0 - 34.0 pg   MCHC 33.2 32.0 - 36.0 g/dL   RDW 13.7 11.5 - 14.5 %   Platelets 181 150 - 440 K/uL  Urinalysis complete, with microscopic     Status: Abnormal   Collection Time: 07/21/16 11:26 AM  Result Value Ref Range   Color, Urine YELLOW (A) YELLOW   APPearance CLEAR (A) CLEAR   Glucose, UA NEGATIVE NEGATIVE mg/dL   Bilirubin Urine NEGATIVE NEGATIVE   Ketones, ur NEGATIVE NEGATIVE mg/dL   Specific Gravity, Urine 1.012 1.005 - 1.030   Hgb urine dipstick NEGATIVE NEGATIVE   pH 6.0 5.0 - 8.0   Protein, ur NEGATIVE NEGATIVE mg/dL   Nitrite NEGATIVE NEGATIVE   Leukocytes, UA 1+ (A) NEGATIVE   RBC / HPF 0-5 0 - 5 RBC/hpf   WBC, UA 6-30 0 - 5 WBC/hpf   Bacteria, UA NONE SEEN NONE SEEN  Squamous Epithelial / LPF 0-5 (A) NONE SEEN   Mucous PRESENT   Comprehensive metabolic panel     Status: Abnormal   Collection Time: 07/21/16 11:26 AM  Result Value Ref Range   Sodium 138 135 - 145 mmol/L   Potassium 4.3 3.5 - 5.1 mmol/L   Chloride 104 101 - 111 mmol/L   CO2 27 22 - 32 mmol/L   Glucose, Bld 161 (H) 65 - 99 mg/dL   BUN 13 6 - 20 mg/dL   Creatinine, Ser 1.35 (H) 0.61 - 1.24 mg/dL   Calcium 9.3 8.9 - 10.3 mg/dL   Total Protein 7.4 6.5 - 8.1 g/dL   Albumin 4.4 3.5 - 5.0 g/dL   AST 22 15 - 41 U/L   ALT 21 17 - 63 U/L   Alkaline Phosphatase 281 (H) 38 - 126 U/L   Total Bilirubin 0.3 0.3 - 1.2 mg/dL   GFR calc non Af Amer 49 (L) >60 mL/min   GFR calc Af Amer 56 (L) >60 mL/min   Anion gap 7 5 - 15  Troponin I     Status: None   Collection Time: 07/21/16 11:26 AM  Result Value Ref Range   Troponin I <0.03 <0.03 ng/mL   ____________________________________________  EKG My review and personal interpretation at Time: 10:42   Indication: chest pain  Rate: 65  Rhythm: nsr Axis: normal Other: non specific st and t wave changes, most predominant in inferior leads, no acute STEMI  Repeat ekg my review and personal interpretation at time  11:06 shows no STEMI and no  significant changes ____________________________________________  RADIOLOGY  I personally reviewed all radiographic images ordered to evaluate for the above acute complaints and reviewed radiology reports and findings.  These findings were personally discussed with the patient.  Please see medical record for radiology report.  ____________________________________________   PROCEDURES  Procedure(s) performed: none    Critical Care performed: no ____________________________________________   INITIAL IMPRESSION / ASSESSMENT AND PLAN / ED COURSE  Pertinent labs & imaging results that were available during my care of the patient were reviewed by me and considered in my medical decision making (see chart for details).  DDX: ACS, pericarditis, esophagitis, boerhaaves, pe, cor pulmonale, dissection, pna, bronchitis, costochondritis   Normand Doll Bausman is a 80 y.o. who presents to the ED with above complaints. Patient rates afebrile and hypertensive and currently chest pain-free. EKG is concerning with T-wave inversions in inferior leads but does not meet criteria for STEMI. Repeat EKG shows no delta changes. Presentation is concerning for ACS.  Ct head ordered to evaluate for her headache shows no acute abnormality's. CT shows no acute abnormality. Patient currently not complaining of any headache. Not the worse headache in his life. Not consistent with infectious process or subarachnoid hemorrhage.  I am more concerned about the patient's complaint of chest discomfort. The patient will be placed on continuous pulse oximetry and telemetry for monitoring.  Laboratory evaluation will be sent to evaluate for the above complaints.     Clinical Course  Comment By Time  Review of medical records patient was scheduled to have an outpatient Lexiscan back in November 2016 but declined due to being asymptomatic. Has not had further follow-up with cardiology. Merlyn Lot, MD 10/04 1135  Initial  troponin negative. We'll continue with medical management at this time. Spoke with hospitalist regarding admission for further evaluation and management. Merlyn Lot, MD 10/04 1251     ____________________________________________   FINAL CLINICAL IMPRESSION(S) /  ED DIAGNOSES  Final diagnoses:  Headache  Chest pain, unspecified type      NEW MEDICATIONS STARTED DURING THIS VISIT:  New Prescriptions   No medications on file     Note:  This document was prepared using Dragon voice recognition software and may include unintentional dictation errors.    Merlyn Lot, MD 07/21/16 1254

## 2016-07-21 NOTE — ED Notes (Signed)
MRI notified that pt is willing to follow through with MRI as long as he is pre-medicated.  Tonya w/ MRI asked to let him know that exam will likely be performed in the morning.  Will pass message along to pt.

## 2016-07-22 ENCOUNTER — Encounter (HOSPITAL_BASED_OUTPATIENT_CLINIC_OR_DEPARTMENT_OTHER): Payer: Medicare Other

## 2016-07-22 ENCOUNTER — Telehealth: Payer: Self-pay

## 2016-07-22 ENCOUNTER — Other Ambulatory Visit: Payer: Self-pay | Admitting: Radiology

## 2016-07-22 DIAGNOSIS — R0789 Other chest pain: Secondary | ICD-10-CM

## 2016-07-22 DIAGNOSIS — I10 Essential (primary) hypertension: Secondary | ICD-10-CM | POA: Diagnosis not present

## 2016-07-22 DIAGNOSIS — R079 Chest pain, unspecified: Secondary | ICD-10-CM | POA: Diagnosis not present

## 2016-07-22 DIAGNOSIS — N183 Chronic kidney disease, stage 3 (moderate): Secondary | ICD-10-CM | POA: Diagnosis not present

## 2016-07-22 LAB — CBC
HCT: 38.2 % — ABNORMAL LOW (ref 40.0–52.0)
Hemoglobin: 13.2 g/dL (ref 13.0–18.0)
MCH: 31.3 pg (ref 26.0–34.0)
MCHC: 34.6 g/dL (ref 32.0–36.0)
MCV: 90.4 fL (ref 80.0–100.0)
Platelets: 155 10*3/uL (ref 150–440)
RBC: 4.23 MIL/uL — ABNORMAL LOW (ref 4.40–5.90)
RDW: 14 % (ref 11.5–14.5)
WBC: 8.5 10*3/uL (ref 3.8–10.6)

## 2016-07-22 LAB — HEMOGLOBIN A1C
Hgb A1c MFr Bld: 7 % — ABNORMAL HIGH (ref 4.8–5.6)
Mean Plasma Glucose: 154 mg/dL

## 2016-07-22 LAB — BASIC METABOLIC PANEL
Anion gap: 6 (ref 5–15)
BUN: 19 mg/dL (ref 6–20)
CO2: 26 mmol/L (ref 22–32)
Calcium: 8.6 mg/dL — ABNORMAL LOW (ref 8.9–10.3)
Chloride: 107 mmol/L (ref 101–111)
Creatinine, Ser: 1.36 mg/dL — ABNORMAL HIGH (ref 0.61–1.24)
GFR calc Af Amer: 56 mL/min — ABNORMAL LOW (ref >60)
GFR calc non Af Amer: 48 mL/min — ABNORMAL LOW (ref >60)
Glucose, Bld: 129 mg/dL — ABNORMAL HIGH (ref 65–99)
Potassium: 4.4 mmol/L (ref 3.5–5.1)
Sodium: 139 mmol/L (ref 135–145)

## 2016-07-22 LAB — LIPID PANEL
Cholesterol: 126 mg/dL (ref 0–200)
HDL: 24 mg/dL — ABNORMAL LOW (ref >40)
LDL Cholesterol: 67 mg/dL (ref 0–99)
Total CHOL/HDL Ratio: 5.3 RATIO
Triglycerides: 174 mg/dL — ABNORMAL HIGH (ref <150)
VLDL: 35 mg/dL (ref 0–40)

## 2016-07-22 LAB — GLUCOSE, CAPILLARY
Glucose-Capillary: 143 mg/dL — ABNORMAL HIGH (ref 65–99)
Glucose-Capillary: 148 mg/dL — ABNORMAL HIGH (ref 65–99)

## 2016-07-22 LAB — NM MYOCAR MULTI W/SPECT W/WALL MOTION / EF: SRS: 4

## 2016-07-22 LAB — TROPONIN I
Troponin I: <0.03 ng/mL (ref <0.03)
Troponin I: <0.03 ng/mL (ref <0.03)

## 2016-07-22 MED ORDER — LISINOPRIL 10 MG PO TABS: 20.0000 mg | ORAL_TABLET | Freq: Every day | ORAL | 1 refills | Status: DC

## 2016-07-22 MED ORDER — AMLODIPINE BESYLATE 10 MG PO TABS
10.0000 mg | ORAL_TABLET | Freq: Every day | ORAL | Status: DC
  Administered 2016-07-22: 10 mg via ORAL
  Filled 2016-07-22: qty 1

## 2016-07-22 MED ORDER — AMLODIPINE BESYLATE 2.5 MG PO TABS: 10.0000 mg | ORAL_TABLET | Freq: Every day | ORAL | 1 refills | Status: DC

## 2016-07-22 MED ORDER — METOPROLOL TARTRATE 25 MG PO TABS: 12.5000 mg | ORAL_TABLET | Freq: Two times a day (BID) | ORAL | Status: DC

## 2016-07-22 MED ORDER — TECHNETIUM TC 99M TETROFOSMIN IV KIT: 33.0000 | PACK | Freq: Once | INTRAVENOUS | Status: DC | PRN

## 2016-07-22 MED ORDER — TECHNETIUM TC 99M TETROFOSMIN IV KIT
12.8810 | PACK | Freq: Once | INTRAVENOUS | Status: AC | PRN
  Administered 2016-07-22: 12.881 via INTRAVENOUS

## 2016-07-22 NOTE — Progress Notes (Signed)
    Patient attempted treadmill Myoview this morning after previously refusing Lexiscan. He was able to achieve a max heart rate of 111 bpm (target 120 bpm). He developed severe LE cramps in stage 2/2 and instability leading to emergent stopping of the treadmill. He was never close to falling. Again discussed Lexiscan with him and he continues to refuse this test. Advised him we will not be able to fully evaluate him without this test, he states, "so be it." He was transferred back to his room is stable condition.

## 2016-07-22 NOTE — Discharge Summary (Signed)
Zachary Santos NAME: Zachary Santos    MR#:  [id number]  DATE OF BIRTH:  1935-12-03  DATE OF ADMISSION:  07/21/2016 ADMITTING PHYSICIAN: Theodoro Grist, MD  DATE OF DISCHARGE: 07/22/16 PRIMARY CARE PHYSICIAN: [name], DO    ADMISSION DIAGNOSIS:  Hypertensive encephalopathy [I67.4] Chest pain [R07.9] Headache [R51] Chest pain, unspecified type [R07.9]  DISCHARGE DIAGNOSIS:  Malignant HTN DM-2 Chest pain -treadmill stress test neg  SECONDARY DIAGNOSIS:   Past Medical History:  Diagnosis Date  . Chronic fatigue   . CKD (chronic kidney disease), stage III   . Claudication [facility])    a. R Hip.  [name] COPD (chronic obstructive pulmonary disease) ([place])   . Essential hypertension    a. 01/2008 Echo: EF 65%, no rwma, mod increased PASP.  [name] Osteoarthritis   . Paget's disease   . Syncope    a. 03/2015 in setting of hypotension.  . Tobacco abuse    a. 1-1.5 ppd x 60+ yrs.  . Type II diabetes mellitus [facility])     HOSPITAL COURSE:  Zachary Santos  is a 80 y.o. male with a known history of Chronic fatigue, CK D stage III, COPD, essential hypertension, osteoarthritis, tobacco abuse of 2 packs per day for 60+ years, diabetes mellitus type 2, who presents to the hospital with complaints of not feeling well. When the patient, he was doing well up until 8 AM today when he woke up, he started having headache, continue with that headache represents elevated blood pressure  1. Malignant essential hypertension -bp now better -Increased lisinopril and amlodipine dose -hold BB since low HR  #2. Chest pain, -resolved -Partial stress test negative  #3. Hypertensive encephalopathy, head CT reveals subtle left cerebellar change -MRI brain negative -issues with non compliance with meds at home per dter  #4. CK D stage III, seems to be stable  #5. Diabetes mellitus type 2, continue outpatient medications, sliding scale insulin  #6. Tobacco  abuse counseling, discussed this patient for 5 minutes, nicotine replacement therapy will be initiated, patient is agreeable  Overall stable  D/c home D/w dter and pt CONSULTS OBTAINED:  Treatment Team:  Wellington Hampshire, MD  DRUG ALLERGIES:  No Known Allergies  DISCHARGE MEDICATIONS:   Current Discharge Medication List    CONTINUE these medications which have CHANGED   Details  amLODipine (NORVASC) 2.5 MG tablet Take 4 tablets (10 mg total) by mouth daily. Qty: 90 tablet, Refills: 1    lisinopril (PRINIVIL,ZESTRIL) 10 MG tablet Take 2 tablets (20 mg total) by mouth daily. Qty: 90 tablet, Refills: 1      CONTINUE these medications which have NOT CHANGED   Details  aspirin 81 MG EC tablet Take 1 tablet (81 mg total) by mouth daily. Qty: 30 tablet, Refills: 6   Associated Diagnoses: Essential hypertension, benign    glipiZIDE (GLUCOTROL XL) 2.5 MG 24 hr tablet Take 1 tablet (2.5 mg total) by mouth daily with breakfast. Qty: 90 tablet, Refills: 1   Associated Diagnoses: Controlled type 2 diabetes mellitus with other circulatory complication, without long-term current use of insulin (HCC)    tamsulosin (FLOMAX) 0.4 MG CAPS capsule Take 1 capsule (0.4 mg total) by mouth daily. Qty: 90 capsule, Refills: 0    triamcinolone ointment (KENALOG) 0.1 % APPLY  OINTMENT TOPICALLY TWICE DAILY Qty: 15 g, Refills: 0    ONE TOUCH ULTRA TEST test strip USE TEST STRIP TO CHECK GLUCOSE AT LEAST ONCE  DAILY Qty: 50 each, Refills: 11        If you experience worsening of your admission symptoms, develop shortness of breath, life threatening emergency, suicidal or homicidal thoughts you must seek medical attention immediately by calling 911 or calling your MD immediately  if symptoms less severe.  You Must read complete instructions/literature along with all the possible adverse reactions/side effects for all the Medicines you take and that have been prescribed to you. Take any new Medicines  after you have completely understood and accept all the possible adverse reactions/side effects.   Please note  You were cared for by a hospitalist during your hospital stay. If you have any questions about your discharge medications or the care you received while you were in the hospital after you are discharged, you can call the unit and asked to speak with the hospitalist on call if the hospitalist that took care of you is not available. Once you are discharged, your primary care physician will handle any further medical issues. Please note that NO REFILLS for any discharge medications will be authorized once you are discharged, as it is imperative that you return to your primary care physician (or establish a relationship with a primary care physician if you do not have one) for your aftercare needs so that they can reassess your need for medications and monitor your lab values. Today   SUBJECTIVE   Doing ok  VITAL SIGNS:  Blood pressure (!) 143/69, pulse (!) 52, temperature 97.6 F (36.4 C), temperature source Oral, resp. rate 19, height 6' (1.829 m), weight 86.7 kg (191 lb 3.2 oz), SpO2 97 %.  I/O:   Intake/Output Summary (Last 24 hours) at 07/22/16 1344 Last data filed at 07/22/16 0201  Gross per 24 hour  Intake              240 ml  Output              700 ml  Net             -460 ml    PHYSICAL EXAMINATION:  GENERAL:  80 y.o.-year-old patient lying in the bed with no acute distress.  EYES: Pupils equal, round, reactive to light and accommodation. No scleral icterus. Extraocular muscles intact.  HEENT: Head atraumatic, normocephalic. Oropharynx and nasopharynx clear.  NECK:  Supple, no jugular venous distention. No thyroid enlargement, no tenderness.  LUNGS: Normal breath sounds bilaterally, no wheezing, rales,rhonchi or crepitation. No use of accessory muscles of respiration.  CARDIOVASCULAR: S1, S2 normal. No murmurs, rubs, or gallops.  ABDOMEN: Soft, non-tender,  non-distended. Bowel sounds present. No organomegaly or mass.  EXTREMITIES: No pedal edema, cyanosis, or clubbing.  NEUROLOGIC: Cranial nerves II through XII are intact. Muscle strength 5/5 in all extremities. Sensation intact. Gait not checked.  PSYCHIATRIC: The patient is alert and oriented x 3.  SKIN: No obvious rash, lesion, or ulcer.   DATA REVIEW:   CBC   Recent Labs Lab 07/22/16 0532  WBC 8.5  HGB 13.2  HCT 38.2*  PLT 155    Chemistries   Recent Labs Lab 07/21/16 1126  07/22/16 0532  NA 138  --  139  K 4.3  --  4.4  CL 104  --  107  CO2 27  --  26  GLUCOSE 161*  --  129*  BUN 13  --  19  CREATININE 1.35*  < > 1.36*  CALCIUM 9.3  --  8.6*  MG 1.9  --   --  AST 22  --   --   ALT 21  --   --   ALKPHOS 281*  --   --   BILITOT 0.3  --   --   < > = values in this interval not displayed.  Microbiology Results   No results found for this or any previous visit (from the past 240 hour(s)).  RADIOLOGY:  Ct Head Wo Contrast  Result Date: 07/21/2016 CLINICAL DATA:  Headache.  Mental status changes.  Diabetes. EXAM: CT HEAD WITHOUT CONTRAST TECHNIQUE: Contiguous axial images were obtained from the base of the skull through the vertex without intravenous contrast. COMPARISON:  08/05/2014 FINDINGS: Brain: Moderate low density in the periventricular white matter likely related to small vessel disease. Left cerebellar hypoattenuation on image 8/series 2 is felt to be similar to on the prior exam. No hemorrhage, mass lesion, intra-axial, or extra-axial fluid collection Vascular: No hyperdense vessel or unexpected calcification. Skull: No significant soft tissue swelling. Left occipital craniotomy Sinuses/Orbits: Normal orbits and globes. Clear paranasal sinuses and mastoid air cells. Other: None IMPRESSION: 1.  No acute intracranial abnormality. 2. Mild to moderate small vessel ischemic change. 3. Subtle left cerebellar hypoattenuation is felt to be similar. Likely related to  remote insult. Concurrent left occipital craniotomy. Electronically Signed   By: Abigail Miyamoto M.D.   On: 07/21/2016 11:09   Mr Brain Wo Contrast  Result Date: 07/21/2016 CLINICAL DATA:  Weakness and confusion, worsening today. Headache, hypertension, diabetes, and tobacco abuse. EXAM: MRI HEAD WITHOUT CONTRAST TECHNIQUE: Multiplanar, multiecho pulse sequences of the brain and surrounding structures were obtained without intravenous contrast. COMPARISON:  CT head 07/21/2016. FINDINGS: Brain: No evidence for acute infarction, hemorrhage, mass lesion, hydrocephalus, or extra-axial fluid. Moderate cerebral and cerebellar atrophy. Mild subcortical and periventricular T2 and FLAIR hyperintensities, likely chronic microvascular ischemic change. Chronic changes of LEFT cerebellar encephalomalacia and hemorrhage, likely related to a suboccipital craniectomy defect. It is unclear if the cerebellar abnormality was the primary reason for the craniectomy, or was a complication of such. Vascular: Flow voids are maintained. Skull and upper cervical spine: Normal marrow signal. Sinuses/Orbits: No sinus or mastoid disease.  Negative orbits. Other: None. IMPRESSION: Chronic changes as described. Atrophy and small vessel disease. Old LEFT suboccipital craniectomy. No acute intracranial findings. Electronically Signed   By: Staci Righter M.D.   On: 07/21/2016 16:55   Nm Myocar Multi W/spect W/wall Motion / Ef  Result Date: 07/22/2016  There was no ST segment deviation noted during stress.  Non diagnostic study. The patient achieved only 78% on MPH. He stopped exercising due to leg cramps. He refused Lexiscan. No stress images were done.  Normal rest images.    Dg Chest Portable 1 View  Result Date: 07/21/2016 CLINICAL DATA:  Headache, weakness, chest pain EXAM: PORTABLE CHEST 1 VIEW COMPARISON:  03/23/2015 FINDINGS: The heart size and mediastinal contours are within normal limits. Both lungs are clear. The visualized  skeletal structures are unremarkable. IMPRESSION: No active disease. Electronically Signed   By: Kathreen Devoid   On: 07/21/2016 11:23     Management plans discussed with the patient, family and they are in agreement.  CODE STATUS:     Code Status Orders        Start     Ordered   07/21/16 1409  Full code  Continuous     07/21/16 1408    Code Status History    Date Active Date Inactive Code Status Order ID Comments User Context  03/23/2015  6:37 PM 03/25/2015  5:49 PM DNR [id number]  Bettey Costa, MD Inpatient      TOTAL TIME TAKING CARE OF THIS PATIENT: 40 minutes.    Haide Klinker M.D on 07/22/2016 at 1:44 PM  Between 7am to 6pm - Pager - [phone] After 6pm go to [email or web address] - password [email or web address] [place] [facility]  Office  [phone]  CC: Primary care physician; [name], DO

## 2016-07-22 NOTE — Evaluation (Signed)
Physical Therapy Evaluation Patient Details Name: Zachary Santos MRN: [id number] DOB: [birth year] Today's Date: 07/22/2016   History of Present Illness   80 y.o. male with a known history of Chronic fatigue, CK D stage III, COPD, essential hypertension, osteoarthritis, tobacco abuse of 2 packs per day for 60+ years, diabetes mellitus type 2, who presents to the hospital with complaints of not feeling well.  Pt was having chest pain and hypertension, feeling better at time of PT exam.  Clinical Impression  Pt shows good strength, balance, confidence, speed, etc with all tested activities.  His O2 and HR remain stable with the effort and overall he had no issues that would necessitate further PT intervention.  Pt is safe to return home regarding physical therapy.    Follow Up Recommendations No PT follow up    Equipment Recommendations       Recommendations for Other Services       Precautions / Restrictions Precautions Precautions: None Restrictions Weight Bearing Restrictions: No      Mobility  Bed Mobility Overal bed mobility: Independent                Transfers Overall transfer level: Independent Equipment used: None             General transfer comment: Pt is able to rise to standing w/o assist and shows good confidence and balance  Ambulation/Gait Ambulation/Gait assistance: Independent Ambulation Distance (Feet): 250 Feet Assistive device: None       General Gait Details: Pt walks with good speed and confidence.  Has no LOBs or safety issues, c/o of only very minimal fatigue and overall did very well.  Stairs Stairs: Yes Stairs assistance: Independent Stair Management: One rail Left Number of Stairs: 6 General stair comments: Pt easily and confidently negotiates up/down steps w/o issue.  Wheelchair Mobility    Modified Rankin (Stroke Patients Only)       Balance Overall balance assessment: Independent                                            Pertinent Vitals/Pain Pain Assessment: No/denies pain    Home Living Family/patient expects to be discharged to:: Private residence Living Arrangements: Spouse/significant other     Home Access: Stairs to enter   Technical brewer of Steps: 5          Prior Function Level of Independence: Independent         Comments: Pt drives, runs errands, mows lawn, etc     Hand Dominance        Extremity/Trunk Assessment   Upper Extremity Assessment: Overall WFL for tasks assessed           Lower Extremity Assessment: Overall WFL for tasks assessed         Communication   Communication: No difficulties  Cognition Arousal/Alertness: Awake/alert Behavior During Therapy: WFL for tasks assessed/performed Overall Cognitive Status: Within Functional Limits for tasks assessed                      General Comments      Exercises     Assessment/Plan    PT Assessment Patent does not need any further PT services  PT Problem List            PT Treatment Interventions      PT Goals (Current goals can  be found in the Care Plan section)  Acute Rehab PT Goals Patient Stated Goal: go home    Frequency     Barriers to discharge        Co-evaluation               End of Session Equipment Utilized During Treatment: Gait belt Activity Tolerance: Patient tolerated treatment well Patient left: in chair;with call bell/phone within reach      Functional Assessment Tool Used: clinical judgement Functional Limitation: Mobility: Walking and moving around Mobility: Walking and Moving Around Current Status [phone]): 0 percent impaired, limited or restricted Mobility: Walking and Moving Around Goal Status [phone]): 0 percent impaired, limited or restricted Mobility: Walking and Moving Around Discharge Status [phone]): 0 percent impaired, limited or restricted    Time: 1010-1022 PT Time Calculation (min) (ACUTE ONLY): 12  min   Charges:   PT Evaluation $PT Eval Low Complexity: 1 Procedure     PT G Codes:   PT G-Codes **NOT FOR INPATIENT CLASS** Functional Assessment Tool Used: clinical judgement Functional Limitation: Mobility: Walking and moving around Mobility: Walking and Moving Around Current Status [id number]): 0 percent impaired, limited or restricted Mobility: Walking and Moving Around Goal Status [phone]): 0 percent impaired, limited or restricted Mobility: Walking and Moving Around Discharge Status [id number]): 0 percent impaired, limited or restricted    Kreg Shropshire, DPT 07/22/2016, 11:21 AM

## 2016-07-22 NOTE — Consult Note (Signed)
Cardiology Consultation Note  Patient ID: Zachary Santos, MRN: [id number], DOB/AGE: [birth year] 80 y.o. Admit date: 07/21/2016   Date of Consult: 07/22/2016 Primary Physician: [name], DO Primary Cardiologist: Dr. Fletcher Anon, MD Requesting Physician: Dr. Ether Griffins, MD  Chief Complaint: Weakness Reason for Consult: Same  HPI: 80 y.o. male with h/o HTN, DM2, prolonged tobacco abuse and prior episode of syncope felt to he 2/2 hypotension in the setting of dehydration in 2016 who presented to the hospital on 10/4 with malaise and fatigue.   He was hospitalized in June after he had a syncopal episode. It happened a day after he moved a refrigerator in a hot weather. He was sitting at his computer when he felt dizzy and lightheaded. He checked his blood pressure and was 77/52. He had a brief loss of consciousness for about 30 seconds. He was nauseated and diaphoretic. EMS were called and upon arrival to the emergency room, his blood pressure was 82/50 with a temperature of 95 Fahrenheit. Was initially thought that he might be septic. However, it appears that he was volume depleted and improved with hydration. He had an echocardiogram which showed normal LV systolic function with mildly dilated left atrium. When he was last seen in teh office 1 year ago he continued to note some dizziness with positional changes as well as dyspnea with minimal exertion. He continued to smoke. A nuclear stress test was ordered at that time, unfortunately the patient cancelled his test, did not reschedule, and did not follow up afterwards. There were also concerns for possible claudication-like symptoms noted in 2016, though the patient never followed through with his LE studies.   He presented to [facility] on 10/4 with complaints of "not feeling well" as well as some generalized confusion. Felt just like he did back in June of 2016. No focal defect. His BP upon waking up was noted to be 190/90 and he had a headache along with left  upper outer chest pain that was nonexertional per H&P. He tells me today he never had chest pain, just weakness and a headache. He took his BP medications at home reportedly with his readings still being elevated at time of his arrival to [facility].   Upon his arrival to [facility] he was noted to have troponin negative x 5, BNP 68, unremarkable CBC, MG++ 1.9, K+ 4.3, SCr 1.25-->1.36, LDL 67. SBP of 179/89 with readings remaining in the 180 range into the evening that improved into the [id number] to [id number] systolic with titration of his antihypertensives and nitro paste. CXR no acute disease. CT head with no acute intracranial abnormality, with subtle, similar to prior, left cerebellar defect. EKG showed sinus rhythm with sinus arrhythymia, 66 bpm, nonspecific TWI III, aVF (old). He has been scheduled for a nuclear stress test by IM. MRI brain showed no acute intracranial findings.   He is feeling much better this morning and has no complaints. He does not want to have a Lexiscan and prefers to treadmill. No chest pain.     Past Medical History:  Diagnosis Date  . Chronic fatigue   . CKD (chronic kidney disease), stage III   . Claudication [facility])    a. R Hip.  [name] COPD (chronic obstructive pulmonary disease) ([place])   . Essential hypertension    a. 01/2008 Echo: EF 65%, no rwma, mod increased PASP.  [name] Osteoarthritis   . Paget's disease   . Syncope    a. 03/2015 in setting of hypotension.  . Tobacco abuse  a. 1-1.5 ppd x 60+ yrs.  . Type II diabetes mellitus ([place])       Most Recent Cardiac Studies: As above   Surgical History:  Past Surgical History:  Procedure Laterality Date  . APPENDECTOMY    . laparscopic chole    . SPINE SURGERY     cervical spine surgery  . trigeminal neuralgia     with surgery in [place]: Prior to Admission medications   Medication Sig Start Date End Date Taking? Authorizing Provider  amLODipine (NORVASC) 2.5 MG tablet Take 1 tablet (2.5 mg total) by mouth  daily. 04/19/16  Yes [name], DO  aspirin 81 MG EC tablet Take 1 tablet (81 mg total) by mouth daily. 04/18/14  Yes Nischal Dareen Piano, MD  glipiZIDE (GLUCOTROL XL) 2.5 MG 24 hr tablet Take 1 tablet (2.5 mg total) by mouth daily with breakfast. 04/19/16  Yes [name], DO  lisinopril (PRINIVIL,ZESTRIL) 10 MG tablet Take 1 tablet (10 mg total) by mouth daily. 04/19/16  Yes [name], DO  tamsulosin (FLOMAX) 0.4 MG CAPS capsule Take 1 capsule (0.4 mg total) by mouth daily. 04/19/16  Yes [name], DO  triamcinolone ointment (KENALOG) 0.1 % APPLY  OINTMENT TOPICALLY TWICE DAILY 05/06/16  Yes [name], DO  ONE TOUCH ULTRA TEST test strip USE TEST STRIP TO CHECK GLUCOSE AT LEAST ONCE DAILY 05/06/16   [name], DO    Inpatient Medications:  . amLODipine  5 mg Oral Daily  . aspirin EC  81 mg Oral Daily  . atorvastatin  40 mg Oral q1800  . enoxaparin (LOVENOX) injection  40 mg Subcutaneous Q24H  . glipiZIDE  2.5 mg Oral Q breakfast  . insulin aspart  0-15 Units Subcutaneous TID WC  . insulin aspart  0-5 Units Subcutaneous QHS  . insulin aspart  4 Units Subcutaneous TID WC  . lisinopril  20 mg Oral BID  . metoprolol tartrate  25 mg Oral BID  . nicotine  21 mg Transdermal Daily  . nitroGLYCERIN  1 inch Topical Q6H  . sodium chloride flush  3 mL Intravenous Q12H  . sodium chloride flush  3 mL Intravenous Q12H  . tamsulosin  0.4 mg Oral Daily  . triamcinolone ointment   Topical BID      Allergies: No Known Allergies  Social History   Social History  . Marital status: Married    Spouse name: N/A  . Number of children: N/A  . Years of education: N/A   Occupational History  . Not on file.   Social History Main Topics  . Smoking status: Current Every Day Smoker    Packs/day: 1.50    Years: 60.00    Types: Cigarettes  . Smokeless tobacco: Current User  . Alcohol use 0.0 oz/week     Comment: rare - "1% of the time."  . Drug use: No  . Sexual activity: Not on file   Other  Topics Concern  . Not on file   Social History Narrative   Lives in [place] with wife.  Retired Administrator.  Very sedentary.  Avoids activity b/c he "gives out" and is limited by right hip/buttock discomfort/claudication.     Family History  Problem Relation Age of Onset  . Cancer Mother     died @ 26.  [name] COPD Father     died @ 75.  . Diabetes Brother     died in early  60's.     Review of Systems: Review of Systems  Constitutional: Positive for malaise/fatigue. Negative for chills, diaphoresis, fever and weight loss.  HENT: Negative for congestion.   Eyes: Negative for discharge and redness.  Respiratory: Negative for cough, hemoptysis, sputum production, shortness of breath and wheezing.   Cardiovascular: Negative for chest pain, palpitations, orthopnea, claudication, leg swelling and PND.  Gastrointestinal: Negative for abdominal pain, blood in stool, heartburn, melena, nausea and vomiting.  Genitourinary: Negative for hematuria.  Musculoskeletal: Negative for falls and myalgias.  Skin: Negative for rash.  Neurological: Positive for dizziness, weakness and headaches. Negative for tingling, tremors, sensory change, speech change, focal weakness and loss of consciousness.  Endo/Heme/Allergies: Does not bruise/bleed easily.  Psychiatric/Behavioral: Negative for substance abuse. The patient is not nervous/anxious.   All other systems reviewed and are negative.   Labs:  Recent Labs  07/21/16 1436 07/21/16 1742 07/21/16 2338 07/22/16 0532  TROPONINI <0.03  <0.03 <0.03 <0.03 <0.03   Lab Results  Component Value Date   WBC 8.5 07/22/2016   HGB 13.2 07/22/2016   HCT 38.2 (L) 07/22/2016   MCV 90.4 07/22/2016   PLT 155 07/22/2016    Recent Labs Lab 07/21/16 1126  07/22/16 0532  NA 138  --  139  K 4.3  --  4.4  CL 104  --  107  CO2 27  --  26  BUN 13  --  19  CREATININE 1.35*  < > 1.36*  CALCIUM 9.3  --  8.6*  PROT 7.4  --   --   BILITOT 0.3  --   --     ALKPHOS 281*  --   --   ALT 21  --   --   AST 22  --   --   GLUCOSE 161*  --  129*  < > = values in this interval not displayed. Lab Results  Component Value Date   CHOL 126 07/22/2016   HDL 24 (L) 07/22/2016   LDLCALC 67 07/22/2016   TRIG 174 (H) 07/22/2016   Lab Results  Component Value Date   DDIMER 1.03 (H) 06/08/2011    Radiology/Studies:  Ct Head Wo Contrast  Result Date: 07/21/2016 CLINICAL DATA:  Headache.  Mental status changes.  Diabetes. EXAM: CT HEAD WITHOUT CONTRAST TECHNIQUE: Contiguous axial images were obtained from the base of the skull through the vertex without intravenous contrast. COMPARISON:  08/05/2014 FINDINGS: Brain: Moderate low density in the periventricular white matter likely related to small vessel disease. Left cerebellar hypoattenuation on image 8/series 2 is felt to be similar to on the prior exam. No hemorrhage, mass lesion, intra-axial, or extra-axial fluid collection Vascular: No hyperdense vessel or unexpected calcification. Skull: No significant soft tissue swelling. Left occipital craniotomy Sinuses/Orbits: Normal orbits and globes. Clear paranasal sinuses and mastoid air cells. Other: None IMPRESSION: 1.  No acute intracranial abnormality. 2. Mild to moderate small vessel ischemic change. 3. Subtle left cerebellar hypoattenuation is felt to be similar. Likely related to remote insult. Concurrent left occipital craniotomy. Electronically Signed   By: Abigail Miyamoto M.D.   On: 07/21/2016 11:09   Mr Brain Wo Contrast  Result Date: 07/21/2016 CLINICAL DATA:  Weakness and confusion, worsening today. Headache, hypertension, diabetes, and tobacco abuse. EXAM: MRI HEAD WITHOUT CONTRAST TECHNIQUE: Multiplanar, multiecho pulse sequences of the brain and surrounding structures were obtained without intravenous contrast. COMPARISON:  CT head 07/21/2016. FINDINGS: Brain: No evidence for acute infarction, hemorrhage, mass lesion, hydrocephalus, or extra-axial fluid.  Moderate cerebral and cerebellar atrophy. Mild subcortical and periventricular T2 and FLAIR hyperintensities, likely chronic microvascular ischemic change. Chronic changes of LEFT cerebellar encephalomalacia and hemorrhage, likely related to a suboccipital craniectomy defect. It is unclear if the cerebellar abnormality was the primary reason for the craniectomy, or was a complication of such. Vascular: Flow voids are maintained. Skull and upper cervical spine: Normal marrow signal. Sinuses/Orbits: No sinus or mastoid disease.  Negative orbits. Other: None. IMPRESSION: Chronic changes as described. Atrophy and small vessel disease. Old LEFT suboccipital craniectomy. No acute intracranial findings. Electronically Signed   By: Staci Righter M.D.   On: 07/21/2016 16:55   Dg Chest Portable 1 View  Result Date: 07/21/2016 CLINICAL DATA:  Headache, weakness, chest pain EXAM: PORTABLE CHEST 1 VIEW COMPARISON:  03/23/2015 FINDINGS: The heart size and mediastinal contours are within normal limits. Both lungs are clear. The visualized skeletal structures are unremarkable. IMPRESSION: No active disease. Electronically Signed   By: Kathreen Devoid   On: 07/21/2016 11:23    EKG: Interpreted by me showed: sinus rhythm with sinus arrhythymia, 66 bpm, nonspecific TWI III, aVF (old) Telemetry: Interpreted by me showed: sinus bradycardia, 40's bpm  Weights: Filed Weights   07/21/16 1042 07/21/16 1548  Weight: 195 lb (88.5 kg) 191 lb 3.2 oz (86.7 kg)     Physical Exam: Blood pressure 134/66, pulse (!) 50, temperature 97.3 F (36.3 C), temperature source Oral, resp. rate 19, height 6' (1.829 m), weight 191 lb 3.2 oz (86.7 kg), SpO2 94 %. Body mass index is 25.93 kg/m. General: Well developed, well nourished, in no acute distress. Head: Normocephalic, atraumatic, sclera non-icteric, no xanthomas, nares are without discharge.  Neck: Negative for carotid bruits. JVD not elevated. Lungs: Clear bilaterally to auscultation  without wheezes, rales, or rhonchi. Breathing is unlabored. Heart: Bradycardic, with S1 S2. No murmurs, rubs, or gallops appreciated. Abdomen: Soft, non-tender, non-distended with normoactive bowel sounds. No hepatomegaly. No rebound/guarding. No obvious abdominal masses. Msk:  Strength and tone appear normal for age. Extremities: No clubbing or cyanosis. No edema. Distal pedal pulses are 2+ and equal bilaterally. Neuro: Alert and oriented X 3. No facial asymmetry. No focal deficit. Moves all extremities spontaneously. Psych:  Responds to questions appropriately with a normal affect.    Assessment and Plan:  Principal Problem:   Malignant essential hypertension Active Problems:   Hypertensive encephalopathy   Chest pain   Tobacco abuse counseling   Hyperglycemia   Pyuria    1. Generalized weakness/maliase, fatigue: -Likely in the setting of accelerated hypertension -Much improved this morning, wants to treadmill -CT head and MRI brain non-acute -Cannot rule out hypertensive encephalopathy  -He is for treadmill Myoview this morning. I explained to him he may still need Lexiscan if we are unable to achieve target heart rate on treadmill  2. Accelerated HTN: -Much improved with nitro paste and current antihypertensive regimen -He has a headache from the nitro paste and requests this be discontinued, will stop -Upon stopping nitro paste it is likely he will need further titration of his antihypertensives, this can be reassessed s/p nuclear stress testing. Will increase his amlodipine in place of nitro paste  3. CKD stage III: -Stable -Monitor   4. Tobacco abuse: -Cessation advised    Signed, Christell Faith, PA-C [facility] Pager: [phone] 07/22/2016, 8:13 AM

## 2016-07-22 NOTE — Telephone Encounter (Signed)
Unable to reach patient.  No answer.  No voicemail.  Attempt to follow up with transitional care management.  Will continue to follow as appropriate.

## 2016-07-22 NOTE — Progress Notes (Signed)
Ryan notified on behalf of cardiology. Pt has been running sinus brady to SR low 60s this shift. Pt has orders for metoprolol 25 BID. Thurmond Butts will make changes to the dosage prior to patient discharge. I will continue to assess.

## 2016-07-22 NOTE — Progress Notes (Signed)
Pt returns from stress test. Test was incomplete secondary to patient not wanting a lexiscan. MD will make decision on parts of stress test that was completed and decide about  possible discharge. BP is some elevated 2/2 not receiving BP meds prior to test. I will administer BP meds and monitor BP.  Pt has no complaints of pain. I will continue to assess.

## 2016-07-22 NOTE — Discharge Instructions (Signed)
Keep log of bp at home

## 2016-07-23 ENCOUNTER — Telehealth: Payer: Self-pay

## 2016-07-23 NOTE — Telephone Encounter (Signed)
Transition Care Management Follow-up Telephone Call   Date discharged? 07/22/16   How have you been since you were released from the hospital? NO PAIN.  NO HEADACHES. MONITORING BP.  CONSISTENT WITH READINGS AT HOSPITAL. WILL BRING HOME LOG TO HFU.    Do you understand why you were in the hospital? YES, CHEST PAIN AND HEADACHES.   Do you understand the discharge instructions? YES, INCREASE ACTIVITIES AS TOLERATED.    Where were you discharged to? HOME.   Items Reviewed:  Medications reviewed: YES, TAKING ALL DISCHARGE AND PREVIOUSLY SCHEDULED MEDICATIONS AS DIRECTED.  Allergies reviewed: YES, NO KNOWN ALLERGIES  Dietary changes reviewed: YES, REGULAR DIET  Referrals reviewed: YES FOLLOW UP WITH PCP   Functional Questionnaire:   Activities of Daily Living (ADLs):   He states they are independent in the following: Independent in all ADLs. States they require assistance with the following: Does not require assistance at this time.   Any transportation issues/concerns?: NO   Any patient concerns? DIFFICULTY URINATING.  LOW PRESSURE.   Confirmed importance and date/time of follow-up visits scheduled YES, appointment scheduled 07/28/16 at 11:30.  Provider Appointment booked with Dr. Sung Amabile).  Confirmed with patient if condition begins to worsen call PCP or go to the ER.  Patient was given the office number and encouraged to call back with question or concerns.  : Yes, patient verbalized understanding.

## 2016-07-28 ENCOUNTER — Ambulatory Visit (INDEPENDENT_AMBULATORY_CARE_PROVIDER_SITE_OTHER): Payer: Medicare Other | Admitting: Family Medicine

## 2016-07-28 ENCOUNTER — Encounter: Payer: Self-pay | Admitting: Family Medicine

## 2016-07-28 VITALS — BP 134/82 | HR 67 | Temp 97.7°F | Wt 189.1 lb

## 2016-07-28 DIAGNOSIS — I10 Essential (primary) hypertension: Secondary | ICD-10-CM | POA: Diagnosis not present

## 2016-07-28 DIAGNOSIS — Z23 Encounter for immunization: Secondary | ICD-10-CM | POA: Diagnosis not present

## 2016-07-28 DIAGNOSIS — I674 Hypertensive encephalopathy: Secondary | ICD-10-CM

## 2016-07-28 DIAGNOSIS — Z09 Encounter for follow-up examination after completed treatment for conditions other than malignant neoplasm: Secondary | ICD-10-CM | POA: Diagnosis not present

## 2016-07-28 DIAGNOSIS — Z8679 Personal history of other diseases of the circulatory system: Secondary | ICD-10-CM | POA: Diagnosis not present

## 2016-07-28 MED ORDER — LISINOPRIL 20 MG PO TABS: 20.0000 mg | ORAL_TABLET | Freq: Every day | ORAL | 3 refills | Status: DC

## 2016-07-28 MED ORDER — AMLODIPINE BESYLATE 10 MG PO TABS: 10.0000 mg | ORAL_TABLET | Freq: Every day | ORAL | 3 refills | Status: DC

## 2016-07-28 NOTE — Progress Notes (Signed)
Pre visit review using our clinic review tool, if applicable. No additional management support is needed unless otherwise documented below in the visit note. 

## 2016-07-28 NOTE — Assessment & Plan Note (Signed)
New problem. Patient recently admitted for hypertensive encephalopathy and malignant hypertension. Hospital course was reviewed. His medications were reviewed in detail and were reconciled today. Changes were made. See orders. Patient is doing well. Blood pressure well controlled. He is to continue lisinopril 20 mg daily and amlodipine 10 mg daily. I sent in new prescriptions for these. The remainders of his medications are the same. No other issues or concerns at this time. Follow-up in 3 months.

## 2016-07-28 NOTE — Progress Notes (Addendum)
Subjective:  Patient ID: Zachary Santos, male    DOB: 01/25/1936  Age: 80 y.o. MRN: [id number]  CC: Hospital follow up  HPI:  80 year old male with hypertension, DM 2, COPD, CKD, tobacco abuse, BPH presents for hospital follow-up.  Patient was admitted from 10/4-10/5. Patient presented with elevated blood pressure and headache. Was admitted for hypertensive encephalopathy and malignant hypertension. Blood pressure improved during admission and lisinopril and amlodipine were increased. CTA was obtained and revealed a change in the cerebellum. MRI was negative. Remainder of his hospital course was unremarkable. He was discharged home in stable condition.  Patient presents today for follow-up. He states that he's doing well. His blood pressures are well controlled at home. No further chest pain, headache, or elevated blood pressure readings. He endorses compliance with his medications. We will review these and reconciled them today. He states that he is in need of refill on his blood pressure medications as they will be running out soon.  Social Hx   Social History   Social History  . Marital status: Married    Spouse name: N/A  . Number of children: N/A  . Years of education: N/A   Social History Main Topics  . Smoking status: Current Every Day Smoker    Packs/day: 1.50    Years: 60.00    Types: Cigarettes  . Smokeless tobacco: Current User  . Alcohol use 0.0 oz/week     Comment: rare - "1% of the time."  . Drug use: No  . Sexual activity: Not Asked   Other Topics Concern  . None   Social History Narrative   Lives in [place] with wife.  Retired Administrator.  Very sedentary.  Avoids activity b/c he "gives out" and is limited by right hip/buttock discomfort/claudication.    Review of Systems  Constitutional: Negative.   Respiratory: Negative.   Cardiovascular: Negative.   Neurological: Negative.    Objective:  BP 134/82 (BP Location: Right Arm, Patient Position:  Sitting, Cuff Size: Normal)   Pulse 67   Temp 97.7 F (36.5 C) (Oral)   Wt 189 lb 2 oz (85.8 kg)   SpO2 94%   BMI 25.65 kg/m   BP/Weight 07/28/2016 07/22/2016 [id number]  Systolic BP Q000111Q [id number] -  Diastolic BP 82 69 -  Wt. (Lbs) 189.13 - 191.2  BMI 25.65 - 25.93   Physical Exam  Constitutional: He is oriented to person, place, and time. He appears well-developed. No distress.  Cardiovascular: Normal rate and regular rhythm.   Pulmonary/Chest: Effort normal. He has no wheezes. He has no rales.  Neurological: He is alert and oriented to person, place, and time.  Psychiatric: He has a normal mood and affect.  Vitals reviewed.  Lab Results  Component Value Date   WBC 8.5 07/22/2016   HGB 13.2 07/22/2016   HCT 38.2 (L) 07/22/2016   PLT 155 07/22/2016   GLUCOSE 129 (H) 07/22/2016   CHOL 126 07/22/2016   TRIG 174 (H) 07/22/2016   HDL 24 (L) 07/22/2016   LDLCALC 67 07/22/2016   ALT 21 07/21/2016   AST 22 07/21/2016   NA 139 07/22/2016   K 4.4 07/22/2016   CL 107 07/22/2016   CREATININE 1.36 (H) 07/22/2016   BUN 19 07/22/2016   CO2 26 07/22/2016   TSH 1.347 07/21/2016   PSA 2.50 07/20/2012   INR 1.1 02/03/2008   HGBA1C 7.0 (H) 07/21/2016   MICROALBUR 5.58 (H) 02/02/2010   Assessment & Plan:   Problem List  Items Addressed This Visit    Hypertensive encephalopathy - Primary    New problem. Patient recently admitted for hypertensive encephalopathy and malignant hypertension. Hospital course was reviewed. His medications were reviewed in detail and were reconciled today. Changes were made. See orders. Patient is doing well. Blood pressure well controlled. He is to continue lisinopril 20 mg daily and amlodipine 10 mg daily. I sent in new prescriptions for these. The remainders of his medications are the same. No other issues or concerns at this time. Follow-up in 3 months.       Relevant Medications   amLODipine (NORVASC) 10 MG tablet   lisinopril (PRINIVIL,ZESTRIL) 20 MG  tablet    Other Visit Diagnoses    Encounter for immunization       Relevant Orders   Flu vaccine HIGH DOSE PF (Completed)     Meds ordered this encounter  Medications  . amLODipine (NORVASC) 10 MG tablet    Sig: Take 1 tablet (10 mg total) by mouth daily.    Dispense:  90 tablet    Refill:  3  . lisinopril (PRINIVIL,ZESTRIL) 20 MG tablet    Sig: Take 1 tablet (20 mg total) by mouth daily.    Dispense:  90 tablet    Refill:  3    Follow-up: Return in about 3 months (around 10/28/2016).  [place]

## 2016-07-28 NOTE — Patient Instructions (Signed)
Follow up in 3 months.  Continue your medications.  Take care  Dr. Lacinda Axon

## 2016-08-14 ENCOUNTER — Other Ambulatory Visit: Payer: Self-pay | Admitting: Family Medicine

## 2016-08-16 NOTE — Telephone Encounter (Signed)
Refilled 04/19/16. Pt last seen 07/28/16. Please advise?

## 2016-09-17 ENCOUNTER — Other Ambulatory Visit: Payer: Self-pay | Admitting: Family Medicine

## 2016-09-17 MED ORDER — TRIAMCINOLONE ACETONIDE 0.1 % EX OINT: TOPICAL_OINTMENT | CUTANEOUS | 0 refills | Status: DC

## 2016-09-17 NOTE — Telephone Encounter (Signed)
Refilled 05/06/16. Pt last seen 07/28/16. Please advise?

## 2016-09-17 NOTE — Telephone Encounter (Signed)
Pt called about needing a refill for triamcinolone ointment (KENALOG) 0.1 %.  Pharmacy is [id number] - [place] [address] [place]  Call pt @ [phone]. Thank you!

## 2016-10-20 ENCOUNTER — Ambulatory Visit: Payer: Medicare Other | Admitting: Family Medicine

## 2016-10-29 ENCOUNTER — Ambulatory Visit: Payer: Medicare Other | Admitting: Family Medicine

## 2016-11-01 ENCOUNTER — Ambulatory Visit (INDEPENDENT_AMBULATORY_CARE_PROVIDER_SITE_OTHER): Payer: Medicare Other | Admitting: Family Medicine

## 2016-11-01 ENCOUNTER — Encounter: Payer: Self-pay | Admitting: Family Medicine

## 2016-11-01 VITALS — BP 128/81 | HR 64 | Temp 97.6°F | Resp 14 | Wt 194.2 lb

## 2016-11-01 DIAGNOSIS — I10 Essential (primary) hypertension: Secondary | ICD-10-CM | POA: Diagnosis not present

## 2016-11-01 DIAGNOSIS — E1159 Type 2 diabetes mellitus with other circulatory complications: Secondary | ICD-10-CM | POA: Diagnosis not present

## 2016-11-01 DIAGNOSIS — H00015 Hordeolum externum left lower eyelid: Secondary | ICD-10-CM | POA: Diagnosis not present

## 2016-11-01 DIAGNOSIS — H00019 Hordeolum externum unspecified eye, unspecified eyelid: Secondary | ICD-10-CM | POA: Insufficient documentation

## 2016-11-01 DIAGNOSIS — E785 Hyperlipidemia, unspecified: Secondary | ICD-10-CM | POA: Diagnosis not present

## 2016-11-01 LAB — HEMOGLOBIN A1C: Hgb A1c MFr Bld: 7.8 % — ABNORMAL HIGH (ref 4.6–6.5)

## 2016-11-01 MED ORDER — POLYMYXIN B-TRIMETHOPRIM 10000-0.1 UNIT/ML-% OP SOLN: 1.0000 [drp] | Freq: Four times a day (QID) | OPHTHALMIC | 0 refills | Status: DC

## 2016-11-01 MED ORDER — TRIAMCINOLONE ACETONIDE 0.1 % EX OINT: TOPICAL_OINTMENT | CUTANEOUS | 3 refills | Status: DC

## 2016-11-01 NOTE — Assessment & Plan Note (Signed)
New problem. Treating with Polytrim.

## 2016-11-01 NOTE — Progress Notes (Signed)
Subjective:  Patient ID: Zachary Santos, male    DOB: [birth year]  Age: 81 y.o. MRN: [id number]  CC: Follow up, Lump on eyelid  HPI:  81 year old male with tobacco abuse, hypertension, dyslipidemia, COPD, CKD, BPH, DM 2 presents for follow up.   HTN  At goal on Norvasc and Lisinopril.  Dyslipidemia  LDL @ goal without meds.  DM-2  Has been well controlled.  Last A1c was 7.0.  Currently on Glipizide. Not checking sugars.  Lump on eyelid  Patient noticed a lump on his left lower eyelid 2 days ago.  Mild swelling.  Non-painful.  He reports some mild drainage from his left eye.  No acute vision changes.  No known exacerbating or relieving factors.  No interventions tried.  No other complaints or concerns at this time.  Social Hx   Social History   Social History  . Marital status: Married    Spouse name: N/A  . Number of children: N/A  . Years of education: N/A   Social History Main Topics  . Smoking status: Current Every Day Smoker    Packs/day: 1.50    Years: 60.00    Types: Cigarettes  . Smokeless tobacco: Current User  . Alcohol use 0.0 oz/week     Comment: rare - "1% of the time."  . Drug use: No  . Sexual activity: Not Asked   Other Topics Concern  . 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  Avoids activity b/c he "gives out" and is limited by right hip/buttock discomfort/claudication.    Review of Systems  Constitutional: Negative.   Eyes:       Lump, L eyelid   Objective:  BP 128/81   Pulse 64   Temp 97.6 F (36.4 C) (Oral)   Resp 14   Wt 194 lb 3.2 oz (88.1 kg)   SpO2 96%   BMI 26.34 kg/m   BP/Weight 11/01/2016 07/28/2016 [id number]  Systolic BP 0000000 [id number]  Diastolic BP 81 82 69  Wt. (Lbs) 194.2 189.13 -  BMI 26.34 25.65 -   Physical Exam  Constitutional: He is oriented to person, place, and time. He appears well-developed. No distress.  Eyes:  Left eye -  mild conjunctival injection. Swelling at the lateral left lower eyelid.  Cardiovascular: Normal rate and regular rhythm.   Pulmonary/Chest: Effort normal and breath sounds normal.  Neurological: He is alert and oriented to person, place, and time.  Psychiatric: He has a normal mood and affect.  Vitals reviewed.  Lab Results  Component Value Date   WBC 8.5 07/22/2016   HGB 13.2 07/22/2016   HCT 38.2 (L) 07/22/2016   PLT 155 07/22/2016   GLUCOSE 129 (H) 07/22/2016   CHOL 126 07/22/2016   TRIG 174 (H) 07/22/2016   HDL 24 (L) 07/22/2016   LDLCALC 67 07/22/2016   ALT 21 07/21/2016   AST 22 07/21/2016   NA 139 07/22/2016   K 4.4 07/22/2016   CL 107 07/22/2016   CREATININE 1.36 (H) 07/22/2016   BUN 19 07/22/2016   CO2 26 07/22/2016   TSH 1.347 07/21/2016   PSA 2.50 07/20/2012   INR 1.1 02/03/2008   HGBA1C 7.0 (H) 07/21/2016   MICROALBUR 5.58 (H) 02/02/2010    Assessment & Plan:   Problem List Items Addressed This Visit    Stye    New problem. Treating with Polytrim.      Essential  hypertension, benign - Primary    At goal.  Continue lisinopril and Norvasc.      Dyslipidemia    Stable. Will continue to monitor.      Diabetes type 2, controlled ([place])    Stable. Continue Glipizide. A1C today.      Relevant Orders   Hemoglobin A1c      Meds ordered this encounter  Medications  . trimethoprim-polymyxin b (POLYTRIM) ophthalmic solution    Sig: Place 1 drop into the left eye every 6 (six) hours.    Dispense:  10 mL    Refill:  0  . triamcinolone ointment (KENALOG) 0.1 %    Sig: APPLY  OINTMENT TOPICALLY TWICE DAILY    Dispense:  80 g    Refill:  3    Follow-up: 6 months.  [place]

## 2016-11-01 NOTE — Assessment & Plan Note (Signed)
At goal.  Continue lisinopril and Norvasc.

## 2016-11-01 NOTE — Assessment & Plan Note (Signed)
Stable. Continue Glipizide. A1C today.

## 2016-11-01 NOTE — Assessment & Plan Note (Signed)
Stable. Will continue to monitor.

## 2016-11-01 NOTE — Progress Notes (Signed)
Pre visit review using our clinic review tool, if applicable. No additional management support is needed unless otherwise documented below in the visit note. 

## 2016-11-01 NOTE — Patient Instructions (Signed)
We will call with your lab result.  Use the drop for the next 5-7 days. Warm compresses.  Follow up in 6 months  Take care  Dr. Lacinda Axon

## 2016-11-03 ENCOUNTER — Other Ambulatory Visit: Payer: Self-pay | Admitting: Family Medicine

## 2016-11-03 MED ORDER — LINAGLIPTIN 5 MG PO TABS: 5.0000 mg | ORAL_TABLET | Freq: Every day | ORAL | 1 refills | Status: DC

## 2016-11-23 ENCOUNTER — Telehealth: Payer: Self-pay | Admitting: *Deleted

## 2016-11-23 MED ORDER — LISINOPRIL 20 MG PO TABS: 20.0000 mg | ORAL_TABLET | Freq: Every day | ORAL | 3 refills | Status: DC

## 2016-11-23 NOTE — Telephone Encounter (Signed)
Pt requested a medication refill for lisinopril - 90 day supply Pharmacy [facility] [address]

## 2016-11-23 NOTE — Telephone Encounter (Signed)
rx sent

## 2016-12-02 ENCOUNTER — Other Ambulatory Visit: Payer: Self-pay | Admitting: *Deleted

## 2016-12-02 MED ORDER — TAMSULOSIN HCL 0.4 MG PO CAPS: 0.4000 mg | ORAL_CAPSULE | Freq: Every day | ORAL | 0 refills | Status: DC

## 2016-12-02 MED ORDER — AMLODIPINE BESYLATE 10 MG PO TABS: 10.0000 mg | ORAL_TABLET | Freq: Every day | ORAL | 3 refills | Status: DC

## 2016-12-02 NOTE — Telephone Encounter (Signed)
Refilled 08/16/16. Pt last seen 11/01/16. Please advise?

## 2016-12-02 NOTE — Telephone Encounter (Signed)
Pt requested a medication refill for tamsulosin and amlodipine  Pharmacy [facility] [address]

## 2017-03-02 ENCOUNTER — Other Ambulatory Visit: Payer: Self-pay | Admitting: Family Medicine

## 2017-03-02 NOTE — Telephone Encounter (Signed)
Refilled: 12/02/16 Last OV: 11/01/16 Last Labs: 07/22/16 Future OV: 05/02/17 Please advise?

## 2017-05-02 ENCOUNTER — Encounter: Payer: Self-pay | Admitting: Family Medicine

## 2017-05-02 ENCOUNTER — Ambulatory Visit (INDEPENDENT_AMBULATORY_CARE_PROVIDER_SITE_OTHER): Payer: Medicare Other | Admitting: Family Medicine

## 2017-05-02 VITALS — BP 126/64 | HR 48 | Temp 98.5°F | Resp 16 | Ht 72.0 in | Wt 183.0 lb

## 2017-05-02 DIAGNOSIS — Z125 Encounter for screening for malignant neoplasm of prostate: Secondary | ICD-10-CM

## 2017-05-02 DIAGNOSIS — R5383 Other fatigue: Secondary | ICD-10-CM | POA: Diagnosis not present

## 2017-05-02 DIAGNOSIS — E1159 Type 2 diabetes mellitus with other circulatory complications: Secondary | ICD-10-CM | POA: Diagnosis not present

## 2017-05-02 DIAGNOSIS — R634 Abnormal weight loss: Secondary | ICD-10-CM | POA: Insufficient documentation

## 2017-05-02 DIAGNOSIS — E785 Hyperlipidemia, unspecified: Secondary | ICD-10-CM | POA: Diagnosis not present

## 2017-05-02 LAB — CBC WITH DIFFERENTIAL/PLATELET
Basophils Absolute: 0 10*3/uL (ref 0.0–0.1)
Basophils Relative: 0.3 % (ref 0.0–3.0)
Eosinophils Absolute: 0.3 10*3/uL (ref 0.0–0.7)
Eosinophils Relative: 4.4 % (ref 0.0–5.0)
HCT: 42.7 % (ref 39.0–52.0)
Hemoglobin: 14.4 g/dL (ref 13.0–17.0)
Lymphocytes Relative: 23.8 % (ref 12.0–46.0)
Lymphs Abs: 1.6 10*3/uL (ref 0.7–4.0)
MCHC: 33.7 g/dL (ref 30.0–36.0)
MCV: 91.3 fl (ref 78.0–100.0)
Monocytes Absolute: 0.7 10*3/uL (ref 0.1–1.0)
Monocytes Relative: 9.5 % (ref 3.0–12.0)
Neutro Abs: 4.3 10*3/uL (ref 1.4–7.7)
Neutrophils Relative %: 62 % (ref 43.0–77.0)
Platelets: 185 10*3/uL (ref 150.0–400.0)
RBC: 4.67 Mil/uL (ref 4.22–5.81)
RDW: 14.3 % (ref 11.5–15.5)
WBC: 6.9 10*3/uL (ref 4.0–10.5)

## 2017-05-02 LAB — COMPREHENSIVE METABOLIC PANEL
ALT: 14 U/L (ref 0–53)
AST: 16 U/L (ref 0–37)
Albumin: 4.3 g/dL (ref 3.5–5.2)
Alkaline Phosphatase: 228 U/L — ABNORMAL HIGH (ref 39–117)
BUN: 17 mg/dL (ref 6–23)
CO2: 28 mEq/L (ref 19–32)
Calcium: 9.5 mg/dL (ref 8.4–10.5)
Chloride: 104 mEq/L (ref 96–112)
Creatinine, Ser: 1.43 mg/dL (ref 0.40–1.50)
GFR: 50.73 mL/min — ABNORMAL LOW (ref >60.00)
Glucose, Bld: 127 mg/dL — ABNORMAL HIGH (ref 70–99)
Potassium: 4.5 mEq/L (ref 3.5–5.1)
Sodium: 139 mEq/L (ref 135–145)
Total Bilirubin: 0.6 mg/dL (ref 0.2–1.2)
Total Protein: 6.6 g/dL (ref 6.0–8.3)

## 2017-05-02 LAB — LIPID PANEL
Cholesterol: 130 mg/dL (ref 0–200)
HDL: 27 mg/dL — ABNORMAL LOW (ref >39.00)
LDL Cholesterol: 75 mg/dL (ref 0–99)
NonHDL: 103.49
Total CHOL/HDL Ratio: 5
Triglycerides: 142 mg/dL (ref 0.0–149.0)
VLDL: 28.4 mg/dL (ref 0.0–40.0)

## 2017-05-02 LAB — PSA: PSA: 4.58 ng/mL — ABNORMAL HIGH (ref 0.10–4.00)

## 2017-05-02 LAB — VITAMIN B12: Vitamin B-12: 366 pg/mL (ref 211–911)

## 2017-05-02 LAB — HEMOGLOBIN A1C: Hgb A1c MFr Bld: 7.3 % — ABNORMAL HIGH (ref 4.6–6.5)

## 2017-05-02 LAB — TSH: TSH: 1.84 u[IU]/mL (ref 0.35–4.50)

## 2017-05-02 NOTE — Progress Notes (Signed)
Subjective:  Patient ID: Zachary Santos, male    DOB: 1936-09-22  Age: 81 y.o. MRN: [id number]  CC: Fatigue, early satiety, leg cramps  HPI:  81 year old male with hypertension, COPD, tobacco abuse, DM 2, CKD, dyslipidemia presents with the above complaints.  Patient reports ongoing fatigue. He states that for quite some time now he's not been feeling his normal self. He feels fatigued and has little energy. He reports that he's having early satiety. When he eats he feels full quickly. He states that food does not taste particularly well.  He states that he is not eating his normal amount of food. He has had some weight loss per our scales today. He continues to smoke. Reports baseline shortness of breath. No hematochezia or melena. No known inciting factor. No known exacerbating or relieving factors.  Additionally, patient reports that he's having nocturnal leg cramps. Improved with heat. Severe at times.  Social Hx   Social History   Social History  . Marital status: Married    Spouse name: N/A  . Number of children: N/A  . Years of education: N/A   Social History Main Topics  . Smoking status: Current Every Day Smoker    Packs/day: 1.50    Years: 60.00    Types: Cigarettes  . Smokeless tobacco: Current User  . Alcohol use 0.0 oz/week     Comment: rare - "1% of the time."  . Drug use: No  . Sexual activity: Not Asked   Other Topics Concern  . 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  Avoids activity b/c he "gives out" and is limited by right hip/buttock discomfort/claudication.    Review of Systems  Constitutional: Positive for fatigue.  Gastrointestinal: Negative for blood in stool.       Early satiety.   Objective:  BP 126/64   Pulse (!) 48   Temp 98.5 F (36.9 C) (Oral)   Resp 16   Ht 6' (1.829 m)   Wt 183 lb (83 kg)   SpO2 97%   BMI 24.82 kg/m   BP/Weight 05/02/2017 11/01/2016 09/32/3557    Systolic BP 322 025 427  Diastolic BP 64 81 82  Wt. (Lbs) 183 194.2 189.13  BMI 24.82 26.34 25.65    Physical Exam  Constitutional: He is oriented to person, place, and time. He appears well-developed. No distress.  HENT:  Head: Normocephalic and atraumatic.  Mouth/Throat: Oropharynx is clear and moist.  Neck: Neck supple. No thyromegaly present.  Cardiovascular: Normal rate and regular rhythm.   No murmur heard. Pulmonary/Chest: Effort normal. He has no wheezes. He has no rales.  Neurological: He is alert and oriented to person, place, and time.  Psychiatric: He has a normal mood and affect.  Vitals reviewed.  Lab Results  Component Value Date   WBC 8.5 07/22/2016   HGB 13.2 07/22/2016   HCT 38.2 (L) 07/22/2016   PLT 155 07/22/2016   GLUCOSE 129 (H) 07/22/2016   CHOL 126 07/22/2016   TRIG 174 (H) 07/22/2016   HDL 24 (L) 07/22/2016   LDLCALC 67 07/22/2016   ALT 21 07/21/2016   AST 22 07/21/2016   NA 139 07/22/2016   K 4.4 07/22/2016   CL 107 07/22/2016   CREATININE 1.36 (H) 07/22/2016   BUN 19 07/22/2016   CO2 26 07/22/2016   TSH 1.347 07/21/2016   PSA 2.50 07/20/2012   INR 1.1 02/03/2008  HGBA1C 7.8 (H) 11/01/2016   MICROALBUR 5.58 (H) 02/02/2010    Assessment & Plan:   Problem List Items Addressed This Visit      Endocrine   Diabetes type 2, controlled ([place])   Relevant Orders   Hemoglobin A1c   Comprehensive metabolic panel     Other   Dyslipidemia   Relevant Orders   Lipid panel   Fatigue - Primary    New problem. Uncertain etiology/prognosis of this time. I am quite concerned given reports of early satiety and weight loss. Laboratory studies today for further evaluation. Will likely additional imaging and/or referral to GI pending laboratory studies.      Relevant Orders   TSH   Vitamin B12   Iron, TIBC and Ferritin Panel   CBC w/Diff   Weight loss    New problem. Uncertain etiology/prognosis at this time. Experiencing weight loss and  early satiety. Starting workup with labs. Will await lab results prior to referral to GI. Likely needs upper endoscopy and possibly colonoscopy.       Other Visit Diagnoses    Prostate cancer screening       Relevant Orders   PSA     Follow-up: Pending labs  [place]

## 2017-05-02 NOTE — Assessment & Plan Note (Signed)
New problem. Uncertain etiology/prognosis of this time. I am quite concerned given reports of early satiety and weight loss. Laboratory studies today for further evaluation. Will likely additional imaging and/or referral to GI pending laboratory studies.

## 2017-05-02 NOTE — Patient Instructions (Signed)
We will call with your lab results and go from there.  Take care  Dr. Lacinda Axon

## 2017-05-02 NOTE — Assessment & Plan Note (Signed)
New problem. Uncertain etiology/prognosis at this time. Experiencing weight loss and early satiety. Starting workup with labs. Will await lab results prior to referral to GI. Likely needs upper endoscopy and possibly colonoscopy.

## 2017-05-03 LAB — IRON,TIBC AND FERRITIN PANEL
%SAT: 41 % (ref 15–60)
Ferritin: 250 ng/mL (ref 20–380)
Iron: 111 ug/dL (ref 50–180)
TIBC: 272 ug/dL (ref 250–425)

## 2017-05-05 ENCOUNTER — Other Ambulatory Visit: Payer: Self-pay | Admitting: Family Medicine

## 2017-05-05 DIAGNOSIS — R972 Elevated prostate specific antigen [PSA]: Secondary | ICD-10-CM

## 2017-05-05 DIAGNOSIS — R748 Abnormal levels of other serum enzymes: Secondary | ICD-10-CM

## 2017-05-12 ENCOUNTER — Other Ambulatory Visit (INDEPENDENT_AMBULATORY_CARE_PROVIDER_SITE_OTHER): Payer: Medicare Other

## 2017-05-12 DIAGNOSIS — R748 Abnormal levels of other serum enzymes: Secondary | ICD-10-CM | POA: Diagnosis not present

## 2017-05-12 LAB — COMPREHENSIVE METABOLIC PANEL
ALT: 15 U/L (ref 0–53)
AST: 15 U/L (ref 0–37)
Albumin: 4.3 g/dL (ref 3.5–5.2)
Alkaline Phosphatase: 228 U/L — ABNORMAL HIGH (ref 39–117)
BUN: 15 mg/dL (ref 6–23)
CO2: 26 mEq/L (ref 19–32)
Calcium: 9.4 mg/dL (ref 8.4–10.5)
Chloride: 106 mEq/L (ref 96–112)
Creatinine, Ser: 1.37 mg/dL (ref 0.40–1.50)
GFR: 53.3 mL/min — ABNORMAL LOW (ref >60.00)
Glucose, Bld: 144 mg/dL — ABNORMAL HIGH (ref 70–99)
Potassium: 4.1 mEq/L (ref 3.5–5.1)
Sodium: 139 mEq/L (ref 135–145)
Total Bilirubin: 0.7 mg/dL (ref 0.2–1.2)
Total Protein: 6.8 g/dL (ref 6.0–8.3)

## 2017-05-12 LAB — GAMMA GT: GGT: 17 U/L (ref 7–51)

## 2017-05-16 ENCOUNTER — Telehealth: Payer: Self-pay | Admitting: *Deleted

## 2017-05-16 NOTE — Telephone Encounter (Signed)
[facility] has requested a diagnostic code  Contact [phone]

## 2017-05-17 NOTE — Telephone Encounter (Signed)
Per  [place]  disregard call no information needed

## 2017-05-19 ENCOUNTER — Other Ambulatory Visit: Payer: Self-pay | Admitting: Family Medicine

## 2017-05-19 DIAGNOSIS — R748 Abnormal levels of other serum enzymes: Secondary | ICD-10-CM

## 2017-06-19 ENCOUNTER — Other Ambulatory Visit: Payer: Self-pay | Admitting: Family Medicine

## 2017-08-02 ENCOUNTER — Ambulatory Visit: Payer: Medicare Other | Admitting: Family Medicine

## 2017-08-02 ENCOUNTER — Ambulatory Visit (INDEPENDENT_AMBULATORY_CARE_PROVIDER_SITE_OTHER): Payer: Medicare Other | Admitting: Family Medicine

## 2017-08-02 ENCOUNTER — Encounter: Payer: Self-pay | Admitting: Family Medicine

## 2017-08-02 VITALS — BP 112/66 | HR 58 | Temp 98.4°F | Wt 180.8 lb

## 2017-08-02 DIAGNOSIS — R972 Elevated prostate specific antigen [PSA]: Secondary | ICD-10-CM | POA: Diagnosis not present

## 2017-08-02 DIAGNOSIS — N401 Enlarged prostate with lower urinary tract symptoms: Secondary | ICD-10-CM

## 2017-08-02 DIAGNOSIS — R0600 Dyspnea, unspecified: Secondary | ICD-10-CM

## 2017-08-02 DIAGNOSIS — E1159 Type 2 diabetes mellitus with other circulatory complications: Secondary | ICD-10-CM

## 2017-08-02 DIAGNOSIS — R748 Abnormal levels of other serum enzymes: Secondary | ICD-10-CM

## 2017-08-02 DIAGNOSIS — R0609 Other forms of dyspnea: Secondary | ICD-10-CM

## 2017-08-02 DIAGNOSIS — R634 Abnormal weight loss: Secondary | ICD-10-CM

## 2017-08-02 DIAGNOSIS — I10 Essential (primary) hypertension: Secondary | ICD-10-CM

## 2017-08-02 DIAGNOSIS — R351 Nocturia: Secondary | ICD-10-CM | POA: Diagnosis not present

## 2017-08-02 LAB — COMPREHENSIVE METABOLIC PANEL
ALT: 14 U/L (ref 0–53)
AST: 15 U/L (ref 0–37)
Albumin: 4.2 g/dL (ref 3.5–5.2)
Alkaline Phosphatase: 236 U/L — ABNORMAL HIGH (ref 39–117)
BUN: 15 mg/dL (ref 6–23)
CO2: 28 mEq/L (ref 19–32)
Calcium: 9.6 mg/dL (ref 8.4–10.5)
Chloride: 103 mEq/L (ref 96–112)
Creatinine, Ser: 1.37 mg/dL (ref 0.40–1.50)
GFR: 53.26 mL/min — ABNORMAL LOW (ref >60.00)
Glucose, Bld: 124 mg/dL — ABNORMAL HIGH (ref 70–99)
Potassium: 4.3 mEq/L (ref 3.5–5.1)
Sodium: 137 mEq/L (ref 135–145)
Total Bilirubin: 0.5 mg/dL (ref 0.2–1.2)
Total Protein: 6.8 g/dL (ref 6.0–8.3)

## 2017-08-02 LAB — CBC
HCT: 43.3 % (ref 39.0–52.0)
Hemoglobin: 14.3 g/dL (ref 13.0–17.0)
MCHC: 33.1 g/dL (ref 30.0–36.0)
MCV: 94 fl (ref 78.0–100.0)
Platelets: 194 10*3/uL (ref 150.0–400.0)
RBC: 4.6 Mil/uL (ref 4.22–5.81)
RDW: 13.9 % (ref 11.5–15.5)
WBC: 8.5 10*3/uL (ref 4.0–10.5)

## 2017-08-02 LAB — TSH: TSH: 2.23 u[IU]/mL (ref 0.35–4.50)

## 2017-08-02 LAB — HEMOGLOBIN A1C: Hgb A1c MFr Bld: 7 % — ABNORMAL HIGH (ref 4.6–6.5)

## 2017-08-02 NOTE — Assessment & Plan Note (Signed)
Seems to have some symptoms of BPH. He is unsure if he is taking the Flomax. He will confirm this and let us know. PSA was slightly elevated previously and we will refer to urology.

## 2017-08-02 NOTE — Assessment & Plan Note (Signed)
Patient with a fair bit of exertional dyspnea. Possibly worse over the last 6-12 months. EKG is without ischemic changes. Potentially could be cardiac or COPD related. He is unable to complete an exercise stress test last year. We will refer to cardiology to consider repeat evaluation. Refer to pulmonology as well. Check CBC. Given return precautions.

## 2017-08-02 NOTE — Assessment & Plan Note (Addendum)
Has continued to lose some weight. We'll get cologuard. Check lab work as outlined below. Refer to urology and endocrinology given elevated PSA and alkaline phosphatase for evaluation. Consider GI referral if other specialist evaluations are unrevealing though patient states he would decline a colonoscopy.

## 2017-08-02 NOTE — Progress Notes (Signed)
[name], MD Phone: [phone]  Zachary Santos is a 81 y.o. male who presents today for follow-up.  Hypertension: Not checking at home. Is taking amlodipine and lisinopril. No chest pain. Rare edema. No orthopnea. Does note some exertional dyspnea when he walks down the street and returns to his house. Takes a minute or so to catch his breath. He notes this is more than he would expect. It is worse than prior. Has been going on for 6-12 months. Has been relatively stable recently. He was unable to complete a stress test last year. He does have COPD. Not on any treatment currently.  Diabetes: Taking trajenta and glipizide. No polyuria or polydipsia. No hypoglycemia. Due for ophthalmology.  BPH: Nocturia 2. Slow flow at times. Feels like he empties his bladder fully. No daytime symptoms. No dysuria. Possibly taking Flomax.  Alkaline phosphatase previously elevated. Does not appear to have seen endocrinology.  PSA slightly elevated. Does not appear to have seen urology.  Patient does note he has lost some weight without trying. He is not eating quite as much which he states is related to not being as active as usual. No night sweats. Prior PSA slightly elevated. Alkaline phosphatase also slightly elevated. Not up-to-date on colon cancer screening.  PMH: Smoker   ROS see history of present illness  Objective  Physical Exam Vitals:   08/02/17 1349  BP: 112/66  Pulse: (!) 58  Temp: 98.4 F (36.9 C)  SpO2: 97%    BP Readings from Last 3 Encounters:  08/02/17 112/66  05/02/17 126/64  11/01/16 128/81   Wt Readings from Last 3 Encounters:  08/02/17 180 lb 12.8 oz (82 kg)  05/02/17 183 lb (83 kg)  11/01/16 194 lb 3.2 oz (88.1 kg)    Physical Exam  Constitutional: No distress.  Cardiovascular: Normal rate, regular rhythm and normal heart sounds.   Pulmonary/Chest: Effort normal and breath sounds normal.  Genitourinary:  Genitourinary Comments: Patient deferred rectal  exam.  Musculoskeletal: He exhibits no edema.  Neurological: He is alert. Gait normal.  Skin: Skin is warm and dry. He is not diaphoretic.   EKG: Sinus bradycardia, rate 51, no ischemic changes noted  Assessment/Plan: Please see individual problem list.  Weight loss Has continued to lose some weight. We'll get cologuard. Check lab work as outlined below. Refer to urology and endocrinology given elevated PSA and alkaline phosphatase for evaluation. Consider GI referral if other specialist evaluations are unrevealing though patient states he would decline a colonoscopy.  Essential hypertension, benign Well-controlled. Continue current medication.  Exertional dyspnea Patient with a fair bit of exertional dyspnea. Possibly worse over the last 6-12 months. EKG is without ischemic changes. Potentially could be cardiac or COPD related. He is unable to complete an exercise stress test last year. We will refer to cardiology to consider repeat evaluation. Refer to pulmonology as well. Check CBC. Given return precautions.  BPH (benign prostatic hyperplasia) Seems to have some symptoms of BPH. He is unsure if he is taking the Flomax. He will confirm this and let us know. PSA was slightly elevated previously and we will refer to urology.  Elevated PSA Refer to urology.  Elevated alkaline phosphatase level Refer to endocrinology. 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  Diabetes type 2, controlled A1c today. Continue current medication.   Orders Placed This Encounter  Procedures  . CBC  . Comp Met (CMET)  . HgB A1c  . TSH  . Ambulatory referral  to Cardiology    Referral Priority:   Routine    Referral Type:   Consultation    Referral Reason:   Specialty Services Required    Requested Specialty:   Cardiology    Number of Visits Requested:   1  . Ambulatory referral to Pulmonology    Referral Priority:   Routine    Referral Type:   Consultation    Referral  Reason:   Specialty Services Required    Requested Specialty:   Pulmonary Disease    Number of Visits Requested:   1  . Ambulatory referral to Endocrinology    Referral Priority:   Routine    Referral Type:   Consultation    Referral Reason:   Specialty Services Required    Number of Visits Requested:   1  . Ambulatory referral to Urology    Referral Priority:   Routine    Referral Type:   Consultation    Referral Reason:   Specialty Services Required    Requested Specialty:   Urology    Number of Visits Requested:   1  . EKG 12-Lead    [name], MD [place]

## 2017-08-02 NOTE — Patient Instructions (Addendum)
Nice to meet you. Please call us and let us know if he were taking the Flomax. We'll get lab work today and contact you with the results. We will get you to see pulmonology and cardiology. We'll get you to see endocrinology as well as urology.

## 2017-08-02 NOTE — Assessment & Plan Note (Signed)
Refer to urology.  ?

## 2017-08-02 NOTE — Assessment & Plan Note (Signed)
Refer to endocrinology. 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

## 2017-08-02 NOTE — Assessment & Plan Note (Signed)
A1c today. Continue current medication.

## 2017-08-02 NOTE — Assessment & Plan Note (Signed)
Well controlled. Continue current medication.  

## 2017-08-07 DIAGNOSIS — Z1212 Encounter for screening for malignant neoplasm of rectum: Secondary | ICD-10-CM | POA: Diagnosis not present

## 2017-08-07 DIAGNOSIS — Z1211 Encounter for screening for malignant neoplasm of colon: Secondary | ICD-10-CM | POA: Diagnosis not present

## 2017-08-09 LAB — COLOGUARD: Cologuard: NEGATIVE

## 2017-08-19 ENCOUNTER — Encounter: Payer: Self-pay | Admitting: Family Medicine

## 2017-08-22 ENCOUNTER — Ambulatory Visit: Payer: Self-pay | Admitting: Urology

## 2017-09-14 ENCOUNTER — Telehealth: Payer: Self-pay

## 2017-09-14 NOTE — Telephone Encounter (Signed)
offered sooner appt patient spouse wants to ask patient first 09/14/17 [facility]

## 2017-09-19 ENCOUNTER — Other Ambulatory Visit: Payer: Self-pay | Admitting: Family Medicine

## 2017-09-19 DIAGNOSIS — E1159 Type 2 diabetes mellitus with other circulatory complications: Secondary | ICD-10-CM

## 2017-10-23 NOTE — Progress Notes (Deleted)
Cardiology Office Note  Date:  10/23/2017   ID:  Zachary Santos, DOB April 29, 1936, MRN [id number]  PCP:  Leone Haven, MD   No chief complaint on file.   HPI:   HTN SOB DM II Smoker, COPD   some exertional dyspnea when he walks down the street and returns to his house. Takes a minute or so to catch his breath.   worse than prior. Has been going on for 6-12 months.  unable to complete a stress test last year. He does have COPD. Not on any treatment currently.  PMH:   has a past medical history of Chronic fatigue, CKD (chronic kidney disease), stage III ([place]), Claudication ([place]), COPD (chronic obstructive pulmonary disease) ([place]), Essential hypertension, Osteoarthritis, Paget's disease, Syncope, Tobacco abuse, and Type II diabetes mellitus ([place]).  PSH:    Past Surgical History:  Procedure Laterality Date  . APPENDECTOMY    . laparscopic chole    . SPINE SURGERY     cervical spine surgery  . trigeminal neuralgia     with surgery in [place]    Current Outpatient Medications  Medication Sig Dispense Refill  . amLODipine (NORVASC) 10 MG tablet Take 1 tablet (10 mg total) by mouth daily. 90 tablet 3  . aspirin 81 MG EC tablet Take 1 tablet (81 mg total) by mouth daily. 30 tablet 6  . glipiZIDE (GLUCOTROL XL) 2.5 MG 24 hr tablet TAKE ONE TABLET BY MOUTH ONCE DAILY WITH BREAKFAST 90 tablet 1  . lisinopril (PRINIVIL,ZESTRIL) 20 MG tablet Take 1 tablet (20 mg total) by mouth daily. 90 tablet 3  . ONE TOUCH ULTRA TEST test strip USE TEST STRIP TO CHECK GLUCOSE AT LEAST ONCE DAILY 50 each 11  . tamsulosin (FLOMAX) 0.4 MG CAPS capsule TAKE 1 CAPSULE BY MOUTH ONCE DAILY 90 capsule 0  . TRADJENTA 5 MG TABS tablet TAKE ONE TABLET BY MOUTH ONCE DAILY 90 tablet 1  . triamcinolone ointment (KENALOG) 0.1 % APPLY  OINTMENT TOPICALLY TWICE DAILY 80 g 3   No current facility-administered medications for this visit.      Allergies:   Patient has no known allergies.   Social History:   The patient  reports that he has been smoking cigarettes.  He has a 90.00 pack-year smoking history. He uses smokeless tobacco. He reports that he drinks alcohol. He reports that he does not use drugs.   Family History:   family history includes COPD in his father; Cancer in his mother; Diabetes in his brother.    Review of Systems: ROS   PHYSICAL EXAM: VS:  There were no vitals taken for this visit. , BMI There is no height or weight on file to calculate BMI. GEN: Well nourished, well developed, in no acute distress HEENT: normal Neck: no JVD, carotid bruits, or masses Cardiac: RRR; no murmurs, rubs, or gallops,no edema  Respiratory:  clear to auscultation bilaterally, normal work of breathing GI: soft, nontender, nondistended, + BS MS: no deformity or atrophy Skin: warm and dry, no rash Neuro:  Strength and sensation are intact Psych: euthymic mood, full affect    Recent Labs: 08/02/2017: ALT 14; BUN 15; Creatinine, Ser 1.37; Hemoglobin 14.3; Platelets 194.0; Potassium 4.3; Sodium 137; TSH 2.23    Lipid Panel Lab Results  Component Value Date   CHOL 130 05/02/2017   HDL 27.00 (L) 05/02/2017   LDLCALC 75 05/02/2017   TRIG 142.0 05/02/2017      Wt Readings from Last 3 Encounters:  08/02/17 180 lb 12.8 oz (82 kg)  05/02/17 183 lb (83 kg)  11/01/16 194 lb 3.2 oz (88.1 kg)       ASSESSMENT AND PLAN:  No diagnosis found.   Disposition:   F/U  6 months  No orders of the defined types were placed in this encounter.    Signed, Esmond Plants, M.D., Ph.D. 10/23/2017  [place], [place]

## 2017-10-25 ENCOUNTER — Ambulatory Visit: Payer: Medicare Other | Admitting: Cardiovascular Disease

## 2017-10-27 ENCOUNTER — Ambulatory Visit: Payer: Self-pay | Admitting: Urology

## 2017-11-08 ENCOUNTER — Encounter: Payer: Self-pay | Admitting: Family Medicine

## 2017-11-08 ENCOUNTER — Ambulatory Visit (INDEPENDENT_AMBULATORY_CARE_PROVIDER_SITE_OTHER): Payer: Medicare Other | Admitting: Family Medicine

## 2017-11-08 ENCOUNTER — Other Ambulatory Visit: Payer: Self-pay

## 2017-11-08 VITALS — BP 130/62 | HR 58 | Temp 97.5°F | Wt 191.6 lb

## 2017-11-08 DIAGNOSIS — R972 Elevated prostate specific antigen [PSA]: Secondary | ICD-10-CM

## 2017-11-08 DIAGNOSIS — I10 Essential (primary) hypertension: Secondary | ICD-10-CM

## 2017-11-08 DIAGNOSIS — E1159 Type 2 diabetes mellitus with other circulatory complications: Secondary | ICD-10-CM | POA: Diagnosis not present

## 2017-11-08 DIAGNOSIS — I739 Peripheral vascular disease, unspecified: Secondary | ICD-10-CM

## 2017-11-08 DIAGNOSIS — N401 Enlarged prostate with lower urinary tract symptoms: Secondary | ICD-10-CM | POA: Diagnosis not present

## 2017-11-08 DIAGNOSIS — R351 Nocturia: Secondary | ICD-10-CM

## 2017-11-08 LAB — POCT GLYCOSYLATED HEMOGLOBIN (HGB A1C): Hemoglobin A1C: 7.3

## 2017-11-08 MED ORDER — AMLODIPINE BESYLATE 10 MG PO TABS: 10.0000 mg | ORAL_TABLET | Freq: Every day | ORAL | 3 refills | Status: DC

## 2017-11-08 MED ORDER — GLIPIZIDE ER 2.5 MG PO TB24: ORAL_TABLET | ORAL | 3 refills | Status: DC

## 2017-11-08 MED ORDER — LINAGLIPTIN 5 MG PO TABS: 5.0000 mg | ORAL_TABLET | Freq: Every day | ORAL | 3 refills | Status: DC

## 2017-11-08 MED ORDER — TAMSULOSIN HCL 0.4 MG PO CAPS: 0.4000 mg | ORAL_CAPSULE | Freq: Every day | ORAL | 3 refills | Status: DC

## 2017-11-08 MED ORDER — LISINOPRIL 20 MG PO TABS: 20.0000 mg | ORAL_TABLET | Freq: Every day | ORAL | 3 refills | Status: DC

## 2017-11-08 NOTE — Progress Notes (Signed)
Zachary Rumps, MD Phone: [phone]  Zachary Santos is a 82 y.o. male who presents today for follow-up.  Diabetes: Typically between 115 and 125.  Taking glipizide and Tradjenta.  No polyuria or polydipsia.  Only gets hypoglycemic if he does not eat.  Does follow with ophthalmology.  Hypertension: Taking lisinopril and amlodipine.  No chest pain.  Notes chronic dyspnea on exertion that is unchanged from prior.  Does take an aspirin.  BPH: Taking Flomax with great benefit.  Up 1 time per night.  Stream is improved.  He does empty his bladder.  He reports he does not have any claudication.  Social History   Tobacco Use  Smoking Status Current Every Day Smoker  . Packs/day: 1.50  . Years: 60.00  . Pack years: 90.00  . Types: Cigarettes  Smokeless Tobacco Current User     ROS see history of present illness  Objective  Physical Exam Vitals:   11/08/17 1534  BP: 130/62  Pulse: (!) 58  Temp: (!) 97.5 F (36.4 C)  SpO2: 97%    BP Readings from Last 3 Encounters:  11/08/17 130/62  08/02/17 112/66  05/02/17 126/64   Wt Readings from Last 3 Encounters:  11/08/17 191 lb 9.6 oz (86.9 kg)  08/02/17 180 lb 12.8 oz (82 kg)  05/02/17 183 lb (83 kg)    Physical Exam  Constitutional: No distress.  Cardiovascular: Normal rate, regular rhythm and normal heart sounds.  Pulmonary/Chest: Effort normal and breath sounds normal.  Musculoskeletal: He exhibits no edema.  Neurological: He is alert. Gait normal.  Skin: Skin is warm and dry. He is not diaphoretic.  Decreased DP and PT pulses on exam bilaterally  Diabetic Foot Exam - Simple   Simple Foot Form Diabetic Foot exam was performed with the following findings:  Yes 11/08/2017  4:00 PM  Visual Inspection No deformities, no ulcerations, no other skin breakdown bilaterally:  Yes Sensation Testing Intact to touch and monofilament testing bilaterally:  Yes Pulse Check See comments:  Yes Comments Decreased PT and DP  pulses bilaterally     Assessment/Plan: Please see individual problem list.  Diabetes type 2, controlled Seems to be well controlled.  He will continue his current regimen.  Essential hypertension, benign Well-controlled.  Continue current regimen.  Chronic stable dyspnea on exertion.  Discussed evaluation for this though he declined.  If worsens will be evaluated.  BPH (benign prostatic hyperplasia) Well-controlled.  He never saw urology following an elevated PSA.  We will recheck and consider referral back to urology.  Right leg claudication Patient reports no symptoms.  Discussed ABIs with him though he declined this and opted to monitor.  Encouraged smoking cessation though he stated he is not ready.   Orders Placed This Encounter  Procedures  . PSA  . POCT HgB A1C    Meds ordered this encounter  Medications  . amLODipine (NORVASC) 10 MG tablet    Sig: Take 1 tablet (10 mg total) by mouth daily.    Dispense:  90 tablet    Refill:  3  . glipiZIDE (GLUCOTROL XL) 2.5 MG 24 hr tablet    Sig: TAKE ONE TABLET BY MOUTH ONCE DAILY WITH BREAKFAST    Dispense:  90 tablet    Refill:  3  . lisinopril (PRINIVIL,ZESTRIL) 20 MG tablet    Sig: Take 1 tablet (20 mg total) by mouth daily.    Dispense:  90 tablet    Refill:  3  . tamsulosin (FLOMAX) 0.4 MG  CAPS capsule    Sig: Take 1 capsule (0.4 mg total) by mouth daily.    Dispense:  90 capsule    Refill:  3  . linagliptin (TRADJENTA) 5 MG TABS tablet    Sig: Take 1 tablet (5 mg total) by mouth daily.    Dispense:  90 tablet    Refill:  [place], MD [place]

## 2017-11-08 NOTE — Patient Instructions (Signed)
Nice to see you. Please continue your current blood pressure and diabetes regimen.  We will contact you with your A1c results.

## 2017-11-09 LAB — PSA: PSA: 3.85 ng/mL (ref 0.10–4.00)

## 2017-11-10 ENCOUNTER — Other Ambulatory Visit: Payer: Self-pay | Admitting: Family Medicine

## 2017-11-10 DIAGNOSIS — R972 Elevated prostate specific antigen [PSA]: Secondary | ICD-10-CM

## 2017-11-10 NOTE — Assessment & Plan Note (Addendum)
Well-controlled.  Continue current regimen.  Chronic stable dyspnea on exertion.  Discussed evaluation for this though he declined.  If worsens will be evaluated.

## 2017-11-10 NOTE — Assessment & Plan Note (Signed)
Patient reports no symptoms.  Discussed ABIs with him though he declined this and opted to monitor.  Encouraged smoking cessation though he stated he is not ready.

## 2017-11-10 NOTE — Assessment & Plan Note (Signed)
Well-controlled.  He never saw urology following an elevated PSA.  We will recheck and consider referral back to urology.

## 2017-11-10 NOTE — Assessment & Plan Note (Signed)
Seems to be well controlled.  He will continue his current regimen.

## 2017-11-24 ENCOUNTER — Ambulatory Visit: Payer: Medicare Other | Admitting: Endocrinology

## 2017-11-29 ENCOUNTER — Ambulatory Visit: Payer: Medicare Other | Admitting: Urology

## 2018-01-27 ENCOUNTER — Other Ambulatory Visit: Payer: Self-pay | Admitting: Family Medicine

## 2018-08-15 ENCOUNTER — Other Ambulatory Visit: Payer: Self-pay

## 2018-08-15 NOTE — Patient Outreach (Signed)
[facility]) Care Management  08/15/2018  Zachary Santos 11/21/1935 [id number]   Medication Adherence call to Zachary Santos patient did not answer patient is due on Trajenta 5 mg, Lisinopril 20 mg and Glipizide 2.5 mg under [place].   [place] Management Direct Dial [phone]  Fax [phone] [email or web address]

## 2018-08-23 ENCOUNTER — Encounter: Payer: Medicare Other | Admitting: Family Medicine

## 2018-08-28 ENCOUNTER — Other Ambulatory Visit: Payer: Self-pay

## 2018-08-28 NOTE — Patient Outreach (Signed)
[facility]) Care Management  08/28/2018  Zachary Santos 1936/07/10 [id number]   Medication adherence call to Mr. [name] left a message for patient to call back patient is due on Trajenta 5 mg, Lisinopril 20 mg and Glipizide Er 2.5 mg. [name] is showing past due under [place].   [place] Management Direct Dial [phone]  Fax [phone] [email or web address]

## 2018-08-29 ENCOUNTER — Ambulatory Visit (INDEPENDENT_AMBULATORY_CARE_PROVIDER_SITE_OTHER): Payer: Medicare Other | Admitting: Family Medicine

## 2018-08-29 ENCOUNTER — Encounter: Payer: Self-pay | Admitting: Family Medicine

## 2018-08-29 VITALS — BP 120/72 | HR 53 | Ht 70.5 in | Wt 190.8 lb

## 2018-08-29 DIAGNOSIS — R972 Elevated prostate specific antigen [PSA]: Secondary | ICD-10-CM

## 2018-08-29 DIAGNOSIS — N529 Male erectile dysfunction, unspecified: Secondary | ICD-10-CM | POA: Insufficient documentation

## 2018-08-29 DIAGNOSIS — I10 Essential (primary) hypertension: Secondary | ICD-10-CM | POA: Diagnosis not present

## 2018-08-29 DIAGNOSIS — E785 Hyperlipidemia, unspecified: Secondary | ICD-10-CM

## 2018-08-29 DIAGNOSIS — E1159 Type 2 diabetes mellitus with other circulatory complications: Secondary | ICD-10-CM

## 2018-08-29 DIAGNOSIS — Z0001 Encounter for general adult medical examination with abnormal findings: Secondary | ICD-10-CM | POA: Diagnosis not present

## 2018-08-29 DIAGNOSIS — N401 Enlarged prostate with lower urinary tract symptoms: Secondary | ICD-10-CM

## 2018-08-29 DIAGNOSIS — R351 Nocturia: Secondary | ICD-10-CM

## 2018-08-29 DIAGNOSIS — Z23 Encounter for immunization: Secondary | ICD-10-CM | POA: Diagnosis not present

## 2018-08-29 LAB — POCT GLYCOSYLATED HEMOGLOBIN (HGB A1C): Hemoglobin A1C: 6.8 % — AB (ref 4.0–5.6)

## 2018-08-29 MED ORDER — NICOTINE 21 MG/24HR TD PT24: 21.0000 mg | MEDICATED_PATCH | Freq: Every day | TRANSDERMAL | 0 refills | Status: DC

## 2018-08-29 MED ORDER — TAMSULOSIN HCL 0.4 MG PO CAPS: 0.8000 mg | ORAL_CAPSULE | Freq: Every day | ORAL | 1 refills | Status: DC

## 2018-08-29 NOTE — Assessment & Plan Note (Signed)
Patient declined medication therapy.  Offered referral to urology to consider other treatments though he declined.

## 2018-08-29 NOTE — Progress Notes (Signed)
[name], MD Phone: [phone]  Zachary Santos is a 82 y.o. male who presents today for CPE.  Not exercising though does stay active.  He is remodeling his house. Eats lots of greens and meats.  He eats basic meals.  Drinks a lot of sweet tea during the summer.  Occasional soda.  Lots of milk and coffee.  Not much water. Up-to-date with Cologuard. Most recent PSA in the normal range.  Prior mildly elevated PSA was likely age normal.  Patient has aged out of PSA screening and I discussed this with him. Flu vaccination and tetanus vaccination up-to-date.  Pneumonia vaccination up-to-date.  Patient declined Shingrix. Smokes 1-1.5 packs/day.  He is ready to quit.  He does not want medication for this though would consider nicotine replacement therapy.  No alcohol use or illicit drug use. He sees ophthalmology in December.  He does not follow with the dentist. BPH: Patient does report slow urinary stream and nocturia.  This is bothersome to the patient.  He wonders about increasing his Flomax. Erectile dysfunction: Patient cannot maintain an erection for very long.  Notes no nighttime erections.  No pain with erections.  He is able to ejaculate.  He is tried phosphodiesterase 5 inhibitors previously and they were too expensive and did not work well. He reports cataracts which he is seeing optho.   Active Ambulatory Problems    Diagnosis Date Noted  . Essential hypertension, benign 02/05/2008  . COPD (chronic obstructive pulmonary disease) ([place]) 02/05/2008  . Paget's disease of bone 02/05/2008  . BPH (benign prostatic hyperplasia) 02/02/2010  . Osteoarthritis of right knee 06/07/2013  . Diabetes type 2, controlled ([place]) 09/12/2013  . CKD (chronic kidney disease), stage III ([place])   . Generalized anxiety disorder 04/25/2015  . Exertional dyspnea 06/20/2015  . Right leg claudication ([place]) 06/20/2015  . Dyslipidemia 11/01/2016  . Fatigue 05/02/2017  . Weight loss 05/02/2017  . Elevated  PSA 08/02/2017  . Elevated alkaline phosphatase level 08/02/2017  . Encounter for general adult medical examination with abnormal findings 08/29/2018  . Erectile dysfunction 08/29/2018   Resolved Ambulatory Problems    Diagnosis Date Noted  . TOBACCO ABUSE 02/05/2008  . Acute bronchitis 02/05/2008  . FATIGUE 03/13/2008  . Neoplasm of uncertain behavior of skin 06/28/2011  . Healthcare maintenance 06/28/2011  . Syncope   . Essential hypertension   . Hypotension 03/24/2015  . Atypical nevi 10/27/2015  . Malignant essential hypertension 07/21/2016  . Hypertensive encephalopathy 07/21/2016  . Chest pain 07/21/2016  . Hyperglycemia 07/21/2016  . Pyuria 07/21/2016  . Stye 11/01/2016   Past Medical History:  Diagnosis Date  . Chronic fatigue   . Claudication ([place])   . Osteoarthritis   . Paget's disease   . Tobacco abuse   . Type II diabetes mellitus ([facility])     Family History  Problem Relation Age of Onset  . Cancer Mother        died @ 44.  [name] COPD Father        died @ 48.  . Diabetes Brother        died in early 5's.    Social History   Socioeconomic History  . Marital status: Married    Spouse name: Not on file  . Number of children: Not on file  . Years of education: Not on file  . Highest education level: Not on file  Occupational History  . Not on file  Social Needs  . Financial resource strain: Not  on file  . Food insecurity:    Worry: Not on file    Inability: Not on file  . Transportation needs:    Medical: Not on file    Non-medical: Not on file  Tobacco Use  . Smoking status: Current Every Day Smoker    Packs/day: 1.50    Years: 60.00    Pack years: 90.00    Types: Cigarettes  . Smokeless tobacco: Current User  Substance and Sexual Activity  . Alcohol use: Yes    Alcohol/week: 0.0 standard drinks    Comment: rare - "1% of the time."  . Drug use: No  . Sexual activity: Not on file  Lifestyle  . Physical activity:    Days per week: Not on  file    Minutes per session: Not on file  . Stress: Not on file  Relationships  . Social connections:    Talks on phone: Not on file    Gets together: Not on file    Attends religious service: Not on file    Active member of club or organization: Not on file    Attends meetings of clubs or organizations: Not on file    Relationship status: Not on file  . Intimate partner violence:    Fear of current or ex partner: Not on file    Emotionally abused: Not on file    Physically abused: Not on file    Forced sexual activity: Not on file  Other Topics Concern  . Not on file  Social History Narrative   Lives in [place] with wife.  Retired Administrator.  Very sedentary.  Avoids activity b/c he "gives out" and is limited by right hip/buttock discomfort/claudication.    ROS  General:  Negative for nexplained weight loss, fever Skin: Negative for new or changing mole, sore that won't heal HEENT: Positive for trouble seeing, negative for trouble hearing, ringing in ears, mouth sores, hoarseness, change in voice, dysphagia. CV:  Negative for chest pain, dyspnea, edema, palpitations Resp: Negative for cough, dyspnea, hemoptysis GI: Negative for nausea, vomiting, diarrhea, constipation, abdominal pain, melena, hematochezia. GU: Positive for frequent urination, sexual difficulty, negative for dysuria, incontinence, urinary hesitance, hematuria, vaginal or penile discharge, lumps in testicle or breasts MSK: Negative for muscle cramps or aches, joint pain or swelling Neuro: Negative for headaches, weakness, numbness, dizziness, passing out/fainting Psych: Negative for depression, anxiety, memory problems  Objective  Physical Exam Vitals:   08/29/18 1507  BP: 120/72  Pulse: (!) 53  SpO2: 97%    BP Readings from Last 3 Encounters:  08/29/18 120/72  11/08/17 130/62  08/02/17 112/66   Wt Readings from Last 3 Encounters:  08/29/18 190 lb 12.8 oz (86.5 kg)  11/08/17 191 lb 9.6 oz (86.9  kg)  08/02/17 180 lb 12.8 oz (82 kg)    Physical Exam  Constitutional: No distress.  HENT:  Head: Normocephalic and atraumatic.  Mouth/Throat: Oropharynx is clear and moist.  Eyes: Pupils are equal, round, and reactive to light. Conjunctivae are normal.  Neck: Neck supple.  Cardiovascular: Normal rate, regular rhythm and normal heart sounds.  Pulmonary/Chest: Effort normal and breath sounds normal.  Abdominal: Soft. Bowel sounds are normal. He exhibits no distension. There is no tenderness. There is no rebound and no guarding.  Genitourinary:  Genitourinary Comments: Patient declined rectal and genital exam  Musculoskeletal: He exhibits no edema.  Lymphadenopathy:    He has no cervical adenopathy.  Neurological: He is alert.  Skin: Skin is warm  and dry. He is not diaphoretic.  Psychiatric: He has a normal mood and affect.     Assessment/Plan:   Encounter for general adult medical examination with abnormal findings Physical exam completed.  Encouraged him to stay active.  Discussed decreasing sweet tea intake.  Discussed adding water.  Patient declined Shingrix.  Other vaccinations up-to-date.  Discussed tobacco cessation.  He will pick a quit date and start the nicotine patches.  Lab work as outlined below.  He will keep his appointment with his ophthalmologist.  BPH (benign prostatic hyperplasia) Patient declined exam.  We will increase his Flomax.  He will monitor for lightheadedness with this and contact us if this occurs.  If symptoms are not improving discussed he would need a referral to urology.  Erectile dysfunction Patient declined medication therapy.  Offered referral to urology to consider other treatments though he declined.  Elevated PSA Returned to normal.  Patient declined urology evaluation.  I suspect his prior mild elevation was normal for his age.  Discussed discontinuing prostate cancer screening given his age.  No family history.   Orders Placed This  Encounter  Procedures  . Flu vaccine HIGH DOSE PF (Fluzone High dose)  . Lipid panel  . Comp Met (CMET)  . POCT glycosylated hemoglobin (Hb A1C)    Meds ordered this encounter  Medications  . nicotine (NICODERM CQ - DOSED IN MG/24 HOURS) 21 mg/24hr patch    Sig: Place 1 patch (21 mg total) onto the skin daily. Daily for 6 weeks, then call for refill    Dispense:  42 patch    Refill:  0  . tamsulosin (FLOMAX) 0.4 MG CAPS capsule    Sig: Take 2 capsules (0.8 mg total) by mouth daily.    Dispense:  180 capsule    Refill:  1     [name], MD [place]

## 2018-08-29 NOTE — Patient Instructions (Signed)
Nice to see you. We will get labs today.  Please try to cut down on sugary drinks.  Please try to increase water intake. Please use the nicotine patches for smoking cessation. We will increase your Flomax.  If you notice any lightheadedness with this please decrease the dose and let us know.

## 2018-08-29 NOTE — Assessment & Plan Note (Signed)
Returned to normal.  Patient declined urology evaluation.  I suspect his prior mild elevation was normal for his age.  Discussed discontinuing prostate cancer screening given his age.  No family history.

## 2018-08-29 NOTE — Assessment & Plan Note (Signed)
Patient declined exam.  We will increase his Flomax.  He will monitor for lightheadedness with this and contact us if this occurs.  If symptoms are not improving discussed he would need a referral to urology.

## 2018-08-29 NOTE — Assessment & Plan Note (Addendum)
Physical exam completed.  Encouraged him to stay active.  Discussed decreasing sweet tea intake.  Discussed adding water.  Patient declined Shingrix.  Other vaccinations up-to-date.  Discussed tobacco cessation.  He will pick a quit date and start the nicotine patches.  Lab work as outlined below.  He will keep his appointment with his ophthalmologist.

## 2018-08-30 LAB — COMPREHENSIVE METABOLIC PANEL
ALT: 15 U/L (ref 0–53)
AST: 15 U/L (ref 0–37)
Albumin: 4.4 g/dL (ref 3.5–5.2)
Alkaline Phosphatase: 232 U/L — ABNORMAL HIGH (ref 39–117)
BUN: 12 mg/dL (ref 6–23)
CO2: 28 mEq/L (ref 19–32)
Calcium: 9.3 mg/dL (ref 8.4–10.5)
Chloride: 106 mEq/L (ref 96–112)
Creatinine, Ser: 1.43 mg/dL (ref 0.40–1.50)
GFR: 50.55 mL/min — ABNORMAL LOW (ref >60.00)
Glucose, Bld: 102 mg/dL — ABNORMAL HIGH (ref 70–99)
Potassium: 4 mEq/L (ref 3.5–5.1)
Sodium: 140 mEq/L (ref 135–145)
Total Bilirubin: 0.5 mg/dL (ref 0.2–1.2)
Total Protein: 6.8 g/dL (ref 6.0–8.3)

## 2018-08-30 LAB — LIPID PANEL
Cholesterol: 143 mg/dL (ref 0–200)
HDL: 29.3 mg/dL — ABNORMAL LOW (ref >39.00)
LDL Cholesterol: 86 mg/dL (ref 0–99)
NonHDL: 114.13
Total CHOL/HDL Ratio: 5
Triglycerides: 139 mg/dL (ref 0.0–149.0)
VLDL: 27.8 mg/dL (ref 0.0–40.0)

## 2018-08-31 ENCOUNTER — Other Ambulatory Visit: Payer: Self-pay | Admitting: Family Medicine

## 2018-08-31 DIAGNOSIS — R748 Abnormal levels of other serum enzymes: Secondary | ICD-10-CM

## 2018-08-31 DIAGNOSIS — M889 Osteitis deformans of unspecified bone: Secondary | ICD-10-CM

## 2018-08-31 DIAGNOSIS — E1159 Type 2 diabetes mellitus with other circulatory complications: Secondary | ICD-10-CM

## 2018-08-31 MED ORDER — LISINOPRIL 20 MG PO TABS: 20.0000 mg | ORAL_TABLET | Freq: Every day | ORAL | 3 refills | Status: DC

## 2018-08-31 MED ORDER — LINAGLIPTIN 5 MG PO TABS: 5.0000 mg | ORAL_TABLET | Freq: Every day | ORAL | 3 refills | Status: DC

## 2018-08-31 MED ORDER — GLIPIZIDE ER 2.5 MG PO TB24: ORAL_TABLET | ORAL | 3 refills | Status: DC

## 2018-08-31 MED ORDER — AMLODIPINE BESYLATE 10 MG PO TABS: 10.0000 mg | ORAL_TABLET | Freq: Every day | ORAL | 3 refills | Status: DC

## 2018-08-31 MED ORDER — TRIAMCINOLONE ACETONIDE 0.1 % EX OINT: TOPICAL_OINTMENT | CUTANEOUS | 3 refills | Status: DC

## 2018-08-31 NOTE — Telephone Encounter (Signed)
Copied from [place] [id number]. Topic: Quick Communication - See Telephone Encounter >> Aug 31, 2018  2:46 PM Vernona Rieger wrote: CRM for notification. See Telephone encounter for: 08/31/18.  Patient states that Dr Caryl Bis was suppose to call these in yesterday at his appointment, please advise.   amLODipine (NORVASC) 10 MG tablet glipiZIDE (GLUCOTROL XL) 2.5 MG 24 hr tablet lisinopril (PRINIVIL,ZESTRIL) 20 MG tablet linagliptin (TRADJENTA) 5 MG TABS tablet triamcinolone ointment (KENALOG) 0.1 %  [place] [address]), [place] [address] [place] ([place])  [id number] Phone: [phone] Fax: [phone]

## 2018-09-19 DIAGNOSIS — H40023 Open angle with borderline findings, high risk, bilateral: Secondary | ICD-10-CM | POA: Diagnosis not present

## 2018-09-19 DIAGNOSIS — H2513 Age-related nuclear cataract, bilateral: Secondary | ICD-10-CM | POA: Diagnosis not present

## 2018-09-19 DIAGNOSIS — H524 Presbyopia: Secondary | ICD-10-CM | POA: Diagnosis not present

## 2018-09-19 DIAGNOSIS — E119 Type 2 diabetes mellitus without complications: Secondary | ICD-10-CM | POA: Diagnosis not present

## 2018-09-19 LAB — HM DIABETES EYE EXAM

## 2018-12-13 ENCOUNTER — Emergency Department
Admission: EM | Admit: 2018-12-13 | Discharge: 2018-12-13 | Discharge status: Against Medical Advice | Payer: Medicare Other | Attending: Emergency Medicine | Admitting: Emergency Medicine

## 2018-12-13 ENCOUNTER — Encounter: Payer: Self-pay | Admitting: Emergency Medicine

## 2018-12-13 ENCOUNTER — Other Ambulatory Visit: Payer: Self-pay

## 2018-12-13 DIAGNOSIS — Z79899 Other long term (current) drug therapy: Secondary | ICD-10-CM | POA: Diagnosis not present

## 2018-12-13 DIAGNOSIS — M549 Dorsalgia, unspecified: Secondary | ICD-10-CM | POA: Insufficient documentation

## 2018-12-13 MED ORDER — OXYCODONE-ACETAMINOPHEN 5-325 MG PO TABS
1.0000 | ORAL_TABLET | ORAL | Status: DC | PRN
  Administered 2018-12-13: 1 via ORAL

## 2018-12-13 MED ORDER — OXYCODONE-ACETAMINOPHEN 5-325 MG PO TABS
ORAL_TABLET | ORAL | Status: AC
  Filled 2018-12-13: qty 1

## 2018-12-13 NOTE — ED Notes (Signed)
No answer when called several times from lobby 

## 2018-12-13 NOTE — ED Triage Notes (Signed)
Patient to ER for c/o left lower back pain that began tonight. States he was working on his car today, no heavy lifting, then developed lower back pain with pain into left hip.

## 2018-12-19 ENCOUNTER — Other Ambulatory Visit: Payer: Self-pay | Admitting: *Deleted

## 2018-12-21 ENCOUNTER — Other Ambulatory Visit: Payer: Self-pay | Admitting: *Deleted

## 2018-12-21 NOTE — Patient Outreach (Signed)
Referral received UM citing pt needs assistance finding new primary care MD in his area, referral also requests community or telephonic RN management as well as LCSW, outreach call to pt for screening, no answer to telephone [phone] and no option to leave voicemail.  Rn CM mailed successful outreach letter to pt home.  PLAN Outreach pt in 3-4 business days  Jacqlyn Larsen [facility], [place] [phone]

## 2018-12-25 DIAGNOSIS — E119 Type 2 diabetes mellitus without complications: Secondary | ICD-10-CM | POA: Diagnosis not present

## 2018-12-25 DIAGNOSIS — H401230 Low-tension glaucoma, bilateral, stage unspecified: Secondary | ICD-10-CM | POA: Diagnosis not present

## 2018-12-25 DIAGNOSIS — H2513 Age-related nuclear cataract, bilateral: Secondary | ICD-10-CM | POA: Diagnosis not present

## 2018-12-25 DIAGNOSIS — H40023 Open angle with borderline findings, high risk, bilateral: Secondary | ICD-10-CM | POA: Diagnosis not present

## 2018-12-26 ENCOUNTER — Other Ambulatory Visit: Payer: Self-pay | Admitting: *Deleted

## 2018-12-26 NOTE — Patient Outreach (Signed)
Outreach call to pt for screening/  2nd attempt, pt with diagnosis, CKD 3, DM 2, COPD, back pain with recent visit to ED in February related to back pain, spoke with pt, HIPAA verified, explained [facility] program, screening completed, pt reports he lives with wife and "we help each other"  Pt drives, wife prefills med box, pt states he can afford medications and receives extra help, pt reports he is going to continue seeing Dr. Caryl Bis, is not attempting to find another doctor, pt reports he has diabetes but refuses to check CBG, states he is not interested in this, AIC is 6.8 and pt states "not really worried about it"  Pt states he smokes 1.5-2 ppd and not interested in smoking cessation, states he has nicotine patches but not going to use.  Pt reports " my back is much better and I'm using ibuprofen"  RN CM explained each discipline, RN, LCSW and pharmacist and pt not interested, pt aware of need to check CBG and smoking cessation but not willing, RN CM also talked with pt about carbohydrate modified diet and pt feels at his age he will eat what he wants.  RN CM mailed unsuccessful outreach letter to pt home.  PLAN Close case today  Jacqlyn Larsen [facility], [place] Coordinator [phone]

## 2019-02-14 ENCOUNTER — Ambulatory Visit: Payer: Self-pay | Admitting: *Deleted

## 2019-02-14 NOTE — Telephone Encounter (Signed)
Triage nurse called Office and advised patient BP 111/55 and that patient is symptomatic advised patient needs to be evaluated at ED or UC, concerned for dehydration due to patient says he has been working outside, patient symptoms dizziness, fatigue and feels disoriented.

## 2019-02-14 NOTE — Telephone Encounter (Signed)
Decreased BP- complains of fatigue, dizziness, some disorientation.Patient has been outside all day mowing and washing truck. Call to office and they agree that patient needs to be checked at ED- advised patient he needs to go to ED- he stated he will go.  Reason for Disposition . [3] Fall in systolic BP > 20 mm Hg from normal AND [2] dizzy, lightheaded, or weak  Answer Assessment - Initial Assessment Questions 1. BLOOD PRESSURE: "What is the blood pressure?" "Did you take at least two measurements 5 minutes apart?"     111/55, 112/53 2. ONSET: "When did you take your blood pressure?"     2:45 3. HOW: "How did you obtain the blood pressure?" (e.g., visiting nurse, automatic home BP monitor)     Automatic BP cuff 4. HISTORY: "Do you have a history of low blood pressure?" "What is your blood pressure normally?"     History of low BP- 123/30 over 60-3-67 5. MEDICATIONS: "Are you taking any medications for blood pressure?" If yes: "Have they been changed recently?"     Using BP medication, no changes- have double prostate medication  6. PULSE RATE: "Do you know what your pulse rate is?"      74 7. OTHER SYMPTOMS: "Have you been sick recently?" "Have you had a recent injury?"     no 8. PREGNANCY: "Is there any chance you are pregnant?" "When was your last menstrual period?"     n/a  Protocols used: LOW BLOOD PRESSURE-A-AH

## 2019-03-13 ENCOUNTER — Ambulatory Visit: Payer: Medicare Other | Admitting: Family Medicine

## 2019-03-30 ENCOUNTER — Other Ambulatory Visit: Payer: Self-pay | Admitting: Family Medicine

## 2019-04-02 ENCOUNTER — Other Ambulatory Visit: Payer: Self-pay

## 2019-04-02 NOTE — Patient Outreach (Signed)
[facility]) Care Management  04/02/2019  SHRIHAAN PORZIO Nov 29, 1935 [id number]   Medication Adherence call to Mr. Bradly Sangiovanni HIPPA Compliant Voice message left with a call back number. Left a message with family Member. Mr. Ricarda Frame is showing past due on Lisinopril 20 mg and Tradjenta 5 mg. Under [facility] Ins.   [place] Management Direct Dial [phone]  Fax [phone] [email or web address]

## 2019-04-13 DIAGNOSIS — H2513 Age-related nuclear cataract, bilateral: Secondary | ICD-10-CM | POA: Diagnosis not present

## 2019-04-13 DIAGNOSIS — E119 Type 2 diabetes mellitus without complications: Secondary | ICD-10-CM | POA: Diagnosis not present

## 2019-04-13 DIAGNOSIS — H5203 Hypermetropia, bilateral: Secondary | ICD-10-CM | POA: Diagnosis not present

## 2019-04-13 DIAGNOSIS — H52223 Regular astigmatism, bilateral: Secondary | ICD-10-CM | POA: Diagnosis not present

## 2019-04-13 DIAGNOSIS — H401231 Low-tension glaucoma, bilateral, mild stage: Secondary | ICD-10-CM | POA: Diagnosis not present

## 2019-06-18 DIAGNOSIS — H2513 Age-related nuclear cataract, bilateral: Secondary | ICD-10-CM | POA: Diagnosis not present

## 2019-06-18 DIAGNOSIS — H401231 Low-tension glaucoma, bilateral, mild stage: Secondary | ICD-10-CM | POA: Diagnosis not present

## 2019-06-18 DIAGNOSIS — H52223 Regular astigmatism, bilateral: Secondary | ICD-10-CM | POA: Diagnosis not present

## 2019-06-18 DIAGNOSIS — E119 Type 2 diabetes mellitus without complications: Secondary | ICD-10-CM | POA: Diagnosis not present

## 2019-07-04 ENCOUNTER — Other Ambulatory Visit: Payer: Self-pay

## 2019-07-04 NOTE — Patient Outreach (Signed)
[facility]) Care Management  07/04/2019  SHOUA BRINTNALL 29-Sep-1936 [id number]  Medication Adherence call to Mr. Yaqub Reine Hippa Identifiers Verify spoke with patient he is past due on Trajenta 5 mg and Lisinopril 20 mg patient explain he takes 1 tablet daily and has enough until his next doctor appointment and will ask doctor for more refills. Mr. Arbaiza is showing past due under [place].  [place] Management Direct Dial [phone]  Fax [phone] [email or web address]

## 2019-07-12 ENCOUNTER — Other Ambulatory Visit: Payer: Self-pay

## 2019-07-16 ENCOUNTER — Other Ambulatory Visit: Payer: Self-pay

## 2019-07-16 ENCOUNTER — Ambulatory Visit (INDEPENDENT_AMBULATORY_CARE_PROVIDER_SITE_OTHER): Payer: Medicare Other | Admitting: Family Medicine

## 2019-07-16 ENCOUNTER — Encounter: Payer: Self-pay | Admitting: Family Medicine

## 2019-07-16 VITALS — BP 120/70 | HR 54 | Temp 97.5°F | Ht 72.0 in | Wt 185.1 lb

## 2019-07-16 DIAGNOSIS — Z23 Encounter for immunization: Secondary | ICD-10-CM | POA: Diagnosis not present

## 2019-07-16 DIAGNOSIS — I10 Essential (primary) hypertension: Secondary | ICD-10-CM | POA: Diagnosis not present

## 2019-07-16 DIAGNOSIS — R42 Dizziness and giddiness: Secondary | ICD-10-CM | POA: Diagnosis not present

## 2019-07-16 DIAGNOSIS — N529 Male erectile dysfunction, unspecified: Secondary | ICD-10-CM

## 2019-07-16 DIAGNOSIS — N183 Chronic kidney disease, stage 3 unspecified: Secondary | ICD-10-CM

## 2019-07-16 DIAGNOSIS — F329 Major depressive disorder, single episode, unspecified: Secondary | ICD-10-CM

## 2019-07-16 DIAGNOSIS — E1159 Type 2 diabetes mellitus with other circulatory complications: Secondary | ICD-10-CM | POA: Diagnosis not present

## 2019-07-16 DIAGNOSIS — E785 Hyperlipidemia, unspecified: Secondary | ICD-10-CM | POA: Diagnosis not present

## 2019-07-16 DIAGNOSIS — F419 Anxiety disorder, unspecified: Secondary | ICD-10-CM

## 2019-07-16 MED ORDER — ESCITALOPRAM OXALATE 5 MG PO TABS: 5.0000 mg | ORAL_TABLET | Freq: Every day | ORAL | 2 refills | Status: DC

## 2019-07-16 MED ORDER — AMLODIPINE BESYLATE 5 MG PO TABS: 5.0000 mg | ORAL_TABLET | Freq: Every day | ORAL | 1 refills | Status: DC

## 2019-07-16 NOTE — Assessment & Plan Note (Addendum)
Patient's blood pressure does drop some on orthostatic testing.  His symptoms when he gets up in the morning could be related to a drop in blood pressure and thus we will have him decrease his amlodipine dose to 5 mg daily.  He will continue his lisinopril.  He will follow-up in 2 to 3 weeks for nurse BP check.

## 2019-07-16 NOTE — Assessment & Plan Note (Signed)
Check lipid panel  

## 2019-07-16 NOTE — Assessment & Plan Note (Signed)
Continue current regimen.  Check A1c.  I encouraged him to check his CBGs when he felt lightheaded.

## 2019-07-16 NOTE — Patient Instructions (Addendum)
Nice to see you. We will get lab work today and contact you with the results. Please rise very slowly from seated positions. We are going to decrease your amlodipine to 5 mg daily.  I sent this to your pharmacy. We are going to start you on Lexapro for anxiety and depression.  If you have worsening anxiety or depression or you have thoughts of harming yourself please seek medical attention immediately.

## 2019-07-16 NOTE — Progress Notes (Signed)
[name], MD Phone: [phone]  Zachary Santos is a 83 y.o. male who presents today for follow-up.  Anxiety/depression: Patient notes there are times where he feels like he wants to jump out of his skin.  It occurs 1-2 times a day and lasts for about 5 to 6 seconds and then he will go several days without feeling this way.  He has not checked his sugar when this occurs.  Depression screen [facility] 2/9 07/17/2019 07/16/2019 12/26/2018 11/08/2017 11/01/2016  Decreased Interest 3 0 0 0 0  Down, Depressed, Hopeless 1 0 1 0 0  PHQ - 2 Score 4 0 1 0 0  Altered sleeping 3 - - - -  Tired, decreased energy 3 - - - -  Change in appetite 3 - - - -  Feeling bad or failure about yourself  1 - - - -  Trouble concentrating 0 - - - -  Moving slowly or fidgety/restless 1 - - - -  Suicidal thoughts 0 - - - -  PHQ-9 Score 15 - - - -  Difficult doing work/chores Somewhat difficult - - - -   GAD 7 : Generalized Anxiety Score 07/17/2019  Nervous, Anxious, on Edge 1  Control/stop worrying 0  Worry too much - different things 1  Trouble relaxing 1  Restless 0  Easily annoyed or irritable 2  Afraid - awful might happen 0  Total GAD 7 Score 5    Lightheadedness: Patient notes when he gets up in the morning he feels as though he has to grab something for the first 5 to 10 seconds where he feels like he will fall over.  He is unsure if he feels lightheaded with this.  No vertigo.  No stumbling.  No confusion.  No disorientation.  He will hold onto something for 5 to 10 seconds and then he is able to go perfectly fine.  No presyncope or syncope.  Diabetes: He continues on glipizide and Tradjenta.  He does not check his sugar.  Hypertension: He continues on lisinopril and amlodipine.  No chest pain, shortness of breath, or palpitations.  Erectile dysfunction: Patient reports issues obtaining and maintaining an erection.  He is able to ejaculate.  He does have anxiety and depression.  Social History    Tobacco Use  Smoking Status Current Every Day Smoker  . Packs/day: 1.50  . Years: 60.00  . Pack years: 90.00  . Types: Cigarettes  Smokeless Tobacco Current User     ROS see history of present illness  Objective  Physical Exam Vitals:   07/16/19 1423  BP: 120/70  Pulse: (!) 54  Temp: (!) 97.5 F (36.4 C)  SpO2: 98%   Laying blood pressure 140/80 pulse 70 Sitting blood pressure 145/80 pulse 66 Standing blood pressure 130/70 pulse 69  BP Readings from Last 3 Encounters:  07/16/19 120/70  12/13/18 (!) 144/66  08/29/18 120/72   Wt Readings from Last 3 Encounters:  07/16/19 185 lb 1.9 oz (84 kg)  12/13/18 192 lb (87.1 kg)  08/29/18 190 lb 12.8 oz (86.5 kg)    Physical Exam Constitutional:      General: He is not in acute distress.    Appearance: He is not diaphoretic.  Cardiovascular:     Rate and Rhythm: Normal rate and regular rhythm.     Heart sounds: Normal heart sounds.     Comments: No carotid bruits Pulmonary:     Effort: Pulmonary effort is normal.     Breath  sounds: Normal breath sounds.  Musculoskeletal:     Right lower leg: No edema.     Left lower leg: No edema.  Skin:    General: Skin is warm and dry.  Neurological:     Mental Status: He is alert.     Comments: CN 3-12 intact, 5/5 strength in bilateral biceps, triceps, grip, quads, hamstrings, plantar and dorsiflexion, sensation to light touch intact in bilateral UE and LE, normal gait      Assessment/Plan: Please see individual problem list.  Essential hypertension, benign Patient's blood pressure does drop some on orthostatic testing.  His symptoms when he gets up in the morning could be related to a drop in blood pressure and thus we will have him decrease his amlodipine dose to 5 mg daily.  He will continue his lisinopril.  He will follow-up in 2 to 3 weeks for nurse BP check.  Diabetes type 2, controlled Continue current regimen.  Check A1c.  I encouraged him to check his CBGs when he  felt lightheaded.  Dyslipidemia Check lipid panel.  Anxiety and depression Patient with anxiety and depression based on PHQ 9.  Will start on low-dose Lexapro and follow-up in 2 months.  Erectile dysfunction Suspect anxiety and depression are playing a role in this.  We will proceed with treatment of that and then consider alternative therapies specifically for erectile dysfunction if not improving.   Orders Placed This Encounter  Procedures  . Flu Vaccine QUAD High Dose(Fluad)  . Comp Met (CMET)  . TSH  . HgB A1c  . CBC  . Lipid panel    Meds ordered this encounter  Medications  . amLODipine (NORVASC) 5 MG tablet    Sig: Take 1 tablet (5 mg total) by mouth daily.    Dispense:  90 tablet    Refill:  1  . escitalopram (LEXAPRO) 5 MG tablet    Sig: Take 1 tablet (5 mg total) by mouth daily.    Dispense:  30 tablet    Refill:  2     [name], MD [place]

## 2019-07-17 ENCOUNTER — Telehealth: Payer: Self-pay | Admitting: Family Medicine

## 2019-07-17 DIAGNOSIS — F329 Major depressive disorder, single episode, unspecified: Secondary | ICD-10-CM | POA: Insufficient documentation

## 2019-07-17 DIAGNOSIS — F419 Anxiety disorder, unspecified: Secondary | ICD-10-CM | POA: Insufficient documentation

## 2019-07-17 LAB — HEMOGLOBIN A1C: Hgb A1c MFr Bld: 8.5 % — ABNORMAL HIGH (ref 4.6–6.5)

## 2019-07-17 LAB — LIPID PANEL
Cholesterol: 134 mg/dL (ref 0–200)
HDL: 28.7 mg/dL — ABNORMAL LOW (ref >39.00)
LDL Cholesterol: 83 mg/dL (ref 0–99)
NonHDL: 105.01
Total CHOL/HDL Ratio: 5
Triglycerides: 110 mg/dL (ref 0.0–149.0)
VLDL: 22 mg/dL (ref 0.0–40.0)

## 2019-07-17 LAB — COMPREHENSIVE METABOLIC PANEL
ALT: 16 U/L (ref 0–53)
AST: 12 U/L (ref 0–37)
Albumin: 4.3 g/dL (ref 3.5–5.2)
Alkaline Phosphatase: 248 U/L — ABNORMAL HIGH (ref 39–117)
BUN: 11 mg/dL (ref 6–23)
CO2: 26 mEq/L (ref 19–32)
Calcium: 9.3 mg/dL (ref 8.4–10.5)
Chloride: 103 mEq/L (ref 96–112)
Creatinine, Ser: 1.35 mg/dL (ref 0.40–1.50)
GFR: 50.72 mL/min — ABNORMAL LOW (ref >60.00)
Glucose, Bld: 149 mg/dL — ABNORMAL HIGH (ref 70–99)
Potassium: 4.3 mEq/L (ref 3.5–5.1)
Sodium: 137 mEq/L (ref 135–145)
Total Bilirubin: 0.5 mg/dL (ref 0.2–1.2)
Total Protein: 6.7 g/dL (ref 6.0–8.3)

## 2019-07-17 LAB — CBC
HCT: 41.9 % (ref 39.0–52.0)
Hemoglobin: 14.1 g/dL (ref 13.0–17.0)
MCHC: 33.8 g/dL (ref 30.0–36.0)
MCV: 92.5 fl (ref 78.0–100.0)
Platelets: 171 10*3/uL (ref 150.0–400.0)
RBC: 4.53 Mil/uL (ref 4.22–5.81)
RDW: 14.2 % (ref 11.5–15.5)
WBC: 7.9 10*3/uL (ref 4.0–10.5)

## 2019-07-17 LAB — TSH: TSH: 1.51 u[IU]/mL (ref 0.35–4.50)

## 2019-07-17 NOTE — Telephone Encounter (Signed)
Called and spoke with the patient and scheduled a appointment for nurse visit in 3 weeks and follow up with provider in 2 months.  Nina,cma

## 2019-07-17 NOTE — Assessment & Plan Note (Signed)
Suspect anxiety and depression are playing a role in this.  We will proceed with treatment of that and then consider alternative therapies specifically for erectile dysfunction if not improving.

## 2019-07-17 NOTE — Telephone Encounter (Signed)
This patient left yesterday without scheduling follow-up.  He needs a BP check with nursing in 2 to 3 weeks.  He needs follow-up with me in 2 months.  Thanks.

## 2019-07-17 NOTE — Assessment & Plan Note (Signed)
Patient with anxiety and depression based on PHQ 9.  Will start on low-dose Lexapro and follow-up in 2 months.

## 2019-07-18 ENCOUNTER — Other Ambulatory Visit: Payer: Self-pay | Admitting: Family Medicine

## 2019-07-21 ENCOUNTER — Other Ambulatory Visit: Payer: Self-pay | Admitting: Family Medicine

## 2019-07-21 DIAGNOSIS — M889 Osteitis deformans of unspecified bone: Secondary | ICD-10-CM

## 2019-07-21 DIAGNOSIS — E785 Hyperlipidemia, unspecified: Secondary | ICD-10-CM

## 2019-07-21 MED ORDER — ROSUVASTATIN CALCIUM 20 MG PO TABS: 20.0000 mg | ORAL_TABLET | Freq: Every day | ORAL | 3 refills | Status: DC

## 2019-07-21 MED ORDER — METFORMIN HCL 500 MG PO TABS: 500.0000 mg | ORAL_TABLET | Freq: Two times a day (BID) | ORAL | 3 refills | Status: DC

## 2019-07-21 NOTE — Progress Notes (Signed)
met 

## 2019-07-24 DIAGNOSIS — H2513 Age-related nuclear cataract, bilateral: Secondary | ICD-10-CM | POA: Diagnosis not present

## 2019-07-24 DIAGNOSIS — H5203 Hypermetropia, bilateral: Secondary | ICD-10-CM | POA: Diagnosis not present

## 2019-07-24 DIAGNOSIS — H401231 Low-tension glaucoma, bilateral, mild stage: Secondary | ICD-10-CM | POA: Diagnosis not present

## 2019-07-24 DIAGNOSIS — H52223 Regular astigmatism, bilateral: Secondary | ICD-10-CM | POA: Diagnosis not present

## 2019-07-24 DIAGNOSIS — H524 Presbyopia: Secondary | ICD-10-CM | POA: Diagnosis not present

## 2019-07-31 DIAGNOSIS — H2513 Age-related nuclear cataract, bilateral: Secondary | ICD-10-CM | POA: Diagnosis not present

## 2019-07-31 DIAGNOSIS — H5203 Hypermetropia, bilateral: Secondary | ICD-10-CM | POA: Diagnosis not present

## 2019-07-31 DIAGNOSIS — H52223 Regular astigmatism, bilateral: Secondary | ICD-10-CM | POA: Diagnosis not present

## 2019-07-31 DIAGNOSIS — H524 Presbyopia: Secondary | ICD-10-CM | POA: Diagnosis not present

## 2019-07-31 DIAGNOSIS — H401231 Low-tension glaucoma, bilateral, mild stage: Secondary | ICD-10-CM | POA: Diagnosis not present

## 2019-08-01 ENCOUNTER — Ambulatory Visit: Payer: Medicare Other

## 2019-08-06 ENCOUNTER — Encounter: Payer: Self-pay | Admitting: *Deleted

## 2019-08-06 ENCOUNTER — Ambulatory Visit (INDEPENDENT_AMBULATORY_CARE_PROVIDER_SITE_OTHER): Payer: Medicare Other | Admitting: *Deleted

## 2019-08-06 ENCOUNTER — Other Ambulatory Visit: Payer: Self-pay

## 2019-08-06 VITALS — BP 130/78 | HR 64 | Temp 97.8°F | Resp 20

## 2019-08-06 DIAGNOSIS — I10 Essential (primary) hypertension: Secondary | ICD-10-CM

## 2019-08-06 NOTE — Progress Notes (Signed)
Patient here for nurse visit BP check per order from 07/16/19 Office visit.   Patient reports compliance with prescribed BP medications: yes, Amlodipine 5 mg decreased on 07/16/19.   Last dose of BP medication: Today at around 8 AM  BP Readings from Last 3 Encounters:  08/06/19 (!) 148/64  07/16/19 120/70  12/13/18 (!) 144/66   Pulse Readings from Last 3 Encounters:  08/06/19 63  07/16/19 (!) 54  12/13/18 70     Patient verbalized understanding of instructions.   Kerin Salen, RN

## 2019-08-07 NOTE — Progress Notes (Signed)
BP is acceptable for his age particularly with the low normal diastolic BP. Please see if he has been checking his BP at home.  [name], MD

## 2019-08-10 NOTE — Progress Notes (Signed)
Patient voiced understanding to BP, patient does not check readings at home.

## 2019-08-21 ENCOUNTER — Other Ambulatory Visit: Payer: Self-pay

## 2019-08-22 ENCOUNTER — Other Ambulatory Visit: Payer: Self-pay

## 2019-08-22 ENCOUNTER — Encounter: Payer: Self-pay | Admitting: Family Medicine

## 2019-08-22 ENCOUNTER — Ambulatory Visit (INDEPENDENT_AMBULATORY_CARE_PROVIDER_SITE_OTHER): Payer: Medicare Other | Admitting: Family Medicine

## 2019-08-22 DIAGNOSIS — I10 Essential (primary) hypertension: Secondary | ICD-10-CM

## 2019-08-22 DIAGNOSIS — E785 Hyperlipidemia, unspecified: Secondary | ICD-10-CM

## 2019-08-22 DIAGNOSIS — M889 Osteitis deformans of unspecified bone: Secondary | ICD-10-CM | POA: Diagnosis not present

## 2019-08-22 DIAGNOSIS — F419 Anxiety disorder, unspecified: Secondary | ICD-10-CM

## 2019-08-22 DIAGNOSIS — F329 Major depressive disorder, single episode, unspecified: Secondary | ICD-10-CM

## 2019-08-22 NOTE — Patient Instructions (Addendum)
Nice to see you. Please take the lexapro every day.  Please keep your appointment for lab work later this month.

## 2019-08-22 NOTE — Assessment & Plan Note (Signed)
Continue Crestor.  He will return for labs later this month.

## 2019-08-22 NOTE — Assessment & Plan Note (Signed)
Adequate control. Continue current regimen.  

## 2019-08-22 NOTE — Assessment & Plan Note (Signed)
Improved.  Discussed taking Lexapro daily.  He will let us know if he has side effects.

## 2019-08-22 NOTE — Progress Notes (Signed)
  [name], MD Phone: [phone]  Zachary Santos is a 83 y.o. male who presents today for follow-up.  Anxiety/depression: Patient notes no significant anxiety or depression at this time.  He has only been taking the Lexapro about 4 days a week.  He does not feel like he needs to take it every day.  He has had no side effects.  No SI.  Hypertension: The highest it has been since decreasing his amlodipine dose is 150/88.  Typically decently well controlled.  Taking lisinopril as well.  No chest pain.  No changes to his chronic shortness of breath.  His lightheadedness is resolved.  Hyperlipidemia: Taking Crestor.  No myalgias or right upper quadrant pain.  Social History   Tobacco Use  Smoking Status Current Every Day Smoker  . Packs/day: 1.50  . Years: 60.00  . Pack years: 90.00  . Types: Cigarettes  Smokeless Tobacco Current User     ROS see history of present illness  Objective  Physical Exam Vitals:   08/22/19 1423  BP: 120/70  Pulse: 64  Temp: (!) 97.5 F (36.4 C)  SpO2: 95%    BP Readings from Last 3 Encounters:  08/22/19 120/70  08/06/19 130/78  07/16/19 120/70   Wt Readings from Last 3 Encounters:  08/22/19 185 lb 6.4 oz (84.1 kg)  07/16/19 185 lb 1.9 oz (84 kg)  12/13/18 192 lb (87.1 kg)    Physical Exam Constitutional:      General: He is not in acute distress.    Appearance: He is not diaphoretic.  Cardiovascular:     Rate and Rhythm: Normal rate and regular rhythm.     Heart sounds: Normal heart sounds.  Pulmonary:     Effort: Pulmonary effort is normal.     Breath sounds: Normal breath sounds.  Musculoskeletal:     Right lower leg: No edema.     Left lower leg: No edema.  Skin:    General: Skin is warm and dry.  Neurological:     Mental Status: He is alert.      Assessment/Plan: Please see individual problem list.  Essential hypertension, benign Adequate control.  Continue current regimen.  Paget's disease of bone Patient  has an appointment with endocrinology this month.  He will keep that.  Anxiety and depression Improved.  Discussed taking Lexapro daily.  He will let us know if he has side effects.  Dyslipidemia Continue Crestor.  He will return for labs later this month.   No orders of the defined types were placed in this encounter.   No orders of the defined types were placed in this encounter.    [name], MD [place]

## 2019-08-22 NOTE — Assessment & Plan Note (Signed)
Patient has an appointment with endocrinology this month.  He will keep that.

## 2019-09-03 ENCOUNTER — Ambulatory Visit: Payer: Medicare Other

## 2019-10-31 DIAGNOSIS — H2513 Age-related nuclear cataract, bilateral: Secondary | ICD-10-CM | POA: Diagnosis not present

## 2019-10-31 DIAGNOSIS — H524 Presbyopia: Secondary | ICD-10-CM | POA: Diagnosis not present

## 2019-10-31 DIAGNOSIS — H401231 Low-tension glaucoma, bilateral, mild stage: Secondary | ICD-10-CM | POA: Diagnosis not present

## 2019-10-31 DIAGNOSIS — H52223 Regular astigmatism, bilateral: Secondary | ICD-10-CM | POA: Diagnosis not present

## 2019-10-31 DIAGNOSIS — H5203 Hypermetropia, bilateral: Secondary | ICD-10-CM | POA: Diagnosis not present

## 2019-11-15 DIAGNOSIS — H2513 Age-related nuclear cataract, bilateral: Secondary | ICD-10-CM | POA: Diagnosis not present

## 2019-11-16 DIAGNOSIS — R079 Chest pain, unspecified: Secondary | ICD-10-CM | POA: Diagnosis not present

## 2019-11-16 DIAGNOSIS — R0789 Other chest pain: Secondary | ICD-10-CM | POA: Diagnosis not present

## 2019-11-16 DIAGNOSIS — I10 Essential (primary) hypertension: Secondary | ICD-10-CM | POA: Diagnosis not present

## 2019-11-23 ENCOUNTER — Ambulatory Visit (INDEPENDENT_AMBULATORY_CARE_PROVIDER_SITE_OTHER): Payer: Medicare Other | Admitting: Family Medicine

## 2019-11-23 ENCOUNTER — Other Ambulatory Visit: Payer: Self-pay

## 2019-11-23 ENCOUNTER — Encounter: Payer: Self-pay | Admitting: Family Medicine

## 2019-11-23 DIAGNOSIS — E1159 Type 2 diabetes mellitus with other circulatory complications: Secondary | ICD-10-CM

## 2019-11-23 DIAGNOSIS — N401 Enlarged prostate with lower urinary tract symptoms: Secondary | ICD-10-CM

## 2019-11-23 DIAGNOSIS — G4700 Insomnia, unspecified: Secondary | ICD-10-CM | POA: Insufficient documentation

## 2019-11-23 DIAGNOSIS — I10 Essential (primary) hypertension: Secondary | ICD-10-CM | POA: Diagnosis not present

## 2019-11-23 DIAGNOSIS — F419 Anxiety disorder, unspecified: Secondary | ICD-10-CM

## 2019-11-23 DIAGNOSIS — F329 Major depressive disorder, single episode, unspecified: Secondary | ICD-10-CM

## 2019-11-23 DIAGNOSIS — R351 Nocturia: Secondary | ICD-10-CM

## 2019-11-23 MED ORDER — TRAZODONE HCL 50 MG PO TABS: 25.0000 mg | ORAL_TABLET | Freq: Every evening | ORAL | 0 refills | Status: DC | PRN

## 2019-11-23 NOTE — Assessment & Plan Note (Signed)
Generally well controlled.  He will continue his current regimen.  He will come in for nurse BP check and lab work.

## 2019-11-23 NOTE — Assessment & Plan Note (Signed)
Discussed trial of coming off of Flomax to see how he does.  Discussed that there is some risk of intraoperative floppy iris syndrome and he should not be taking this medication when he has his cataract surgery.  If he has worsening issues with his prostate when coming off of the Flomax he can let us know we can try an alternative medication.

## 2019-11-23 NOTE — Progress Notes (Signed)
Virtual Visit via telephone Note  This visit type was conducted due to national recommendations for restrictions regarding the COVID-19 pandemic (e.g. social distancing).  This format is felt to be most appropriate for this patient at this time.  All issues noted in this document were discussed and addressed.  No physical exam was performed (except for noted visual exam findings with Video Visits).   I connected with Zachary Santos today at  4:00 PM EST by telephone and verified that I am speaking with the correct person using two identifiers. Location patient: home Location provider: work  Persons participating in the virtual visit: patient, provider  I discussed the limitations, risks, security and privacy concerns of performing an evaluation and management service by telephone and the availability of in person appointments. I also discussed with the patient that there may be a patient responsible charge related to this service. The patient expressed understanding and agreed to proceed.  Interactive audio and video telecommunications were attempted between this provider and patient, however failed, due to patient having technical difficulties OR patient did not have access to video capability.  We continued and completed visit with audio only.   Reason for visit: follow-up  HPI: Hypertension: Typically 120s-140s/70s.  He did have one episode where it went up to 181/100.  He felt a little jittery when that occurred and he had EMS come evaluate him and his blood pressure was elevated.  They waited with him until it came down.  He is on amlodipine and lisinopril.  No chest pain or shortness of breath.  He has chronic intermittent edema if he sits for too long.  Anxiety/depression: Patient notes he is on Lexapro.  He notes no anxiety or depression.  Insomnia: Patient notes he has gotten his days turned around.  He will stay up watching TV until 3 or 4 in the morning and then sleep during the day.   When he tries to go to bed at a normal hour he cannot sleep.  He does drink soda and coffee throughout the day with some soda later in the day.  No alcohol intake.  When he tries to stay awake during the day he will fall asleep if he is watching TV.  Diabetes: Taking glipizide and Tradjenta and Metformin.  Not checking sugars.   BPH: Patient notes his Flomax has been helpful.  He is having cataract surgery later this month and notes the ophthalmologist discussed that the Flomax could cause an issue during surgery.   ROS: See pertinent positives and negatives per HPI.  Past Medical History:  Diagnosis Date  . Chronic fatigue   . CKD (chronic kidney disease), stage III   . Claudication [facility])    a. R Hip.  [name] COPD (chronic obstructive pulmonary disease) ([place])   . Essential hypertension    a. 01/2008 Echo: EF 65%, no rwma, mod increased PASP.  [name] Osteoarthritis   . Paget's disease   . Syncope    a. 03/2015 in setting of hypotension.  . Tobacco abuse    a. 1-1.5 ppd x 60+ yrs.  . Type II diabetes mellitus ([place])     Past Surgical History:  Procedure Laterality Date  . APPENDECTOMY    . laparscopic chole    . SPINE SURGERY     cervical spine surgery  . trigeminal neuralgia     with surgery in [place]    Family History  Problem Relation Age of Onset  . Cancer Mother  died @ 60.  [name] COPD Father        died @ 17.  . Diabetes Brother        died in early 70's.    SOCIAL HX: Smoker   Current Outpatient Medications:  .  amLODipine (NORVASC) 5 MG tablet, Take 1 tablet (5 mg total) by mouth daily., Disp: 90 tablet, Rfl: 1 .  aspirin 81 MG EC tablet, Take 1 tablet (81 mg total) by mouth daily., Disp: 30 tablet, Rfl: 6 .  escitalopram (LEXAPRO) 5 MG tablet, Take 1 tablet (5 mg total) by mouth daily., Disp: 30 tablet, Rfl: 2 .  glipiZIDE (GLUCOTROL XL) 2.5 MG 24 hr tablet, TAKE ONE TABLET BY MOUTH ONCE DAILY WITH BREAKFAST, Disp: 90 tablet, Rfl: 3 .  latanoprost (XALATAN)  0.005 % ophthalmic solution, INSTILL 1 DROP INTO EACH EYE AT BEDTIME, Disp: , Rfl:  .  linagliptin (TRADJENTA) 5 MG TABS tablet, Take 1 tablet (5 mg total) by mouth daily., Disp: 90 tablet, Rfl: 3 .  lisinopril (PRINIVIL,ZESTRIL) 20 MG tablet, Take 1 tablet (20 mg total) by mouth daily., Disp: 90 tablet, Rfl: 3 .  metFORMIN (GLUCOPHAGE) 500 MG tablet, Take 1 tablet (500 mg total) by mouth 2 (two) times daily with a meal., Disp: 180 tablet, Rfl: 3 .  nicotine (NICODERM CQ - DOSED IN MG/24 HOURS) 21 mg/24hr patch, Place 1 patch (21 mg total) onto the skin daily. Daily for 6 weeks, then call for refill, Disp: 42 patch, Rfl: 0 .  ONE TOUCH ULTRA TEST test strip, USE TEST STRIP TO CHECK GLUCOSE AT LEAST ONCE DAILY, Disp: 50 each, Rfl: 11 .  rosuvastatin (CRESTOR) 20 MG tablet, Take 1 tablet (20 mg total) by mouth daily., Disp: 90 tablet, Rfl: 3 .  tamsulosin (FLOMAX) 0.4 MG CAPS capsule, Take 2 capsules by mouth once daily, Disp: 180 capsule, Rfl: 0 .  triamcinolone ointment (KENALOG) 0.1 %, APPLY  OINTMENT TOPICALLY TWICE DAILY, Disp: 80 g, Rfl: 3 .  traZODone (DESYREL) 50 MG tablet, Take 0.5-1 tablets (25-50 mg total) by mouth at bedtime as needed for sleep., Disp: 10 tablet, Rfl: 0  EXAM: This is a telehealth telephone visit and thus no physical exam was completed.  ASSESSMENT AND PLAN:  Discussed the following assessment and plan:  Essential hypertension, benign Generally well controlled.  He will continue his current regimen.  He will come in for nurse BP check and lab work.  Diabetes type 2, controlled Undetermined control.  Continue current regimen.  Check A1c.  BPH (benign prostatic hyperplasia) Discussed trial of coming off of Flomax to see how he does.  Discussed that there is some risk of intraoperative floppy iris syndrome and he should not be taking this medication when he has his cataract surgery.  If he has worsening issues with his prostate when coming off of the Flomax he can let  us know we can try an alternative medication.  Anxiety and depression Asymptomatic.  Continue Lexapro.  Insomnia The patient's sleep schedule is off.  I discussed that he needs to try to stay awake during the day to reset his sleep schedule.  He needs to decrease his caffeine intake and shifted to earlier in the day.  We can try a low-dose of trazodone to see if that would be beneficial in the short-term.  Discussed that this would not be a long-term medication.  Advised to let us know if he becomes excessively drowsy with this or if he has any other possible  side effects.   Orders Placed This Encounter  Procedures  . Lipid panel    Standing Status:   Future    Standing Expiration Date:   11/22/2020  . Comp Met (CMET)    Standing Status:   Future    Standing Expiration Date:   11/22/2020  . HgB A1c    Standing Status:   Future    Standing Expiration Date:   11/22/2020    Meds ordered this encounter  Medications  . traZODone (DESYREL) 50 MG tablet    Sig: Take 0.5-1 tablets (25-50 mg total) by mouth at bedtime as needed for sleep.    Dispense:  10 tablet    Refill:  0     I discussed the assessment and treatment plan with the patient. The patient was provided an opportunity to ask questions and all were answered. The patient agreed with the plan and demonstrated an understanding of the instructions.   The patient was advised to call back or seek an in-person evaluation if the symptoms worsen or if the condition fails to improve as anticipated.  I provided 16 minutes of non-face-to-face time during this encounter.   [name], MD

## 2019-11-23 NOTE — Assessment & Plan Note (Signed)
Asymptomatic.  Continue Lexapro. 

## 2019-11-23 NOTE — Assessment & Plan Note (Signed)
Undetermined control.  Continue current regimen.  Check A1c.

## 2019-11-23 NOTE — Assessment & Plan Note (Signed)
The patient's sleep schedule is off.  I discussed that he needs to try to stay awake during the day to reset his sleep schedule.  He needs to decrease his caffeine intake and shifted to earlier in the day.  We can try a low-dose of trazodone to see if that would be beneficial in the short-term.  Discussed that this would not be a long-term medication.  Advised to let us know if he becomes excessively drowsy with this or if he has any other possible side effects.

## 2019-12-03 ENCOUNTER — Encounter: Payer: Self-pay | Admitting: Ophthalmology

## 2019-12-03 DIAGNOSIS — H2512 Age-related nuclear cataract, left eye: Secondary | ICD-10-CM | POA: Diagnosis not present

## 2019-12-03 DIAGNOSIS — E1159 Type 2 diabetes mellitus with other circulatory complications: Secondary | ICD-10-CM | POA: Diagnosis not present

## 2019-12-05 ENCOUNTER — Other Ambulatory Visit: Payer: Self-pay | Admitting: Family Medicine

## 2019-12-07 ENCOUNTER — Other Ambulatory Visit
Admission: RE | Admit: 2019-12-07 | Discharge: 2019-12-07 | Disposition: A | Discharge status: Home / Self Care | Payer: Medicare Other | Source: Ambulatory Visit | Attending: Ophthalmology | Admitting: Ophthalmology

## 2019-12-07 DIAGNOSIS — Z20822 Contact with and (suspected) exposure to covid-19: Secondary | ICD-10-CM | POA: Insufficient documentation

## 2019-12-07 DIAGNOSIS — Z01812 Encounter for preprocedural laboratory examination: Secondary | ICD-10-CM | POA: Diagnosis not present

## 2019-12-07 LAB — SARS CORONAVIRUS 2 (TAT 6-24 HRS): SARS Coronavirus 2: NEGATIVE

## 2019-12-07 NOTE — Discharge Instructions (Signed)

## 2019-12-11 ENCOUNTER — Other Ambulatory Visit: Payer: Self-pay

## 2019-12-11 ENCOUNTER — Encounter
Admission: RE | Disposition: A | Discharge status: Home / Self Care | Payer: Self-pay | Source: Home / Self Care | Attending: Ophthalmology

## 2019-12-11 ENCOUNTER — Ambulatory Visit: Payer: Medicare Other | Admitting: Anesthesiology

## 2019-12-11 ENCOUNTER — Ambulatory Visit
Admission: RE | Admit: 2019-12-11 | Discharge: 2019-12-11 | Disposition: A | Discharge status: Home / Self Care | Payer: Medicare Other | Attending: Ophthalmology | Admitting: Ophthalmology

## 2019-12-11 ENCOUNTER — Encounter: Payer: Self-pay | Admitting: Ophthalmology

## 2019-12-11 DIAGNOSIS — H409 Unspecified glaucoma: Secondary | ICD-10-CM | POA: Diagnosis not present

## 2019-12-11 DIAGNOSIS — E1136 Type 2 diabetes mellitus with diabetic cataract: Secondary | ICD-10-CM | POA: Insufficient documentation

## 2019-12-11 DIAGNOSIS — F17200 Nicotine dependence, unspecified, uncomplicated: Secondary | ICD-10-CM | POA: Insufficient documentation

## 2019-12-11 DIAGNOSIS — J449 Chronic obstructive pulmonary disease, unspecified: Secondary | ICD-10-CM | POA: Diagnosis not present

## 2019-12-11 DIAGNOSIS — N400 Enlarged prostate without lower urinary tract symptoms: Secondary | ICD-10-CM | POA: Insufficient documentation

## 2019-12-11 DIAGNOSIS — H25812 Combined forms of age-related cataract, left eye: Secondary | ICD-10-CM | POA: Diagnosis not present

## 2019-12-11 DIAGNOSIS — Z7984 Long term (current) use of oral hypoglycemic drugs: Secondary | ICD-10-CM | POA: Diagnosis not present

## 2019-12-11 DIAGNOSIS — Z7982 Long term (current) use of aspirin: Secondary | ICD-10-CM | POA: Diagnosis not present

## 2019-12-11 DIAGNOSIS — I129 Hypertensive chronic kidney disease with stage 1 through stage 4 chronic kidney disease, or unspecified chronic kidney disease: Secondary | ICD-10-CM | POA: Diagnosis not present

## 2019-12-11 DIAGNOSIS — E7800 Pure hypercholesterolemia, unspecified: Secondary | ICD-10-CM | POA: Insufficient documentation

## 2019-12-11 DIAGNOSIS — F419 Anxiety disorder, unspecified: Secondary | ICD-10-CM | POA: Insufficient documentation

## 2019-12-11 DIAGNOSIS — N182 Chronic kidney disease, stage 2 (mild): Secondary | ICD-10-CM | POA: Diagnosis not present

## 2019-12-11 DIAGNOSIS — Z79899 Other long term (current) drug therapy: Secondary | ICD-10-CM | POA: Insufficient documentation

## 2019-12-11 DIAGNOSIS — E1122 Type 2 diabetes mellitus with diabetic chronic kidney disease: Secondary | ICD-10-CM | POA: Insufficient documentation

## 2019-12-11 DIAGNOSIS — H2512 Age-related nuclear cataract, left eye: Secondary | ICD-10-CM | POA: Insufficient documentation

## 2019-12-11 HISTORY — DX: Presence of dental prosthetic device (complete) (partial): Z97.2

## 2019-12-11 HISTORY — PX: CATARACT EXTRACTION W/PHACO: SHX586

## 2019-12-11 LAB — GLUCOSE, CAPILLARY
Glucose-Capillary: 220 mg/dL — ABNORMAL HIGH (ref 70–99)
Glucose-Capillary: 216 mg/dL — ABNORMAL HIGH (ref 70–99)

## 2019-12-11 SURGERY — PHACOEMULSIFICATION, CATARACT, WITH IOL INSERTION
Anesthesia: Monitor Anesthesia Care | Site: Eye | Laterality: Left

## 2019-12-11 MED ORDER — ARMC OPHTHALMIC DILATING DROPS
1.0000 "application " | OPHTHALMIC | Status: DC | PRN
  Administered 2019-12-11 (×3): 1 via OPHTHALMIC

## 2019-12-11 MED ORDER — LIDOCAINE HCL (PF) 2 % IJ SOLN
INTRAOCULAR | Status: DC | PRN
  Administered 2019-12-11: 1 mL

## 2019-12-11 MED ORDER — OXYCODONE HCL 5 MG/5ML PO SOLN: 5.0000 mg | Freq: Once | ORAL | Status: DC | PRN

## 2019-12-11 MED ORDER — OXYCODONE HCL 5 MG PO TABS: 5.0000 mg | ORAL_TABLET | Freq: Once | ORAL | Status: DC | PRN

## 2019-12-11 MED ORDER — MOXIFLOXACIN HCL 0.5 % OP SOLN
OPHTHALMIC | Status: DC | PRN
  Administered 2019-12-11: 0.2 mL via OPHTHALMIC

## 2019-12-11 MED ORDER — LACTATED RINGERS IV SOLN: 100.0000 mL/h | INTRAVENOUS | Status: DC

## 2019-12-11 MED ORDER — EPINEPHRINE PF 1 MG/ML IJ SOLN
INTRAOCULAR | Status: DC | PRN
  Administered 2019-12-11: 84 mL via OPHTHALMIC

## 2019-12-11 MED ORDER — BRIMONIDINE TARTRATE-TIMOLOL 0.2-0.5 % OP SOLN
OPHTHALMIC | Status: DC | PRN
  Administered 2019-12-11: 1 [drp] via OPHTHALMIC

## 2019-12-11 MED ORDER — TETRACAINE HCL 0.5 % OP SOLN
1.0000 [drp] | OPHTHALMIC | Status: DC | PRN
  Administered 2019-12-11 (×3): 1 [drp] via OPHTHALMIC

## 2019-12-11 MED ORDER — FENTANYL CITRATE (PF) 100 MCG/2ML IJ SOLN
INTRAMUSCULAR | Status: DC | PRN
  Administered 2019-12-11: 50 ug via INTRAVENOUS

## 2019-12-11 MED ORDER — NA CHONDROIT SULF-NA HYALURON 40-17 MG/ML IO SOLN
INTRAOCULAR | Status: DC | PRN
  Administered 2019-12-11: 1 mL via INTRAOCULAR

## 2019-12-11 MED ORDER — MIDAZOLAM HCL 2 MG/2ML IJ SOLN
INTRAMUSCULAR | Status: DC | PRN
  Administered 2019-12-11: 1 mg via INTRAVENOUS

## 2019-12-11 MED ORDER — LACTATED RINGERS IV SOLN: INTRAVENOUS | Status: DC

## 2019-12-11 SURGICAL SUPPLY — 22 items
CANNULA ANT/CHMB 27G (MISCELLANEOUS) ×2 IMPLANT
CANNULA ANT/CHMB 27GA (MISCELLANEOUS) ×4 IMPLANT
GLOVE SURG LX 8.0 MICRO (GLOVE) ×2
GLOVE SURG LX STRL 8.0 MICRO (GLOVE) ×1 IMPLANT
GLOVE SURG TRIUMPH 8.0 PF LTX (GLOVE) ×2 IMPLANT
GOWN STRL REUS W/ TWL LRG LVL3 (GOWN DISPOSABLE) ×2 IMPLANT
GOWN STRL REUS W/TWL LRG LVL3 (GOWN DISPOSABLE) ×4
LENS IOL TECNIS ITEC 19.5 (Intraocular Lens) ×1 IMPLANT
MARKER SKIN DUAL TIP RULER LAB (MISCELLANEOUS) ×2 IMPLANT
NDL FILTER BLUNT 18X1 1/2 (NEEDLE) ×1 IMPLANT
NDL RETROBULBAR .5 NSTRL (NEEDLE) ×2 IMPLANT
NEEDLE FILTER BLUNT 18X 1/2SAF (NEEDLE) ×1
NEEDLE FILTER BLUNT 18X1 1/2 (NEEDLE) ×1 IMPLANT
PACK EYE AFTER SURG (MISCELLANEOUS) ×2 IMPLANT
PACK OPTHALMIC (MISCELLANEOUS) ×2 IMPLANT
PACK PORFILIO (MISCELLANEOUS) ×2 IMPLANT
SUT ETHILON 10-0 CS-B-6CS-B-6 (SUTURE)
SUTURE EHLN 10-0 CS-B-6CS-B-6 (SUTURE) IMPLANT
SYR 3ML LL SCALE MARK (SYRINGE) ×2 IMPLANT
SYR TB 1ML LUER SLIP (SYRINGE) ×2 IMPLANT
WATER STERILE IRR 250ML POUR (IV SOLUTION) ×2 IMPLANT
WIPE NON LINTING 3.25X3.25 (MISCELLANEOUS) ×2 IMPLANT

## 2019-12-11 NOTE — H&P (Signed)
All labs reviewed. Abnormal studies sent to patients PCP when indicated.  Previous H&P reviewed, patient examined, there are NO CHANGES.  Zachary Woolford Porfilio2/23/20219:36 AM

## 2019-12-11 NOTE — Transfer of Care (Signed)
Immediate Anesthesia Transfer of Care Note  Patient: Zachary Santos  Procedure(s) Performed: CATARACT EXTRACTION PHACO AND INTRAOCULAR LENS PLACEMENT (IOC) LEFT DIABETIC MALYUGIN (Left Eye)  Patient Location: PACU  Anesthesia Type: MAC  Level of Consciousness: awake, alert  and patient cooperative  Airway and Oxygen Therapy: Patient Spontanous Breathing and Patient connected to supplemental oxygen  Post-op Assessment: Post-op Vital signs reviewed, Patient's Cardiovascular Status Stable, Respiratory Function Stable, Patent Airway and No signs of Nausea or vomiting  Post-op Vital Signs: Reviewed and stable  Complications: No apparent anesthesia complications

## 2019-12-11 NOTE — Anesthesia Postprocedure Evaluation (Signed)
Anesthesia Post Note  Patient: Zachary Santos  Procedure(s) Performed: CATARACT EXTRACTION PHACO AND INTRAOCULAR LENS PLACEMENT (IOC) LEFT DIABETIC MALYUGIN (Left Eye)     Patient location during evaluation: PACU Anesthesia Type: MAC Level of consciousness: awake and alert Pain management: pain level controlled Vital Signs Assessment: post-procedure vital signs reviewed and stable Respiratory status: spontaneous breathing, nonlabored ventilation, respiratory function stable and patient connected to nasal cannula oxygen Cardiovascular status: stable and blood pressure returned to baseline Postop Assessment: no apparent nausea or vomiting Anesthetic complications: no    Sebert Stollings

## 2019-12-11 NOTE — Op Note (Signed)
PREOPERATIVE DIAGNOSIS:  Nuclear sclerotic cataract of the left eye.   POSTOPERATIVE DIAGNOSIS:  Nuclear sclerotic cataract of the left eye.   OPERATIVE PROCEDURE:@   SURGEON:  Birder Robson, MD.   ANESTHESIA:  Anesthesiologist: Fidel Levy, MD CRNA: Mayme Genta, CRNA  1.      Managed anesthesia care. 2.     0.12ml of Shugarcaine was instilled following the paracentesis   COMPLICATIONS:  None.   TECHNIQUE:   Stop and chop   DESCRIPTION OF PROCEDURE:  The patient was examined and consented in the preoperative holding area where the aforementioned topical anesthesia was applied to the left eye and then brought back to the Operating Room where the left eye was prepped and draped in the usual sterile ophthalmic fashion and a lid speculum was placed. A paracentesis was created with the side port blade and the anterior chamber was filled with viscoelastic. A near clear corneal incision was performed with the steel keratome. A continuous curvilinear capsulorrhexis was performed with a cystotome followed by the capsulorrhexis forceps. Hydrodissection and hydrodelineation were carried out with BSS on a blunt cannula. The lens was removed in a stop and chop  technique and the remaining cortical material was removed with the irrigation-aspiration handpiece. The capsular bag was inflated with viscoelastic and the Technis [id number] lens was placed in the capsular bag without complication. The remaining viscoelastic was removed from the eye with the irrigation-aspiration handpiece. The wounds were hydrated. The anterior chamber was flushed with BSS and the eye was inflated to physiologic pressure. 0.35ml Vigamox was placed in the anterior chamber. The wounds were found to be water tight. The eye was dressed with Combigan. The patient was given protective glasses to wear throughout the day and a shield with which to sleep tonight. The patient was also given drops with which to begin a drop regimen today and  will follow-up with me in one day. Implant Name Type Inv. Item Serial No. Manufacturer Lot No. LRB No. Used Action  LENS IOL DIOP 19.5 - [id number] Intraocular Lens LENS IOL DIOP 19.5 [id number]  Left 1 Implanted    Procedure(s) with comments: CATARACT EXTRACTION PHACO AND INTRAOCULAR LENS PLACEMENT (IOC) LEFT DIABETIC MALYUGIN (Left) - CDE 11.45 U/S  1:05.7  Electronically signed: Birder Robson 12/11/2019 10:08 AM

## 2019-12-11 NOTE — Anesthesia Procedure Notes (Signed)
Procedure Name: MAC Performed by: Frankie Scipio, CRNA Pre-anesthesia Checklist: Patient identified, Emergency Drugs available, Suction available, Timeout performed and Patient being monitored Patient Re-evaluated:Patient Re-evaluated prior to induction Oxygen Delivery Method: Nasal cannula Placement Confirmation: positive ETCO2       

## 2019-12-11 NOTE — Anesthesia Preprocedure Evaluation (Addendum)
Anesthesia Evaluation  Patient identified by MRN, date of birth, ID band Patient awake    Reviewed: NPO status   History of Anesthesia Complications Negative for: history of anesthetic complications  Airway Mallampati: II  TM Distance: >3 FB Neck ROM: full    Dental  (+) Upper Dentures, Missing   Pulmonary COPD (mild), Current SmokerPatient did not abstain from smoking.,    Pulmonary exam normal        Cardiovascular Exercise Tolerance: Good hypertension, Normal cardiovascular exam     Neuro/Psych Anxiety glaucoma    GI/Hepatic negative GI ROS, Neg liver ROS,   Endo/Other  diabetes  Renal/GU Renal disease (ckd2)   bph    Musculoskeletal  (+) Arthritis , 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  Reproductive/Obstetrics                            Anesthesia Physical Anesthesia Plan  ASA: III  Anesthesia Plan: MAC   Post-op Pain Management:    Induction:   PONV Risk Score and Plan: 1 and TIVA  Airway Management Planned:   Additional Equipment:   Intra-op Plan:   Post-operative Plan:   Informed Consent: I have reviewed the patients History and Physical, chart, labs and discussed the procedure including the risks, benefits and alternatives for the proposed anesthesia with the patient or authorized representative who has indicated his/her understanding and acceptance.       Plan Discussed with: CRNA  Anesthesia Plan Comments:         Anesthesia Quick Evaluation

## 2019-12-12 ENCOUNTER — Encounter: Payer: Self-pay | Admitting: *Deleted

## 2019-12-21 DIAGNOSIS — H2511 Age-related nuclear cataract, right eye: Secondary | ICD-10-CM | POA: Diagnosis not present

## 2020-01-01 ENCOUNTER — Encounter: Payer: Self-pay | Admitting: Ophthalmology

## 2020-01-04 ENCOUNTER — Other Ambulatory Visit
Admission: RE | Admit: 2020-01-04 | Discharge: 2020-01-04 | Disposition: A | Discharge status: Home / Self Care | Payer: Medicare Other | Source: Ambulatory Visit | Attending: Ophthalmology | Admitting: Ophthalmology

## 2020-01-04 ENCOUNTER — Other Ambulatory Visit: Payer: Self-pay

## 2020-01-04 DIAGNOSIS — Z01812 Encounter for preprocedural laboratory examination: Secondary | ICD-10-CM | POA: Insufficient documentation

## 2020-01-04 DIAGNOSIS — Z20822 Contact with and (suspected) exposure to covid-19: Secondary | ICD-10-CM | POA: Insufficient documentation

## 2020-01-05 LAB — SARS CORONAVIRUS 2 (TAT 6-24 HRS): SARS Coronavirus 2: NEGATIVE

## 2020-01-07 NOTE — Discharge Instructions (Signed)

## 2020-01-08 ENCOUNTER — Ambulatory Visit: Payer: Medicare Other | Admitting: Anesthesiology

## 2020-01-08 ENCOUNTER — Encounter
Admission: RE | Disposition: A | Discharge status: Home / Self Care | Payer: Self-pay | Source: Ambulatory Visit | Attending: Ophthalmology

## 2020-01-08 ENCOUNTER — Encounter: Payer: Self-pay | Admitting: Ophthalmology

## 2020-01-08 ENCOUNTER — Ambulatory Visit
Admission: RE | Admit: 2020-01-08 | Discharge: 2020-01-08 | Disposition: A | Discharge status: Home / Self Care | Payer: Medicare Other | Source: Ambulatory Visit | Attending: Ophthalmology | Admitting: Ophthalmology

## 2020-01-08 DIAGNOSIS — H2511 Age-related nuclear cataract, right eye: Secondary | ICD-10-CM | POA: Diagnosis not present

## 2020-01-08 DIAGNOSIS — E119 Type 2 diabetes mellitus without complications: Secondary | ICD-10-CM | POA: Insufficient documentation

## 2020-01-08 DIAGNOSIS — I10 Essential (primary) hypertension: Secondary | ICD-10-CM | POA: Diagnosis not present

## 2020-01-08 DIAGNOSIS — H25811 Combined forms of age-related cataract, right eye: Secondary | ICD-10-CM | POA: Diagnosis not present

## 2020-01-08 DIAGNOSIS — J449 Chronic obstructive pulmonary disease, unspecified: Secondary | ICD-10-CM | POA: Diagnosis not present

## 2020-01-08 HISTORY — DX: Benign prostatic hyperplasia without lower urinary tract symptoms: N40.0

## 2020-01-08 HISTORY — DX: Dyspnea, unspecified: R06.00

## 2020-01-08 HISTORY — DX: Pure hypercholesterolemia, unspecified: E78.00

## 2020-01-08 HISTORY — PX: CATARACT EXTRACTION W/PHACO: SHX586

## 2020-01-08 HISTORY — DX: Anxiety disorder, unspecified: F41.9

## 2020-01-08 LAB — GLUCOSE, CAPILLARY
Glucose-Capillary: 196 mg/dL — ABNORMAL HIGH (ref 70–99)
Glucose-Capillary: 210 mg/dL — ABNORMAL HIGH (ref 70–99)

## 2020-01-08 SURGERY — PHACOEMULSIFICATION, CATARACT, WITH IOL INSERTION
Anesthesia: Monitor Anesthesia Care | Site: Eye | Laterality: Right

## 2020-01-08 MED ORDER — LIDOCAINE HCL (PF) 2 % IJ SOLN
INTRAOCULAR | Status: DC | PRN
  Administered 2020-01-08: 1 mL

## 2020-01-08 MED ORDER — EPINEPHRINE PF 1 MG/ML IJ SOLN
INTRAOCULAR | Status: DC | PRN
  Administered 2020-01-08: 73 mL via OPHTHALMIC

## 2020-01-08 MED ORDER — NA CHONDROIT SULF-NA HYALURON 40-17 MG/ML IO SOLN
INTRAOCULAR | Status: DC | PRN
  Administered 2020-01-08: 1 mL via INTRAOCULAR

## 2020-01-08 MED ORDER — MIDAZOLAM HCL 2 MG/2ML IJ SOLN
INTRAMUSCULAR | Status: DC | PRN
  Administered 2020-01-08: 1 mg via INTRAVENOUS

## 2020-01-08 MED ORDER — MOXIFLOXACIN HCL 0.5 % OP SOLN
OPHTHALMIC | Status: DC | PRN
  Administered 2020-01-08: 0.2 mL via OPHTHALMIC

## 2020-01-08 MED ORDER — TETRACAINE HCL 0.5 % OP SOLN
1.0000 [drp] | OPHTHALMIC | Status: DC | PRN
  Administered 2020-01-08 (×3): 1 [drp] via OPHTHALMIC

## 2020-01-08 MED ORDER — BRIMONIDINE TARTRATE-TIMOLOL 0.2-0.5 % OP SOLN
OPHTHALMIC | Status: DC | PRN
  Administered 2020-01-08: 1 [drp] via OPHTHALMIC

## 2020-01-08 MED ORDER — ARMC OPHTHALMIC DILATING DROPS
1.0000 "application " | OPHTHALMIC | Status: DC | PRN
  Administered 2020-01-08 (×3): 1 via OPHTHALMIC

## 2020-01-08 MED ORDER — FENTANYL CITRATE (PF) 100 MCG/2ML IJ SOLN
INTRAMUSCULAR | Status: DC | PRN
  Administered 2020-01-08: 50 ug via INTRAVENOUS

## 2020-01-08 SURGICAL SUPPLY — 21 items
CANNULA ANT/CHMB 27G (MISCELLANEOUS) ×2 IMPLANT
CANNULA ANT/CHMB 27GA (MISCELLANEOUS) ×4 IMPLANT
DISSECTOR HYDRO NUCLEUS 50X22 (MISCELLANEOUS) ×1 IMPLANT
GLOVE SURG LX 8.0 MICRO (GLOVE) ×1
GLOVE SURG LX STRL 8.0 MICRO (GLOVE) ×1 IMPLANT
GLOVE SURG TRIUMPH 8.0 PF LTX (GLOVE) ×2 IMPLANT
GOWN STRL REUS W/ TWL LRG LVL3 (GOWN DISPOSABLE) ×2 IMPLANT
GOWN STRL REUS W/TWL LRG LVL3 (GOWN DISPOSABLE) ×4
LENS IOL DIOP 20.0 (Intraocular Lens) ×2 IMPLANT
LENS IOL TECNIS MONO 20.0 (Intraocular Lens) IMPLANT
MARKER SKIN DUAL TIP RULER LAB (MISCELLANEOUS) ×2 IMPLANT
NDL FILTER BLUNT 18X1 1/2 (NEEDLE) ×1 IMPLANT
NEEDLE FILTER BLUNT 18X 1/2SAF (NEEDLE) ×1
NEEDLE FILTER BLUNT 18X1 1/2 (NEEDLE) ×1 IMPLANT
PACK EYE AFTER SURG (MISCELLANEOUS) ×2 IMPLANT
PACK OPTHALMIC (MISCELLANEOUS) ×2 IMPLANT
PACK PORFILIO (MISCELLANEOUS) ×2 IMPLANT
SYR 3ML LL SCALE MARK (SYRINGE) ×2 IMPLANT
SYR TB 1ML LUER SLIP (SYRINGE) ×2 IMPLANT
WATER STERILE IRR 250ML POUR (IV SOLUTION) ×2 IMPLANT
WIPE NON LINTING 3.25X3.25 (MISCELLANEOUS) ×2 IMPLANT

## 2020-01-08 NOTE — H&P (Signed)
All labs reviewed. Abnormal studies sent to patients PCP when indicated.  Previous H&P reviewed, patient examined, there are NO CHANGES.  Zachary Mcdaniel Porfilio3/23/20219:15 AM

## 2020-01-08 NOTE — Transfer of Care (Signed)
Immediate Anesthesia Transfer of Care Note  Patient: Zachary Santos  Procedure(s) Performed: CATARACT EXTRACTION PHACO AND INTRAOCULAR LENS PLACEMENT (IOC) RIGHT DIABETIC 5.75  00:49.3 (Right Eye)  Patient Location: PACU  Anesthesia Type: MAC  Level of Consciousness: awake, alert  and patient cooperative  Airway and Oxygen Therapy: Patient Spontanous Breathing and Patient connected to supplemental oxygen  Post-op Assessment: Post-op Vital signs reviewed, Patient's Cardiovascular Status Stable, Respiratory Function Stable, Patent Airway and No signs of Nausea or vomiting  Post-op Vital Signs: Reviewed and stable  Complications: No apparent anesthesia complications

## 2020-01-08 NOTE — Op Note (Signed)
PREOPERATIVE DIAGNOSIS:  Nuclear sclerotic cataract of the right eye.   POSTOPERATIVE DIAGNOSIS:  H25.11 Cataract   OPERATIVE PROCEDURE:@   SURGEON:  Birder Robson, MD.   ANESTHESIA:  Anesthesiologist: Page, Adele Barthel, MD CRNA: Silvana Newness, CRNA  1.      Managed anesthesia care. 2.      0.46ml of Shugarcaine was instilled in the eye following the paracentesis.   COMPLICATIONS:  None.   TECHNIQUE:   Stop and chop   DESCRIPTION OF PROCEDURE:  The patient was examined and consented in the preoperative holding area where the aforementioned topical anesthesia was applied to the right eye and then brought back to the Operating Room where the right eye was prepped and draped in the usual sterile ophthalmic fashion and a lid speculum was placed. A paracentesis was created with the side port blade and the anterior chamber was filled with viscoelastic. A near clear corneal incision was performed with the steel keratome. A continuous curvilinear capsulorrhexis was performed with a cystotome followed by the capsulorrhexis forceps. Hydrodissection and hydrodelineation were carried out with BSS on a blunt cannula. The lens was removed in a stop and chop  technique and the remaining cortical material was removed with the irrigation-aspiration handpiece. The capsular bag was inflated with viscoelastic and the Technis [id number]  lens was placed in the capsular bag without complication. The remaining viscoelastic was removed from the eye with the irrigation-aspiration handpiece. The wounds were hydrated. The anterior chamber was flushed with BSS and the eye was inflated to physiologic pressure. 0.68ml of Vigamox was placed in the anterior chamber. The wounds were found to be water tight. The eye was dressed with Combigan. The patient was given protective glasses to wear throughout the day and a shield with which to sleep tonight. The patient was also given drops with which to begin a drop regimen today and will  follow-up with me in one day. Implant Name Type Inv. Item Serial No. Manufacturer Lot No. LRB No. Used Action  LENS IOL DIOP 20.0 - [id number] Intraocular Lens LENS IOL DIOP 20.0 [id number]  Right 1 Implanted   Procedure(s) with comments: CATARACT EXTRACTION PHACO AND INTRAOCULAR LENS PLACEMENT (IOC) RIGHT DIABETIC 5.75  00:49.3 (Right) - Diabetic  Electronically signed: Birder Robson 01/08/2020 9:45 AM

## 2020-01-08 NOTE — Anesthesia Postprocedure Evaluation (Signed)
Anesthesia Post Note  Patient: Zachary Santos  Procedure(s) Performed: CATARACT EXTRACTION PHACO AND INTRAOCULAR LENS PLACEMENT (IOC) RIGHT DIABETIC 5.75  00:49.3 (Right Eye)     Patient location during evaluation: PACU Anesthesia Type: MAC Level of consciousness: awake and alert Pain management: pain level controlled Vital Signs Assessment: post-procedure vital signs reviewed and stable Respiratory status: spontaneous breathing Cardiovascular status: blood pressure returned to baseline Postop Assessment: no apparent nausea or vomiting, adequate PO intake and no headache Anesthetic complications: no    Adele Barthel Oluwaseun Cremer

## 2020-01-08 NOTE — Anesthesia Preprocedure Evaluation (Signed)
Anesthesia Evaluation  Patient identified by MRN, date of birth, ID band  History of Anesthesia Complications Negative for: history of anesthetic complications  Airway Mallampati: III  TM Distance: >3 FB Neck ROM: Full    Dental  (+) Partial Upper   Pulmonary COPD, Current SmokerPatient did not abstain from smoking.,    Pulmonary exam normal        Cardiovascular hypertension, Normal cardiovascular exam     Neuro/Psych negative neurological ROS     GI/Hepatic negative GI ROS, Neg liver ROS,   Endo/Other  diabetes, Type 2  Renal/GU negative Renal ROS     Musculoskeletal   Abdominal   Peds  Hematology negative hematology ROS (+)   Anesthesia Other Findings   Reproductive/Obstetrics                             Anesthesia Physical Anesthesia Plan  ASA: III  Anesthesia Plan: MAC   Post-op Pain Management:    Induction:   PONV Risk Score and Plan: 0 and Treatment may vary due to age or medical condition  Airway Management Planned: Nasal Cannula and Natural Airway  Additional Equipment: None  Intra-op Plan:   Post-operative Plan:   Informed Consent: I have reviewed the patients History and Physical, chart, labs and discussed the procedure including the risks, benefits and alternatives for the proposed anesthesia with the patient or authorized representative who has indicated his/her understanding and acceptance.       Plan Discussed with: CRNA  Anesthesia Plan Comments:         Anesthesia Quick Evaluation

## 2020-01-09 ENCOUNTER — Encounter: Payer: Self-pay | Admitting: *Deleted

## 2020-01-10 ENCOUNTER — Other Ambulatory Visit: Payer: Self-pay

## 2020-01-10 NOTE — Patient Outreach (Signed)
[name]) Care Management  01/10/2020  Zachary Santos 1936-05-03 [id number]   Medication Adherence call to Mr. Zachary Santos Hippa Identifiers Verify spoke with patient he is past due on Tradjenta 5 mg,patient explain he takes 1 tablet daily,spoke with patient wife she explain patient copay has increase,patient has an up coming [place] said they will ask doctor on this medication and will call back. [name] is showing past due under [place].   [facility] Management Direct Dial [phone]  Fax [phone] [email or web address]

## 2020-01-23 ENCOUNTER — Telehealth: Payer: Self-pay | Admitting: Family Medicine

## 2020-01-23 NOTE — Telephone Encounter (Signed)
Left message with pt spouse to have pt call back and schedule Medicare Annual Wellness Visit (AWV) either virtually or audio only.  No hx of AWV; please schedule at anytime with Denisa O'Brien-Blaney at [facility]

## 2020-01-24 ENCOUNTER — Ambulatory Visit (INDEPENDENT_AMBULATORY_CARE_PROVIDER_SITE_OTHER): Payer: Medicare Other

## 2020-01-24 VITALS — Ht 72.0 in | Wt 183.0 lb

## 2020-01-24 DIAGNOSIS — Z0000 Encounter for general adult medical examination without abnormal findings: Secondary | ICD-10-CM

## 2020-01-24 NOTE — Patient Instructions (Addendum)
  Mr. Dun , Thank you for taking time to come for your Medicare Wellness Visit. I appreciate your ongoing commitment to your health goals. Please review the following plan we discussed and let me know if I can assist you in the future.   These are the goals we discussed: Goals      Patient Stated   . 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 Complete foot exam   11/08/2018  . Hemoglobin A1C  10/15/2019  . Flu Shot  05/18/2020  . Lipid (cholesterol) test  07/15/2020  . Eye exam for diabetics  01/07/2021  . Tetanus Vaccine  06/27/2021  . Pneumonia vaccines  Addressed

## 2020-01-24 NOTE — Progress Notes (Signed)
I have reviewed the above note and agree. I would advise against the foot detox as there is not any evidence that they work or are safe.   [name], M.D.

## 2020-01-24 NOTE — Progress Notes (Addendum)
Subjective:   EDHER GING is a 84 y.o. male who presents for an Initial Medicare Annual Wellness Visit.  Review of Systems  No ROS.  Medicare Wellness Virtual Visit.  Visual/audio telehealth visit, UTA vital signs.   Ht/Wt provided.  See social history for additional risk factors.  Cardiac Risk Factors include: advanced age (>69men, >40 women);hypertension;diabetes mellitus    Objective:    Today's Vitals   01/24/20 1233  Weight: 183 lb (83 kg)  Height: 6' (1.829 m)   Body mass index is 24.82 kg/m.  Advanced Directives 01/24/2020 01/08/2020 12/11/2019 07/21/2016 03/23/2015 03/23/2015 04/18/2014  Does Patient Have a Medical Advance Directive? No No No No No No Patient does not have advance directive;Patient would like information  Would patient like information on creating a medical advance directive? No - Patient declined No - Patient declined No - Patient declined No - patient declined information - - -    Current Medications (verified) Outpatient Encounter Medications as of 01/24/2020  Medication Sig  . amLODipine (NORVASC) 5 MG tablet Take 1 tablet (5 mg total) by mouth daily.  [name] aspirin 81 MG EC tablet Take 1 tablet (81 mg total) by mouth daily.  [name] escitalopram (LEXAPRO) 5 MG tablet Take 1 tablet (5 mg total) by mouth daily.  [name] glipiZIDE (GLUCOTROL XL) 2.5 MG 24 hr tablet TAKE ONE TABLET BY MOUTH ONCE DAILY WITH BREAKFAST  . latanoprost (XALATAN) 0.005 % ophthalmic solution INSTILL 1 DROP INTO EACH EYE AT BEDTIME  . lisinopril (ZESTRIL) 20 MG tablet Take 1 tablet by mouth once daily  . metFORMIN (GLUCOPHAGE) 500 MG tablet Take 1 tablet (500 mg total) by mouth 2 (two) times daily with a meal.  . nicotine (NICODERM CQ - DOSED IN MG/24 HOURS) 21 mg/24hr patch Place 1 patch (21 mg total) onto the skin daily. Daily for 6 weeks, then call for refill (Patient not taking: Reported on 12/03/2019)  . ONE TOUCH ULTRA TEST test strip USE TEST STRIP TO CHECK GLUCOSE AT LEAST ONCE DAILY  .  rosuvastatin (CRESTOR) 20 MG tablet Take 1 tablet (20 mg total) by mouth daily.  . tamsulosin (FLOMAX) 0.4 MG CAPS capsule Take 2 capsules by mouth once daily  . TRADJENTA 5 MG TABS tablet Take 1 tablet by mouth once daily  . traZODone (DESYREL) 50 MG tablet Take 0.5-1 tablets (25-50 mg total) by mouth at bedtime as needed for sleep.  [name] triamcinolone ointment (KENALOG) 0.1 % APPLY  OINTMENT TOPICALLY TWICE DAILY   No facility-administered encounter medications on file as of 01/24/2020.    Allergies (verified) Patient has no known allergies.   History: Past Medical History:  Diagnosis Date  . Anxiety   . BPH (benign prostatic hyperplasia)   . Chronic fatigue   . CKD (chronic kidney disease), stage III   . Claudication [facility])    a. R Hip.  [name] COPD (chronic obstructive pulmonary disease) ([place])   . Dyspnea    with exertion  . Essential hypertension    a. 01/2008 Echo: EF 65%, no rwma, mod increased PASP.  [name] Hypercholesterolemia   . Osteoarthritis   . Paget's disease   . Syncope    a. 03/2015 in setting of hypotension.  . Tobacco abuse    a. 1-1.5 ppd x 60+ yrs.  . Type II diabetes mellitus ([place])   . Wears partial dentures    uppers   Past Surgical History:  Procedure Laterality Date  . APPENDECTOMY    . CATARACT EXTRACTION  W/PHACO Left 12/11/2019   Procedure: CATARACT EXTRACTION PHACO AND INTRAOCULAR LENS PLACEMENT ([place]) LEFT DIABETIC MALYUGIN;  Surgeon: Birder Robson, MD;  Location: [facility];  Service: Ophthalmology;  Laterality: Left;  CDE 11.45 U/S  1:05.7  . CATARACT EXTRACTION W/PHACO Right 01/08/2020   Procedure: CATARACT EXTRACTION PHACO AND INTRAOCULAR LENS PLACEMENT (IOC) RIGHT DIABETIC 5.75  00:49.3;  Surgeon: Birder Robson, MD;  Location: [facility];  Service: Ophthalmology;  Laterality: Right;  Diabetic  . laparscopic chole    . SPINE SURGERY     cervical spine surgery  . trigeminal neuralgia     with surgery in [place]   Family  History  Problem Relation Age of Onset  . Cancer Mother        died @ 42.  [name] COPD Father        died @ 73.  . Diabetes Brother        died in early 54's.   Social History   Socioeconomic History  . Marital status: Married    Spouse name: Not on file  . Number of children: Not on file  . Years of education: Not on file  . Highest education level: Not on file  Occupational History  . Not on file  Tobacco Use  . Smoking status: Current Every Day Smoker    Packs/day: 1.50    Years: 60.00    Pack years: 90.00    Types: Cigarettes  . Smokeless tobacco: Current User  Substance and Sexual Activity  . Alcohol use: Yes    Alcohol/week: 0.0 standard drinks    Comment: rare - "1% of the time."  . Drug use: No  . Sexual activity: Not on file  Other Topics Concern  . Not on file  Social History Narrative   Lives in [place] with wife.  Retired Administrator.  Very sedentary.  Avoids activity b/c he "gives out" and is limited by right hip/buttock discomfort/claudication.   Social Determinants of Health   Financial Resource Strain: Low Risk   . Difficulty of Paying Living Expenses: Not hard at all  Food Insecurity: No Food Insecurity  . Worried About Charity fundraiser in the Last Year: Never true  . Ran Out of Food in the Last Year: Never true  Transportation Needs: No Transportation Needs  . Lack of Transportation (Medical): No  . Lack of Transportation (Non-Medical): No  Physical Activity:   . Days of Exercise per Week:   . Minutes of Exercise per Session:   Stress: No Stress Concern Present  . Feeling of Stress : Only a little  Social Connections: Unknown  . Frequency of Communication with Friends and Family: More than three times a week  . Frequency of Social Gatherings with Friends and Family: Once a week  . Attends Religious Services: Not on file  . Active Member of Clubs or Organizations: Not on file  . Attends Archivist Meetings: Not on file  . Marital  Status: Married   Tobacco Counseling Ready to quit: Not Answered Counseling given: Not Answered   Clinical Intake:  Pre-visit preparation completed: Yes        Diabetes: Yes(Followed by pcp)  How often do you need to have someone help you when you read instructions, pamphlets, or other written materials from your doctor or pharmacy?: 1 - Never  Interpreter Needed?: No     Activities of Daily Living In your present state of health, do you have any difficulty performing the  following activities: 01/24/2020 01/08/2020  Hearing? N N  Vision? N N  Difficulty concentrating or making decisions? N N  Walking or climbing stairs? N N  Dressing or bathing? N N  Doing errands, shopping? N -  Preparing Food and eating ? N -  Using the Toilet? N -  In the past six months, have you accidently leaked urine? N -  Some recent data might be hidden     Immunizations and Health Maintenance Immunization History  Administered Date(s) Administered  . Fluad Quad(high Dose 65+) 07/16/2019  . Influenza Split 07/20/2012  . Influenza Whole 06/28/2011  . Influenza, High Dose Seasonal PF 07/28/2016, 08/29/2018  . Influenza,inj,Quad PF,6+ Mos 09/06/2013, 07/28/2015  . Pneumococcal Polysaccharide-23 06/05/2006  . Td 06/28/2011   Health Maintenance Due  Topic Date Due  . FOOT EXAM  11/08/2018  . HEMOGLOBIN A1C  10/15/2019    Patient Care Team: Leone Haven, MD as PCP - General (Family Medicine)  Indicate any recent Medical Services you may have received from other than [facility] providers in the past year (date may be approximate).    Assessment:   This is a routine wellness examination for Regino.  Nurse connected with patient 01/24/20 at 12:30 PM EDT by a telephone enabled telemedicine application and verified that I am speaking with the correct person using two identifiers. Patient stated full name and DOB. Patient gave permission to continue with virtual visit. Patient's location was at  home and Nurse's location was at [place] office.   Patient is alert and oriented x3. Patient denies difficulty focusing or concentrating.  Health Maintenance Due: -Foot Exam- denies changes with feet, no wounds, numbness, tingling. Does not walk barefoot outside. Considering foot detox process.  -Hgb A1c- 07/16/19 (8.5)  See completed HM at the end of note.   Eye: Visual acuity not assessed. Virtual visit. Followed by their ophthalmologist.  Retinopathy- none reported. L eye cataract extraction.   Dental: Dentures- partial  Hearing: Demonstrates normal hearing during visit.  Safety:  Patient feels safe at home- yes Patient does have smoke detectors at home- yes Patient does wear sunscreen or protective clothing when in direct sunlight - yes Patient does wear seat belt when in a moving vehicle - yes Patient drives- yes Adequate lighting in walkways free from debris- yes Grab bars and handrails used as appropriate- yes Ambulates with an assistive device- no Cell phone on person when ambulating outside of the home- yes  Social: Alcohol intake - yes, occ beer Smoking history- current Illicit drug use? none  Medication: Taking as directed and without issues.  Pill box in use -yes  Self managed - yes   Sleeping well  Covid-19: Precautions and sickness symptoms discussed. Wears mask, social distancing, hand hygiene as appropriate.   Activities of Daily Living Patient denies needing assistance with: household chores, feeding themselves, getting from bed to chair, getting to the toilet, bathing/showering, dressing, managing money, or preparing meals.   Discussed the importance of a healthy diet, water intake and the benefits of aerobic exercise.   Physical activity- active at home, no routine.   Diet:  Regular; tries to watch sodium intake Water: fair intake; encouraged to stay hydrated Caffeine: 3 cup of coffee  Other Providers Patient Care Team: Leone Haven,  MD as PCP - General (Family Medicine)  Hearing/Vision screen  Hearing Screening   125Hz  250Hz  500Hz  1000Hz  2000Hz  3000Hz  4000Hz  6000Hz  8000Hz   Right ear:  Left ear:           Comments: Patient is able to hear conversational tones without difficulty.  No issues reported.  Vision Screening Comments: Followed by Dr. George Ina, [facility] Wears corrective lenses Cataract extraction, L eye Visual acuity not assessed, virtual visit.  They have seen their ophthalmologist in the last 12 months.     Dietary issues and exercise activities discussed: Current Exercise Habits: Home exercise routine  Goals      Patient Stated   . I need to do more for activity (pt-stated)     Balancing exercises Start exercise classes at [facility] Work in the shop      Depression Screen PHQ 2/9 Scores 01/24/2020 08/22/2019 07/17/2019 07/16/2019  PHQ - 2 Score 0 0 4 0  PHQ- 9 Score - - 15 -    Fall Risk Fall Risk  01/24/2020 11/23/2019 08/22/2019 07/16/2019 11/08/2017  Falls in the past year? 0 0 0 0 No  Number falls in past yr: - 0 0 0 -  Injury with Fall? - 0 - - -  Follow up Falls evaluation completed Falls evaluation completed Falls evaluation completed Falls evaluation completed -   Timed Get Up and Go performed: no, virtual visit  Cognitive Function:     6CIT Screen 01/24/2020  What Year? 0 points  What month? 0 points  What time? 0 points  Count back from 20 0 points  Months in reverse 0 points  Repeat phrase 0 points  Total Score 0    Screening Tests Health Maintenance  Topic Date Due  . FOOT EXAM  11/08/2018  . HEMOGLOBIN A1C  10/15/2019  . INFLUENZA VACCINE  05/18/2020  . LIPID PANEL  07/15/2020  . OPHTHALMOLOGY EXAM  01/07/2021  . TETANUS/TDAP  06/27/2021  . PNA vac Low Risk Adult  Addressed      Plan:   Keep all routine maintenance appointments.   Patient advised per note, PMD does not agree with foot detox as there is no evidence that they work or are safe. Patient  verbalizes understanding.   Medicare Attestation I have personally reviewed: The patient's medical and social history Their use of alcohol, tobacco or illicit drugs Their current medications and supplements The patient's functional ability including ADLs,fall risks, home safety risks, cognitive, and hearing and visual impairment Diet and physical activities Evidence for depression   I have reviewed and discussed with patient certain preventive protocols, quality metrics, and best practice recommendations.     Varney Biles, LPN   [id number]

## 2020-03-05 DIAGNOSIS — Z961 Presence of intraocular lens: Secondary | ICD-10-CM | POA: Diagnosis not present

## 2020-03-13 DIAGNOSIS — Z7984 Long term (current) use of oral hypoglycemic drugs: Secondary | ICD-10-CM | POA: Diagnosis not present

## 2020-03-13 DIAGNOSIS — E119 Type 2 diabetes mellitus without complications: Secondary | ICD-10-CM | POA: Diagnosis not present

## 2020-03-13 DIAGNOSIS — H401231 Low-tension glaucoma, bilateral, mild stage: Secondary | ICD-10-CM | POA: Diagnosis not present

## 2020-03-13 DIAGNOSIS — Z961 Presence of intraocular lens: Secondary | ICD-10-CM | POA: Diagnosis not present

## 2020-03-14 ENCOUNTER — Other Ambulatory Visit: Payer: Self-pay | Admitting: Family Medicine

## 2020-05-16 ENCOUNTER — Emergency Department
Admission: EM | Admit: 2020-05-16 | Discharge: 2020-05-16 | Disposition: A | Discharge status: Home / Self Care | Payer: Medicare Other | Attending: Emergency Medicine | Admitting: Emergency Medicine

## 2020-05-16 ENCOUNTER — Encounter: Payer: Self-pay | Admitting: Emergency Medicine

## 2020-05-16 ENCOUNTER — Other Ambulatory Visit: Payer: Self-pay

## 2020-05-16 DIAGNOSIS — Z5321 Procedure and treatment not carried out due to patient leaving prior to being seen by health care provider: Secondary | ICD-10-CM | POA: Diagnosis not present

## 2020-05-16 DIAGNOSIS — I4891 Unspecified atrial fibrillation: Secondary | ICD-10-CM | POA: Insufficient documentation

## 2020-05-16 DIAGNOSIS — R531 Weakness: Secondary | ICD-10-CM | POA: Diagnosis not present

## 2020-05-16 DIAGNOSIS — Z743 Need for continuous supervision: Secondary | ICD-10-CM | POA: Diagnosis not present

## 2020-05-16 DIAGNOSIS — R6889 Other general symptoms and signs: Secondary | ICD-10-CM | POA: Diagnosis not present

## 2020-05-16 LAB — CBC
HCT: 41.6 % (ref 39.0–52.0)
Hemoglobin: 14.7 g/dL (ref 13.0–17.0)
MCH: 31.3 pg (ref 26.0–34.0)
MCHC: 35.3 g/dL (ref 30.0–36.0)
MCV: 88.7 fL (ref 80.0–100.0)
Platelets: 182 10*3/uL (ref 150–400)
RBC: 4.69 MIL/uL (ref 4.22–5.81)
RDW: 12.9 % (ref 11.5–15.5)
WBC: 7.9 10*3/uL (ref 4.0–10.5)
nRBC: 0 % (ref 0.0–0.2)

## 2020-05-16 LAB — BASIC METABOLIC PANEL
Anion gap: 9 (ref 5–15)
BUN: 14 mg/dL (ref 8–23)
CO2: 24 mmol/L (ref 22–32)
Calcium: 9.1 mg/dL (ref 8.9–10.3)
Chloride: 103 mmol/L (ref 98–111)
Creatinine, Ser: 1.46 mg/dL — ABNORMAL HIGH (ref 0.61–1.24)
GFR calc Af Amer: 52 mL/min — ABNORMAL LOW (ref >60)
GFR calc non Af Amer: 44 mL/min — ABNORMAL LOW (ref >60)
Glucose, Bld: 313 mg/dL — ABNORMAL HIGH (ref 70–99)
Potassium: 3.9 mmol/L (ref 3.5–5.1)
Sodium: 136 mmol/L (ref 135–145)

## 2020-05-16 MED ORDER — SODIUM CHLORIDE 0.9% FLUSH: 3.0000 mL | Freq: Once | INTRAVENOUS | Status: DC

## 2020-05-16 NOTE — ED Triage Notes (Signed)
Pt to ED via [facility] from home for generalized weakness. Pt states that he felt like he was going to pass out. Per EMS pts 12 lead, showed a fib RVR with a rate in the 140s, our EGK showed A fib with rate of 93.Pt states that he is feeling some better now. Pt denies any other symptoms at this time.

## 2020-05-16 NOTE — ED Notes (Signed)
Cannot find pt. 

## 2020-05-16 NOTE — ED Triage Notes (Signed)
First nurse note- pt arrived EMS for afib RVR in 140s.  Per EMS diaphoretic, CBG 238. Pulled next for triage.

## 2020-05-16 NOTE — ED Notes (Signed)
Pt seen ambulating to restroom. Offered to push in wheelchair to larger bathroom, pt declined.

## 2020-05-16 NOTE — ED Notes (Signed)
Called for repeat vitals, cannot find pt.

## 2020-05-16 NOTE — ED Notes (Signed)
No answer, not outside.

## 2020-05-23 ENCOUNTER — Ambulatory Visit: Payer: Medicare Other | Admitting: Podiatry

## 2020-05-23 ENCOUNTER — Ambulatory Visit (INDEPENDENT_AMBULATORY_CARE_PROVIDER_SITE_OTHER): Payer: Medicare Other

## 2020-05-23 ENCOUNTER — Other Ambulatory Visit: Payer: Self-pay

## 2020-05-23 DIAGNOSIS — I739 Peripheral vascular disease, unspecified: Secondary | ICD-10-CM | POA: Diagnosis not present

## 2020-05-23 DIAGNOSIS — I998 Other disorder of circulatory system: Secondary | ICD-10-CM | POA: Diagnosis not present

## 2020-05-23 DIAGNOSIS — I70229 Atherosclerosis of native arteries of extremities with rest pain, unspecified extremity: Secondary | ICD-10-CM

## 2020-05-23 NOTE — Progress Notes (Signed)
   HPI: 84 y.o. male PMHx diabetes mellitus type 2 presenting today for evaluation of right forefoot pain.  Patient states of the past week he has developed severe right forefoot pain.  He states that his toes feel like they are burning.  Very tender to touch.  He denies trauma to the area.  He presents for further treatment evaluation  Past Medical History:  Diagnosis Date  . Anxiety   . BPH (benign prostatic hyperplasia)   . Chronic fatigue   . CKD (chronic kidney disease), stage III   . Claudication [facility])    a. R Hip.  [name] COPD (chronic obstructive pulmonary disease) ([place])   . Dyspnea    with exertion  . Essential hypertension    a. 01/2008 Echo: EF 65%, no rwma, mod increased PASP.  [name] Hypercholesterolemia   . Osteoarthritis   . Paget's disease   . Syncope    a. 03/2015 in setting of hypotension.  . Tobacco abuse    a. 1-1.5 ppd x 60+ yrs.  . Type II diabetes mellitus ([place])   . Wears partial dentures    uppers       Physical Exam: General: The patient is alert and oriented x3 in no acute distress.  Dermatology: Skin is warm, dry and supple bilateral lower extremities. Negative for open lesions or macerations.  Vascular: Blue discoloration of the toes 2-5 right foot consistent with foot ischemia.  There is associated hypersensitivity and pain with light touch.  The toes are cold to touch.  Unable to palpate pedal pulses right lower extremity  Neurological: Epicritic and protective threshold grossly intact bilaterally.   Musculoskeletal Exam: Significant pain on palpation even with light touch to the toes 2-5.  Patient unable to bear weight on the forefoot.  Radiographic Exam:  Normal osseous mineralization. Joint spaces preserved. No fracture/dislocation/boney destruction.    Assessment: 1.  Critical limb ischemia right lower extremity 2.  Ischemic digits 2-5 right foot  Plan of Care:  1. Patient evaluated. X-Rays reviewed.  I explained to the patient this is strictly a  circulatory issue.  He is not getting blood flow to his toes.  Patient understands. 2.  Order placed for stat arterial Dopplers with ABI 3.  Referral placed for vascular consult and work-up 4.  Prescription for Vicodin 5/325 mg as needed pain  5.  Return to clinic as needed     Edrick Kins, DPM [facility]  Dr. Edrick Kins, DPM    [address], [place] [address]                Office [phone]  Fax [phone]

## 2020-05-26 ENCOUNTER — Telehealth: Payer: Self-pay | Admitting: Family Medicine

## 2020-05-26 NOTE — Telephone Encounter (Signed)
Patient was in the ED a couple weeks ago though left without being seen. He needs an office visit this week if possible for follow-up on this. Can you call him and offer him an appointment? Thanks.

## 2020-05-26 NOTE — Telephone Encounter (Signed)
Spoke with patient and he was able to schedule an appt to come in tomorrow at 12:45 pm; okay per Dr. Caryl Bis

## 2020-05-27 ENCOUNTER — Ambulatory Visit (INDEPENDENT_AMBULATORY_CARE_PROVIDER_SITE_OTHER): Payer: Medicare Other | Admitting: Family Medicine

## 2020-05-27 ENCOUNTER — Other Ambulatory Visit: Payer: Self-pay

## 2020-05-27 ENCOUNTER — Encounter: Payer: Self-pay | Admitting: Family Medicine

## 2020-05-27 VITALS — BP 117/70 | HR 67 | Temp 97.5°F | Ht 72.0 in | Wt 179.0 lb

## 2020-05-27 DIAGNOSIS — R230 Cyanosis: Secondary | ICD-10-CM

## 2020-05-27 DIAGNOSIS — E785 Hyperlipidemia, unspecified: Secondary | ICD-10-CM | POA: Diagnosis not present

## 2020-05-27 DIAGNOSIS — E1159 Type 2 diabetes mellitus with other circulatory complications: Secondary | ICD-10-CM | POA: Diagnosis not present

## 2020-05-27 DIAGNOSIS — I482 Chronic atrial fibrillation, unspecified: Secondary | ICD-10-CM | POA: Insufficient documentation

## 2020-05-27 DIAGNOSIS — R0989 Other specified symptoms and signs involving the circulatory and respiratory systems: Secondary | ICD-10-CM | POA: Diagnosis not present

## 2020-05-27 DIAGNOSIS — I480 Paroxysmal atrial fibrillation: Secondary | ICD-10-CM

## 2020-05-27 LAB — LIPID PANEL
Cholesterol: 131 mg/dL (ref 0–200)
HDL: 24.6 mg/dL — ABNORMAL LOW (ref >39.00)
LDL Cholesterol: 76 mg/dL (ref 0–99)
NonHDL: 106.44
Total CHOL/HDL Ratio: 5
Triglycerides: 153 mg/dL — ABNORMAL HIGH (ref 0.0–149.0)
VLDL: 30.6 mg/dL (ref 0.0–40.0)

## 2020-05-27 LAB — COMPREHENSIVE METABOLIC PANEL
ALT: 16 U/L (ref 0–53)
AST: 14 U/L (ref 0–37)
Albumin: 4.2 g/dL (ref 3.5–5.2)
Alkaline Phosphatase: 193 U/L — ABNORMAL HIGH (ref 39–117)
BUN: 17 mg/dL (ref 6–23)
CO2: 26 mEq/L (ref 19–32)
Calcium: 9.4 mg/dL (ref 8.4–10.5)
Chloride: 104 mEq/L (ref 96–112)
Creatinine, Ser: 1.41 mg/dL (ref 0.40–1.50)
GFR: 48.13 mL/min — ABNORMAL LOW (ref >60.00)
Glucose, Bld: 193 mg/dL — ABNORMAL HIGH (ref 70–99)
Potassium: 4.3 mEq/L (ref 3.5–5.1)
Sodium: 138 mEq/L (ref 135–145)
Total Bilirubin: 0.5 mg/dL (ref 0.2–1.2)
Total Protein: 6.6 g/dL (ref 6.0–8.3)

## 2020-05-27 LAB — HEMOGLOBIN A1C: Hgb A1c MFr Bld: 9.6 % — ABNORMAL HIGH (ref 4.6–6.5)

## 2020-05-27 MED ORDER — APIXABAN 5 MG PO TABS: 5.0000 mg | ORAL_TABLET | Freq: Two times a day (BID) | ORAL | 2 refills | Status: DC

## 2020-05-27 NOTE — Assessment & Plan Note (Signed)
Patient appears to have paroxysmal atrial fibrillation.  He is in sinus rhythm today.  Discussed risk of stroke being elevated and recommended Eliquis as a blood thinner.  Patient will contact us if this is too expensive.  Discussed risk of bleeding with this.  He has been rate controlled even when he was in A. fib and given his potential PAD in his right leg I am a little hesitant to place him on a beta-blocker at this time.  We will refer him to cardiology for further evaluation to determine appropriateness of beta-blocker therapy versus other rate control or rhythm control therapies.  Advised to seek medical attention for palpitations, stroke symptoms, or any cardiac symptoms.  Discussed bleeding precautions and seeking medical attention if he were to develop uncontrolled bleeding.

## 2020-05-27 NOTE — Assessment & Plan Note (Signed)
Check lipid panel.  Continue Crestor.

## 2020-05-27 NOTE — Assessment & Plan Note (Signed)
Concern for PAD and ischemia.  He notes no claudication symptoms though.  He does have palpable PT pulses today.  Will refer urgently to vascular surgery for further evaluation.

## 2020-05-27 NOTE — Assessment & Plan Note (Signed)
Likely uncontrolled.  Check A1c.  Continue current regimen.

## 2020-05-27 NOTE — Patient Instructions (Signed)
Presents to see you. I am referring you to vascular surgery and cardiology.  If you do not hear from them this week please let us know. I have started you on Eliquis.  If this is too expensive please let us know.  If you develop bleeding issues that she cannot get stopped or excessive bleeding please go to the emergency room.  If you ever develop signs of stroke or uncontrolled heart rate please go to the emergency room as well. We will contact you with your lab results.

## 2020-05-27 NOTE — Progress Notes (Signed)
[name], MD Phone: [phone]  Zachary Santos is a 84 y.o. male who presents today for f/u.  Atrial fibrillation: Patient went to the ED related to dizziness that made him feel presyncopal.  No syncopal episodes.  He had an EKG revealing atrial fibrillation.  He left before being seen.  He has noted no palpitations or chest pain.  Notes his heart rate at home is typically in the 60s and 70s.  CHA2DS2-VASc score is 5.  Has bled score is 2.  Diabetes: Not checking sugars.  Was 313 in the hospital.  No polyuria or polydipsia.  He is on Tradjenta, glipizide, and Metformin.  PAD: Patient saw podiatry recently for foot pain.  They felt as though he had limb ischemia due to his toes being blue and lack of pedal pulses.  They appear less blue today.  He notes no claudication.  He missed the imaging that was ordered through podiatry.  Social History   Tobacco Use  Smoking Status Current Every Day Smoker  . Packs/day: 1.50  . Years: 60.00  . Pack years: 90.00  . Types: Cigarettes  Smokeless Tobacco Current User     ROS see history of present illness  Objective  Physical Exam Vitals:   05/27/20 1300  BP: 117/70  Pulse: 67  Temp: (!) 97.5 F (36.4 C)  SpO2: 98%    BP Readings from Last 3 Encounters:  05/27/20 117/70  05/16/20 114/66  01/08/20 (!) 128/59   Wt Readings from Last 3 Encounters:  05/27/20 179 lb (81.2 kg)  05/16/20 175 lb (79.4 kg)  01/24/20 183 lb (83 kg)    Physical Exam Constitutional:      General: He is not in acute distress.    Appearance: He is not diaphoretic.  Cardiovascular:     Rate and Rhythm: Normal rate and regular rhythm.     Heart sounds: Normal heart sounds.     Comments: 2+ PT pulses bilaterally, absent DP pulses bilaterally, tips of toes 2 through 5 on the right foot have slight blue tinge that appears improved compared to the picture in the podiatrist note Pulmonary:     Effort: Pulmonary effort is normal.     Breath sounds:  Normal breath sounds.  Skin:    General: Skin is warm and dry.  Neurological:     Mental Status: He is alert.      Assessment/Plan: Please see individual problem list.  A-fib [facility]) Patient appears to have paroxysmal atrial fibrillation.  He is in sinus rhythm today.  Discussed risk of stroke being elevated and recommended Eliquis as a blood thinner.  Patient will contact us if this is too expensive.  Discussed risk of bleeding with this.  He has been rate controlled even when he was in A. fib and given his potential PAD in his right leg I am a little hesitant to place him on a beta-blocker at this time.  We will refer him to cardiology for further evaluation to determine appropriateness of beta-blocker therapy versus other rate control or rhythm control therapies.  Advised to seek medical attention for palpitations, stroke symptoms, or any cardiac symptoms.  Discussed bleeding precautions and seeking medical attention if he were to develop uncontrolled bleeding.  Diabetes type 2, controlled Likely uncontrolled.  Check A1c.  Continue current regimen.  Blue toes Concern for PAD and ischemia.  He notes no claudication symptoms though.  He does have palpable PT pulses today.  Will refer urgently to vascular surgery for  further evaluation.  Dyslipidemia Check lipid panel.  Continue Crestor.    Orders Placed This Encounter  Procedures  . HgB A1c  . Lipid panel  . Comp Met (CMET)  . Ambulatory referral to Vascular Surgery    Referral Priority:   Urgent    Referral Type:   Surgical    Referral Reason:   Specialty Services Required    Requested Specialty:   Vascular Surgery    Number of Visits Requested:   1  . Ambulatory referral to Cardiology    Referral Priority:   Routine    Referral Type:   Consultation    Referral Reason:   Specialty Services Required    Requested Specialty:   Cardiology    Number of Visits Requested:   1    Meds ordered this encounter  Medications  .  apixaban (ELIQUIS) 5 MG TABS tablet    Sig: Take 1 tablet (5 mg total) by mouth 2 (two) times daily.    Dispense:  60 tablet    Refill:  2    This visit occurred during the SARS-CoV-2 public health emergency.  Safety protocols were in place, including screening questions prior to the visit, additional usage of staff PPE, and extensive cleaning of exam room while observing appropriate contact time as indicated for disinfecting solutions.    [name], MD [place]

## 2020-05-28 ENCOUNTER — Other Ambulatory Visit (INDEPENDENT_AMBULATORY_CARE_PROVIDER_SITE_OTHER): Payer: Self-pay | Admitting: Vascular Surgery

## 2020-05-28 DIAGNOSIS — R230 Cyanosis: Secondary | ICD-10-CM

## 2020-05-28 DIAGNOSIS — R0989 Other specified symptoms and signs involving the circulatory and respiratory systems: Secondary | ICD-10-CM

## 2020-05-29 ENCOUNTER — Encounter (INDEPENDENT_AMBULATORY_CARE_PROVIDER_SITE_OTHER): Payer: Self-pay | Admitting: Vascular Surgery

## 2020-05-29 ENCOUNTER — Other Ambulatory Visit: Payer: Self-pay

## 2020-05-29 ENCOUNTER — Ambulatory Visit (INDEPENDENT_AMBULATORY_CARE_PROVIDER_SITE_OTHER): Payer: Medicare Other | Admitting: Vascular Surgery

## 2020-05-29 ENCOUNTER — Ambulatory Visit (INDEPENDENT_AMBULATORY_CARE_PROVIDER_SITE_OTHER): Payer: Medicare Other

## 2020-05-29 VITALS — BP 110/61 | HR 69 | Ht 72.0 in | Wt 177.0 lb

## 2020-05-29 DIAGNOSIS — R0989 Other specified symptoms and signs involving the circulatory and respiratory systems: Secondary | ICD-10-CM | POA: Diagnosis not present

## 2020-05-29 DIAGNOSIS — E785 Hyperlipidemia, unspecified: Secondary | ICD-10-CM | POA: Diagnosis not present

## 2020-05-29 DIAGNOSIS — J432 Centrilobular emphysema: Secondary | ICD-10-CM | POA: Diagnosis not present

## 2020-05-29 DIAGNOSIS — I10 Essential (primary) hypertension: Secondary | ICD-10-CM | POA: Diagnosis not present

## 2020-05-29 DIAGNOSIS — I480 Paroxysmal atrial fibrillation: Secondary | ICD-10-CM

## 2020-05-29 DIAGNOSIS — I70221 Atherosclerosis of native arteries of extremities with rest pain, right leg: Secondary | ICD-10-CM

## 2020-05-29 DIAGNOSIS — R230 Cyanosis: Secondary | ICD-10-CM | POA: Diagnosis not present

## 2020-06-05 ENCOUNTER — Other Ambulatory Visit: Payer: Self-pay | Admitting: Family Medicine

## 2020-06-05 DIAGNOSIS — E1159 Type 2 diabetes mellitus with other circulatory complications: Secondary | ICD-10-CM

## 2020-06-06 ENCOUNTER — Encounter (INDEPENDENT_AMBULATORY_CARE_PROVIDER_SITE_OTHER): Payer: Self-pay | Admitting: Vascular Surgery

## 2020-06-06 ENCOUNTER — Telehealth: Payer: Self-pay | Admitting: *Deleted

## 2020-06-06 ENCOUNTER — Telehealth: Payer: Self-pay

## 2020-06-06 DIAGNOSIS — I70229 Atherosclerosis of native arteries of extremities with rest pain, unspecified extremity: Secondary | ICD-10-CM | POA: Insufficient documentation

## 2020-06-06 NOTE — Telephone Encounter (Signed)
-----   Message from Leone Haven, MD sent at 06/05/2020  3:52 PM EDT ----- Noted. I will refer to Catie. It looks like there is a not in the system that he has not been taking his crestor. Can you confirm that with him? It also notes he is not taking his metformin either. Confirm that as well.

## 2020-06-06 NOTE — Progress Notes (Signed)
MRN : [id number]  Zachary Santos is a 84 y.o. (06-Dec-1935) male who presents with chief complaint of  Chief Complaint  Patient presents with  . New Patient (Initial Visit)    Stat. [place]. Blue toes. Abi  .  History of Present Illness:   The patient is seen for evaluation of painful lower extremities and diminished pulses associated with discoloration of the right foot.  The patient notes the the color change has been present for multiple weeks and has not been improving.  It is very painful.  No specific history of trauma noted by the patient.  The patient denies fever or chills.  the patient does have atrial fibrilation which has been difficult to control.  Patient notes prior to the discoloration the extremities were painful particularly with ambulation or activity and the discomfort is very consistent day today. Typically, the pain occurs at less than one block, progress is as activity continues to the point that the patient must stop walking. Resting including standing still for several minutes allowed resumption of the activity and the ability to walk a similar distance before stopping again. Uneven terrain and inclined shorten the distance. The pain has been progressive over the past several years.   The patient denies rest pain or dangling of an extremity off the side of the bed during the night for relief. No prior interventions or surgeries.  No history of back problems or DJD of the lumbar sacral spine.   The patient denies amaurosis fugax or recent TIA symptoms. There are no recent neurological changes noted. The patient denies history of DVT, PE or superficial thrombophlebitis. The patient denies recent episodes of angina or shortness of breath.   Current Meds  Medication Sig  . amLODipine (NORVASC) 5 MG tablet Take 1 tablet by mouth once daily  . apixaban (ELIQUIS) 5 MG TABS tablet Take 1 tablet (5 mg total) by mouth 2 (two) times daily.  [name] aspirin 81 MG EC tablet Take  1 tablet (81 mg total) by mouth daily.  [name] lisinopril (ZESTRIL) 20 MG tablet Take 1 tablet by mouth once daily  . tamsulosin (FLOMAX) 0.4 MG CAPS capsule Take 2 capsules by mouth once daily  . TRADJENTA 5 MG TABS tablet Take 1 tablet by mouth once daily  . vitamin B-12 (CYANOCOBALAMIN) 500 MCG tablet Take 500 mcg by mouth daily.    Past Medical History:  Diagnosis Date  . Anxiety   . BPH (benign prostatic hyperplasia)   . Chronic fatigue   . CKD (chronic kidney disease), stage III   . Claudication [facility])    a. R Hip.  [name] COPD (chronic obstructive pulmonary disease) ([place])   . Dyspnea    with exertion  . Essential hypertension    a. 01/2008 Echo: EF 65%, no rwma, mod increased PASP.  [name] Hypercholesterolemia   . Osteoarthritis   . Paget's disease   . Syncope    a. 03/2015 in setting of hypotension.  . Tobacco abuse    a. 1-1.5 ppd x 60+ yrs.  . Type II diabetes mellitus ([place])   . Wears partial dentures    uppers    Past Surgical History:  Procedure Laterality Date  . APPENDECTOMY    . CATARACT EXTRACTION W/PHACO Left 12/11/2019   Procedure: CATARACT EXTRACTION PHACO AND INTRAOCULAR LENS PLACEMENT ([place]) LEFT DIABETIC MALYUGIN;  Surgeon: Birder Robson, MD;  Location: [place];  Service: Ophthalmology;  Laterality: Left;  CDE 11.45 U/S  1:05.7  .  CATARACT EXTRACTION W/PHACO Right 01/08/2020   Procedure: CATARACT EXTRACTION PHACO AND INTRAOCULAR LENS PLACEMENT ([facility]) RIGHT DIABETIC 5.75  00:49.3;  Surgeon: Birder Robson, MD;  Location: [place];  Service: Ophthalmology;  Laterality: Right;  Diabetic  . laparscopic chole    . SPINE SURGERY     cervical spine surgery  . trigeminal neuralgia     with surgery in [place] History Social History   Tobacco Use  . Smoking status: Current Every Day Smoker    Packs/day: 1.50    Years: 60.00    Pack years: 90.00    Types: Cigarettes  . Smokeless tobacco: Current User  Substance Use Topics  .  Alcohol use: Yes    Alcohol/week: 0.0 standard drinks    Comment: rare - "1% of the time."  . Drug use: No    Family History Family History  Problem Relation Age of Onset  . Cancer Mother        died @ 5.  [name] COPD Father        died @ 61.  . Diabetes Brother        died in early 35's.  No family history of bleeding/clotting disorders, porphyria or autoimmune disease   No Known Allergies   REVIEW OF SYSTEMS (Negative unless checked)  Constitutional: [] Weight loss  [] Fever  [] Chills Cardiac: [] Chest pain   [] Chest pressure   [] Palpitations   [] Shortness of breath when laying flat   [] Shortness of breath with exertion. Vascular:  [x] Pain in legs with walking   [x] Pain in legs at rest  [] History of DVT   [] Phlebitis   [] Swelling in legs   [] Varicose veins   [] Non-healing ulcers Pulmonary:   [] Uses home oxygen   [] Productive cough   [] Hemoptysis   [] Wheeze  [] COPD   [] Asthma Neurologic:  [] Dizziness   [] Seizures   [] History of stroke   [] History of TIA  [] Aphasia   [] Vissual changes   [] Weakness or numbness in arm   [] Weakness or numbness in leg Musculoskeletal:   [] Joint swelling   [] Joint pain   [] Low back pain Hematologic:  [] Easy bruising  [] Easy bleeding   [] Hypercoagulable state   [] Anemic Gastrointestinal:  [] Diarrhea   [] Vomiting  [] Gastroesophageal reflux/heartburn   [] Difficulty swallowing. Genitourinary:  [] Chronic kidney disease   [] Difficult urination  [] Frequent urination   [] Blood in urine Skin:  [] Rashes   [] Ulcers  Psychological:  [] History of anxiety   []  History of major depression.  Physical Examination  Vitals:   05/29/20 1358  BP: 110/61  Pulse: 69  Weight: 177 lb (80.3 kg)  Height: 6' (1.829 m)   Body mass index is 24.01 kg/m. Gen: WD/WN, NAD Head: [place]/AT, No temporalis wasting.  Ear/Nose/Throat: Hearing grossly intact, nares w/o erythema or drainage, poor dentition Eyes: PER, EOMI, sclera nonicteric.  Neck: Supple, no masses.  No bruit or JVD.    Pulmonary:  Good air movement, clear to auscultation bilaterally, no use of accessory muscles.  Cardiac: RRR, normal S1, S2, no Murmurs. Vascular: dusky toes right foot Vessel Right Left  Radial Palpable Palpable  PT Not Palpable Palpable  DP Not Palpable Palpable  Gastrointestinal: soft, non-distended. No guarding/no peritoneal signs.  Musculoskeletal: M/S 5/5 throughout.  No deformity or atrophy.  Neurologic: CN 2-12 intact. Pain and light touch intact in extremities.  Symmetrical.  Speech is fluent. Motor exam as listed above. Psychiatric: Judgment intact, Mood & affect appropriate for pt's clinical situation. Dermatologic: No rashes or  ulcers noted.  No changes consistent with cellulitis.   CBC Lab Results  Component Value Date   WBC 7.9 05/16/2020   HGB 14.7 05/16/2020   HCT 41.6 05/16/2020   MCV 88.7 05/16/2020   PLT 182 05/16/2020    BMET    Component Value Date/Time   NA 138 05/27/2020 1324   NA 140 08/06/2014 0443   K 4.3 05/27/2020 1324   K 3.9 08/06/2014 0443   CL 104 05/27/2020 1324   CL 106 08/06/2014 0443   CO2 26 05/27/2020 1324   CO2 26 08/06/2014 0443   GLUCOSE 193 (H) 05/27/2020 1324   GLUCOSE 117 (H) 08/06/2014 0443   BUN 17 05/27/2020 1324   BUN 19 (H) 08/06/2014 0443   CREATININE 1.41 05/27/2020 1324   CREATININE 1.39 (H) 08/06/2014 0443   CREATININE 1.71 (H) 04/18/2014 1046   CALCIUM 9.4 05/27/2020 1324   CALCIUM 8.3 (L) 08/06/2014 0443   GFRNONAA 44 (L) 05/16/2020 1450   GFRNONAA 53 (L) 08/06/2014 0443   GFRNONAA 38 (L) 04/18/2014 1046   GFRAA 52 (L) 05/16/2020 1450   GFRAA >60 08/06/2014 0443   GFRAA 44 (L) 04/18/2014 1046   Estimated Creatinine Clearance: 45.1 mL/min (by C-G formula based on SCr of 1.41 mg/dL).  COAG Lab Results  Component Value Date   INR 1.1 02/03/2008    Radiology DG Foot Complete Right  Result Date: 05/23/2020 Please see detailed radiograph report in office note.     Assessment/Plan 1. Atherosclerosis  of native artery of right lower extremity with rest pain (HCC) Recommend:  The patient has evidence of severe atherosclerotic changes of both lower extremities with rest pain that is associated with preulcerative changes and impending tissue loss of the right foot.  This represents a limb threatening ischemia and places the patient at the risk for right limb loss.  Patient should undergo angiography of the right lower extremities with the hope for intervention for limb salvage.  The risks and benefits as well as the alternative therapies was discussed in detail with the patient.  All questions were answered.  Patient agrees to proceed with right leg angiography.  The patient will follow up with me in the office after the procedure.   2. Paroxysmal atrial fibrillation (HCC) Continue antiarrhythmia medications as already ordered, these medications have been reviewed and there are no changes at this time.  Continue anticoagulation as ordered by Cardiology Service   3. Essential hypertension, benign Continue antihypertensive medications as already ordered, these medications have been reviewed and there are no changes at this time.   4. Centrilobular emphysema ([place]) Continue statin as ordered and reviewed, no changes at this time  5. Dyslipidemia Continue statin as ordered and reviewed, no changes at this time      Hortencia Pilar, MD  06/06/2020 5:58 PM

## 2020-06-06 NOTE — Chronic Care Management (AMB) (Signed)
  Chronic Care Management   Note  06/06/2020 Name: Zachary Santos MRN: [id number] DOB: [birth year]  Zachary Santos is a 84 y.o. year old male who is a primary care patient of Caryl Bis, Angela Adam, MD. I reached out to Marjory Sneddon by phone today in response to a referral sent by Zachary Santos Patient PCP, Leone Haven, MD.     Zachary Santos was given information about Chronic Care Management services today including:  1. CCM service includes personalized support from designated clinical staff supervised by his physician, including individualized plan of care and coordination with other care providers 2. 24/7 contact phone numbers for assistance for urgent and routine care needs. 3. Service will only be billed when office clinical staff spend 20 minutes or more in a month to coordinate care. 4. Only one practitioner may furnish and bill the service in a calendar month. 5. The patient may stop CCM services at any time (effective at the end of the month) by phone call to the office staff. 6. The patient will be responsible for cost sharing (co-pay) of up to 20% of the service fee (after annual deductible is met).  Patient agreed to services and verbal consent obtained.   Follow up plan: Face to Face appointment with care management team member scheduled for:  06/18/2020  [place], [place], [place] [address] Direct Dial: [email or web address] Website: [email or web address]

## 2020-06-11 ENCOUNTER — Telehealth (INDEPENDENT_AMBULATORY_CARE_PROVIDER_SITE_OTHER): Payer: Self-pay

## 2020-06-11 NOTE — Telephone Encounter (Signed)
I contacted the patient to get him scheduled for a right leg angio with Dr. Delana Meyer and the patient informed me that he wants to hold off from having this procedure for now. I advised the patient to call back when he is ready to be scheduled.

## 2020-06-18 ENCOUNTER — Telehealth: Payer: Self-pay | Admitting: Pharmacist

## 2020-06-18 ENCOUNTER — Ambulatory Visit: Payer: Medicare Other

## 2020-06-18 NOTE — Telephone Encounter (Signed)
  Chronic Care Management   Note  06/18/2020 Name: LAVAUGHN HABERLE MRN: [id number] DOB: [birth year]  Patient did not show for scheduled face to face appointment today.     Plan: - Will collaborate with Care Guide to outreach to schedule follow up with me  Catie Darnelle Maffucci, PharmD, [place], [place] Pharmacist [place] [phone]

## 2020-06-20 ENCOUNTER — Ambulatory Visit: Payer: Medicare Other | Admitting: Family Medicine

## 2020-06-30 ENCOUNTER — Encounter: Payer: Self-pay | Admitting: Cardiology

## 2020-06-30 ENCOUNTER — Ambulatory Visit: Payer: Medicare Other | Admitting: Cardiology

## 2020-06-30 ENCOUNTER — Other Ambulatory Visit: Payer: Self-pay

## 2020-06-30 VITALS — BP 110/60 | HR 58 | Ht 72.0 in | Wt 180.5 lb

## 2020-06-30 DIAGNOSIS — F17200 Nicotine dependence, unspecified, uncomplicated: Secondary | ICD-10-CM | POA: Diagnosis not present

## 2020-06-30 DIAGNOSIS — I480 Paroxysmal atrial fibrillation: Secondary | ICD-10-CM | POA: Diagnosis not present

## 2020-06-30 DIAGNOSIS — E7800 Pure hypercholesterolemia, unspecified: Secondary | ICD-10-CM | POA: Diagnosis not present

## 2020-06-30 DIAGNOSIS — I10 Essential (primary) hypertension: Secondary | ICD-10-CM

## 2020-06-30 MED ORDER — AMIODARONE HCL 200 MG PO TABS
ORAL_TABLET | ORAL | 3 refills | Status: DC
Start: 2020-06-30 — End: 2021-02-23

## 2020-06-30 NOTE — Patient Instructions (Signed)
Medication Instructions:  Your physician has recommended you make the following change in your medication:   1) STOP taking amLODipine (NORVASC).  2) STOP taking Aspirin.  3)  Start amiodarone (PACERONE) 200 MG tablet: Take 1 tablet by mouth two times a day for one week, then take 1 tablet by mouth once daily.  *If you need a refill on your cardiac medications before your next appointment, please call your pharmacy*   Lab Work: None Ordered If you have labs (blood work) drawn today and your tests are completely normal, you will receive your results only by: [name] MyChart Message (if you have MyChart) OR . A paper copy in the mail If you have any lab test that is abnormal or we need to change your treatment, we will call you to review the results.   Testing/Procedures:  1)  Your physician has requested that you have an echocardiogram. Echocardiography is a painless test that uses sound waves to create images of your heart. It provides your doctor with information about the size and shape of your heart and how well your heart's chambers and valves are working. This procedure takes approximately one hour. There are no restrictions for this procedure.   Follow-Up: At [facility], you and your health needs are our priority.  As part of our continuing mission to provide you with exceptional heart care, we have created designated Provider Care Teams.  These Care Teams include your primary Cardiologist (physician) and Advanced Practice Providers (APPs -  Physician Assistants and Nurse Practitioners) who all work together to provide you with the care you need, when you need it.  We recommend signing up for the patient portal called "MyChart".  Sign up information is provided on this After Visit Summary.  MyChart is used to connect with patients for Virtual Visits (Telemedicine).  Patients are able to view lab/test results, encounter notes, upcoming appointments, etc.  Non-urgent messages can be  sent to your provider as well.   To learn more about what you can do with MyChart, go to [email or web address].    Your next appointment:   Follow up after Echo   The format for your next appointment:   In Person  Provider:   Kate Sable, MD   Other Instructions

## 2020-06-30 NOTE — Progress Notes (Signed)
Cardiology Office Note:    Date:  06/30/2020   ID:  Zachary Santos, DOB [birth year], MRN [id number]  PCP:  Leone Haven, MD  [facility] Cardiologist:  Kate Sable, MD  [facility] Electrophysiologist:  None   Referring MD: Leone Haven, MD   Chief Complaint  Patient presents with  . OTHER    Afib/sob . Meds reviewed verbally with pt.    History of Present Illness:    Zachary Santos is a 84 y.o. male with a hx of hypertension, diabetes, paroxysmal atrial fibrillation on Eliquis, COPD, current smoker x 60+ years who presents due to history of A. Fib.  Patient seen in the emergency department on 05/16/2020 due to weakness, presyncope.  In the ED, EKG showed A. fib with RVR heart rates up to 140s.  Subsequent EKG showed A. fib around 93 heart rate.  The ED wait time was too long and patient eventually left without being fully evaluated by an MD.  He eventually followed up with primary care provider who started him on Eliquis 5 mg twice daily.  He states doing okay, denies any bleeding, denies being told he has A. fib before.  Denies any history of heart disease.  Prior echocardiogram in 03/2015 showed normal systolic function, EF 55 to 63%, normal diastolic function.  LA was mildly dilated.  Past Medical History:  Diagnosis Date  . Anxiety   . BPH (benign prostatic hyperplasia)   . Chronic fatigue   . CKD (chronic kidney disease), stage III   . Claudication [facility])    a. R Hip.  [name] COPD (chronic obstructive pulmonary disease) ([place])   . Dyspnea    with exertion  . Essential hypertension    a. 01/2008 Echo: EF 65%, no rwma, mod increased PASP.  [name] Hypercholesterolemia   . Osteoarthritis   . Paget's disease   . Syncope    a. 03/2015 in setting of hypotension.  . Tobacco abuse    a. 1-1.5 ppd x 60+ yrs.  . Type II diabetes mellitus ([place])   . Wears partial dentures    uppers    Past Surgical History:  Procedure Laterality Date  . APPENDECTOMY    . CATARACT  EXTRACTION W/PHACO Left 12/11/2019   Procedure: CATARACT EXTRACTION PHACO AND INTRAOCULAR LENS PLACEMENT ([place]) LEFT DIABETIC MALYUGIN;  Surgeon: Birder Robson, MD;  Location: [place];  Service: Ophthalmology;  Laterality: Left;  CDE 11.45 U/S  1:05.7  . CATARACT EXTRACTION W/PHACO Right 01/08/2020   Procedure: CATARACT EXTRACTION PHACO AND INTRAOCULAR LENS PLACEMENT ([facility]) RIGHT DIABETIC 5.75  00:49.3;  Surgeon: Birder Robson, MD;  Location: [place];  Service: Ophthalmology;  Laterality: Right;  Diabetic  . laparscopic chole    . SPINE SURGERY     cervical spine surgery  . trigeminal neuralgia     with surgery in [place]    Current Medications: Current Meds  Medication Sig  . apixaban (ELIQUIS) 5 MG TABS tablet Take 1 tablet (5 mg total) by mouth 2 (two) times daily.  [name] escitalopram (LEXAPRO) 5 MG tablet Take 1 tablet (5 mg total) by mouth daily.  [name] glipiZIDE (GLUCOTROL XL) 2.5 MG 24 hr tablet TAKE ONE TABLET BY MOUTH ONCE DAILY WITH BREAKFAST  . latanoprost (XALATAN) 0.005 % ophthalmic solution INSTILL 1 DROP INTO EACH EYE AT BEDTIME  . lisinopril (ZESTRIL) 20 MG tablet Take 1 tablet by mouth once daily  . metFORMIN (GLUCOPHAGE) 500 MG tablet Take 1 tablet (500 mg  total) by mouth 2 (two) times daily with a meal.  . ONE TOUCH ULTRA TEST test strip USE TEST STRIP TO CHECK GLUCOSE AT LEAST ONCE DAILY  . rosuvastatin (CRESTOR) 20 MG tablet Take 1 tablet (20 mg total) by mouth daily.  . tamsulosin (FLOMAX) 0.4 MG CAPS capsule Take 2 capsules by mouth once daily  . TRADJENTA 5 MG TABS tablet Take 1 tablet by mouth once daily  . traZODone (DESYREL) 50 MG tablet Take 0.5-1 tablets (25-50 mg total) by mouth at bedtime as needed for sleep.  [name] triamcinolone ointment (KENALOG) 0.1 % APPLY  OINTMENT TOPICALLY TWICE DAILY  . vitamin B-12 (CYANOCOBALAMIN) 500 MCG tablet Take 500 mcg by mouth daily.  . [DISCONTINUED] amLODipine (NORVASC) 5 MG tablet Take 1 tablet by  mouth once daily  . [DISCONTINUED] aspirin 81 MG EC tablet Take 1 tablet (81 mg total) by mouth daily.     Allergies:   Patient has no known allergies.   Social History   Socioeconomic History  . Marital status: Married    Spouse name: Not on file  . Number of children: Not on file  . Years of education: Not on file  . Highest education level: Not on file  Occupational History  . Not on file  Tobacco Use  . Smoking status: Current Every Day Smoker    Packs/day: 1.50    Years: 60.00    Pack years: 90.00    Types: Cigarettes  . Smokeless tobacco: Current User  Substance and Sexual Activity  . Alcohol use: Yes    Alcohol/week: 0.0 standard drinks    Comment: rare - "1% of the time."  . Drug use: No  . Sexual activity: Not on file  Other Topics Concern  . Not on file  Social History Narrative   Lives in [place] with wife.  Retired Administrator.  Very sedentary.  Avoids activity b/c he "gives out" and is limited by right hip/buttock discomfort/claudication.   Social Determinants of Health   Financial Resource Strain: Low Risk   . Difficulty of Paying Living Expenses: Not hard at all  Food Insecurity: No Food Insecurity  . Worried About Charity fundraiser in the Last Year: Never true  . Ran Out of Food in the Last Year: Never true  Transportation Needs: No Transportation Needs  . Lack of Transportation (Medical): No  . Lack of Transportation (Non-Medical): No  Physical Activity:   . Days of Exercise per Week: Not on file  . Minutes of Exercise per Session: Not on file  Stress: No Stress Concern Present  . Feeling of Stress : Only a little  Social Connections: Unknown  . Frequency of Communication with Friends and Family: More than three times a week  . Frequency of Social Gatherings with Friends and Family: Once a week  . Attends Religious Services: Not on file  . Active Member of Clubs or Organizations: Not on file  . Attends Archivist Meetings: Not on  file  . Marital Status: Married     Family History: The patient's family history includes COPD in his father; Cancer in his mother; Diabetes in his brother.  ROS:   Please see the history of present illness.     All other systems reviewed and are negative.  EKGs/Labs/Other Studies Reviewed:    The following studies were reviewed today:   EKG:  EKG is  ordered today.  The ekg ordered today demonstrates sinus bradycardia, heart rate 58, otherwise normal  ECG  Recent Labs: 07/16/2019: TSH 1.51 05/16/2020: Hemoglobin 14.7; Platelets 182 05/27/2020: ALT 16; BUN 17; Creatinine, Ser 1.41; Potassium 4.3; Sodium 138  Recent Lipid Panel    Component Value Date/Time   CHOL 131 05/27/2020 1324   TRIG 153.0 (H) 05/27/2020 1324   HDL 24.60 (L) 05/27/2020 1324   CHOLHDL 5 05/27/2020 1324   VLDL 30.6 05/27/2020 1324   LDLCALC 76 05/27/2020 1324    Physical Exam:    VS:  BP 110/60 (BP Location: Right Arm, Patient Position: Sitting, Cuff Size: Normal)   Pulse (!) 58   Ht 6' (1.829 m)   Wt 180 lb 8 oz (81.9 kg)   SpO2 97%   BMI 24.48 kg/m     Wt Readings from Last 3 Encounters:  06/30/20 180 lb 8 oz (81.9 kg)  05/29/20 177 lb (80.3 kg)  05/27/20 179 lb (81.2 kg)     GEN:  Well nourished, well developed in no acute distress HEENT: Normal NECK: No JVD; No carotid bruits LYMPHATICS: No lymphadenopathy CARDIAC: RRR, no murmurs, rubs, gallops RESPIRATORY:  Clear to auscultation without rales, wheezing or rhonchi  ABDOMEN: Soft, non-tender, non-distended MUSCULOSKELETAL:  No edema; No deformity  SKIN: Warm and dry NEUROLOGIC:  Alert and oriented x 3 PSYCHIATRIC:  Normal affect   ASSESSMENT:    1. Paroxysmal atrial fibrillation (HCC)   2. Essential hypertension   3. Pure hypercholesterolemia   4. Smoking    PLAN:    In order of problems listed above:  1. Patient with paroxysmal atrial fibrillation, CHA2DS2-VASc of 4 (age, htn, dm).  Currently in sinus rhythm.  Currently  asymptomatic.  Patient appears to have severe symptoms when in atrial fibrillation.  We will start amiodarone 200 twice daily x1 week, transition to 200 mg daily.  Continue Eliquis 5 mg twice daily.  Get echocardiogram.  Stop aspirin since starting Eliquis. 2. History of hypertension, blood pressure controlled.  Stop Norvasc with starting amiodarone. 3. History of hyperlipidemia, continue Crestor. 4. Patient is a current smoker, smoking cessation advised.  Follow-up after echocardiogram  Total encounter time 60 minutes  Greater than 50% was spent in counseling and coordination of care with the patient   Medication Adjustments/Labs and Tests Ordered: Current medicines are reviewed at length with the patient today.  Concerns regarding medicines are outlined above.  Orders Placed This Encounter  Procedures  . EKG 12-Lead  . ECHOCARDIOGRAM COMPLETE   Meds ordered this encounter  Medications  . amiodarone (PACERONE) 200 MG tablet    Sig: Take 1 tablet by mouth two times a day for one week, then take 1 tablet by mouth once daily.    Dispense:  35 tablet    Refill:  3    Patient Instructions  Medication Instructions:  Your physician has recommended you make the following change in your medication:   1) STOP taking amLODipine (NORVASC).  2) STOP taking Aspirin.  3)  Start amiodarone (PACERONE) 200 MG tablet: Take 1 tablet by mouth two times a day for one week, then take 1 tablet by mouth once daily.  *If you need a refill on your cardiac medications before your next appointment, please call your pharmacy*   Lab Work: None Ordered If you have labs (blood work) drawn today and your tests are completely normal, you will receive your results only by: [name] MyChart Message (if you have MyChart) OR . A paper copy in the mail If you have any lab test that is abnormal or we  need to change your treatment, we will call you to review the results.   Testing/Procedures:  1)  Your physician  has requested that you have an echocardiogram. Echocardiography is a painless test that uses sound waves to create images of your heart. It provides your doctor with information about the size and shape of your heart and how well your heart's chambers and valves are working. This procedure takes approximately one hour. There are no restrictions for this procedure.   Follow-Up: At [facility], you and your health needs are our priority.  As part of our continuing mission to provide you with exceptional heart care, we have created designated Provider Care Teams.  These Care Teams include your primary Cardiologist (physician) and Advanced Practice Providers (APPs -  Physician Assistants and Nurse Practitioners) who all work together to provide you with the care you need, when you need it.  We recommend signing up for the patient portal called "MyChart".  Sign up information is provided on this After Visit Summary.  MyChart is used to connect with patients for Virtual Visits (Telemedicine).  Patients are able to view lab/test results, encounter notes, upcoming appointments, etc.  Non-urgent messages can be sent to your provider as well.   To learn more about what you can do with MyChart, go to [email or web address].    Your next appointment:   Follow up after Echo   The format for your next appointment:   In Person  Provider:   Kate Sable, MD   Other Instructions      Signed, Kate Sable, MD  06/30/2020 5:25 PM    [place]

## 2020-07-02 ENCOUNTER — Telehealth: Payer: Self-pay

## 2020-07-02 NOTE — Chronic Care Management (AMB) (Signed)
  Care Management   Note  07/02/2020 Name: Zachary Santos MRN: [id number] DOB: July 25, 1936  Zachary Santos is a 84 y.o. year old male who is a primary care patient of Leone Haven, MD and is actively engaged with the care management team. I reached out to Marjory Sneddon by phone today to assist with re-scheduling an initial visit with the Pharmacist  Follow up plan: The care management team will reach out to the patient again over the next 7 days.  If patient returns call to provider office, please advise to call [place] [phone]   Noreene Larsson, [place], [place], [place] [address] Direct Dial: [phone] [email or web address] Website: [email or web address]

## 2020-07-03 ENCOUNTER — Ambulatory Visit (INDEPENDENT_AMBULATORY_CARE_PROVIDER_SITE_OTHER): Payer: Medicare Other

## 2020-07-03 ENCOUNTER — Encounter: Payer: Self-pay | Admitting: *Deleted

## 2020-07-03 ENCOUNTER — Other Ambulatory Visit: Payer: Self-pay

## 2020-07-03 DIAGNOSIS — I480 Paroxysmal atrial fibrillation: Secondary | ICD-10-CM

## 2020-07-03 LAB — ECHOCARDIOGRAM COMPLETE
Area-P 1/2: 3.33 cm2
S' Lateral: 2.5 cm

## 2020-07-07 NOTE — Chronic Care Management (AMB) (Signed)
  Care Management   Note  07/07/2020 Name: Zachary Santos MRN: [id number] DOB: 1936/03/19  Zachary Santos is a 84 y.o. year old male who is a primary care patient of Leone Haven, MD and is actively engaged with the care management team. I reached out to Zachary Santos by phone today to assist with re-scheduling an initial visit with the Pharmacist  Follow up plan: Face to Face appointment with care management team member scheduled for: 08/01/2020  Noreene Larsson, [place], [place], [place] [address] Direct Dial: [phone] [email or web address]: [email or web address]

## 2020-07-25 ENCOUNTER — Ambulatory Visit: Payer: Medicare Other | Admitting: Cardiology

## 2020-08-01 ENCOUNTER — Ambulatory Visit: Payer: Medicare Other

## 2020-08-01 ENCOUNTER — Telehealth: Payer: Self-pay | Admitting: Pharmacist

## 2020-08-01 NOTE — Chronic Care Management (AMB) (Signed)
  Chronic Care Management   Note  08/01/2020 Name: Zachary Santos MRN: [id number] DOB: 1936/05/16   Attempted to contact patient for scheduled appointment for medication management support. Left HIPAA compliant message for patient to return my call at their convenience.    Plan: - If I do not hear back from the patient by end of business today, will collaborate with Care Guide to outreach to schedule follow up with me   Catie Darnelle Maffucci, PharmD, [place], [place] Pharmacist [place] [phone]

## 2020-08-04 ENCOUNTER — Other Ambulatory Visit: Payer: Self-pay | Admitting: Family Medicine

## 2020-08-04 DIAGNOSIS — E785 Hyperlipidemia, unspecified: Secondary | ICD-10-CM

## 2020-08-05 ENCOUNTER — Telehealth: Payer: Self-pay

## 2020-08-05 NOTE — Chronic Care Management (AMB) (Signed)
  Care Management   Note  08/05/2020 Name: [name] MRN: [id number] DOB: April 07, 1936  Zachary Santos is a 84 y.o. year old male who is a primary care patient of Leone Haven, MD and is actively engaged with the care management team. I reached out to Zachary Santos by phone today to assist with re-scheduling an initial visit with the Pharmacist  Follow up plan: Patient declines further follow up and engagement by the care management team. Appropriate care team members and provider have been notified via electronic communication.   Pt states that he will call back when ready to schedule.  Noreene Larsson, [place], [place], [place] [address] Direct Dial: [phone] [email or web address]

## 2020-08-05 NOTE — Telephone Encounter (Signed)
Hey Zachary Santos   Pt declined r/s appt at this time and states that he will call back when he is ready to r/s.  Museum/gallery conservator

## 2020-08-18 ENCOUNTER — Encounter: Payer: Self-pay | Admitting: Family Medicine

## 2020-08-18 ENCOUNTER — Other Ambulatory Visit: Payer: Self-pay

## 2020-08-18 ENCOUNTER — Ambulatory Visit (INDEPENDENT_AMBULATORY_CARE_PROVIDER_SITE_OTHER): Payer: Medicare Other | Admitting: Family Medicine

## 2020-08-18 VITALS — BP 140/70 | HR 50 | Temp 97.7°F | Ht 72.0 in | Wt 185.0 lb

## 2020-08-18 DIAGNOSIS — Z23 Encounter for immunization: Secondary | ICD-10-CM

## 2020-08-18 DIAGNOSIS — I480 Paroxysmal atrial fibrillation: Secondary | ICD-10-CM

## 2020-08-18 DIAGNOSIS — E1159 Type 2 diabetes mellitus with other circulatory complications: Secondary | ICD-10-CM | POA: Diagnosis not present

## 2020-08-18 DIAGNOSIS — I10 Essential (primary) hypertension: Secondary | ICD-10-CM | POA: Diagnosis not present

## 2020-08-18 DIAGNOSIS — G4700 Insomnia, unspecified: Secondary | ICD-10-CM | POA: Diagnosis not present

## 2020-08-18 DIAGNOSIS — R0789 Other chest pain: Secondary | ICD-10-CM

## 2020-08-18 MED ORDER — OZEMPIC (0.25 OR 0.5 MG/DOSE) 2 MG/1.5ML ~~LOC~~ SOPN: PEN_INJECTOR | SUBCUTANEOUS | 1 refills | Status: DC

## 2020-08-18 NOTE — Patient Instructions (Signed)
Nice to see you. We will see if we can get Ozempic approved by your insurance.  If you are able to start on this you will discontinue the Tradjenta. We will get you referred to our clinical pharmacist to get her help regarding your diabetes.

## 2020-08-18 NOTE — Assessment & Plan Note (Signed)
Adequate control for age.  He will continue lisinopril 20 mg once daily.

## 2020-08-18 NOTE — Assessment & Plan Note (Signed)
Sounded to be in sinus rhythm today.  He will continue to monitor for symptoms and continue medication management through cardiology.

## 2020-08-18 NOTE — Assessment & Plan Note (Signed)
Uncontrolled based on most recent A1c.  Discussed transitioning him from [place] to [place].  We will see if this is covered.  We will also refer to chronic care management services to help with diabetes management as well as medication reconciliation.

## 2020-08-18 NOTE — Assessment & Plan Note (Signed)
Symptoms are consistent with a musculoskeletal cause.  He will monitor.

## 2020-08-18 NOTE — Assessment & Plan Note (Signed)
I think a lot of his issues are driven by too much screen time.  Discussed decreasing screen time and picking a bedtime and wake time to stick to.  Discussed trying to avoid napping during the day.

## 2020-08-18 NOTE — Progress Notes (Signed)
[name], MD Phone: [phone]  Zachary Santos is a 84 y.o. male who presents today for f/u.  DIABETES Disease Monitoring: Blood Sugar ranges-not checking Polyuria/phagia/dipsia- no      Optho- UTD Medications: Compliance- taking tradjenta 5 mg daily Hypoglycemic symptoms- rare Notes no personal or family history of thyroid cancer, parathyroid cancer, or adrenal cancer.  HYPERTENSION/atrial fibrillation Disease Monitoring  Home BP Monitoring not checking Chest pressure- no    Dyspnea- no Medications  Compliance-  Taking lisinopril amiodarone, eliquis.   Edema- occasional, no orthopnea or PND No palpitations or bleeding issues.  Difficulty sleeping: Patient notes he feels as though he has gotten his days and nights mixed up.  He stays up late looking at his phone and watching TV until about 3 in the morning and then he will doze off during the day.  He feels like he needs to get his schedule switched around.  Musculoskeletal chest discomfort: Patient notes slight discomfort in his costosternal area only when he crosses his arms.  He has no exertional complaints.  Resolves relatively quickly after he uncrosses his arms.   Social History   Tobacco Use  Smoking Status Current Every Day Smoker  . Packs/day: 1.50  . Years: 60.00  . Pack years: 90.00  . Types: Cigarettes  Smokeless Tobacco Current User     ROS see history of present illness  Objective  Physical Exam Vitals:   08/18/20 1457  BP: 140/70  Pulse: (!) 50  Temp: 97.7 F (36.5 C)  SpO2: 95%    BP Readings from Last 3 Encounters:  08/18/20 140/70  06/30/20 110/60  05/29/20 110/61   Wt Readings from Last 3 Encounters:  08/18/20 185 lb (83.9 kg)  06/30/20 180 lb 8 oz (81.9 kg)  05/29/20 177 lb (80.3 kg)    Physical Exam Constitutional:      General: He is not in acute distress.    Appearance: He is not diaphoretic.  Cardiovascular:     Rate and Rhythm: Normal rate and regular rhythm.      Heart sounds: Normal heart sounds.  Pulmonary:     Effort: Pulmonary effort is normal.     Breath sounds: Normal breath sounds.  Chest:     Chest wall: No tenderness.  Musculoskeletal:     Right lower leg: No edema.     Left lower leg: No edema.  Skin:    General: Skin is warm and dry.  Neurological:     Mental Status: He is alert.      Assessment/Plan: Please see individual problem list.  Problem List Items Addressed This Visit    A-fib ([place])    Sounded to be in sinus rhythm today.  He will continue to monitor for symptoms and continue medication management through cardiology.      DM type 2 (diabetes mellitus, type 2) ([place]) - Primary    Uncontrolled based on most recent A1c.  Discussed transitioning him from [place] to [place].  We will see if this is covered.  We will also refer to chronic care management services to help with diabetes management as well as medication reconciliation.      Relevant Medications   Semaglutide,0.25 or 0.5MG /DOS, (OZEMPIC, 0.25 OR 0.5 MG/DOSE,) 2 MG/1.5ML SOPN   Other Relevant Orders   Ambulatory referral to Chronic Care Management Services   Essential hypertension, benign    Adequate control for age.  He will continue lisinopril 20 mg once daily.      Insomnia  I think a lot of his issues are driven by too much screen time.  Discussed decreasing screen time and picking a bedtime and wake time to stick to.  Discussed trying to avoid napping during the day.      Musculoskeletal chest pain    Symptoms are consistent with a musculoskeletal cause.  He will monitor.       Other Visit Diagnoses    Need for immunization against influenza       Relevant Orders   Flu Vaccine QUAD High Dose(Fluad) (Completed)      This visit occurred during the SARS-CoV-2 public health emergency.  Safety protocols were in place, including screening questions prior to the visit, additional usage of staff PPE, and extensive cleaning of exam room while  observing appropriate contact time as indicated for disinfecting solutions.    [name], MD [place]

## 2020-08-19 ENCOUNTER — Telehealth: Payer: Self-pay

## 2020-08-19 NOTE — Chronic Care Management (AMB) (Signed)
  Care Management   Note  08/19/2020 Name: ARAGON SCARANTINO MRN: [id number] DOB: [birth year]  Marjory Sneddon is a 84 y.o. year old male who is a primary care patient of Caryl Bis, Angela Adam, MD and is actively engaged with the care management team. I reached out to Marjory Sneddon by phone today to assist with scheduling an initial visit with the Pharmacist  Follow up plan: Telephone appointment with care management team member scheduled for:09/10/2020  Noreene Larsson, [place] [address], [place], [place] [address] Direct Dial: [phone] [email or web address]: [email or web address]

## 2020-09-10 ENCOUNTER — Ambulatory Visit: Payer: Medicare Other | Admitting: Pharmacist

## 2020-09-10 ENCOUNTER — Other Ambulatory Visit: Payer: Self-pay

## 2020-09-10 DIAGNOSIS — I480 Paroxysmal atrial fibrillation: Secondary | ICD-10-CM

## 2020-09-10 DIAGNOSIS — E1159 Type 2 diabetes mellitus with other circulatory complications: Secondary | ICD-10-CM

## 2020-09-10 DIAGNOSIS — I10 Essential (primary) hypertension: Secondary | ICD-10-CM

## 2020-09-10 MED ORDER — APIXABAN 5 MG PO TABS: 5.0000 mg | ORAL_TABLET | Freq: Two times a day (BID) | ORAL | 3 refills | Status: DC

## 2020-09-10 MED ORDER — LISINOPRIL 20 MG PO TABS: 20.0000 mg | ORAL_TABLET | Freq: Every day | ORAL | 3 refills | Status: DC

## 2020-09-10 MED ORDER — METFORMIN HCL ER 500 MG PO TB24: 500.0000 mg | ORAL_TABLET | Freq: Two times a day (BID) | ORAL | 1 refills | Status: DC

## 2020-09-10 NOTE — Patient Instructions (Addendum)
Zachary Santos,   It was great meeting you today!  For diabetes: STOP TRADJENTA. Start metformin XR 500 mg. Take 1 tablet twice daily. Please check your blood sugars a few times weekly and write down these readings. Our goal is an A1c of less than 7%, and that corresponds with fasting sugars less than 130 and 2 hour after meal sugars less than 180. Bring those blood sugar readings to your next appointment with Dr. Caryl Bis.   Call the pharmacy to ask for refills on the tamsulosin and the amiodarone  Here is how you should fill your pill box and what each medication is for. When you get home, add lisinopril and metformin to your pill box.  Morning: - Lisinopril 20 mg - blood pressure - Rosuvastatin 20 mg - cholesterol and heart attack/stroke prevention - Eliquis 5 mg - blood thinner for stroke prevention - Metformin XR 500 mg - blood sugars - Amiodarone 200 mg - atrial fibrillation  Evening: - Eliquis 5 mg - blood thinner for stroke prevention - Metformin XR 500 mg - blood sugars - Tamsulosin 0.4 mg x 2 capsules - prostate health  I scheduled a follow up phone call with you in about 6 weeks (about 4 weeks after your appointment with Dr. Caryl Bis). We will chat about your blood sugars and answer any questions you have about your medications.    Call me at any time with any questions or concerns!  Catie Darnelle Maffucci, PharmD [phone]    Visit Information  Patient Care Plan: Medication Management    Problem Identified: Diabetes, Atrial Fibrillation     Long-Range Goal: Disease Progression Prevention   Note:   Current Barriers:  . Unable to independently afford treatment regimen . Unable to achieve control of diabetes   Pharmacist Clinical Goal(s):  [name] Over the next 90 days, patient will verbalize ability to afford treatment regimen. . Over the next 90 days, patient will achieve control of diabetes as evidenced by improvement in A1c  Interventions: . Inter-disciplinary care  team collaboration (see longitudinal plan of care) . Comprehensive medication review performed; medication list updated in electronic medical record  Medication Management: . Not using a pill box. Taking all medications together QAM, and only taking Eliquis daily. Unsure what medications are for.  . Provided with BID pill box and medication list of how to fill pill box.   Diabetes: . Uncontrolled; current treatment: Tradjenta 5 mg daily - reports that this medication is expensive. He never picked up Ozempic from the pharmacy. Doesn't remember taking metformin (though was prescribed, he doesn't remember any intolerability concerns).   . Current glucose readings: not checking . Educated on goal A1c, goal fasting, and goal 2 hour post prandial glucose readings. Reviewed micro and macrovascular complications of diabetes.  . Renal function appropriate, and patient has no memory of not tolerating metformin. D/c Tradjenta d/t cost and low efficacy, start metformin XR 500 mg BID. Patient counseled to take with food. Counseled on how to fill pill box . Provided with blood glucose logs. Encouraged to check glucose periodically, document, and provide at next PCP appt  Hyperlipidemia: . Uncontrolled; current treatment: atorvastatin 20 mg daily  . Educated on indication for medications. Counseled on how to fill pill box. . Recommended to continue current regimen at this time.  May consider dose titration moving forward.   Atrial Fibrillation: . Appropriately managed; current rhythm control: amiodarone 200 mg daily; anticoagulant treatment: Eliquis 5 mg BID, though has been taking daily  . Hypertension -  lisinopril 20 mg daily. Patient due for refill. Sent. . Educated on indication for medications. Counseled on how to fill pill box.   . Recommended to continue current regimen and collaboration with cardiology at this time . Discussed that we will evaluate for patient assistance for 2022, but too late in  the year to apply at this time  BPH: . Controlled; current regimen: tamsulosin 0.8 mg daily (2 0.4 mg capsules) . Recommended to continue current regimen at this time  Patient Goals/Self-Care Activities . Over the next 90 days, patient will:  - take medications as prescribed check blood glucose periodically, document, and provide at future appointments  Follow Up Plan: Telephone follow up appointment with care management team member scheduled for: ~ 8 weeks (~ 4 weeks after PCP visit)       Zachary Santos was given information about Chronic Care Management services today including:  1. CCM service includes personalized support from designated clinical staff supervised by his physician, including individualized plan of care and coordination with other care providers 2. 24/7 contact phone numbers for assistance for urgent and routine care needs. 3. Service will only be billed when office clinical staff spend 20 minutes or more in a month to coordinate care. 4. Only one practitioner may furnish and bill the service in a calendar month. 5. The patient may stop CCM services at any time (effective at the end of the month) by phone call to the office staff. 6. The patient will be responsible for cost sharing (co-pay) of up to 20% of the service fee (after annual deductible is met).  Patient agreed to services and verbal consent obtained.   Print copy of patient instructions, educational materials, and care plan provided in person.    Plan: Telephone follow up appointment with care management team member scheduled for:~ 8 weeks  Catie Darnelle Maffucci, PharmD, [place], [place] Pharmacist [place] [phone]

## 2020-09-10 NOTE — Chronic Care Management (AMB) (Signed)
Chronic Care Management   Pharmacy Note  09/10/2020 Name: Zachary Santos MRN: [id number] DOB: 10/11/1936   Subjective:  Zachary Santos is a 84 y.o. year old male who is a primary care patient of Caryl Bis, Angela Adam, MD. The CCM team was consulted for assistance with chronic disease management and care coordination needs.    Engaged with patient face to face for initial visit in response to provider referral for pharmacy case management and/or care coordination services.   Consent to Services:  Zachary Santos was given the following information about Chronic Care Management services today, agreed to services, and gave verbal consent: 1.CCM service includes personalized support from designated clinical staff supervised by his physician, including individualized plan of care and coordination with other care providers 2. 24/7 contact phone numbers for assistance for urgent and routine care needs. 3. Service will only be billed when office clinical staff spend 20 minutes or more in a month to coordinate care. 4. Only one practitioner may furnish and bill the service in a calendar month. 5.The patient may stop CCM services at any time (effective at the end of the month) by phone call to the office staff. 6. The patient will be responsible for cost sharing (co-pay) of up to 20% of the service fee (after annual deductible is met).  SDOH (Social Determinants of Health) assessments and interventions performed:  SDOH Interventions     Most Recent Value  SDOH Interventions  Financial Strain Interventions Other (Comment)  [will discuss manufacturer assistance moving forward]       Objective:  Lab Results  Component Value Date   CREATININE 1.41 05/27/2020   CREATININE 1.46 (H) 05/16/2020   CREATININE 1.35 07/16/2019    Lab Results  Component Value Date   HGBA1C 9.6 (H) 05/27/2020       Component Value Date/Time   CHOL 131 05/27/2020 1324   TRIG 153.0 (H) 05/27/2020 1324   HDL 24.60 (L) 05/27/2020  1324   CHOLHDL 5 05/27/2020 1324   VLDL 30.6 05/27/2020 1324   LDLCALC 76 05/27/2020 1324    CHA2DS2-VASc Score = 4  This indicates a 4.8% annual risk of stroke. The patient's score is based upon: CHF History: 0 HTN History: 1 Diabetes History: 1 Stroke History: 0 Vascular Disease History: 0 Age Score: 2 Gender Score: 0     BP Readings from Last 3 Encounters:  08/18/20 140/70  06/30/20 110/60  05/29/20 110/61    Assessment/Interventions: Review of patient past medical history, allergies, medications, health status, including review of consultants reports, laboratory and other test data, was performed as part of comprehensive evaluation and provision of chronic care management services.   No Known Allergies  Medications Reviewed Today    Reviewed by De Hollingshead, RPH-CPP (Pharmacist) on 09/10/20 at 37  Med List Status: <None>  Medication Order Taking? Sig Documenting Provider Last Dose Status Informant  amiodarone (PACERONE) 200 MG tablet [id number] Yes Take 1 tablet by mouth two times a day for one week, then take 1 tablet by mouth once daily. Kate Sable, MD Taking Active   apixaban (ELIQUIS) 5 MG TABS tablet [id number] Yes Take 1 tablet (5 mg total) by mouth 2 (two) times daily. Leone Haven, MD  Active   latanoprost (XALATAN) 0.005 % ophthalmic solution [id number] Yes INSTILL 1 DROP INTO EACH EYE AT BEDTIME [provider] Taking Active   lisinopril (ZESTRIL) 20 MG tablet [id number] Yes Take 1 tablet (20 mg total) by mouth daily. Leone Haven,  MD Taking Active   metFORMIN (GLUCOPHAGE XR) 500 MG 24 hr tablet [id number]  Take 1 tablet (500 mg total) by mouth in the morning and at bedtime. Leone Haven, MD  Active   ONE TOUCH ULTRA TEST test strip [id number] Yes USE TEST STRIP TO CHECK GLUCOSE AT LEAST ONCE DAILY [name], DO Taking Active Self  rosuvastatin (CRESTOR) 20 MG tablet [id number] Yes Take 1 tablet by mouth once daily  Leone Haven, MD Taking Active   tamsulosin [facility]) 0.4 MG CAPS capsule [id number] Yes Take 2 capsules by mouth once daily Leone Haven, MD Taking Active   vitamin B-12 (CYANOCOBALAMIN) 500 MCG tablet [id number] Yes Take 500 mcg by mouth daily. [provider] Taking Active           Patient Active Problem List   Diagnosis Date Noted  . Musculoskeletal chest pain 08/18/2020  . Atherosclerosis 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 ([place]) 06/06/2020  . A-fib ([place]) 05/27/2020  . Blue toes 05/27/2020  . Insomnia 11/23/2019  . Anxiety and depression 07/17/2019  . Erectile dysfunction 08/29/2018  . Elevated alkaline phosphatase level 08/02/2017  . Fatigue 05/02/2017  . Dyslipidemia 11/01/2016  . Right leg claudication ([place]) 06/20/2015  . CKD (chronic kidney disease), stage III ([place])   . DM type 2 (diabetes mellitus, type 2) ([place]) 09/12/2013  . Osteoarthritis of right knee 06/07/2013  . BPH (benign prostatic hyperplasia) 02/02/2010  . Essential hypertension, benign 02/05/2008  . COPD (chronic obstructive pulmonary disease) ([place]) 02/05/2008  . 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 Unable to independently afford treatment regimen . Unable to achieve control of diabetes   Pharmacist Clinical Goal(s):  [name] Over the next 90 days, patient will verbalize ability to afford treatment regimen. . Over the next 90 days, patient will achieve control of diabetes as evidenced by improvement in A1c  Interventions: . Inter-disciplinary care team collaboration (see longitudinal plan of care) . Comprehensive medication review performed; medication list updated in electronic medical record  Medication Management: . Not  using a pill box. Taking all medications together QAM, and only taking Eliquis daily. Unsure what medications are for.  . Provided with BID pill box and medication list of how to fill pill box.   Diabetes: . Uncontrolled; current treatment: Tradjenta 5 mg daily - reports that this medication is expensive. He never picked up Ozempic from the pharmacy. Doesn't remember taking metformin (though was prescribed, he doesn't remember any intolerability concerns).   . Current glucose readings: not checking . Educated on goal A1c, goal fasting, and goal 2 hour post prandial glucose readings. Reviewed micro and macrovascular complications of diabetes.  . Renal function appropriate, and patient has no memory of not tolerating metformin. D/c Tradjenta d/t cost and low efficacy, start metformin XR 500 mg BID. Patient counseled to take with food. Counseled on how to fill pill box . Provided with blood glucose logs. Encouraged to check glucose periodically, document, and provide at next PCP appt  Hyperlipidemia: . Uncontrolled; current treatment: atorvastatin 20 mg daily  . Educated on indication for medications. Counseled on how to fill pill box. . Recommended to continue current regimen at this time.  May consider dose titration moving forward.   Atrial  Fibrillation: . Appropriately managed; current rhythm control: amiodarone 200 mg daily; anticoagulant treatment: Eliquis 5 mg BID, though has been taking daily  . Hypertension - lisinopril 20 mg daily. Patient due for refill. Sent. . Educated on indication for medications. Counseled on how to fill pill box.   . Recommended to continue current regimen and collaboration with cardiology at this time . Discussed that we will evaluate for patient assistance for 2022, but too late in the year to apply at this time  BPH: . Controlled; current regimen: tamsulosin 0.8 mg daily (2 0.4 mg capsules) . Recommended to continue current regimen at this time  Patient  Goals/Self-Care Activities . Over the next 90 days, patient will:  - take medications as prescribed check blood glucose periodically, document, and provide at future appointments  Follow Up Plan: Telephone follow up appointment with care management team member scheduled for: ~ 8 weeks (~ 4 weeks after PCP visit)        Plan: Telephone follow up appointment with care management team member scheduled for:~ 8 weeks  Catie Darnelle Maffucci, PharmD, [place], CPP Clinical Pharmacist [place] [phone]

## 2020-09-15 ENCOUNTER — Telehealth: Payer: Self-pay

## 2020-09-15 NOTE — Telephone Encounter (Signed)
Pt said the last time he got Eliquis it was $47 now it is $239. He wants to know is there something else he can take or what do you recommend he do. He would like a call today.

## 2020-09-16 ENCOUNTER — Ambulatory Visit: Payer: Medicare Other | Admitting: Pharmacist

## 2020-09-16 DIAGNOSIS — I480 Paroxysmal atrial fibrillation: Secondary | ICD-10-CM

## 2020-09-16 DIAGNOSIS — I10 Essential (primary) hypertension: Secondary | ICD-10-CM

## 2020-09-16 DIAGNOSIS — E1159 Type 2 diabetes mellitus with other circulatory complications: Secondary | ICD-10-CM

## 2020-09-16 MED ORDER — BLOOD GLUCOSE MONITOR KIT: PACK | 0 refills | Status: AC

## 2020-09-16 NOTE — Patient Instructions (Signed)
Visit Information  Patient Care Plan: Medication Management    Problem Identified: Diabetes, Atrial Fibrillation     Long-Range Goal: Disease Progression Prevention   Note:   Current Barriers:  . Unable to independently afford treatment regimen . Unable to achieve control of diabetes   Pharmacist Clinical Goal(s):  [name] Over the next 90 days, patient will verbalize ability to afford treatment regimen. . Over the next 90 days, patient will achieve control of diabetes as evidenced by improvement in A1c  Interventions: . Inter-disciplinary care team collaboration (see longitudinal plan of care) . Comprehensive medication review performed; medication list updated in electronic medical record  Medication Management: . Provided pill box at last visit. Patient confirms that he is using  Diabetes: . Uncontrolled; current treatment: metformin XR 500 mg BID . Current glucose readings: reports he dropped his meter and needs a new script . Script sent for new glucometer   Hyperlipidemia: . Uncontrolled; current treatment: atorvastatin 20 mg daily  . Recommended to continue current regimen at this time.  May consider dose titration moving forward.   Atrial Fibrillation: . Appropriately managed; current rhythm control: amiodarone 200 mg daily; anticoagulant treatment: Eliquis 5 mg BID- calls to report that copay is now >$200 for 30 day supply (likely in Medicare Coverage Gap) . Hypertension - lisinopril 20 mg daily . Communicated with cardiology. Communicated with Eliquis samples representative. Will collaborate to obtain samples to tide patient over until he can fill in January when out of Coverage Gap  BPH: . Controlled; current regimen: tamsulosin 0.8 mg daily (2 0.4 mg capsules) . Recommended to continue current regimen at this time  Patient Goals/Self-Care Activities . Over the next 90 days, patient will:  - take medications as prescribed check blood glucose periodically, document,  and provide at future appointments  Follow Up Plan: Telephone follow up appointment with care management team member scheduled for: ~ 8 weeks (~ 4 weeks after PCP visit)       The patient verbalized understanding of instructions, educational materials, and care plan provided today and declined offer to receive copy of patient instructions, educational materials, and care plan.    Plan: Telephone follow up appointment with care management team member scheduled for: ~ 8 weeks  Catie Darnelle Maffucci, PharmD, [place], [place] Pharmacist [place] [phone]

## 2020-09-16 NOTE — Chronic Care Management (AMB) (Signed)
Chronic Care Management   Pharmacy Note  09/16/2020 Name: Zachary Santos MRN: [id number] DOB: [birth year]   Subjective:  Zachary Santos is a 84 y.o. year old male who is a primary care patient of Zachary Santos, Zachary Adam, MD. The CCM team was consulted for assistance with chronic disease management and care coordination needs.    Care coordination completed today.  in response to provider referral for pharmacy case management and/or care coordination services.   Consent to Services:  [name] was given information about Chronic Care Management services, agreed to services, and gave verbal consent prior to initiation of services on 09/10/20. Please see initial visit note for detailed documentation.   SDOH (Social Determinants of Health) assessments and interventions performed:  SDOH Interventions     Most Recent Value  SDOH Interventions  Financial Strain Interventions Other (Comment)  [manufacturer assistance and sample obtainment]       Objective:  Lab Results  Component Value Date   CREATININE 1.41 05/27/2020   CREATININE 1.46 (H) 05/16/2020   CREATININE 1.35 07/16/2019    Lab Results  Component Value Date   HGBA1C 9.6 (H) 05/27/2020       Component Value Date/Time   CHOL 131 05/27/2020 1324   TRIG 153.0 (H) 05/27/2020 1324   HDL 24.60 (L) 05/27/2020 1324   CHOLHDL 5 05/27/2020 1324   VLDL 30.6 05/27/2020 1324   LDLCALC 76 05/27/2020 1324    BP Readings from Last 3 Encounters:  08/18/20 140/70  06/30/20 110/60  05/29/20 110/61    Assessment/Interventions: Review of patient past medical history, allergies, medications, health status, including review of consultants reports, laboratory and other test data, was performed as part of comprehensive evaluation and provision of chronic care management services.   No Known Allergies  Medications Reviewed Today    Reviewed by De Hollingshead, RPH-CPP (Pharmacist) on 09/10/20 at 45  Med List Status: <None>  Medication  Order Taking? Sig Documenting Provider Last Dose Status Informant  amiodarone (PACERONE) 200 MG tablet [id number] Yes Take 1 tablet by mouth two times a day for one week, then take 1 tablet by mouth once daily. Kate Sable, MD Taking Active   apixaban (ELIQUIS) 5 MG TABS tablet [id number] Yes Take 1 tablet (5 mg total) by mouth 2 (two) times daily. Leone Haven, MD  Active   latanoprost (XALATAN) 0.005 % ophthalmic solution [id number] Yes INSTILL 1 DROP INTO EACH EYE AT BEDTIME [provider] Taking Active   lisinopril (ZESTRIL) 20 MG tablet [id number] Yes Take 1 tablet (20 mg total) by mouth daily. Leone Haven, MD Taking Active   metFORMIN (GLUCOPHAGE XR) 500 MG 24 hr tablet [id number]  Take 1 tablet (500 mg total) by mouth in the morning and at bedtime. Leone Haven, MD  Active   ONE TOUCH ULTRA TEST test strip [id number] Yes USE TEST STRIP TO CHECK GLUCOSE AT LEAST ONCE DAILY [name], DO Taking Active Self  rosuvastatin (CRESTOR) 20 MG tablet [id number] Yes Take 1 tablet by mouth once daily Leone Haven, MD Taking Active   tamsulosin [facility]) 0.4 MG CAPS capsule [id number] Yes Take 2 capsules by mouth once daily Leone Haven, MD Taking Active   vitamin B-12 (CYANOCOBALAMIN) 500 MCG tablet [id number] Yes Take 500 mcg by mouth daily. [provider] Taking Active           Patient Active Problem List   Diagnosis Date Noted  . Musculoskeletal chest pain 08/18/2020  . Atherosclerosis  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 ([place]) 06/06/2020  . A-fib ([place]) 05/27/2020  . Blue toes 05/27/2020  . Insomnia 11/23/2019  . Anxiety and depression 07/17/2019  . Erectile dysfunction 08/29/2018  . Elevated alkaline phosphatase level 08/02/2017  . Fatigue 05/02/2017  . Dyslipidemia 11/01/2016  . Right leg claudication ([place]) 06/20/2015  . CKD (chronic kidney disease), stage III ([place])   . DM type 2 (diabetes mellitus, type 2) ([place]) 09/12/2013    . Osteoarthritis of right knee 06/07/2013  . BPH (benign prostatic hyperplasia) 02/02/2010  . Essential hypertension, benign 02/05/2008  . COPD (chronic obstructive pulmonary disease) ([place]) 02/05/2008  . 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 Unable to independently afford treatment regimen . Unable to achieve control of diabetes   Pharmacist Clinical Goal(s):  [name] Over the next 90 days, patient will verbalize ability to afford treatment regimen. . Over the next 90 days, patient will achieve control of diabetes as evidenced by improvement in A1c  Interventions: . Inter-disciplinary care team collaboration (see longitudinal plan of care) . Comprehensive medication review performed; medication list updated in electronic medical record  Medication Management: . Provided pill box at last visit. Patient confirms that he is using  Diabetes: . Uncontrolled; current treatment: metformin XR 500 mg BID . Current glucose readings: reports he dropped his meter and needs a new script . Script sent for new glucometer   Hyperlipidemia: . Uncontrolled; current treatment: atorvastatin 20 mg daily  . Recommended to continue current regimen at this time.  May consider dose titration moving forward.   Atrial Fibrillation: . Appropriately managed; current rhythm control: amiodarone 200 mg daily; anticoagulant treatment: Eliquis 5 mg BID- calls to report that copay is now >$200 for 30 day supply (likely in Medicare Coverage Gap) . Hypertension - lisinopril 20 mg daily . Communicated with cardiology. Discussed potential for switch to Xarelto w/ [employer] savings program, however, would need to dose reduce d/t patient renal function. Communicated with Eliquis samples  representative. Will collaborate to obtain samples to tide patient over until he can fill in January when out of Coverage Gap.   BPH: . Controlled; current regimen: tamsulosin 0.8 mg daily (2 0.4 mg capsules) . Recommended to continue current regimen at this time  Patient Goals/Self-Care Activities . Over the next 90 days, patient will:  - take medications as prescribed check blood glucose periodically, document, and provide at future appointments  Follow Up Plan: Telephone follow up appointment with care management team member scheduled for: ~ 8 weeks (~ 4 weeks after PCP visit)        Plan: Telephone follow up appointment with care management team member scheduled for: ~ 8 weeks  Catie Darnelle Maffucci, PharmD, [place], [place] Pharmacist [place] [phone]

## 2020-09-17 ENCOUNTER — Ambulatory Visit: Payer: Medicare Other | Admitting: Pharmacist

## 2020-09-17 DIAGNOSIS — I480 Paroxysmal atrial fibrillation: Secondary | ICD-10-CM

## 2020-09-17 DIAGNOSIS — E1159 Type 2 diabetes mellitus with other circulatory complications: Secondary | ICD-10-CM

## 2020-09-17 DIAGNOSIS — I10 Essential (primary) hypertension: Secondary | ICD-10-CM

## 2020-09-17 NOTE — Chronic Care Management (AMB) (Signed)
Chronic Care Management   Pharmacy Note  09/17/2020 Name: Zachary Santos MRN: [id number] DOB: 04-Dec-1935   Subjective:  Zachary Santos is a 84 y.o. year old male who is a primary care patient of Zachary Santos, Zachary Adam, MD. The CCM team was consulted for assistance with chronic disease management and care coordination needs.    Care coordination completed today in response to provider referral for pharmacy case management and/or care coordination services.   Consent to Services:  Zachary Santos was given information about Chronic Care Management services, agreed to services, and gave verbal consent prior to initiation of services on 09/10/20. Please see initial visit note for detailed documentation.   SDOH (Social Determinants of Health) assessments and interventions performed:  SDOH Interventions     Most Recent Value  SDOH Interventions  Financial Strain Interventions Other (Comment)  [samples obtained]       Objective:  Lab Results  Component Value Date   CREATININE 1.41 05/27/2020   CREATININE 1.46 (H) 05/16/2020   CREATININE 1.35 07/16/2019    Lab Results  Component Value Date   HGBA1C 9.6 (H) 05/27/2020       Component Value Date/Time   CHOL 131 05/27/2020 1324   TRIG 153.0 (H) 05/27/2020 1324   HDL 24.60 (L) 05/27/2020 1324   CHOLHDL 5 05/27/2020 1324   VLDL 30.6 05/27/2020 1324   LDLCALC 76 05/27/2020 1324     BP Readings from Last 3 Encounters:  08/18/20 140/70  06/30/20 110/60  05/29/20 110/61    Assessment/Interventions: Review of patient past medical history, allergies, medications, health status, including review of consultants reports, laboratory and other test data, was performed as part of comprehensive evaluation and provision of chronic care management services.    CHA2DS2-VASc Score = 4  This indicates a 4.8% annual risk of stroke. The patient's score is based upon: CHF History: 0 HTN History: 1 Diabetes History: 1 Stroke History: 0 Vascular  Disease History: 0 Age Score: 2 Gender Score: 0  No Known Allergies  Medications Reviewed Today    Reviewed by De Hollingshead, RPH-CPP (Pharmacist) on 09/10/20 at 57  Med List Status: <None>  Medication Order Taking? Sig Documenting Provider Last Dose Status Informant  amiodarone (PACERONE) 200 MG tablet [id number] Yes Take 1 tablet by mouth two times a day for one week, then take 1 tablet by mouth once daily. Kate Sable, MD Taking Active   apixaban (ELIQUIS) 5 MG TABS tablet [id number] Yes Take 1 tablet (5 mg total) by mouth 2 (two) times daily. Leone Haven, MD  Active   latanoprost (XALATAN) 0.005 % ophthalmic solution [id number] Yes INSTILL 1 DROP INTO EACH EYE AT BEDTIME [provider] Taking Active   lisinopril (ZESTRIL) 20 MG tablet [id number] Yes Take 1 tablet (20 mg total) by mouth daily. Leone Haven, MD Taking Active   metFORMIN (GLUCOPHAGE XR) 500 MG 24 hr tablet [id number]  Take 1 tablet (500 mg total) by mouth in the morning and at bedtime. Leone Haven, MD  Active   ONE TOUCH ULTRA TEST test strip [id number] Yes USE TEST STRIP TO CHECK GLUCOSE AT LEAST ONCE DAILY [name], DO Taking Active Self  rosuvastatin (CRESTOR) 20 MG tablet [id number] Yes Take 1 tablet by mouth once daily Leone Haven, MD Taking Active   tamsulosin [facility]) 0.4 MG CAPS capsule [id number] Yes Take 2 capsules by mouth once daily Leone Haven, MD Taking Active   vitamin B-12 (CYANOCOBALAMIN) 500 MCG tablet [id number]  Yes Take 500 mcg by mouth daily. [provider] Taking Active           Patient Active Problem List   Diagnosis Date Noted  . Musculoskeletal chest pain 08/18/2020  . Atherosclerosis 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 ([place]) 06/06/2020  . A-fib ([place]) 05/27/2020  . Blue toes 05/27/2020  . Insomnia 11/23/2019  . Anxiety and depression 07/17/2019  . Erectile dysfunction 08/29/2018  . Elevated alkaline phosphatase level  08/02/2017  . Fatigue 05/02/2017  . Dyslipidemia 11/01/2016  . Right leg claudication ([place]) 06/20/2015  . CKD (chronic kidney disease), stage III ([place])   . DM type 2 (diabetes mellitus, type 2) ([place]) 09/12/2013  . Osteoarthritis of right knee 06/07/2013  . BPH (benign prostatic hyperplasia) 02/02/2010  . Essential hypertension, benign 02/05/2008  . COPD (chronic obstructive pulmonary disease) ([place]) 02/05/2008  . Paget's disease of bone 02/05/2008    Medication Assistance: None required. Patient affirms current coverage meets needs.   Patient Care Plan: Medication Management    Problem Identified: Diabetes, Atrial Fibrillation     Long-Range Goal: Disease Progression Prevention   Note:   Current Barriers:  . Unable to independently afford treatment regimen . Unable to achieve control of diabetes   Pharmacist Clinical Goal(s):  [name] Over the next 90 days, patient will verbalize ability to afford treatment regimen. . Over the next 90 days, patient will achieve control of diabetes as evidenced by improvement in A1c  Interventions: . Inter-disciplinary care team collaboration (see longitudinal plan of care) . Comprehensive medication review performed; medication list updated in electronic medical record  Medication Management: . Provided pill box at last visit. Patient confirms that he is using  Diabetes: . Uncontrolled; current treatment: metformin XR 500 mg BID . Continue current regimen with blood sugar checks. Patient will go by the pharmacy to pick up the new glucometer  Hyperlipidemia: . Uncontrolled; current treatment: atorvastatin 20 mg daily  . Recommended to continue current regimen at this time.  May consider dose titration moving forward.   Atrial Fibrillation: . Appropriately managed; current rhythm control: amiodarone 200 mg daily; anticoagulant treatment: Eliquis 5 mg BID . Hypertension - lisinopril 20 mg daily . Eliquis samples obtained. Patient notified.    BPH: . Controlled; current regimen: tamsulosin 0.8 mg daily (2 0.4 mg capsules) . Recommended to continue current regimen at this time  Patient Goals/Self-Care Activities . Over the next 90 days, patient will:  - take medications as prescribed check blood glucose periodically, document, and provide at future appointments  Follow Up Plan: Telephone follow up appointment with care management team member scheduled for: ~ 7 weeks (~ 4 weeks after PCP visit)        Plan: Telephone follow up appointment with care management team member scheduled for: ~ 7 weeks  Catie Darnelle Maffucci, PharmD, [place], CPP Clinical Pharmacist [place] [phone]   Medication Samples have been provided to the patient.  Drug name: Eliquis       Strength: 5 mg        Qty: 56  LOT: [id number]  Exp.Date: 04/2022

## 2020-09-17 NOTE — Patient Instructions (Signed)
Visit Information  Patient Care Plan: Medication Management    Problem Identified: Diabetes, Atrial Fibrillation     Long-Range Goal: Disease Progression Prevention   Note:   Current Barriers:  . Unable to independently afford treatment regimen . Unable to achieve control of diabetes   Pharmacist Clinical Goal(s):  [name] Over the next 90 days, patient will verbalize ability to afford treatment regimen. . Over the next 90 days, patient will achieve control of diabetes as evidenced by improvement in A1c  Interventions: . Inter-disciplinary care team collaboration (see longitudinal plan of care) . Comprehensive medication review performed; medication list updated in electronic medical record  Medication Management: . Provided pill box at last visit. Patient confirms that he is using  Diabetes: . Uncontrolled; current treatment: metformin XR 500 mg BID . Continue current regimen with blood sugar checks. Patient will go by the pharmacy to pick up the new glucometer  Hyperlipidemia: . Uncontrolled; current treatment: atorvastatin 20 mg daily  . Recommended to continue current regimen at this time.  May consider dose titration moving forward.   Atrial Fibrillation: . Appropriately managed; current rhythm control: amiodarone 200 mg daily; anticoagulant treatment: Eliquis 5 mg BID . Hypertension - lisinopril 20 mg daily . Eliquis samples obtained. Patient notified.   BPH: . Controlled; current regimen: tamsulosin 0.8 mg daily (2 0.4 mg capsules) . Recommended to continue current regimen at this time  Patient Goals/Self-Care Activities . Over the next 90 days, patient will:  - take medications as prescribed check blood glucose periodically, document, and provide at future appointments  Follow Up Plan: Telephone follow up appointment with care management team member scheduled for: ~ 7 weeks (~ 4 weeks after PCP visit)       The patient verbalized understanding of instructions,  educational materials, and care plan provided today and declined offer to receive copy of patient instructions, educational materials, and care plan.    Plan: Telephone follow up appointment with care management team member scheduled for: ~ 7 weeks  Catie Darnelle Maffucci, PharmD, [place], [place] Pharmacist [place] [phone]

## 2020-09-29 ENCOUNTER — Ambulatory Visit: Payer: Medicare Other | Admitting: Family Medicine

## 2020-09-29 DIAGNOSIS — Z0289 Encounter for other administrative examinations: Secondary | ICD-10-CM

## 2020-10-02 ENCOUNTER — Telehealth: Payer: Self-pay | Admitting: Family Medicine

## 2020-10-02 NOTE — Telephone Encounter (Signed)
Patient stated he made a pineapple cake and didn't store properly and now he stated he is vomiting, nauseated, BM urgency, and gassy. No fever, chills, blood in stool, and coughing. Patient is not having any BM for the past couple days. He is wanting an enema or a laxative. He feels like he is filling up. He is eating a [employer]. Instructed patient he would need to be evaluated to receive medication. Due to no available appointments I instructed patient to go to UC. He stated he would go to [facility].

## 2020-10-02 NOTE — Telephone Encounter (Signed)
Pt thinks he has food poising and bowels locking up

## 2020-10-03 NOTE — Telephone Encounter (Signed)
Noted. Agree with need for evaluation. Please follow-up with him on Monday to ensure he was evaluated. Thanks.

## 2020-10-03 NOTE — Telephone Encounter (Signed)
Patient stated he was too sick to leave the house yesterday. I instructed him he really needs to be evaluated. He refused to call 911 to go to ED. He stated he will try and go to UC today, he feels too bad. I told him if he would like I could call EMS, he refused. He did not want ems to come and didn't want to go to ED.

## 2020-10-03 NOTE — Telephone Encounter (Signed)
Please follow-up with the patient to make sure he was evaluated.

## 2020-10-04 ENCOUNTER — Other Ambulatory Visit: Payer: Self-pay

## 2020-10-04 ENCOUNTER — Emergency Department: Payer: Medicare Other

## 2020-10-04 ENCOUNTER — Emergency Department
Admission: EM | Admit: 2020-10-04 | Discharge: 2020-10-04 | Disposition: A | Discharge status: Home / Self Care | Payer: Medicare Other | Source: Home / Self Care | Attending: Emergency Medicine | Admitting: Emergency Medicine

## 2020-10-04 DIAGNOSIS — E1165 Type 2 diabetes mellitus with hyperglycemia: Secondary | ICD-10-CM | POA: Diagnosis not present

## 2020-10-04 DIAGNOSIS — E1122 Type 2 diabetes mellitus with diabetic chronic kidney disease: Secondary | ICD-10-CM | POA: Insufficient documentation

## 2020-10-04 DIAGNOSIS — R55 Syncope and collapse: Secondary | ICD-10-CM

## 2020-10-04 DIAGNOSIS — R0602 Shortness of breath: Secondary | ICD-10-CM | POA: Insufficient documentation

## 2020-10-04 DIAGNOSIS — I495 Sick sinus syndrome: Secondary | ICD-10-CM | POA: Diagnosis not present

## 2020-10-04 DIAGNOSIS — J449 Chronic obstructive pulmonary disease, unspecified: Secondary | ICD-10-CM | POA: Diagnosis not present

## 2020-10-04 DIAGNOSIS — I499 Cardiac arrhythmia, unspecified: Secondary | ICD-10-CM | POA: Diagnosis not present

## 2020-10-04 DIAGNOSIS — J432 Centrilobular emphysema: Secondary | ICD-10-CM | POA: Diagnosis not present

## 2020-10-04 DIAGNOSIS — W19XXXA Unspecified fall, initial encounter: Secondary | ICD-10-CM

## 2020-10-04 DIAGNOSIS — S0990XA Unspecified injury of head, initial encounter: Secondary | ICD-10-CM | POA: Diagnosis not present

## 2020-10-04 DIAGNOSIS — J984 Other disorders of lung: Secondary | ICD-10-CM | POA: Diagnosis not present

## 2020-10-04 DIAGNOSIS — W07XXXA Fall from chair, initial encounter: Secondary | ICD-10-CM | POA: Insufficient documentation

## 2020-10-04 DIAGNOSIS — E1159 Type 2 diabetes mellitus with other circulatory complications: Secondary | ICD-10-CM | POA: Diagnosis not present

## 2020-10-04 DIAGNOSIS — E785 Hyperlipidemia, unspecified: Secondary | ICD-10-CM | POA: Diagnosis not present

## 2020-10-04 DIAGNOSIS — R6889 Other general symptoms and signs: Secondary | ICD-10-CM | POA: Diagnosis not present

## 2020-10-04 DIAGNOSIS — Z743 Need for continuous supervision: Secondary | ICD-10-CM | POA: Diagnosis not present

## 2020-10-04 DIAGNOSIS — Z7901 Long term (current) use of anticoagulants: Secondary | ICD-10-CM | POA: Insufficient documentation

## 2020-10-04 DIAGNOSIS — I129 Hypertensive chronic kidney disease with stage 1 through stage 4 chronic kidney disease, or unspecified chronic kidney disease: Secondary | ICD-10-CM | POA: Insufficient documentation

## 2020-10-04 DIAGNOSIS — M25511 Pain in right shoulder: Secondary | ICD-10-CM | POA: Insufficient documentation

## 2020-10-04 DIAGNOSIS — Z961 Presence of intraocular lens: Secondary | ICD-10-CM | POA: Diagnosis not present

## 2020-10-04 DIAGNOSIS — Z7984 Long term (current) use of oral hypoglycemic drugs: Secondary | ICD-10-CM | POA: Diagnosis not present

## 2020-10-04 DIAGNOSIS — I442 Atrioventricular block, complete: Secondary | ICD-10-CM | POA: Diagnosis not present

## 2020-10-04 DIAGNOSIS — Z79899 Other long term (current) drug therapy: Secondary | ICD-10-CM | POA: Diagnosis not present

## 2020-10-04 DIAGNOSIS — Z833 Family history of diabetes mellitus: Secondary | ICD-10-CM | POA: Diagnosis not present

## 2020-10-04 DIAGNOSIS — R404 Transient alteration of awareness: Secondary | ICD-10-CM | POA: Diagnosis not present

## 2020-10-04 DIAGNOSIS — Y92009 Unspecified place in unspecified non-institutional (private) residence as the place of occurrence of the external cause: Secondary | ICD-10-CM | POA: Insufficient documentation

## 2020-10-04 DIAGNOSIS — I10 Essential (primary) hypertension: Secondary | ICD-10-CM | POA: Diagnosis not present

## 2020-10-04 DIAGNOSIS — Z9049 Acquired absence of other specified parts of digestive tract: Secondary | ICD-10-CM | POA: Diagnosis not present

## 2020-10-04 DIAGNOSIS — M1711 Unilateral primary osteoarthritis, right knee: Secondary | ICD-10-CM | POA: Diagnosis not present

## 2020-10-04 DIAGNOSIS — F32A Depression, unspecified: Secondary | ICD-10-CM | POA: Diagnosis not present

## 2020-10-04 DIAGNOSIS — R531 Weakness: Secondary | ICD-10-CM | POA: Diagnosis not present

## 2020-10-04 DIAGNOSIS — R110 Nausea: Secondary | ICD-10-CM | POA: Diagnosis not present

## 2020-10-04 DIAGNOSIS — F17210 Nicotine dependence, cigarettes, uncomplicated: Secondary | ICD-10-CM | POA: Diagnosis not present

## 2020-10-04 DIAGNOSIS — Z9842 Cataract extraction status, left eye: Secondary | ICD-10-CM | POA: Diagnosis not present

## 2020-10-04 DIAGNOSIS — M542 Cervicalgia: Secondary | ICD-10-CM | POA: Diagnosis not present

## 2020-10-04 DIAGNOSIS — N1831 Chronic kidney disease, stage 3a: Secondary | ICD-10-CM | POA: Diagnosis not present

## 2020-10-04 DIAGNOSIS — G9389 Other specified disorders of brain: Secondary | ICD-10-CM | POA: Diagnosis not present

## 2020-10-04 DIAGNOSIS — S51811A Laceration without foreign body of right forearm, initial encounter: Secondary | ICD-10-CM | POA: Diagnosis not present

## 2020-10-04 DIAGNOSIS — J439 Emphysema, unspecified: Secondary | ICD-10-CM | POA: Diagnosis not present

## 2020-10-04 DIAGNOSIS — I480 Paroxysmal atrial fibrillation: Secondary | ICD-10-CM | POA: Diagnosis not present

## 2020-10-04 DIAGNOSIS — Z9841 Cataract extraction status, right eye: Secondary | ICD-10-CM | POA: Diagnosis not present

## 2020-10-04 DIAGNOSIS — S199XXA Unspecified injury of neck, initial encounter: Secondary | ICD-10-CM | POA: Diagnosis not present

## 2020-10-04 DIAGNOSIS — N183 Chronic kidney disease, stage 3 unspecified: Secondary | ICD-10-CM | POA: Insufficient documentation

## 2020-10-04 LAB — BASIC METABOLIC PANEL
Anion gap: 9 (ref 5–15)
BUN: 15 mg/dL (ref 8–23)
CO2: 28 mmol/L (ref 22–32)
Calcium: 8.8 mg/dL — ABNORMAL LOW (ref 8.9–10.3)
Chloride: 99 mmol/L (ref 98–111)
Creatinine, Ser: 1.34 mg/dL — ABNORMAL HIGH (ref 0.61–1.24)
GFR, Estimated: 53 mL/min — ABNORMAL LOW (ref >60)
Glucose, Bld: 254 mg/dL — ABNORMAL HIGH (ref 70–99)
Potassium: 3.6 mmol/L (ref 3.5–5.1)
Sodium: 136 mmol/L (ref 135–145)

## 2020-10-04 LAB — CBC
HCT: 41.2 % (ref 39.0–52.0)
Hemoglobin: 13.8 g/dL (ref 13.0–17.0)
MCH: 30.8 pg (ref 26.0–34.0)
MCHC: 33.5 g/dL (ref 30.0–36.0)
MCV: 92 fL (ref 80.0–100.0)
Platelets: 144 10*3/uL — ABNORMAL LOW (ref 150–400)
RBC: 4.48 MIL/uL (ref 4.22–5.81)
RDW: 13 % (ref 11.5–15.5)
WBC: 7.4 10*3/uL (ref 4.0–10.5)
nRBC: 0 % (ref 0.0–0.2)

## 2020-10-04 LAB — URINALYSIS, COMPLETE (UACMP) WITH MICROSCOPIC
Bacteria, UA: NONE SEEN
Bilirubin Urine: NEGATIVE
Glucose, UA: >=500 mg/dL — AB
Ketones, ur: NEGATIVE mg/dL
Nitrite: NEGATIVE
Protein, ur: NEGATIVE mg/dL
Specific Gravity, Urine: 1.006 (ref 1.005–1.030)
Squamous Epithelial / HPF: NONE SEEN (ref 0–5)
pH: 8 (ref 5.0–8.0)

## 2020-10-04 LAB — TROPONIN I (HIGH SENSITIVITY): Troponin I (High Sensitivity): 14 ng/L (ref <18)

## 2020-10-04 MED ORDER — AMIODARONE HCL 200 MG PO TABS
200.0000 mg | ORAL_TABLET | Freq: Every day | ORAL | Status: DC
  Administered 2020-10-04: 200 mg via ORAL
  Filled 2020-10-04 (×2): qty 1

## 2020-10-04 MED ORDER — LISINOPRIL 10 MG PO TABS
20.0000 mg | ORAL_TABLET | Freq: Once | ORAL | Status: AC
  Administered 2020-10-04: 20 mg via ORAL
  Filled 2020-10-04: qty 2

## 2020-10-04 MED ORDER — SODIUM CHLORIDE 0.9 % IV BOLUS: 1000.0000 mL | Freq: Once | INTRAVENOUS | Status: DC

## 2020-10-04 NOTE — ED Triage Notes (Addendum)
Pt comes via EMS with c/o syncopal episode. Pt fell and hit his head and right side of shoulder. Pt states severe weakness and some SOB. Pt is on two types of blood thinners.  CBG 302 per EMS. Pt hypertensive at 204/77.  Pt has abrasions to right side of head and over eye. Pt has redness to right shoulder.

## 2020-10-04 NOTE — ED Provider Notes (Signed)
[facility] Emergency Department Provider Note  ____________________________________________   Event Date/Time   First MD Initiated Contact with Patient 10/04/20 1710     (approximate)  I have reviewed the triage vital signs and the nursing notes.   HISTORY  Chief Complaint Weakness and Shortness of Breath  HPI Zachary Santos is a 84 y.o. male presents to the ED via EMS from home.  Patient recalls getting lightheaded while sitting in a chair, and he apparently fell out of the chair hitting his head and abrading his right shoulder.  He describes some weakness and some shortness of breath but denies any outright chest pain.  Patient takes Eliquis for A. fib.  He also gives a history of some nausea, vomiting, and diarrhea earlier in a week after he ate some bad food.  Patient presents today for evaluation of his injuries, and admits he is not taking his daily meds at this time.  He denies any abdominal pain, chest pain, or leg weakness.     Past Medical History:  Diagnosis Date  . Anxiety   . BPH (benign prostatic hyperplasia)   . Chronic fatigue   . CKD (chronic kidney disease), stage III ([place])   . Claudication [facility])    a. R Hip.  [name] COPD (chronic obstructive pulmonary disease) ([place])   . Dyspnea    with exertion  . Essential hypertension    a. 01/2008 Echo: EF 65%, no rwma, mod increased PASP.  [name] Hypercholesterolemia   . Osteoarthritis   . Paget's disease   . Syncope    a. 03/2015 in setting of hypotension.  . Tobacco abuse    a. 1-1.5 ppd x 60+ yrs.  . Type II diabetes mellitus ([place])   . Wears partial dentures    uppers    Patient Active Problem List   Diagnosis Date Noted  . Musculoskeletal chest pain 08/18/2020  . Atherosclerosis 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 ([place]) 06/06/2020  . A-fib ([place]) 05/27/2020  . Blue toes 05/27/2020  . Insomnia 11/23/2019  . Anxiety and depression 07/17/2019  . Erectile dysfunction 08/29/2018  .  Elevated alkaline phosphatase level 08/02/2017  . Fatigue 05/02/2017  . Dyslipidemia 11/01/2016  . Right leg claudication ([place]) 06/20/2015  . CKD (chronic kidney disease), stage III ([place])   . DM type 2 (diabetes mellitus, type 2) ([place]) 09/12/2013  . Osteoarthritis of right knee 06/07/2013  . BPH (benign prostatic hyperplasia) 02/02/2010  . Essential hypertension, benign 02/05/2008  . COPD (chronic obstructive pulmonary disease) ([place]) 02/05/2008  . Paget's disease of bone 02/05/2008    Past Surgical History:  Procedure Laterality Date  . APPENDECTOMY    . CATARACT EXTRACTION W/PHACO Left 12/11/2019   Procedure: CATARACT EXTRACTION PHACO AND INTRAOCULAR LENS PLACEMENT ([place]) LEFT DIABETIC MALYUGIN;  Surgeon: Birder Robson, MD;  Location: [facility];  Service: Ophthalmology;  Laterality: Left;  CDE 11.45 U/S  1:05.7  . CATARACT EXTRACTION W/PHACO Right 01/08/2020   Procedure: CATARACT EXTRACTION PHACO AND INTRAOCULAR LENS PLACEMENT ([facility]) RIGHT DIABETIC 5.75  00:49.3;  Surgeon: Birder Robson, MD;  Location: [facility];  Service: Ophthalmology;  Laterality: Right;  Diabetic  . laparscopic chole    . SPINE SURGERY     cervical spine surgery  . trigeminal neuralgia     with surgery in [place]    Prior to Admission medications   Medication Sig Start Date End Date Taking? Authorizing Provider  amiodarone (PACERONE) 200 MG tablet Take 1  tablet by mouth two times a day for one week, then take 1 tablet by mouth once daily. 06/30/20   Kate Sable, MD  apixaban (ELIQUIS) 5 MG TABS tablet Take 1 tablet (5 mg total) by mouth 2 (two) times daily. 09/10/20   Leone Haven, MD  blood glucose meter kit and supplies KIT Dispense based on patient and insurance preference. Use up to four times daily as directed. (FOR ICD-9 250.00, 250.01). 09/16/20   Leone Haven, MD  latanoprost (XALATAN) 0.005 % ophthalmic solution INSTILL 1 DROP INTO EACH EYE AT BEDTIME  07/18/19   [provider]  lisinopril (ZESTRIL) 20 MG tablet Take 1 tablet (20 mg total) by mouth daily. 09/10/20   Leone Haven, MD  metFORMIN (GLUCOPHAGE XR) 500 MG 24 hr tablet Take 1 tablet (500 mg total) by mouth in the morning and at bedtime. 09/10/20   Leone Haven, MD  ONE TOUCH ULTRA TEST test strip USE TEST STRIP TO CHECK GLUCOSE AT LEAST ONCE DAILY 05/06/16   [name], DO  rosuvastatin (CRESTOR) 20 MG tablet Take 1 tablet by mouth once daily 08/05/20   Leone Haven, MD  tamsulosin [facility]) 0.4 MG CAPS capsule Take 2 capsules by mouth once daily 12/05/19   Leone Haven, MD  vitamin B-12 (CYANOCOBALAMIN) 500 MCG tablet Take 500 mcg by mouth daily.    [provider]    Allergies Patient has no known allergies.  Family History  Problem Relation Age of Onset  . Cancer Mother        died @ 75.  [name] COPD Father        died @ 25.  . Diabetes Brother        died in early 99's.    Social History Social History   Tobacco Use  . Smoking status: Current Every Day Smoker    Packs/day: 1.50    Years: 60.00    Pack years: 90.00    Types: Cigarettes  . Smokeless tobacco: Current User  Substance Use Topics  . Alcohol use: Not Currently    Alcohol/week: 0.0 standard drinks    Comment: rare - "1% of the time."  . Drug use: No    Review of Systems  Constitutional: No fever/chills Eyes: No visual changes. ENT: No sore throat. Cardiovascular: Denies chest pain. Respiratory: Denies shortness of breath. Gastrointestinal: No abdominal pain.  No nausea, no vomiting.  No diarrhea.  No constipation. Genitourinary: Negative for dysuria. Musculoskeletal: Negative for back pain. Skin: Negative for rash. Facial abrasions Neurological: Negative for headaches, focal weakness or numbness. ____________________________________________   PHYSICAL EXAM:  VITAL SIGNS: ED Triage Vitals  Enc Vitals Group     BP 10/04/20 1622 (!) 204/77     Pulse  Rate 10/04/20 1622 (!) 52     Resp 10/04/20 1622 19     Temp 10/04/20 1622 (!) 97.4 F (36.3 C)     Temp src --      SpO2 10/04/20 1622 94 %     Weight 10/04/20 1625 176 lb (79.8 kg)     Height 10/04/20 1625 6' (1.829 m)     Head Circumference --      Peak Flow --      Pain Score 10/04/20 1622 4     Pain Loc --      Pain Edu? --      Excl. in [place]? --    Constitutional: Alert and oriented. Well appearing and in no  acute distress. Eyes: Conjunctivae are normal. PERRL. EOMI. Head: Atraumatic, except for an abrasion over the right brow. Mouth/Throat: Mucous membranes are moist.  Oropharynx non-erythematous. Neck: No stridor.  No cervical spine tenderness to palpation. Cardiovascular: Normal rate, regular rhythm. Grossly normal heart sounds.  Good peripheral circulation. Respiratory: Normal respiratory effort.  No retractions. Lungs CTAB. Gastrointestinal: Soft and nontender. No distention. No abdominal bruits. No CVA tenderness. Musculoskeletal: Right shoulder without obvious deformity, dislocation, or sulcus sign.  Patient with full active range of motion to the right shoulder.  No lower extremity tenderness nor edema.  No joint effusions. Neurologic:  Normal speech and language. No gross focal neurologic deficits are appreciated. No gait instability. Skin:  Skin is warm, dry and intact. No rash noted.  Superficial abrasion noted to the posterior right shoulder blade. Psychiatric: Mood and affect are normal. Speech and behavior are normal. ____________________________________________   LABS (all labs ordered are listed, but only abnormal results are displayed)  Labs Reviewed  BASIC METABOLIC PANEL - Abnormal; Notable for the following components:      Result Value   Glucose, Bld 254 (*)    Creatinine, Ser 1.34 (*)    Calcium 8.8 (*)    GFR, Estimated 53 (*)    All other components within normal limits  CBC - Abnormal; Notable for the following components:   Platelets 144 (*)     All other components within normal limits  URINALYSIS, COMPLETE (UACMP) WITH MICROSCOPIC - Abnormal; Notable for the following components:   Color, Urine STRAW (*)    APPearance CLEAR (*)    Glucose, UA >=500 (*)    Hgb urine dipstick SMALL (*)    Leukocytes,Ua TRACE (*)    Non Squamous Epithelial PRESENT (*)    All other components within normal limits  CBG MONITORING, ED  TROPONIN I (HIGH SENSITIVITY)  TROPONIN I (HIGH SENSITIVITY)  ____________________________________________  EKG  Sinus brady 51 ms PR Interval 242 ms QRS duration 94 ms 1st deg AV block No STEMI ____________________________________________  RADIOLOGY I, Melvenia Needles, personally viewed and evaluated these images (plain radiographs) as part of my medical decision making, as well as reviewing the written report by the radiologist.  ED MD interpretation:  No acute findings  Official radiology report(s): CT Head Wo Contrast  Result Date: 10/04/2020 CLINICAL DATA:  Syncope, fell and hit head, weakness, anticoagulated EXAM: CT HEAD WITHOUT CONTRAST TECHNIQUE: Contiguous axial images were obtained from the base of the skull through the vertex without intravenous contrast. COMPARISON:  07/21/2016 FINDINGS: Brain: Confluent hypodensities throughout the periventricular white matter are grossly stable, consistent with chronic small vessel ischemic changes. Stable mild encephalomalacia left cerebellar hemisphere, either postsurgical or related to chronic ischemic change. No evidence of acute infarct or hemorrhage. The lateral ventricles and remaining midline structures are unremarkable. No acute extra-axial fluid collections. No mass effect. Vascular: No hyperdense vessel or unexpected calcification. Skull: Stable changes from left occipital craniectomy. Remainder of the calvarium is unremarkable. Sinuses/Orbits: No acute finding. Other: None. IMPRESSION: 1. No acute intracranial process.  Grossly stable exam.  Electronically Signed   By: Randa Ngo M.D.   On: 10/04/2020 17:01  ____________________________________________   PROCEDURES  Procedure(s) performed (including Critical Care):  Procedures ___________________________________________   INITIAL IMPRESSION / ASSESSMENT AND PLAN / ED COURSE  As part of my medical decision making, I reviewed the following data within the [place]   DDX: SAH/SDH, hypo-/hyperglycemia, hypovolemia  Geriatric patient with ED evaluation of syncopal  episode resulting in a fall out of his chair.  Patient presents with an abrasion of the right brow, but denies any other acute injuries.  Also notes some mild right shoulder pain.  CT is negative for any acute intracranial process.  Exam reveals no rotator cuff deficit or gross deformity to the right shoulder.  Patient's blood pressure has been elevated during the course the ED but he denies any acute chest pain or shortness of breath.  Patient is to discharged to follow-up with primary provider for ongoing symptoms.  ____________________________________________   FINAL CLINICAL IMPRESSION(S) / ED DIAGNOSES  Final diagnoses:  Syncope, unspecified syncope type  Fall at home, initial encounter     ED Discharge Orders    None      *Please note:  Zachary Santos was evaluated in Emergency Department on 10/04/2020 for the symptoms described in the history of present illness. He was evaluated in the context of the global COVID-19 pandemic, which necessitated consideration that the patient might be at risk for infection with the SARS-CoV-2 virus that causes COVID-19. Institutional protocols and algorithms that pertain to the evaluation of patients at risk for COVID-19 are in a state of rapid change based on information released by regulatory bodies including the CDC and federal and state organizations. These policies and algorithms were followed during the patient's care in the ED.  Some ED  evaluations and interventions may be delayed as a result of limited staffing during and the pandemic.*   Note:  This document was prepared using Dragon voice recognition software and may include unintentional dictation errors.    Melvenia Needles, PA-C 10/04/20 2126    Nance Pear, MD 10/04/20 2239

## 2020-10-04 NOTE — Discharge Instructions (Addendum)
Your labs and CT scans are normal and reassuring. Take your home meds and follow-up with Dr. Raeanne Gathers for continued symptoms. Return to the ED if needed.

## 2020-10-04 NOTE — ED Notes (Signed)
E signature pad not working. Pt educated on discharge instructions and verbalized understanding.  

## 2020-10-06 ENCOUNTER — Telehealth: Payer: Self-pay | Admitting: Family Medicine

## 2020-10-06 ENCOUNTER — Encounter: Payer: Self-pay | Admitting: Emergency Medicine

## 2020-10-06 ENCOUNTER — Telehealth: Payer: Self-pay

## 2020-10-06 ENCOUNTER — Other Ambulatory Visit: Payer: Self-pay

## 2020-10-06 ENCOUNTER — Emergency Department: Payer: Medicare Other

## 2020-10-06 ENCOUNTER — Inpatient Hospital Stay
Admission: EM | Admit: 2020-10-06 | Discharge: 2020-10-08 | DRG: 244 | Disposition: A | Discharge status: Home / Self Care | Payer: Medicare Other | Attending: Internal Medicine | Admitting: Internal Medicine

## 2020-10-06 DIAGNOSIS — I480 Paroxysmal atrial fibrillation: Secondary | ICD-10-CM | POA: Diagnosis present

## 2020-10-06 DIAGNOSIS — Z9842 Cataract extraction status, left eye: Secondary | ICD-10-CM

## 2020-10-06 DIAGNOSIS — E785 Hyperlipidemia, unspecified: Secondary | ICD-10-CM | POA: Diagnosis present

## 2020-10-06 DIAGNOSIS — E1122 Type 2 diabetes mellitus with diabetic chronic kidney disease: Secondary | ICD-10-CM | POA: Diagnosis present

## 2020-10-06 DIAGNOSIS — I129 Hypertensive chronic kidney disease with stage 1 through stage 4 chronic kidney disease, or unspecified chronic kidney disease: Secondary | ICD-10-CM | POA: Diagnosis present

## 2020-10-06 DIAGNOSIS — J449 Chronic obstructive pulmonary disease, unspecified: Secondary | ICD-10-CM | POA: Diagnosis present

## 2020-10-06 DIAGNOSIS — Z20822 Contact with and (suspected) exposure to covid-19: Secondary | ICD-10-CM | POA: Diagnosis present

## 2020-10-06 DIAGNOSIS — Z9841 Cataract extraction status, right eye: Secondary | ICD-10-CM

## 2020-10-06 DIAGNOSIS — Z9049 Acquired absence of other specified parts of digestive tract: Secondary | ICD-10-CM

## 2020-10-06 DIAGNOSIS — Z961 Presence of intraocular lens: Secondary | ICD-10-CM | POA: Diagnosis present

## 2020-10-06 DIAGNOSIS — E1165 Type 2 diabetes mellitus with hyperglycemia: Secondary | ICD-10-CM

## 2020-10-06 DIAGNOSIS — Z79899 Other long term (current) drug therapy: Secondary | ICD-10-CM

## 2020-10-06 DIAGNOSIS — Z833 Family history of diabetes mellitus: Secondary | ICD-10-CM

## 2020-10-06 DIAGNOSIS — I442 Atrioventricular block, complete: Secondary | ICD-10-CM

## 2020-10-06 DIAGNOSIS — Z7901 Long term (current) use of anticoagulants: Secondary | ICD-10-CM

## 2020-10-06 DIAGNOSIS — Z7984 Long term (current) use of oral hypoglycemic drugs: Secondary | ICD-10-CM

## 2020-10-06 DIAGNOSIS — M542 Cervicalgia: Secondary | ICD-10-CM

## 2020-10-06 DIAGNOSIS — I482 Chronic atrial fibrillation, unspecified: Secondary | ICD-10-CM | POA: Diagnosis present

## 2020-10-06 DIAGNOSIS — N400 Enlarged prostate without lower urinary tract symptoms: Secondary | ICD-10-CM | POA: Diagnosis present

## 2020-10-06 DIAGNOSIS — N1831 Chronic kidney disease, stage 3a: Secondary | ICD-10-CM | POA: Diagnosis present

## 2020-10-06 DIAGNOSIS — N529 Male erectile dysfunction, unspecified: Secondary | ICD-10-CM | POA: Diagnosis present

## 2020-10-06 DIAGNOSIS — F32A Depression, unspecified: Secondary | ICD-10-CM | POA: Diagnosis present

## 2020-10-06 DIAGNOSIS — F17210 Nicotine dependence, cigarettes, uncomplicated: Secondary | ICD-10-CM | POA: Diagnosis present

## 2020-10-06 DIAGNOSIS — S51819A Laceration without foreign body of unspecified forearm, initial encounter: Secondary | ICD-10-CM

## 2020-10-06 DIAGNOSIS — W07XXXA Fall from chair, initial encounter: Secondary | ICD-10-CM | POA: Diagnosis present

## 2020-10-06 DIAGNOSIS — I495 Sick sinus syndrome: Secondary | ICD-10-CM | POA: Diagnosis present

## 2020-10-06 DIAGNOSIS — M1711 Unilateral primary osteoarthritis, right knee: Secondary | ICD-10-CM | POA: Diagnosis present

## 2020-10-06 DIAGNOSIS — R55 Syncope and collapse: Secondary | ICD-10-CM | POA: Diagnosis present

## 2020-10-06 DIAGNOSIS — Z950 Presence of cardiac pacemaker: Secondary | ICD-10-CM

## 2020-10-06 DIAGNOSIS — F419 Anxiety disorder, unspecified: Secondary | ICD-10-CM | POA: Diagnosis present

## 2020-10-06 LAB — CBC
HCT: 41.5 % (ref 39.0–52.0)
Hemoglobin: 14.2 g/dL (ref 13.0–17.0)
MCH: 31.2 pg (ref 26.0–34.0)
MCHC: 34.2 g/dL (ref 30.0–36.0)
MCV: 91.2 fL (ref 80.0–100.0)
Platelets: 155 10*3/uL (ref 150–400)
RBC: 4.55 MIL/uL (ref 4.22–5.81)
RDW: 12.9 % (ref 11.5–15.5)
WBC: 10 10*3/uL (ref 4.0–10.5)
nRBC: 0 % (ref 0.0–0.2)

## 2020-10-06 LAB — BASIC METABOLIC PANEL
Anion gap: 10 (ref 5–15)
BUN: 19 mg/dL (ref 8–23)
CO2: 27 mmol/L (ref 22–32)
Calcium: 9.5 mg/dL (ref 8.9–10.3)
Chloride: 100 mmol/L (ref 98–111)
Creatinine, Ser: 1.46 mg/dL — ABNORMAL HIGH (ref 0.61–1.24)
GFR, Estimated: 48 mL/min — ABNORMAL LOW (ref >60)
Glucose, Bld: 223 mg/dL — ABNORMAL HIGH (ref 70–99)
Potassium: 4.3 mmol/L (ref 3.5–5.1)
Sodium: 137 mmol/L (ref 135–145)

## 2020-10-06 NOTE — Telephone Encounter (Signed)
Patient called in wanted to know if he is suppose to keep taking all this medication  He went to the ED  On 12-18 they gave him some pills to bring his pressure down and he is failing down and weak

## 2020-10-06 NOTE — ED Notes (Signed)
Patient given a warm blanket at this time.  

## 2020-10-06 NOTE — Telephone Encounter (Signed)
Patient was taken to ED by EMS on the [id number]. Appears patient has passed out.

## 2020-10-06 NOTE — ED Notes (Signed)
c-collar applied  

## 2020-10-06 NOTE — Telephone Encounter (Signed)
Noted.  ED note reviewed.  Please make sure the patient has follow-up set up with me sometime in the next 1 to 2 weeks.  Thanks.

## 2020-10-06 NOTE — Telephone Encounter (Signed)
Spoken to patient, he stated that he is fainting while sitting in his chair. He has fainted two more times and has two more lacerations on on his eyebrow again and the other near his elbow. Patient has taken BP and he stated his BP was 156/86 and BPM was 32. He is feeling faint/dizzy at times.Patient believes he is taking too many medications. No sx or SOB, Coughing, fever, chills, blurry vision, heart palpitations, and jaw/arm px. Instructed patient to go back to ED. He stated he will have his son take him. He really does not wish to go back to ED, but he stated he would comply.

## 2020-10-06 NOTE — ED Notes (Signed)
Rainbow sent to the lab.  

## 2020-10-06 NOTE — ED Triage Notes (Signed)
Pt to ER with c/o passing out last night and hitting head.  Pt states was seen here Saturday for same. PT also reports nausea.

## 2020-10-06 NOTE — ED Provider Notes (Signed)
[facility] Emergency Department Provider Note ____________________________________________   Event Date/Time   First MD Initiated Contact with Patient 10/06/20 2158     (approximate)  I have reviewed the triage vital signs and the nursing notes.  HISTORY  Chief Complaint syncope   HPI Zachary Santos is a 84 y.o. malewho presents to the ED for evaluation of a syncopal episode.  Chart review indicates history of syncopal episodes.  Patient seen in our ED 2 days ago for the same.  Noting lightheaded dizziness while seated and causing syncopal episode.  Patient on Eliquis for A. fib.  He had a benign work-up and was discharged home.  Patient presents to the ED after another syncopal episode.  Patient reports a similar syndrome that is occurred in the past where he developed rapid onset lightheaded dizziness while seated and syncopized was.  Patient reports this happened about 24 hours ago, last night, reports feeling very weak throughout the day today and did not feel like coming in for evaluation.  He denies any additional syncopal episode that occurred today.  He reports right forearm pain and a skin tear, as well as neck pain.  He has been ambulatory today at his baseline, reports eating breakfast without abdominal pain or emesis.  Denies chest pain or palpitations.  Past Medical History:  Diagnosis Date  . Anxiety   . BPH (benign prostatic hyperplasia)   . Chronic fatigue   . CKD (chronic kidney disease), stage III ([place])   . Claudication [facility])    a. R Hip.  [name] COPD (chronic obstructive pulmonary disease) ([place])   . Dyspnea    with exertion  . Essential hypertension    a. 01/2008 Echo: EF 65%, no rwma, mod increased PASP.  [name] Hypercholesterolemia   . Osteoarthritis   . Paget's disease   . Syncope    a. 03/2015 in setting of hypotension.  . Tobacco abuse    a. 1-1.5 ppd x 60+ yrs.  . Type II diabetes mellitus ([place])   . Wears partial dentures    uppers     Patient Active Problem List   Diagnosis Date Noted  . Musculoskeletal chest pain 08/18/2020  . Atherosclerosis 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 ([place]) 06/06/2020  . A-fib ([place]) 05/27/2020  . Blue toes 05/27/2020  . Insomnia 11/23/2019  . Anxiety and depression 07/17/2019  . Erectile dysfunction 08/29/2018  . Elevated alkaline phosphatase level 08/02/2017  . Fatigue 05/02/2017  . Dyslipidemia 11/01/2016  . Right leg claudication ([place]) 06/20/2015  . CKD (chronic kidney disease), stage III ([place])   . DM type 2 (diabetes mellitus, type 2) ([place]) 09/12/2013  . Osteoarthritis of right knee 06/07/2013  . BPH (benign prostatic hyperplasia) 02/02/2010  . Essential hypertension, benign 02/05/2008  . COPD (chronic obstructive pulmonary disease) ([place]) 02/05/2008  . Paget's disease of bone 02/05/2008    Past Surgical History:  Procedure Laterality Date  . APPENDECTOMY    . CATARACT EXTRACTION W/PHACO Left 12/11/2019   Procedure: CATARACT EXTRACTION PHACO AND INTRAOCULAR LENS PLACEMENT ([place]) LEFT DIABETIC MALYUGIN;  Surgeon: Birder Robson, MD;  Location: [place];  Service: Ophthalmology;  Laterality: Left;  CDE 11.45 U/S  1:05.7  . CATARACT EXTRACTION W/PHACO Right 01/08/2020   Procedure: CATARACT EXTRACTION PHACO AND INTRAOCULAR LENS PLACEMENT ([facility]) RIGHT DIABETIC 5.75  00:49.3;  Surgeon: Birder Robson, MD;  Location: [place];  Service: Ophthalmology;  Laterality: Right;  Diabetic  . laparscopic chole    .  SPINE SURGERY     cervical spine surgery  . trigeminal neuralgia     with surgery in [place]    Prior to Admission medications   Medication Sig Start Date End Date Taking? Authorizing Provider  amiodarone (PACERONE) 200 MG tablet Take 1 tablet by mouth two times a day for one week, then take 1 tablet by mouth once daily. 06/30/20  Yes Agbor-Etang, Aaron Edelman, MD  apixaban (ELIQUIS) 5 MG TABS tablet Take 1 tablet (5 mg total) by mouth 2 (two)  times daily. 09/10/20  Yes Leone Haven, MD  blood glucose meter kit and supplies KIT Dispense based on patient and insurance preference. Use up to four times daily as directed. (FOR ICD-9 250.00, 250.01). 09/16/20  Yes Leone Haven, MD  latanoprost (XALATAN) 0.005 % ophthalmic solution INSTILL 1 DROP INTO EACH EYE AT BEDTIME 07/18/19  Yes [provider]  lisinopril (ZESTRIL) 20 MG tablet Take 1 tablet (20 mg total) by mouth daily. 09/10/20  Yes Leone Haven, MD  metFORMIN (GLUCOPHAGE XR) 500 MG 24 hr tablet Take 1 tablet (500 mg total) by mouth in the morning and at bedtime. 09/10/20  Yes Leone Haven, MD  ONE TOUCH ULTRA TEST test strip USE TEST STRIP TO CHECK GLUCOSE AT LEAST ONCE DAILY 05/06/16  Yes Thersa Salt G, DO  rosuvastatin (CRESTOR) 20 MG tablet Take 1 tablet by mouth once daily 08/05/20  Yes Leone Haven, MD  tamsulosin [facility]) 0.4 MG CAPS capsule Take 2 capsules by mouth once daily 12/05/19  Yes Leone Haven, MD  vitamin B-12 (CYANOCOBALAMIN) 500 MCG tablet Take 500 mcg by mouth daily.   Yes [provider]    Allergies Patient has no known allergies.  Family History  Problem Relation Age of Onset  . Cancer Mother        died @ 21.  [name] COPD Father        died @ 40.  . Diabetes Brother        died in early 64's.    Social History Social History   Tobacco Use  . Smoking status: Current Every Day Smoker    Packs/day: 1.50    Years: 60.00    Pack years: 90.00    Types: Cigarettes  . Smokeless tobacco: Current User  Substance Use Topics  . Alcohol use: Not Currently    Alcohol/week: 0.0 standard drinks    Comment: rare - "1% of the time."  . Drug use: No    Review of Systems  Constitutional: No fever/chills Eyes: No visual changes. ENT: No sore throat. Cardiovascular: Denies chest pain. Respiratory: Denies shortness of breath. Gastrointestinal: No abdominal pain.  No nausea, no vomiting.  No diarrhea.  No  constipation. Genitourinary: Negative for dysuria. Musculoskeletal: Negative for back pain. Positive for head and neck pain.  Positive for right forearm skin tear and pain. Skin: Negative for rash. Neurological: Negative for  focal weakness or numbness.  Positive for syncopal episode.   ____________________________________________   PHYSICAL EXAM:  VITAL SIGNS: Vitals:   10/06/20 1837 10/06/20 2001  BP: (!) 177/87 112/82  Pulse: (!) 55 (!) 58  Resp:  18  Temp: 98 F (36.7 C)   SpO2: 98% 99%     Constitutional: Alert and oriented. Well appearing and in no acute distress.  Sitting upright in the hallway bed.  Conversational in full sentences. Eyes: Conjunctivae are normal. PERRL. EOMI. Head: Multiple superficial abrasions and bruises to his left frontal and  right frontal forehead. Nose: No congestion/rhinnorhea. Mouth/Throat: Mucous membranes are moist.  Oropharynx non-erythematous. Neck: No stridor.  Left-sided paraspinal and midline cervical tenderness to palpation without bony step-offs or skin changes. Cardiovascular: Normal rate, regular rhythm. Grossly normal heart sounds.  Good peripheral circulation. Respiratory: Normal respiratory effort.  No retractions. Lungs CTAB. Gastrointestinal: Soft , nondistended, nontender to palpation. No CVA tenderness. Musculoskeletal:  No joint effusions.  Acute skin tear to his right dorsal forearm without bony step-offs, discrete lacerations or bony tenderness.  Right arm is distally neurovascularly intact. No spinal step-offs  Neurologic:  Normal speech and language. No gross focal neurologic deficits are appreciated.  Skin:  Skin is warm, dry and intact. No rash noted. Psychiatric: Mood and affect are normal. Speech and behavior are normal.  ____________________________________________   LABS (all labs ordered are listed, but only abnormal results are displayed)  Labs Reviewed  BASIC METABOLIC PANEL - Abnormal; Notable for the  following components:      Result Value   Glucose, Bld 223 (*)    Creatinine, Ser 1.46 (*)    GFR, Estimated 48 (*)    All other components within normal limits  CBC  URINALYSIS, COMPLETE (UACMP) WITH MICROSCOPIC  CBG MONITORING, ED   ____________________________________________  12 Lead EKG  Sinus rhythm, rate of 55 bpm.  Normal axis.  First-degree AV block with PR interval of 222 ms, otherwise normal intervals.  No evidence of acute ischemia. ____________________________________________  RADIOLOGY  ED MD interpretation: CT head and neck pending at the time of signout to oncoming provider  Official radiology report(s): No results found.  ____________________________________________   PROCEDURES and INTERVENTIONS  Procedure(s) performed (including Critical Care):  Procedures  Medications - No data to display  ____________________________________________   MDM / ED COURSE   84 year old male on Eliquis with history of syncopal episodes, presents after another syncopal episode with neck pain necessitating CT imaging.  Normal vitals on room air.  Exam with significant tenderness to his cervical spine and left-sided paraspinal neck, as well as a small superficial skin tear to his right dorsal forearm.  He has no evidence of neurovascular deficits or distress.  Blood work is unremarkable.  EKG is nonischemic.  CT imaging pending at the time of signout to oncoming provider.  Patient indicating that he does not want observation admission when I first talked to him.  Disposition pending CT scans and reevaluation by oncoming physician.     ____________________________________________   FINAL CLINICAL IMPRESSION(S) / ED DIAGNOSES  Final diagnoses:  Syncope, unspecified syncope type  Skin tear of forearm without complication, initial encounter  Neck pain     ED Discharge Orders    None       Anette Barra   Note:  This document was prepared using Dragon voice  recognition software and may include unintentional dictation errors.   Vladimir Crofts, MD 10/06/20 [phone]

## 2020-10-06 NOTE — Telephone Encounter (Signed)
Left message for patient to return call back. To schedule Follow Up ED 1-2 weeks from today.

## 2020-10-07 ENCOUNTER — Other Ambulatory Visit: Payer: Self-pay

## 2020-10-07 ENCOUNTER — Inpatient Hospital Stay (HOSPITAL_COMMUNITY)
Admit: 2020-10-07 | Discharge: 2020-10-07 | Disposition: A | Discharge status: Home / Self Care | Payer: Medicare Other | Attending: Family Medicine | Admitting: Family Medicine

## 2020-10-07 ENCOUNTER — Encounter: Payer: Self-pay | Admitting: Family Medicine

## 2020-10-07 DIAGNOSIS — N529 Male erectile dysfunction, unspecified: Secondary | ICD-10-CM | POA: Diagnosis present

## 2020-10-07 DIAGNOSIS — W07XXXA Fall from chair, initial encounter: Secondary | ICD-10-CM | POA: Diagnosis present

## 2020-10-07 DIAGNOSIS — I442 Atrioventricular block, complete: Secondary | ICD-10-CM | POA: Diagnosis not present

## 2020-10-07 DIAGNOSIS — N1831 Chronic kidney disease, stage 3a: Secondary | ICD-10-CM | POA: Diagnosis not present

## 2020-10-07 DIAGNOSIS — J432 Centrilobular emphysema: Secondary | ICD-10-CM | POA: Diagnosis not present

## 2020-10-07 DIAGNOSIS — F419 Anxiety disorder, unspecified: Secondary | ICD-10-CM | POA: Diagnosis present

## 2020-10-07 DIAGNOSIS — Z833 Family history of diabetes mellitus: Secondary | ICD-10-CM | POA: Diagnosis not present

## 2020-10-07 DIAGNOSIS — Z20822 Contact with and (suspected) exposure to covid-19: Secondary | ICD-10-CM | POA: Diagnosis present

## 2020-10-07 DIAGNOSIS — F32A Depression, unspecified: Secondary | ICD-10-CM | POA: Diagnosis present

## 2020-10-07 DIAGNOSIS — Z9841 Cataract extraction status, right eye: Secondary | ICD-10-CM | POA: Diagnosis not present

## 2020-10-07 DIAGNOSIS — J449 Chronic obstructive pulmonary disease, unspecified: Secondary | ICD-10-CM | POA: Diagnosis present

## 2020-10-07 DIAGNOSIS — R55 Syncope and collapse: Secondary | ICD-10-CM | POA: Diagnosis not present

## 2020-10-07 DIAGNOSIS — Z7984 Long term (current) use of oral hypoglycemic drugs: Secondary | ICD-10-CM | POA: Diagnosis not present

## 2020-10-07 DIAGNOSIS — I10 Essential (primary) hypertension: Secondary | ICD-10-CM

## 2020-10-07 DIAGNOSIS — E785 Hyperlipidemia, unspecified: Secondary | ICD-10-CM | POA: Diagnosis not present

## 2020-10-07 DIAGNOSIS — I480 Paroxysmal atrial fibrillation: Secondary | ICD-10-CM | POA: Diagnosis not present

## 2020-10-07 DIAGNOSIS — Z79899 Other long term (current) drug therapy: Secondary | ICD-10-CM | POA: Diagnosis not present

## 2020-10-07 DIAGNOSIS — I495 Sick sinus syndrome: Secondary | ICD-10-CM | POA: Diagnosis not present

## 2020-10-07 DIAGNOSIS — Z961 Presence of intraocular lens: Secondary | ICD-10-CM | POA: Diagnosis present

## 2020-10-07 DIAGNOSIS — Z7901 Long term (current) use of anticoagulants: Secondary | ICD-10-CM | POA: Diagnosis not present

## 2020-10-07 DIAGNOSIS — Z9842 Cataract extraction status, left eye: Secondary | ICD-10-CM | POA: Diagnosis not present

## 2020-10-07 DIAGNOSIS — M1711 Unilateral primary osteoarthritis, right knee: Secondary | ICD-10-CM | POA: Diagnosis present

## 2020-10-07 DIAGNOSIS — Z9049 Acquired absence of other specified parts of digestive tract: Secondary | ICD-10-CM | POA: Diagnosis not present

## 2020-10-07 DIAGNOSIS — I129 Hypertensive chronic kidney disease with stage 1 through stage 4 chronic kidney disease, or unspecified chronic kidney disease: Secondary | ICD-10-CM | POA: Diagnosis present

## 2020-10-07 DIAGNOSIS — E1165 Type 2 diabetes mellitus with hyperglycemia: Secondary | ICD-10-CM | POA: Diagnosis present

## 2020-10-07 DIAGNOSIS — E1159 Type 2 diabetes mellitus with other circulatory complications: Secondary | ICD-10-CM

## 2020-10-07 DIAGNOSIS — N400 Enlarged prostate without lower urinary tract symptoms: Secondary | ICD-10-CM | POA: Diagnosis present

## 2020-10-07 DIAGNOSIS — Z950 Presence of cardiac pacemaker: Secondary | ICD-10-CM | POA: Diagnosis not present

## 2020-10-07 DIAGNOSIS — F17210 Nicotine dependence, cigarettes, uncomplicated: Secondary | ICD-10-CM | POA: Diagnosis present

## 2020-10-07 DIAGNOSIS — E1122 Type 2 diabetes mellitus with diabetic chronic kidney disease: Secondary | ICD-10-CM | POA: Diagnosis present

## 2020-10-07 LAB — ECHOCARDIOGRAM COMPLETE
AR max vel: 1.87 cm2
AV Area VTI: 2.1 cm2
AV Area mean vel: 1.77 cm2
AV Mean grad: 7 mmHg
AV Peak grad: 12.3 mmHg
Ao pk vel: 1.75 m/s
Area-P 1/2: 2.82 cm2
Height: 72 in
S' Lateral: 2.75 cm
Weight: 2816 oz

## 2020-10-07 LAB — CBG MONITORING, ED
Glucose-Capillary: 142 mg/dL — ABNORMAL HIGH (ref 70–99)
Glucose-Capillary: 122 mg/dL — ABNORMAL HIGH (ref 70–99)
Glucose-Capillary: 120 mg/dL — ABNORMAL HIGH (ref 70–99)
Glucose-Capillary: 170 mg/dL — ABNORMAL HIGH (ref 70–99)
Glucose-Capillary: 197 mg/dL — ABNORMAL HIGH (ref 70–99)

## 2020-10-07 LAB — HEMOGLOBIN A1C
Hgb A1c MFr Bld: 8.6 % — ABNORMAL HIGH (ref 4.8–5.6)
Mean Plasma Glucose: 200.12 mg/dL

## 2020-10-07 LAB — TSH: TSH: 1.501 u[IU]/mL (ref 0.350–4.500)

## 2020-10-07 LAB — PROTIME-INR
INR: 1.6 — ABNORMAL HIGH (ref 0.8–1.2)
Prothrombin Time: 18.1 seconds — ABNORMAL HIGH (ref 11.4–15.2)

## 2020-10-07 LAB — BASIC METABOLIC PANEL
Anion gap: 9 (ref 5–15)
BUN: 20 mg/dL (ref 8–23)
CO2: 26 mmol/L (ref 22–32)
Calcium: 8.9 mg/dL (ref 8.9–10.3)
Chloride: 101 mmol/L (ref 98–111)
Creatinine, Ser: 1.49 mg/dL — ABNORMAL HIGH (ref 0.61–1.24)
GFR, Estimated: 47 mL/min — ABNORMAL LOW (ref >60)
Glucose, Bld: 168 mg/dL — ABNORMAL HIGH (ref 70–99)
Potassium: 3.8 mmol/L (ref 3.5–5.1)
Sodium: 136 mmol/L (ref 135–145)

## 2020-10-07 LAB — CBC
HCT: 38.1 % — ABNORMAL LOW (ref 39.0–52.0)
Hemoglobin: 12.8 g/dL — ABNORMAL LOW (ref 13.0–17.0)
MCH: 30.6 pg (ref 26.0–34.0)
MCHC: 33.6 g/dL (ref 30.0–36.0)
MCV: 91.1 fL (ref 80.0–100.0)
Platelets: 142 10*3/uL — ABNORMAL LOW (ref 150–400)
RBC: 4.18 MIL/uL — ABNORMAL LOW (ref 4.22–5.81)
RDW: 12.8 % (ref 11.5–15.5)
WBC: 8.1 10*3/uL (ref 4.0–10.5)
nRBC: 0 % (ref 0.0–0.2)

## 2020-10-07 LAB — RESP PANEL BY RT-PCR (FLU A&B, COVID) ARPGX2
Influenza A by PCR: NEGATIVE
Influenza B by PCR: NEGATIVE
SARS Coronavirus 2 by RT PCR: NEGATIVE

## 2020-10-07 LAB — APTT: aPTT: 52 seconds — ABNORMAL HIGH (ref 24–36)

## 2020-10-07 LAB — MAGNESIUM: Magnesium: 1.7 mg/dL (ref 1.7–2.4)

## 2020-10-07 MED ORDER — SODIUM CHLORIDE 0.9% FLUSH
3.0000 mL | Freq: Two times a day (BID) | INTRAVENOUS | Status: DC
  Administered 2020-10-07 (×2): 3 mL via INTRAVENOUS

## 2020-10-07 MED ORDER — ONDANSETRON HCL 4 MG/2ML IJ SOLN: 4.0000 mg | Freq: Four times a day (QID) | INTRAMUSCULAR | Status: DC | PRN

## 2020-10-07 MED ORDER — ACETAMINOPHEN 650 MG RE SUPP: 650.0000 mg | Freq: Four times a day (QID) | RECTAL | Status: DC | PRN

## 2020-10-07 MED ORDER — HYDRALAZINE HCL 20 MG/ML IJ SOLN
5.0000 mg | Freq: Four times a day (QID) | INTRAMUSCULAR | Status: DC | PRN
  Administered 2020-10-07: 5 mg via INTRAVENOUS
  Filled 2020-10-07: qty 1

## 2020-10-07 MED ORDER — ROSUVASTATIN CALCIUM 10 MG PO TABS
20.0000 mg | ORAL_TABLET | Freq: Every day | ORAL | Status: DC
  Administered 2020-10-07: 20 mg via ORAL
  Filled 2020-10-07: qty 1

## 2020-10-07 MED ORDER — SODIUM CHLORIDE 0.9 % IV SOLN: INTRAVENOUS | Status: DC

## 2020-10-07 MED ORDER — HEPARIN (PORCINE) 25000 UT/250ML-% IV SOLN
1100.0000 [IU]/h | INTRAVENOUS | Status: DC
  Administered 2020-10-07: 1100 [IU]/h via INTRAVENOUS
  Filled 2020-10-07: qty 250

## 2020-10-07 MED ORDER — SODIUM CHLORIDE 0.9% FLUSH: 3.0000 mL | INTRAVENOUS | Status: DC | PRN

## 2020-10-07 MED ORDER — ONDANSETRON HCL 4 MG PO TABS: 4.0000 mg | ORAL_TABLET | Freq: Four times a day (QID) | ORAL | Status: DC | PRN

## 2020-10-07 MED ORDER — ATROPINE SULFATE 1 MG/10ML IJ SOSY: 0.5000 mg | PREFILLED_SYRINGE | Freq: Once | INTRAMUSCULAR | Status: DC | PRN

## 2020-10-07 MED ORDER — MORPHINE SULFATE (PF) 4 MG/ML IV SOLN: 3.0000 mg | INTRAVENOUS | Status: DC | PRN

## 2020-10-07 MED ORDER — TAMSULOSIN HCL 0.4 MG PO CAPS
0.8000 mg | ORAL_CAPSULE | Freq: Every day | ORAL | Status: DC
Start: 2020-10-07 — End: 2020-10-08
  Administered 2020-10-07: 0.8 mg via ORAL
  Filled 2020-10-07: qty 2

## 2020-10-07 MED ORDER — HYDRALAZINE HCL 20 MG/ML IJ SOLN
INTRAMUSCULAR | Status: AC
  Administered 2020-10-07: 10 mg via INTRAVENOUS
  Filled 2020-10-07: qty 1

## 2020-10-07 MED ORDER — HYDROCODONE-ACETAMINOPHEN 5-325 MG PO TABS: 1.0000 | ORAL_TABLET | ORAL | Status: DC | PRN

## 2020-10-07 MED ORDER — HYDRALAZINE HCL 20 MG/ML IJ SOLN
10.0000 mg | Freq: Four times a day (QID) | INTRAMUSCULAR | Status: DC | PRN
  Administered 2020-10-08: 10 mg via INTRAVENOUS

## 2020-10-07 MED ORDER — ACETAMINOPHEN 325 MG PO TABS: 650.0000 mg | ORAL_TABLET | Freq: Four times a day (QID) | ORAL | Status: DC | PRN

## 2020-10-07 MED ORDER — LISINOPRIL 20 MG PO TABS
40.0000 mg | ORAL_TABLET | Freq: Every day | ORAL | Status: DC
  Administered 2020-10-07: 40 mg via ORAL

## 2020-10-07 MED ORDER — HYDRALAZINE HCL 10 MG PO TABS
10.0000 mg | ORAL_TABLET | Freq: Four times a day (QID) | ORAL | Status: DC | PRN
Start: 2020-10-07 — End: 2020-10-07
  Filled 2020-10-07: qty 1

## 2020-10-07 MED ORDER — SODIUM CHLORIDE 0.9 % IV SOLN: 250.0000 mL | INTRAVENOUS | Status: DC | PRN

## 2020-10-07 MED ORDER — SENNOSIDES-DOCUSATE SODIUM 8.6-50 MG PO TABS: 1.0000 | ORAL_TABLET | Freq: Every evening | ORAL | Status: DC | PRN

## 2020-10-07 MED ORDER — INSULIN ASPART 100 UNIT/ML ~~LOC~~ SOLN
0.0000 [IU] | SUBCUTANEOUS | Status: DC
  Administered 2020-10-07 (×2): 1 [IU] via SUBCUTANEOUS
  Administered 2020-10-07 – 2020-10-08 (×3): 2 [IU] via SUBCUTANEOUS
  Administered 2020-10-08: 1 [IU] via SUBCUTANEOUS
  Filled 2020-10-07 (×6): qty 1

## 2020-10-07 NOTE — ED Notes (Signed)
Pt son updated at this time

## 2020-10-07 NOTE — ED Notes (Signed)
VS taken prior to pt being DC'd.  Pt's HR noted to be 34 on assessment.  Dr. Kerman Passey made aware and EKG order obtained and completed.

## 2020-10-07 NOTE — Telephone Encounter (Signed)
Agree with ED evaluation.  Patient's currently admitted to the hospital.

## 2020-10-07 NOTE — Consult Note (Addendum)
Cardiology Consultation:   Patient ID: Zachary Santos MRN: [id number]; DOB: 11/17/1935  Admit date: 10/06/2020 Date of Consult: 10/07/2020  Primary Care Provider: Leone Haven, MD [facility] Cardiologist: Kate Sable, MD [facility] Electrophysiologist:  None    Patient Profile:   Zachary Santos is a 84 y.o. male with a hx of hypertension, DM2, paroxysmal atrial fibrillation on Eliquis and amiodarone, history of syncope tracing back to 2016, COPD, current smoker x70 years, who is being seen today for the evaluation of syncope and transient third-degree block at the request of Dr. Billie Ruddy.  History of Present Illness:   Zachary Santos is an 84 year old male with PMH as above. He reports tobacco use for the past 7 years without desire to quit. On review of EMR, he has a history of syncopal events with previous events tracing back to a consult note in 2016.  He is not vaccinated against COVID-19.  Unfortunately, HPI may be limited by his current cognitive status. He reports that he has never seen a cardiologist and does not take cardiac medications, despite history of visits at [facility] with cardiac medications prescribed. In the past, medication noncompliance has been documented. Today, he reports that he does not take any medications, making it difficult to assess compliance.  He reports he is a current smoker 1 pack/day. He has no desire to quit smoking. He does not drink alcohol or use any illegal drugs. He reports his BP is well controlled without medications and usually SBP 120s.   He was last seen in the office by Dr. Garen Lah after presenting to the ED 05/16/2020 with weakness and presyncope.  ED EKG showed atrial fibrillation with RVR and rates up into the 140s.  Due to long wait times, he left without being fully evaluated by the MD.  He followed up with his PCP, who started him on Eliquis 5 mg twice daily.  He was NSR at the time of this visit.  Severe symptoms were  reported in A. fib.  Recommendation at the time of this visit was to stop taking amlodipine and ASA 81 mg daily and start amiodarone 200 mg twice daily for 1 week then 1 tablet once daily thereafter.  Repeat echo was ordered.  Over the last weekend, he has experienced 2 syncopal events.   The first syncopal event occurred 10/04/2020. He reports watching television when he felt lightheaded while sitting in his chair. He subsequently lost consciousness and reports hitting his head and was found by his wife. He presented to [facility] emergency department 10/04/2020, where he described some R shoulder pain, weakness, and shortness of breath. No chest pain. Head CT unremarkable. He was discharged home.  He experienced an additional syncopal event early 12/20 at about 3:00 AM. He reports that he was unable to sleep, as he has not been sleeping well over the past few days. He got up to watch television and was again sitting in his chair when he suddenly lost consciousness. He reports again feeling dizzy/lightheaded before LOC. He again states he hit his head. He woke up on the floor with his wife above him. He again presented to [facility] emergency department, though he reportedly took a day to present, as he did not feel like going to the ED right away and wanted to wait and go later. During evaluation today, he states he feels fine. On further questioning, he does report some L sided neck and shoulder pain. He denies any shortness of breath, chest pain,  racing heart rate, palpitations. No further dizziness. He denies any history of lower extremity swelling or any signs or symptoms of volume overload. No recent report of fever or nausea.  In the emergency department, initial vitals significant for hypertension with BP 177/87, HR 55 bpm. Initial labs significant for hyperglycemia with glucose 222 and Cr 1.46 (baseline Cr 1.3-1.4), BUN 20, K 3.8. CT head and spine unremarkable.  He requested to be discharged home with  recommendation to add cardiac enzymes as precaution.  He did not wish to wait.  He attributed his syncope to not eating well.  On discharge, however, he was noted to be quite bradycardic quite bradycardic in the 30s.  Repeat EKG showed what is documented as complete heart block. EP read of EKG 2:1 AVB and unable to find evidence of CHB on review of EMR.  He was placed on ZOLL monitor/pads with recommendation for admission and cardiology/EP consultation.  Amiodarone was discontinued with patient reporting that he was unsure if he was taking this and today states that he has never taken this medication on my consultation.  He again states that he does not have a cardiologist.  Past Medical History:  Diagnosis Date  . Anxiety   . BPH (benign prostatic hyperplasia)   . Chronic fatigue   . CKD (chronic kidney disease), stage III ([place])   . Claudication [facility])    a. R Hip.  [name] COPD (chronic obstructive pulmonary disease) ([place])   . Dyspnea    with exertion  . Essential hypertension    a. 01/2008 Echo: EF 65%, no rwma, mod increased PASP.  [name] Hypercholesterolemia   . Osteoarthritis   . Paget's disease   . Syncope    a. 03/2015 in setting of hypotension.  . Tobacco abuse    a. 1-1.5 ppd x 60+ yrs.  . Type II diabetes mellitus ([place])   . Wears partial dentures    uppers    Past Surgical History:  Procedure Laterality Date  . APPENDECTOMY    . CATARACT EXTRACTION W/PHACO Left 12/11/2019   Procedure: CATARACT EXTRACTION PHACO AND INTRAOCULAR LENS PLACEMENT ([place]) LEFT DIABETIC MALYUGIN;  Surgeon: Birder Robson, MD;  Location: [place];  Service: Ophthalmology;  Laterality: Left;  CDE 11.45 U/S  1:05.7  . CATARACT EXTRACTION W/PHACO Right 01/08/2020   Procedure: CATARACT EXTRACTION PHACO AND INTRAOCULAR LENS PLACEMENT ([facility]) RIGHT DIABETIC 5.75  00:49.3;  Surgeon: Birder Robson, MD;  Location: [place];  Service: Ophthalmology;  Laterality: Right;  Diabetic  . laparscopic  chole    . SPINE SURGERY     cervical spine surgery  . trigeminal neuralgia     with surgery in [place] Medications:  Prior to Admission medications   Medication Sig Start Date End Date Taking? Authorizing Provider  amiodarone (PACERONE) 200 MG tablet Take 1 tablet by mouth two times a day for one week, then take 1 tablet by mouth once daily. 06/30/20  Yes Agbor-Etang, Aaron Edelman, MD  apixaban (ELIQUIS) 5 MG TABS tablet Take 1 tablet (5 mg total) by mouth 2 (two) times daily. 09/10/20  Yes Leone Haven, MD  blood glucose meter kit and supplies KIT Dispense based on patient and insurance preference. Use up to four times daily as directed. (FOR ICD-9 250.00, 250.01). 09/16/20  Yes Leone Haven, MD  latanoprost (XALATAN) 0.005 % ophthalmic solution INSTILL 1 DROP INTO EACH EYE AT BEDTIME 07/18/19  Yes [provider]  lisinopril (ZESTRIL) 20  MG tablet Take 1 tablet (20 mg total) by mouth daily. 09/10/20  Yes Leone Haven, MD  metFORMIN (GLUCOPHAGE XR) 500 MG 24 hr tablet Take 1 tablet (500 mg total) by mouth in the morning and at bedtime. 09/10/20  Yes Leone Haven, MD  ONE TOUCH ULTRA TEST test strip USE TEST STRIP TO CHECK GLUCOSE AT LEAST ONCE DAILY 05/06/16  Yes Thersa Salt G, DO  rosuvastatin (CRESTOR) 20 MG tablet Take 1 tablet by mouth once daily 08/05/20  Yes Leone Haven, MD  tamsulosin [facility]) 0.4 MG CAPS capsule Take 2 capsules by mouth once daily 12/05/19  Yes Leone Haven, MD  vitamin B-12 (CYANOCOBALAMIN) 500 MCG tablet Take 500 mcg by mouth daily.   Yes [provider]    Inpatient Medications: Scheduled Meds: . insulin aspart  0-9 Units Subcutaneous Q4H  . lisinopril  40 mg Oral Daily  . rosuvastatin  20 mg Oral q1800  . sodium chloride flush  3 mL Intravenous Q12H  . sodium chloride flush  3 mL Intravenous Q12H  . tamsulosin  0.8 mg Oral Daily   Continuous Infusions: . sodium chloride    . heparin 1,100 Units/hr  (10/07/20 0534)   PRN Meds: sodium chloride, acetaminophen **OR** acetaminophen, atropine, hydrALAZINE, HYDROcodone-acetaminophen, morphine injection, ondansetron **OR** ondansetron (ZOFRAN) IV, senna-docusate, sodium chloride flush  Allergies:   No Known Allergies  Social History:   Social History   Socioeconomic History  . Marital status: Married    Spouse name: Not on file  . Number of children: Not on file  . Years of education: Not on file  . Highest education level: Not on file  Occupational History  . Not on file  Tobacco Use  . Smoking status: Current Every Day Smoker    Packs/day: 1.50    Years: 60.00    Pack years: 90.00    Types: Cigarettes  . Smokeless tobacco: Current User  Substance and Sexual Activity  . Alcohol use: Not Currently    Alcohol/week: 0.0 standard drinks    Comment: rare - "1% of the time."  . Drug use: No  . Sexual activity: Not on file  Other Topics Concern  . Not on file  Social History Narrative   Lives in [place] with wife.  Retired Administrator.  Very sedentary.  Avoids activity b/c he "gives out" and is limited by right hip/buttock discomfort/claudication.   Social Determinants of Health   Financial Resource Strain: Medium Risk  . Difficulty of Paying Living Expenses: Somewhat hard  Food Insecurity: No Food Insecurity  . Worried About Charity fundraiser in the Last Year: Never true  . Ran Out of Food in the Last Year: Never true  Transportation Needs: No Transportation Needs  . Lack of Transportation (Medical): No  . Lack of Transportation (Non-Medical): No  Physical Activity: Not on file  Stress: No Stress Concern Present  . Feeling of Stress : Only a little  Social Connections: Unknown  . Frequency of Communication with Friends and Family: More than three times a week  . Frequency of Social Gatherings with Friends and Family: Once a week  . Attends Religious Services: Not on file  . Active Member of Clubs or Organizations:  Not on file  . Attends Archivist Meetings: Not on file  . Marital Status: Married  Human resources officer Violence: Not At Risk  . Fear of Current or Ex-Partner: No  . Emotionally Abused: No  . Physically Abused: No  .  Sexually Abused: No    Family History:    Family History  Problem Relation Age of Onset  . Cancer Mother        died @ 45.  [name] COPD Father        died @ 34.  . Diabetes Brother        died in early 54's.     ROS:  Please see the history of present illness.  Review of Systems  Constitutional: Positive for malaise/fatigue.  Respiratory: Negative for hemoptysis and shortness of breath.   Cardiovascular: Negative for chest pain, palpitations and leg swelling.  Gastrointestinal: Negative for abdominal pain, melena, nausea and vomiting.  Genitourinary: Negative for hematuria.  Musculoskeletal: Positive for back pain, falls, myalgias and neck pain.  Neurological: Positive for dizziness and loss of consciousness. Negative for focal weakness.  Psychiatric/Behavioral: The patient has insomnia.   All other systems reviewed and are negative.   All other ROS reviewed and negative.     Physical Exam/Data:   Vitals:   10/07/20 0530 10/07/20 0907 10/07/20 0931 10/07/20 0936  BP: (!) 186/70 (!) 195/81 (!) 195/81 (!) 195/81  Pulse: (!) 50 (!) 56    Resp: 16 19    Temp:      TempSrc:      SpO2: 95% 95%    Weight:      Height:       No intake or output data in the 24 hours ending 10/07/20 1114 Last 3 Weights 10/06/2020 10/04/2020 08/18/2020  Weight (lbs) 176 lb 176 lb 185 lb  Weight (kg) 79.833 kg 79.833 kg 83.915 kg     Body mass index is 23.87 kg/m.  General: Elderly and frail male, NAD HEENT: normal Lymph: no adenopathy Neck: no JVD Endocrine:  No thryomegaly Vascular: No carotid bruits; FA pulses 2+ bilaterally without bruits  Cardiac: distant heart sounds, bradycardic but regular without murmurs Lungs:poor inspiratory effort, coarse breath sounds  bilaterally  Abd: soft, nontender, no hepatomegaly  Ext: moderate to 1+ with LEE >RLEE, reported as chronic Musculoskeletal:  No deformities, strength not assessed Skin: warm and dry  Neuro:  CNs 2-12 intact, no focal abnormalities noted, at times confused Psych:  Normal affect. At times confused  EKG:  The EKG was personally reviewed and demonstrates: 2:1 AVB, 34bpm, Q wave in lead III, prolonged QRS, IVCD, QTc 411, poor R wave progression in V1, nonspecific changes Telemetry:  Not available, likely 1/1 start of discharge before bradycardic rate observed  Relevant CV Studies: Echo 10/07/20 1. Left ventricular ejection fraction, by estimation, is 55 to 60%. The  left ventricle has normal function. The left ventricle has no regional  wall motion abnormalities. There is mild left ventricular hypertrophy.  Left ventricular diastolic parameters  were normal.  2. Right ventricular systolic function is normal. The right ventricular  size is normal. There is normal pulmonary artery systolic pressure.  3. The mitral valve is normal in structure. No evidence of mitral valve  regurgitation. No evidence of mitral stenosis.  4. The aortic valve is normal in structure. Aortic valve regurgitation is  not visualized. No aortic stenosis is present.  5. The inferior vena cava is normal in size with greater than 50%  respiratory variability, suggesting right atrial pressure of 3 mmHg.   Laboratory Data:  High Sensitivity Troponin:   Recent Labs  Lab 10/04/20 1824  TROPONINIHS 14     Chemistry Recent Labs  Lab 10/04/20 1627 10/06/20 1857 10/07/20 0515  NA 136 137  136  K 3.6 4.3 3.8  CL 99 100 101  CO2 _0 GLUCOSE 254* 223* 168*  BUN _1 CREATININE 1.34* 1.46* 1.49*  CALCIUM 8.8* 9.5 8.9  GFRNONAA 53* 48* 47*  ANIONGAP _2 No results for input(s): PROT, ALBUMIN, AST, ALT, ALKPHOS, BILITOT in the last 168 hours. Hematology Recent Labs  Lab 10/04/20 1627  10/06/20 1857 10/07/20 0515  WBC 7.4 10.0 8.1  RBC 4.48 4.55 4.18*  HGB 13.8 14.2 12.8*  HCT 41.2 41.5 38.1*  MCV 92.0 91.2 91.1  MCH 30.8 31.2 30.6  MCHC 33.5 34.2 33.6  RDW 13.0 12.9 12.8  PLT 144* 155 142*   BNPNo results for input(s): BNP, PROBNP in the last 168 hours.  DDimer No results for input(s): DDIMER in the last 168 hours.   Radiology/Studies:  CT Head Wo Contrast  Result Date: 10/06/2020 CLINICAL DATA:  Passing out and hitting head.  Nausea. EXAM: CT HEAD WITHOUT CONTRAST CT CERVICAL SPINE WITHOUT CONTRAST TECHNIQUE: Multidetector CT imaging of the head and cervical spine was performed following the standard protocol without intravenous contrast. Multiplanar CT image reconstructions of the cervical spine were also generated. COMPARISON:  CT head 02/04/2008. FINDINGS: CT HEAD FINDINGS Brain: Patchy and confluent areas of decreased attenuation are noted throughout the deep and periventricular white matter of the cerebral hemispheres bilaterally, compatible with chronic microvascular ischemic disease. Redemonstration of left cerebellar hypodensity. No evidence of large-territorial acute infarction. No parenchymal hemorrhage. No mass lesion. No extra-axial collection. No mass effect or midline shift. No hydrocephalus. Basilar cisterns are patent. Vascular: No hyperdense vessel. Skull: No acute fracture or focal lesion. Prior left sub-occipital craniotomy. Sinuses/Orbits: Paranasal sinuses and mastoid air cells are clear. The orbits are unremarkable. Other: None. CT CERVICAL SPINE FINDINGS Alignment: Normal. Skull base and vertebrae: Surgical changes related to anterior C4 through C6 fusion. No definite hardware fracture or surrounding lucency. No prior images to compare to. No acute displaced fracture. No aggressive appearing focal osseous lesion or focal pathologic process. Soft tissues and spinal canal: No prevertebral fluid or swelling. No visible canal hematoma. Disc levels:   Maintained. Upper chest: Right apical paraseptal emphysematous changes as well as/pulmonary scarring. Other: None. IMPRESSION: 1. No acute intracranial abnormality. 2. No acute fracture or traumatic listhesis of the cervical spine. Electronically Signed   By: Iven Finn M.D.   On: 10/06/2020 23:25   CT Head Wo Contrast  Result Date: 10/04/2020 CLINICAL DATA:  Syncope, fell and hit head, weakness, anticoagulated EXAM: CT HEAD WITHOUT CONTRAST TECHNIQUE: Contiguous axial images were obtained from the base of the skull through the vertex without intravenous contrast. COMPARISON:  07/21/2016 FINDINGS: Brain: Confluent hypodensities throughout the periventricular white matter are grossly stable, consistent with chronic small vessel ischemic changes. Stable mild encephalomalacia left cerebellar hemisphere, either postsurgical or related to chronic ischemic change. No evidence of acute infarct or hemorrhage. The lateral ventricles and remaining midline structures are unremarkable. No acute extra-axial fluid collections. No mass effect. Vascular: No hyperdense vessel or unexpected calcification. Skull: Stable changes from left occipital craniectomy. Remainder of the calvarium is unremarkable. Sinuses/Orbits: No acute finding. Other: None. IMPRESSION: 1. No acute intracranial process.  Grossly stable exam. Electronically Signed   By: Randa Ngo M.D.   On: 10/04/2020 17:01   CT Cervical Spine Wo Contrast  Result Date: 10/06/2020 CLINICAL DATA:  Passing out and hitting head.  Nausea. EXAM: CT HEAD WITHOUT CONTRAST CT CERVICAL SPINE WITHOUT CONTRAST  TECHNIQUE: Multidetector CT imaging of the head and cervical spine was performed following the standard protocol without intravenous contrast. Multiplanar CT image reconstructions of the cervical spine were also generated. COMPARISON:  CT head 02/04/2008. FINDINGS: CT HEAD FINDINGS Brain: Patchy and confluent areas of decreased attenuation are noted throughout the  deep and periventricular white matter of the cerebral hemispheres bilaterally, compatible with chronic microvascular ischemic disease. Redemonstration of left cerebellar hypodensity. No evidence of large-territorial acute infarction. No parenchymal hemorrhage. No mass lesion. No extra-axial collection. No mass effect or midline shift. No hydrocephalus. Basilar cisterns are patent. Vascular: No hyperdense vessel. Skull: No acute fracture or focal lesion. Prior left sub-occipital craniotomy. Sinuses/Orbits: Paranasal sinuses and mastoid air cells are clear. The orbits are unremarkable. Other: None. CT CERVICAL SPINE FINDINGS Alignment: Normal. Skull base and vertebrae: Surgical changes related to anterior C4 through C6 fusion. No definite hardware fracture or surrounding lucency. No prior images to compare to. No acute displaced fracture. No aggressive appearing focal osseous lesion or focal pathologic process. Soft tissues and spinal canal: No prevertebral fluid or swelling. No visible canal hematoma. Disc levels:  Maintained. Upper chest: Right apical paraseptal emphysematous changes as well as/pulmonary scarring. Other: None. IMPRESSION: 1. No acute intracranial abnormality. 2. No acute fracture or traumatic listhesis of the cervical spine. Electronically Signed   By: Iven Finn M.D.   On: 10/06/2020 23:25   ECHOCARDIOGRAM COMPLETE  Result Date: 10/07/2020    ECHOCARDIOGRAM REPORT   Patient Name:   XAIDEN FLEIG Date of Exam: 10/07/2020 Medical Rec #:  [id number]     Height:       72.0 in Accession #:    [id number]    Weight:       176.0 lb Date of Birth:  1936/07/12     BSA:          2.018 m Patient Age:    64 years      BP:           186/70 mmHg Patient Gender: M             HR:           50 bpm. Exam Location:  [facility] Procedure: 2D Echo, Cardiac Doppler and Color Doppler Indications:     Syncope 780.2  History:         Patient has prior history of Echocardiogram examinations, most                  recent  07/03/2020. COPD; Risk Factors:Hypertension and Diabetes.  Sonographer:     Sherrie Sport RDCS (AE) Referring Phys:  [id number] Ilene Qua OPYD Diagnosing Phys: Kathlyn Sacramento MD IMPRESSIONS  1. Left ventricular ejection fraction, by estimation, is 55 to 60%. The left ventricle has normal function. The left ventricle has no regional wall motion abnormalities. There is mild left ventricular hypertrophy. Left ventricular diastolic parameters were normal.  2. Right ventricular systolic function is normal. The right ventricular size is normal. There is normal pulmonary artery systolic pressure.  3. The mitral valve is normal in structure. No evidence of mitral valve regurgitation. No evidence of mitral stenosis.  4. The aortic valve is normal in structure. Aortic valve regurgitation is not visualized. No aortic stenosis is present.  5. The inferior vena cava is normal in size with greater than 50% respiratory variability, suggesting right atrial pressure of 3 mmHg. FINDINGS  Left Ventricle: Left ventricular ejection fraction, by estimation, is 55 to 60%. The left ventricle has  normal function. The left ventricle has no regional wall motion abnormalities. The left ventricular internal cavity size was normal in size. There is  mild left ventricular hypertrophy. Left ventricular diastolic parameters were normal. Right Ventricle: The right ventricular size is normal. No increase in right ventricular wall thickness. Right ventricular systolic function is normal. There is normal pulmonary artery systolic pressure. The tricuspid regurgitant velocity is 2.76 m/s, and  with an assumed right atrial pressure of 5 mmHg, the estimated right ventricular systolic pressure is 68.3 mmHg. Left Atrium: Left atrial size was normal in size. Right Atrium: Right atrial size was normal in size. Pericardium: There is no evidence of pericardial effusion. Mitral Valve: The mitral valve is normal in structure. No evidence of mitral valve regurgitation. No  evidence of mitral valve stenosis. Tricuspid Valve: The tricuspid valve is normal in structure. Tricuspid valve regurgitation is trivial. No evidence of tricuspid stenosis. Aortic Valve: The aortic valve is normal in structure. Aortic valve regurgitation is not visualized. No aortic stenosis is present. Aortic valve mean gradient measures 7.0 mmHg. Aortic valve peak gradient measures 12.2 mmHg. Aortic valve area, by VTI measures 2.10 cm. Pulmonic Valve: The pulmonic valve was normal in structure. Pulmonic valve regurgitation is not visualized. No evidence of pulmonic stenosis. Aorta: The aortic root is normal in size and structure. Venous: The inferior vena cava is normal in size with greater than 50% respiratory variability, suggesting right atrial pressure of 3 mmHg. IAS/Shunts: No atrial level shunt detected by color flow Doppler.  LEFT VENTRICLE PLAX 2D LVIDd:         4.28 cm  Diastology LVIDs:         2.75 cm  LV e' medial:    4.57 cm/s LV PW:         1.26 cm  LV E/e' medial:  18.2 LV IVS:        1.16 cm  LV e' lateral:   5.66 cm/s LVOT diam:     2.00 cm  LV E/e' lateral: 14.7 LV SV:         88 LV SV Index:   43 LVOT Area:     3.14 cm  LEFT ATRIUM         Index LA diam:    3.50 cm 1.73 cm/m  AORTIC VALVE                    PULMONIC VALVE AV Area (Vmax):    1.87 cm     PV Vmax:        0.95 m/s AV Area (Vmean):   1.77 cm     PV Peak grad:   3.6 mmHg AV Area (VTI):     2.10 cm     RVOT Peak grad: 5 mmHg AV Vmax:           175.00 cm/s AV Vmean:          126.000 cm/s AV VTI:            0.417 m AV Peak Grad:      12.2 mmHg AV Mean Grad:      7.0 mmHg LVOT Vmax:         104.00 cm/s LVOT Vmean:        71.000 cm/s LVOT VTI:          0.279 m LVOT/AV VTI ratio: 0.67  AORTA Ao Root diam: 2.90 cm MITRAL VALVE  TRICUSPID VALVE MV Area (PHT): 2.82 cm     TR Peak grad:   30.5 mmHg MV Decel Time: 269 msec     TR Vmax:        276.00 cm/s MV E velocity: 83.40 cm/s MV A velocity: 108.00 cm/s  SHUNTS MV E/A  ratio:  0.77         Systemic VTI:  0.28 m                             Systemic Diam: 2.00 cm Kathlyn Sacramento MD Electronically signed by Kathlyn Sacramento MD Signature Date/Time: 10/07/2020/11:08:06 AM    Final      Assessment and Plan:   2:1 AVB and Bradycardia Recurrent Syncope History of Afib with RVR on PTA [place] Concern for SSS --Denies current symptoms.  Reports 2 syncopal episodes over the weekend, during which time he completely lost consciousness and hit his head.  Preceeding sx include dizziness. Labs unremarkable with electrolytes wnl, H&H stable. He does have a long history of syncope, dating back to 2016.  Head CT unremarkable, though some confusion noted today regarding having a cardiologist and taking cardiac medications.  Telemetry showed bradycardia. At pt requested discharge, rates noted to be in the 30s. Repeat EKG showed 2:1 AVB with documented transient CHB per ED, though not seen per review of EMR or telemetry (which was reset, given plan for discharge). Discharge was cancelled with plan for admission. Amiodarone discontinued and Zoll pads placed. Amiodarone will continue to washout. Given known history of atrial fibrillation with RVR, concern for SSS. Updated echo as above  Labs  Daily BMET.   Close monitoring of electrolytes.  Most recent electrolytes WNL. K goal 4.0.  Mg goal 2.0. Cr slightly elevated but within normal baseline.   Daily CBC.   Reportedly hit his head on prescribed PTA anticoagulation, though unclear if he is taking at home. Continue current IV heparin.  Most recent H&H stable.  Denies any signs or symptoms of bleeding.  Glucose /SSI.    Will check TSH.  Vitals  Continue to monitor on telemetry / Zoll pads.  BP control.  Medications  Avoid AV nodal blocking agents.   Hold PTA amiodarone as above to allow for washout.   Hold PTA Eliquis. Continue IV heparin with close monitoring of CBC.  Caution / avoid narcotics.   Further recommendations  pending EP to see patient to evaluate for consideration of PPM.  History of Afib with RVR --Hold amiodarone as above. --Avoid AV nodal blocking agents. --Hold PTA Eliquis. Continue IV heparin as CBC allows. CHA2DS2VASc score of at least  4 (HTN, age2, DM2).  --Continue to monitor electrolytes.  Will check TSH.  HTN --Continue lisinopril 40 mg daily and as needed hydralazine. --As above, avoid AV nodal blocking agents.  HLD --Continue current statin.  DM2 --SSI. Per IM. Strict glycemic control.            CHA2DS2-VASc Score = 4  This indicates a 4.8% annual risk of stroke. The patient's score is based upon: CHF History: No HTN History: Yes Diabetes History: Yes Stroke History: No Vascular Disease History: No Age Score: 2 Gender Score: 0         For questions or updates, please contact [facility] Please consult [email or web address] for contact info under    Signed, Arvil Chaco, PA-C  10/07/2020 11:14 AM   Syncope recurrent-- abrupt onset  2:1 AV block -abrupt  with narrow QRS  (reports of CHB but no strips to support are visible)  Afib persistent-RVR  On amiodarone    Pt with recurrent syncope and intermittent high grade heart block.  I had anticipated with narrow QRS and 2:1 heart block that there may be evidence of Wenckebach to suggest a proximal AV nodal issue;  I did not see, althought there were some blocked PACs--makes me wonder if there is more distal AV nodal disease as the cause of his 2:1 block  It is very likely, and reasonable to infer that his syncope his related to his rhythm.  The dureation of his events cause pause and I have told his there is some likelihood of other mechanisms--eg Carotid hypersensitivity, but I would have thought such a mechanism would be assoc with Wenckebach physiology  He has agreed to pacing.  Normal protocol would be to transfer to [facility], but ongoing efforts to begin to do pacing at [facility] and have spoken with Dr  Quentin Ore who will work on arranging the PM to be placed at [facility]  The benefits and risks were reviewed including but not limited to death,  perforation, infection, lead dislodgement and device malfunction.  The patient understands agrees and is willing to proceed.   ,

## 2020-10-07 NOTE — ED Notes (Signed)
Per cardiology verbal order, d/c heparin at this time. Also per cardiology, pt is allowed to eat.

## 2020-10-07 NOTE — ED Provider Notes (Addendum)
-----------------------------------------   12:36 AM on 10/07/2020 -----------------------------------------  Patient CT scans are negative.  Lab work shows no significant findings.  Patient is asking to be discharged home.  I discussed with patient adding on cardiac enzymes as a precaution.  States he does not want to wait any longer and wishes to go home.  I did discuss with the patient given his nausea on Saturday and syncopal episodes we will perform a Covid swab which she was agreeable.  Has not been vaccinated for Covid.  Patient however states he does not want a wait for the results and wishes to go home.  States he feels like he has not been eating enough which could be a cause of his symptoms.  I discussed with the patient the need to follow-up with a cardiologist.  Son states they will call 1st thing in the morning to arrange a follow-up appointment.  I did discuss with the son if any further symptoms happen lightheadedness dizziness syncope he is to return immediately to the emergency department.   Harvest Dark, MD 10/07/20 0037   Upon discharge vitals patient now noted to be quite bradycardic in the 30s.  Repeat EKG performed in the emergency department shows what appears to be a complete heart block.  Shortly after EKG was obtained patient had reverted back to a normal sinus rhythm again at 57 bpm.  However given the EKG showing what appears to be a clear third-degree heart block patient will be admitted to the hospital service for further work-up treatment and cardiology involvement.  We will place the patient on zoll monitor/pads, cardiac monitoring and admit to the hospital service.  I have added on cardiac enzymes.  Per record review patient appears to be on amiodarone could be a possible cause of medically induced bradycardia.  We will discontinue its use.  Patient states he is not sure if he is still taking this.  Does not have a cardiologist.  Repeat EKG viewed and interpreted  by myself shows what appears to be a complete heart block at 34 bpm with a widened QRS, left axis deviation, nonspecific ST changes.   Repeat EKG #2 viewed and interpreted by myself shows sinus bradycardia 57 bpm with a narrow QRS normal axis, prolonged PR interval otherwise normal intervals, no concerning ST changes.  CRITICAL CARE Performed by: Harvest Dark   Total critical care time: 30 minutes  Critical care time was exclusive of separately billable procedures and treating other patients.  Critical care was necessary to treat or prevent imminent or life-threatening deterioration.  Critical care was time spent personally by me on the following activities: development of treatment plan with patient and/or surrogate as well as nursing, discussions with consultants, evaluation of patient's response to treatment, examination of patient, obtaining history from patient or surrogate, ordering and performing treatments and interventions, ordering and review of laboratory studies, ordering and review of radiographic studies, pulse oximetry and re-evaluation of patient's condition.      Harvest Dark, MD 10/07/20 [phone]

## 2020-10-07 NOTE — Progress Notes (Signed)
PROGRESS NOTE    Zachary Santos  [id number] DOB: 09-22-1936 DOA: 10/06/2020 PCP: Leone Haven, MD    Assessment & Plan:   Principal Problem:   Cardiac syncope Active Problems:   COPD (chronic obstructive pulmonary disease) ([place])   DM type 2 (diabetes mellitus, type 2) ([place])   CKD (chronic kidney disease), stage III ([place])   A-fib ([place])   Zachary Santos is a 84 y.o. male with medical history significant for COPD, chronic kidney disease stage III, type 2 diabetes mellitus, hypertension, and atrial fibrillation on Eliquis, now presenting to the emergency department after syncopal episode.  Patient had been seen in the emergency department 2 days earlier after syncopal episode that was preceded by acute lightheadedness and nausea.  He had a reassuring work-up in the ED at that time and went home where he had another episode.  Episode occurred while he was seated.  He has been feeling generally weak and fatigued throughout the day, had some nausea, no chest pain or palpitations.   In the ED, EKG demonstrated sinus bradycardia with rate 55 and first-degree AV nodal block.  Later, heart rate reportedly dropped to the low 30s and appeared to be in complete heart block per report of ED physician.  Patient currently back in sinus rhythm with rate in the mid 50s.  1. Recurrent Syncope Suspected intermittent heart block  Concern for SSS --Home amiodarone held and Zoll pads placed Plan: --cardiology consult today --likely PPM placement tomorrow --cont tele monitoring  2. Hx of Afib w RVR on Eliquis --currently bradycardic --started on IV heparin gtt on admission Plan: --cont IV heparin gtt --hold home Eliquis  3. CKD IIIa  - SCr is 1.46 on admission, similar to priors  - Renally-dose medications, monitor    4. Type II DM  - A1c was 9.6% in August 2021  - SSI for now  HTN --cont home Lisinopril 40 mg daily --IV hydralazine 10 mg PRN for systolic [id number]  BPH --cont home  Flomax   DVT prophylaxis: On:IV heparin gtt Code Status: Full code  Family Communication:  Status is: inpatient Dispo:   The patient is from: home Anticipated d/c is to: home Anticipated d/c date is: > 3 days Patient currently is not medically stable to d/c due to: heart block being considered for PPM.   Subjective and Interval History:  Pt complained of dyspnea, and pain in his neck from his fall.  Also very hungry.  No N/V/D.   Objective: Vitals:   10/07/20 0936 10/07/20 1200 10/07/20 1500 10/07/20 1817  BP: (!) 195/81 (!) 151/53 (!) 146/56 (!) 141/77  Pulse:  (!) 53 (!) 56 (!) 41  Resp:  17 (!) 23 20  Temp:      TempSrc:      SpO2:  94% 97% 99%  Weight:      Height:       No intake or output data in the 24 hours ending 10/07/20 1929 Filed Weights   10/06/20 1835  Weight: 79.8 kg    Examination:   Constitutional: NAD, AAOx3 HEENT: conjunctivae and lids normal, EOMI CV: very faint heart sounds, sinus rhythm, rate of 50's, No cyanosis.   RESP: clear, normal respiratory effort, on RA Extremities: No effusions, edema in BLE SKIN: warm, dry and intact Neuro: II - XII grossly intact.   Psych: depressed mood and affect.     Data Reviewed: I have personally reviewed following labs and imaging studies  CBC: Recent Labs  Lab 10/04/20 1627 10/06/20 1857 10/07/20 0515  WBC 7.4 10.0 8.1  HGB 13.8 14.2 12.8*  HCT 41.2 41.5 38.1*  MCV 92.0 91.2 91.1  PLT 144* 155 [id number]*   Basic Metabolic Panel: Recent Labs  Lab 10/04/20 1627 10/06/20 1857 10/07/20 0515  NA 136 137 136  K 3.6 4.3 3.8  CL 99 100 101  CO2 28 27 26   GLUCOSE 254* 223* 168*  BUN 15 19 20   CREATININE 1.34* 1.46* 1.49*  CALCIUM 8.8* 9.5 8.9  MG  --   --  1.7   GFR: Estimated Creatinine Clearance: 42 mL/min (A) (by C-G formula based on SCr of 1.49 mg/dL (H)). Liver Function Tests: No results for input(s): AST, ALT, ALKPHOS, BILITOT, PROT, ALBUMIN in the last 168 hours. No results for  input(s): LIPASE, AMYLASE in the last 168 hours. No results for input(s): AMMONIA in the last 168 hours. Coagulation Profile: Recent Labs  Lab 10/07/20 0515  INR 1.6*   Cardiac Enzymes: No results for input(s): CKTOTAL, CKMB, CKMBINDEX, TROPONINI in the last 168 hours. BNP (last 3 results) No results for input(s): PROBNP in the last 8760 hours. HbA1C: Recent Labs    10/07/20 0515  HGBA1C 8.6*   CBG: Recent Labs  Lab 10/07/20 0526 10/07/20 0930 10/07/20 1223 10/07/20 1629 10/07/20 1922  GLUCAP 142* 122* 120* 170* 197*   Lipid Profile: No results for input(s): CHOL, HDL, LDLCALC, TRIG, CHOLHDL, LDLDIRECT in the last 72 hours. Thyroid Function Tests: Recent Labs    10/07/20 0515  TSH 1.501   Anemia Panel: No results for input(s): VITAMINB12, FOLATE, FERRITIN, TIBC, IRON, RETICCTPCT in the last 72 hours. Sepsis Labs: No results for input(s): PROCALCITON, LATICACIDVEN in the last 168 hours.  Recent Results (from the past 240 hour(s))  Resp Panel by RT-PCR (Flu A&B, Covid) Nasopharyngeal Swab     Status: None   Collection Time: 10/07/20 12:45 AM   Specimen: Nasopharyngeal Swab; Nasopharyngeal(NP) swabs in vial transport medium  Result Value Ref Range Status   SARS Coronavirus 2 by RT PCR NEGATIVE NEGATIVE Final    Comment: (NOTE) SARS-CoV-2 target nucleic acids are NOT DETECTED.  The SARS-CoV-2 RNA is generally detectable in upper respiratory specimens during the acute phase of infection. The lowest concentration of SARS-CoV-2 viral copies this assay can detect is 138 copies/mL. A negative result does not preclude SARS-Cov-2 infection and should not be used as the sole basis for treatment or other patient management decisions. A negative result may occur with  improper specimen collection/handling, submission of specimen other than nasopharyngeal swab, presence of viral mutation(s) within the areas targeted by this assay, and inadequate number of viral copies(<138  copies/mL). A negative result must be combined with clinical observations, patient history, and epidemiological information. The expected result is Negative.  Fact Sheet for Patients:  EntrepreneurPulse.com.au  Fact Sheet for Healthcare Providers:  IncredibleEmployment.be  This test is no t yet approved or cleared by the [place] FDA and  has been authorized for detection and/or diagnosis of SARS-CoV-2 by FDA under an Emergency Use Authorization (EUA). This EUA will remain  in effect (meaning this test can be used) for the duration of the COVID-19 declaration under Section 564(b)(1) of the Act, 21 U.S.C.section 360bbb-3(b)(1), unless the authorization is terminated  or revoked sooner.       Influenza A by PCR NEGATIVE NEGATIVE Final   Influenza B by PCR NEGATIVE NEGATIVE Final    Comment: (NOTE) The Xpert Xpress SARS-CoV-2/FLU/RSV plus assay is intended as  an aid in the diagnosis of influenza from Nasopharyngeal swab specimens and should not be used as a sole basis for treatment. Nasal washings and aspirates are unacceptable for Xpert Xpress SARS-CoV-2/FLU/RSV testing.  Fact Sheet for Patients: BloggerCourse.com  Fact Sheet for Healthcare Providers: SeriousBroker.it  This test is not yet approved or cleared by the [place] FDA and has been authorized for detection and/or diagnosis of SARS-CoV-2 by FDA under an Emergency Use Authorization (EUA). This EUA will remain in effect (meaning this test can be used) for the duration of the COVID-19 declaration under Section 564(b)(1) of the Act, 21 U.S.C. section 360bbb-3(b)(1), unless the authorization is terminated or revoked.  Performed at [facility], [address], [place], [place] [address]       Radiology Studies: CT Head Wo Contrast  Result Date: 10/06/2020 CLINICAL DATA:  Passing out and hitting head.  Nausea.  EXAM: CT HEAD WITHOUT CONTRAST CT CERVICAL SPINE WITHOUT CONTRAST TECHNIQUE: Multidetector CT imaging of the head and cervical spine was performed following the standard protocol without intravenous contrast. Multiplanar CT image reconstructions of the cervical spine were also generated. COMPARISON:  CT head 02/04/2008. FINDINGS: CT HEAD FINDINGS Brain: Patchy and confluent areas of decreased attenuation are noted throughout the deep and periventricular white matter of the cerebral hemispheres bilaterally, compatible with chronic microvascular ischemic disease. Redemonstration of left cerebellar hypodensity. No evidence of large-territorial acute infarction. No parenchymal hemorrhage. No mass lesion. No extra-axial collection. No mass effect or midline shift. No hydrocephalus. Basilar cisterns are patent. Vascular: No hyperdense vessel. Skull: No acute fracture or focal lesion. Prior left sub-occipital craniotomy. Sinuses/Orbits: Paranasal sinuses and mastoid air cells are clear. The orbits are unremarkable. Other: None. CT CERVICAL SPINE FINDINGS Alignment: Normal. Skull base and vertebrae: Surgical changes related to anterior C4 through C6 fusion. No definite hardware fracture or surrounding lucency. No prior images to compare to. No acute displaced fracture. No aggressive appearing focal osseous lesion or focal pathologic process. Soft tissues and spinal canal: No prevertebral fluid or swelling. No visible canal hematoma. Disc levels:  Maintained. Upper chest: Right apical paraseptal emphysematous changes as well as/pulmonary scarring. Other: None. IMPRESSION: 1. No acute intracranial abnormality. 2. No acute fracture or traumatic listhesis of the cervical spine. Electronically Signed   By: Tish Frederickson M.D.   On: 10/06/2020 23:25   CT Cervical Spine Wo Contrast  Result Date: 10/06/2020 CLINICAL DATA:  Passing out and hitting head.  Nausea. EXAM: CT HEAD WITHOUT CONTRAST CT CERVICAL SPINE WITHOUT CONTRAST  TECHNIQUE: Multidetector CT imaging of the head and cervical spine was performed following the standard protocol without intravenous contrast. Multiplanar CT image reconstructions of the cervical spine were also generated. COMPARISON:  CT head 02/04/2008. FINDINGS: CT HEAD FINDINGS Brain: Patchy and confluent areas of decreased attenuation are noted throughout the deep and periventricular white matter of the cerebral hemispheres bilaterally, compatible with chronic microvascular ischemic disease. Redemonstration of left cerebellar hypodensity. No evidence of large-territorial acute infarction. No parenchymal hemorrhage. No mass lesion. No extra-axial collection. No mass effect or midline shift. No hydrocephalus. Basilar cisterns are patent. Vascular: No hyperdense vessel. Skull: No acute fracture or focal lesion. Prior left sub-occipital craniotomy. Sinuses/Orbits: Paranasal sinuses and mastoid air cells are clear. The orbits are unremarkable. Other: None. CT CERVICAL SPINE FINDINGS Alignment: Normal. Skull base and vertebrae: Surgical changes related to anterior C4 through C6 fusion. No definite hardware fracture or surrounding lucency. No prior images to compare to. No acute displaced fracture. No aggressive  appearing focal osseous lesion or focal pathologic process. Soft tissues and spinal canal: No prevertebral fluid or swelling. No visible canal hematoma. Disc levels:  Maintained. Upper chest: Right apical paraseptal emphysematous changes as well as/pulmonary scarring. Other: None. IMPRESSION: 1. No acute intracranial abnormality. 2. No acute fracture or traumatic listhesis of the cervical spine. Electronically Signed   By: Iven Finn M.D.   On: 10/06/2020 23:25   ECHOCARDIOGRAM COMPLETE  Result Date: 10/07/2020    ECHOCARDIOGRAM REPORT   Patient Name:   LAWYER BUSTER Date of Exam: 10/07/2020 Medical Rec #:  [id number]     Height:       72.0 in Accession #:    [id number]    Weight:       176.0 lb Date  of Birth:  [birth year]     BSA:          2.018 m Patient Age:    46 years      BP:           186/70 mmHg Patient Gender: M             HR:           50 bpm. Exam Location:  [facility] Procedure: 2D Echo, Cardiac Doppler and Color Doppler Indications:     Syncope 780.2  History:         Patient has prior history of Echocardiogram examinations, most                  recent 07/03/2020. COPD; Risk Factors:Hypertension and Diabetes.  Sonographer:     Sherrie Sport RDCS (AE) Referring Phys:  [id number] Ilene Qua OPYD Diagnosing Phys: Kathlyn Sacramento MD IMPRESSIONS  1. Left ventricular ejection fraction, by estimation, is 55 to 60%. The left ventricle has normal function. The left ventricle has no regional wall motion abnormalities. There is mild left ventricular hypertrophy. Left ventricular diastolic parameters were normal.  2. Right ventricular systolic function is normal. The right ventricular size is normal. There is normal pulmonary artery systolic pressure.  3. The mitral valve is normal in structure. No evidence of mitral valve regurgitation. No evidence of mitral stenosis.  4. The aortic valve is normal in structure. Aortic valve regurgitation is not visualized. No aortic stenosis is present.  5. The inferior vena cava is normal in size with greater than 50% respiratory variability, suggesting right atrial pressure of 3 mmHg. FINDINGS  Left Ventricle: Left ventricular ejection fraction, by estimation, is 55 to 60%. The left ventricle has normal function. The left ventricle has no regional wall motion abnormalities. The left ventricular internal cavity size was normal in size. There is  mild left ventricular hypertrophy. Left ventricular diastolic parameters were normal. Right Ventricle: The right ventricular size is normal. No increase in right ventricular wall thickness. Right ventricular systolic function is normal. There is normal pulmonary artery systolic pressure. The tricuspid regurgitant velocity is 2.76 m/s, and  with an  assumed right atrial pressure of 5 mmHg, the estimated right ventricular systolic pressure is [id number] mmHg. Left Atrium: Left atrial size was normal in size. Right Atrium: Right atrial size was normal in size. Pericardium: There is no evidence of pericardial effusion. Mitral Valve: The mitral valve is normal in structure. No evidence of mitral valve regurgitation. No evidence of mitral valve stenosis. Tricuspid Valve: The tricuspid valve is normal in structure. Tricuspid valve regurgitation is trivial. No evidence of tricuspid stenosis. Aortic Valve: The aortic valve is normal in structure. Aortic valve regurgitation  is not visualized. No aortic stenosis is present. Aortic valve mean gradient measures 7.0 mmHg. Aortic valve peak gradient measures 12.2 mmHg. Aortic valve area, by VTI measures 2.10 cm. Pulmonic Valve: The pulmonic valve was normal in structure. Pulmonic valve regurgitation is not visualized. No evidence of pulmonic stenosis. Aorta: The aortic root is normal in size and structure. Venous: The inferior vena cava is normal in size with greater than 50% respiratory variability, suggesting right atrial pressure of 3 mmHg. IAS/Shunts: No atrial level shunt detected by color flow Doppler.  LEFT VENTRICLE PLAX 2D LVIDd:         4.28 cm  Diastology LVIDs:         2.75 cm  LV e' medial:    4.57 cm/s LV PW:         1.26 cm  LV E/e' medial:  18.2 LV IVS:        1.16 cm  LV e' lateral:   5.66 cm/s LVOT diam:     2.00 cm  LV E/e' lateral: 14.7 LV SV:         88 LV SV Index:   43 LVOT Area:     3.14 cm  LEFT ATRIUM         Index LA diam:    3.50 cm 1.73 cm/m  AORTIC VALVE                    PULMONIC VALVE AV Area (Vmax):    1.87 cm     PV Vmax:        0.95 m/s AV Area (Vmean):   1.77 cm     PV Peak grad:   3.6 mmHg AV Area (VTI):     2.10 cm     RVOT Peak grad: 5 mmHg AV Vmax:           175.00 cm/s AV Vmean:          126.000 cm/s AV VTI:            0.417 m AV Peak Grad:      12.2 mmHg AV Mean Grad:      7.0 mmHg  LVOT Vmax:         104.00 cm/s LVOT Vmean:        71.000 cm/s LVOT VTI:          0.279 m LVOT/AV VTI ratio: 0.67  AORTA Ao Root diam: 2.90 cm MITRAL VALVE                TRICUSPID VALVE MV Area (PHT): 2.82 cm     TR Peak grad:   30.5 mmHg MV Decel Time: 269 msec     TR Vmax:        276.00 cm/s MV E velocity: 83.40 cm/s MV A velocity: 108.00 cm/s  SHUNTS MV E/A ratio:  0.77         Systemic VTI:  0.28 m                             Systemic Diam: 2.00 cm Kathlyn Sacramento MD Electronically signed by Kathlyn Sacramento MD Signature Date/Time: 10/07/2020/11:08:06 AM    Final      Scheduled Meds: . insulin aspart  0-9 Units Subcutaneous Q4H  . lisinopril  40 mg Oral Daily  . rosuvastatin  20 mg Oral q1800  . sodium chloride flush  3 mL Intravenous Q12H  . sodium chloride flush  3 mL Intravenous Q12H  . tamsulosin  0.8 mg Oral Daily   Continuous Infusions: . sodium chloride       LOS: 0 days     Enzo Bi, MD [facility] If 7PM-7AM, please contact night-coverage 10/07/2020, 7:29 PM

## 2020-10-07 NOTE — Telephone Encounter (Signed)
Patient went to [facility] and was admitted.  Zachary Santos,cma

## 2020-10-07 NOTE — ED Notes (Signed)
Family at bedside at this time

## 2020-10-07 NOTE — ED Notes (Signed)
Pt assisted to commode at this time.

## 2020-10-07 NOTE — ED Notes (Signed)
Messaged admitting MD Billie Ruddy regarding pt systolic BP, awaiting orders at this time.

## 2020-10-07 NOTE — ED Notes (Signed)
Pt given meal tray at this time 

## 2020-10-07 NOTE — H&P (Signed)
History and Physical    Zachary Santos OMB:[id number] DOB: 12/29/1935 DOA: 10/06/2020  PCP: Leone Haven, MD   Patient coming from: Home  Chief Complaint: Syncope   HPI: Zachary Santos is a 84 y.o. male with medical history significant for COPD, chronic kidney disease stage III, type 2 diabetes mellitus, hypertension, and atrial fibrillation on Eliquis, now presenting to the emergency department after syncopal episode.  Patient had been seen in the emergency department 2 days earlier after syncopal episode that was preceded by acute lightheadedness and nausea.  He had a reassuring work-up in the ED at that time and went home where he had another episode.  Episode occurred while he was seated.  He has been feeling generally weak and fatigued throughout the day, had some nausea, no chest pain or palpitations.  Denies leg swelling or shortness of breath.  ED Course: Upon arrival to the ED, patient is found to be afebrile, saturating well on room air, blood pressure 208/58, and heart rate in the 50s.  EKG demonstrates sinus bradycardia with rate 55 and first-degree AV nodal block.  Noncontrast head CT is negative for acute intracranial abnormality and no acute findings noted on cervical spine CT.  Chemistry panel features a glucose of 233 and creatinine 1.46 which is similar to priors.  CBC is unremarkable.  COVID-19 screening test not yet resulted.  Patient was being prepared for discharge from the emergency department when his heart rate reportedly dropped to the low 30s and appeared to be in complete heart block per report of ED physician.  Patient currently back in sinus rhythm with rate in the mid 50s.  Case was reviewed with the cardiology fellow on-call who recommended holding amiodarone and Eliquis, and starting IV heparin pending cardiology consultation.  Review of Systems:  All other systems reviewed and apart from HPI, are negative.  Past Medical History:  Diagnosis Date  . Anxiety    . BPH (benign prostatic hyperplasia)   . Chronic fatigue   . CKD (chronic kidney disease), stage III ([place])   . Claudication [facility])    a. R Hip.  [name] COPD (chronic obstructive pulmonary disease) ([place])   . Dyspnea    with exertion  . Essential hypertension    a. 01/2008 Echo: EF 65%, no rwma, mod increased PASP.  [name] Hypercholesterolemia   . Osteoarthritis   . Paget's disease   . Syncope    a. 03/2015 in setting of hypotension.  . Tobacco abuse    a. 1-1.5 ppd x 60+ yrs.  . Type II diabetes mellitus ([place])   . Wears partial dentures    uppers    Past Surgical History:  Procedure Laterality Date  . APPENDECTOMY    . CATARACT EXTRACTION W/PHACO Left 12/11/2019   Procedure: CATARACT EXTRACTION PHACO AND INTRAOCULAR LENS PLACEMENT ([place]) LEFT DIABETIC MALYUGIN;  Surgeon: Birder Robson, MD;  Location: [place];  Service: Ophthalmology;  Laterality: Left;  CDE 11.45 U/S  1:05.7  . CATARACT EXTRACTION W/PHACO Right 01/08/2020   Procedure: CATARACT EXTRACTION PHACO AND INTRAOCULAR LENS PLACEMENT ([facility]) RIGHT DIABETIC 5.75  00:49.3;  Surgeon: Birder Robson, MD;  Location: [place];  Service: Ophthalmology;  Laterality: Right;  Diabetic  . laparscopic chole    . SPINE SURGERY     cervical spine surgery  . trigeminal neuralgia     with surgery in [place] History:   reports that he has been smoking cigarettes. He has a 90.00  pack-year smoking history. He uses smokeless tobacco. He reports previous alcohol use. He reports that he does not use drugs.  No Known Allergies  Family History  Problem Relation Age of Onset  . Cancer Mother        died @ 62.  [name] COPD Father        died @ 47.  . Diabetes Brother        died in early 64's.     Prior to Admission medications   Medication Sig Start Date End Date Taking? Authorizing Provider  amiodarone (PACERONE) 200 MG tablet Take 1 tablet by mouth two times a day for one week, then take 1 tablet by mouth  once daily. 06/30/20  Yes Agbor-Etang, Aaron Edelman, MD  apixaban (ELIQUIS) 5 MG TABS tablet Take 1 tablet (5 mg total) by mouth 2 (two) times daily. 09/10/20  Yes Leone Haven, MD  blood glucose meter kit and supplies KIT Dispense based on patient and insurance preference. Use up to four times daily as directed. (FOR ICD-9 250.00, 250.01). 09/16/20  Yes Leone Haven, MD  latanoprost (XALATAN) 0.005 % ophthalmic solution INSTILL 1 DROP INTO EACH EYE AT BEDTIME 07/18/19  Yes [provider]  lisinopril (ZESTRIL) 20 MG tablet Take 1 tablet (20 mg total) by mouth daily. 09/10/20  Yes Leone Haven, MD  metFORMIN (GLUCOPHAGE XR) 500 MG 24 hr tablet Take 1 tablet (500 mg total) by mouth in the morning and at bedtime. 09/10/20  Yes Leone Haven, MD  ONE TOUCH ULTRA TEST test strip USE TEST STRIP TO CHECK GLUCOSE AT LEAST ONCE DAILY 05/06/16  Yes Thersa Salt G, DO  rosuvastatin (CRESTOR) 20 MG tablet Take 1 tablet by mouth once daily 08/05/20  Yes Leone Haven, MD  tamsulosin [facility]) 0.4 MG CAPS capsule Take 2 capsules by mouth once daily 12/05/19  Yes Leone Haven, MD  vitamin B-12 (CYANOCOBALAMIN) 500 MCG tablet Take 500 mcg by mouth daily.   Yes [provider]    Physical Exam: Vitals:   10/06/20 1837 10/06/20 2001 10/07/20 0054 10/07/20 0102  BP: (!) 177/87 112/82 (!) 208/58   Pulse: (!) 55 (!) 58 (!) 34 (!) 55  Resp:  18 17   Temp: 98 F (36.7 C)     TempSrc: Oral     SpO2: 98% 99% 97% 97%  Weight:      Height:        Constitutional: NAD, calm  Eyes: PERTLA, lids and conjunctivae normal ENMT: Mucous membranes are moist. Posterior pharynx clear of any exudate or lesions.   Neck: supple, no masses  Respiratory: no wheezing, no crackles. No accessory muscle use.  Cardiovascular: S1 & S2 heard, regular rate and rhythm. No extremity edema.   Abdomen: No distension, no tenderness, soft. Bowel sounds active.  Musculoskeletal: no clubbing / cyanosis.  No joint deformity upper and lower extremities.   Skin: no significant rashes, lesions, ulcers. Warm, dry, well-perfused. Neurologic: CN 2-12 grossly intact. Sensation intact. Moving all extremities.  Psychiatric: Alert and oriented to person, place, and situation. Pleasant and cooperative.    Labs and Imaging on Admission: I have personally reviewed following labs and imaging studies  CBC: Recent Labs  Lab 10/04/20 1627 10/06/20 1857  WBC 7.4 10.0  HGB 13.8 14.2  HCT 41.2 41.5  MCV 92.0 91.2  PLT 144* 709   Basic Metabolic Panel: Recent Labs  Lab 10/04/20 1627 10/06/20 1857  NA 136 137  K 3.6 4.3  CL 99 100  CO2 28 27  GLUCOSE 254* 223*  BUN 15 19  CREATININE 1.34* 1.46*  CALCIUM 8.8* 9.5   GFR: Estimated Creatinine Clearance: 42.8 mL/min (A) (by C-G formula based on SCr of 1.46 mg/dL (H)). Liver Function Tests: No results for input(s): AST, ALT, ALKPHOS, BILITOT, PROT, ALBUMIN in the last 168 hours. No results for input(s): LIPASE, AMYLASE in the last 168 hours. No results for input(s): AMMONIA in the last 168 hours. Coagulation Profile: No results for input(s): INR, PROTIME in the last 168 hours. Cardiac Enzymes: No results for input(s): CKTOTAL, CKMB, CKMBINDEX, TROPONINI in the last 168 hours. BNP (last 3 results) No results for input(s): PROBNP in the last 8760 hours. HbA1C: No results for input(s): HGBA1C in the last 72 hours. CBG: No results for input(s): GLUCAP in the last 168 hours. Lipid Profile: No results for input(s): CHOL, HDL, LDLCALC, TRIG, CHOLHDL, LDLDIRECT in the last 72 hours. Thyroid Function Tests: No results for input(s): TSH, T4TOTAL, FREET4, T3FREE, THYROIDAB in the last 72 hours. Anemia Panel: No results for input(s): VITAMINB12, FOLATE, FERRITIN, TIBC, IRON, RETICCTPCT in the last 72 hours. Urine analysis:    Component Value Date/Time   COLORURINE STRAW (A) 10/04/2020 1917   APPEARANCEUR CLEAR (A) 10/04/2020 1917   APPEARANCEUR  Clear 08/05/2014 2214   LABSPEC 1.006 10/04/2020 1917   LABSPEC 1.012 08/05/2014 2214   PHURINE 8.0 10/04/2020 1917   GLUCOSEU >=500 (A) 10/04/2020 1917   GLUCOSEU Negative 08/05/2014 2214   GLUCOSEU NEG mg/dL 02/02/2010 1815   HGBUR SMALL (A) 10/04/2020 1917   BILIRUBINUR NEGATIVE 10/04/2020 1917   BILIRUBINUR Negative 08/05/2014 2214   [place] 10/04/2020 1917   PROTEINUR NEGATIVE 10/04/2020 1917   UROBILINOGEN 0.2 02/02/2010 1815   NITRITE NEGATIVE 10/04/2020 1917   LEUKOCYTESUR TRACE (A) 10/04/2020 1917   LEUKOCYTESUR Negative 08/05/2014 2214   Sepsis Labs: _0 (procalcitonin:4,lacticidven:4) ) Recent Results (from the past 240 hour(s))  Resp Panel by RT-PCR (Flu A&B, Covid) Nasopharyngeal Swab     Status: None   Collection Time: 10/07/20 12:45 AM   Specimen: Nasopharyngeal Swab; Nasopharyngeal(NP) swabs in vial transport medium  Result Value Ref Range Status   SARS Coronavirus 2 by RT PCR NEGATIVE NEGATIVE Final    Comment: (NOTE) SARS-CoV-2 target nucleic acids are NOT DETECTED.  The SARS-CoV-2 RNA is generally detectable in upper respiratory specimens during the acute phase of infection. The lowest concentration of SARS-CoV-2 viral copies this assay can detect is 138 copies/mL. A negative result does not preclude SARS-Cov-2 infection and should not be used as the sole basis for treatment or other patient management decisions. A negative result may occur with  improper specimen collection/handling, submission of specimen other than nasopharyngeal swab, presence of viral mutation(s) within the areas targeted by this assay, and inadequate number of viral copies(<138 copies/mL). A negative result must be combined with clinical observations, patient history, and epidemiological information. The expected result is Negative.  Fact Sheet for Patients:  EntrepreneurPulse.com.au  Fact Sheet for Healthcare Providers:   IncredibleEmployment.be  This test is no t yet approved or cleared by the [place] FDA and  has been authorized for detection and/or diagnosis of SARS-CoV-2 by FDA under an Emergency Use Authorization (EUA). This EUA will remain  in effect (meaning this test can be used) for the duration of the COVID-19 declaration under Section 564(b)(1) of the Act, 21 U.S.C.section 360bbb-3(b)(1), unless the authorization is terminated  or revoked sooner.       Influenza A  by PCR NEGATIVE NEGATIVE Final   Influenza B by PCR NEGATIVE NEGATIVE Final    Comment: (NOTE) The Xpert Xpress SARS-CoV-2/FLU/RSV plus assay is intended as an aid in the diagnosis of influenza from Nasopharyngeal swab specimens and should not be used as a sole basis for treatment. Nasal washings and aspirates are unacceptable for Xpert Xpress SARS-CoV-2/FLU/RSV testing.  Fact Sheet for Patients: EntrepreneurPulse.com.au  Fact Sheet for Healthcare Providers: IncredibleEmployment.be  This test is not yet approved or cleared by the [place] FDA and has been authorized for detection and/or diagnosis of SARS-CoV-2 by FDA under an Emergency Use Authorization (EUA). This EUA will remain in effect (meaning this test can be used) for the duration of the COVID-19 declaration under Section 564(b)(1) of the Act, 21 U.S.C. section 360bbb-3(b)(1), unless the authorization is terminated or revoked.  Performed at [facility], [address] [place], [place] [address]      Radiological Exams on Admission: CT Head Wo Contrast  Result Date: 10/06/2020 CLINICAL DATA:  Passing out and hitting head.  Nausea. EXAM: CT HEAD WITHOUT CONTRAST CT CERVICAL SPINE WITHOUT CONTRAST TECHNIQUE: Multidetector CT imaging of the head and cervical spine was performed following the standard protocol without intravenous contrast. Multiplanar CT image reconstructions of the  cervical spine were also generated. COMPARISON:  CT head 02/04/2008. FINDINGS: CT HEAD FINDINGS Brain: Patchy and confluent areas of decreased attenuation are noted throughout the deep and periventricular white matter of the cerebral hemispheres bilaterally, compatible with chronic microvascular ischemic disease. Redemonstration of left cerebellar hypodensity. No evidence of large-territorial acute infarction. No parenchymal hemorrhage. No mass lesion. No extra-axial collection. No mass effect or midline shift. No hydrocephalus. Basilar cisterns are patent. Vascular: No hyperdense vessel. Skull: No acute fracture or focal lesion. Prior left sub-occipital craniotomy. Sinuses/Orbits: Paranasal sinuses and mastoid air cells are clear. The orbits are unremarkable. Other: None. CT CERVICAL SPINE FINDINGS Alignment: Normal. Skull base and vertebrae: Surgical changes related to anterior C4 through C6 fusion. No definite hardware fracture or surrounding lucency. No prior images to compare to. No acute displaced fracture. No aggressive appearing focal osseous lesion or focal pathologic process. Soft tissues and spinal canal: No prevertebral fluid or swelling. No visible canal hematoma. Disc levels:  Maintained. Upper chest: Right apical paraseptal emphysematous changes as well as/pulmonary scarring. Other: None. IMPRESSION: 1. No acute intracranial abnormality. 2. No acute fracture or traumatic listhesis of the cervical spine. Electronically Signed   By: Iven Finn M.D.   On: 10/06/2020 23:25   CT Cervical Spine Wo Contrast  Result Date: 10/06/2020 CLINICAL DATA:  Passing out and hitting head.  Nausea. EXAM: CT HEAD WITHOUT CONTRAST CT CERVICAL SPINE WITHOUT CONTRAST TECHNIQUE: Multidetector CT imaging of the head and cervical spine was performed following the standard protocol without intravenous contrast. Multiplanar CT image reconstructions of the cervical spine were also generated. COMPARISON:  CT head  02/04/2008. FINDINGS: CT HEAD FINDINGS Brain: Patchy and confluent areas of decreased attenuation are noted throughout the deep and periventricular white matter of the cerebral hemispheres bilaterally, compatible with chronic microvascular ischemic disease. Redemonstration of left cerebellar hypodensity. No evidence of large-territorial acute infarction. No parenchymal hemorrhage. No mass lesion. No extra-axial collection. No mass effect or midline shift. No hydrocephalus. Basilar cisterns are patent. Vascular: No hyperdense vessel. Skull: No acute fracture or focal lesion. Prior left sub-occipital craniotomy. Sinuses/Orbits: Paranasal sinuses and mastoid air cells are clear. The orbits are unremarkable. Other: None. CT CERVICAL SPINE FINDINGS Alignment: Normal. Skull base and  vertebrae: Surgical changes related to anterior C4 through C6 fusion. No definite hardware fracture or surrounding lucency. No prior images to compare to. No acute displaced fracture. No aggressive appearing focal osseous lesion or focal pathologic process. Soft tissues and spinal canal: No prevertebral fluid or swelling. No visible canal hematoma. Disc levels:  Maintained. Upper chest: Right apical paraseptal emphysematous changes as well as/pulmonary scarring. Other: None. IMPRESSION: 1. No acute intracranial abnormality. 2. No acute fracture or traumatic listhesis of the cervical spine. Electronically Signed   By: Iven Finn M.D.   On: 10/06/2020 23:25    EKG: Independently reviewed. Sinus rhythm, rate 55, 1st degree AV block.   Assessment/Plan   1. Syncope; suspected intermittent heart block  - Presents with recurrent syncope, occurred while seated and associated with nausea and fatigue but no chest pain, was in SB with rate 50s and 1st deg AV block initially but later seen to drop rate to low 30s and appeared to be complete heart block per ED physician  - Plan to keep pacer at bedside, continue cardiac monitoring, hold  amiodarone for now, hold Eliquis, start IV heparin, and consult with cardiology   2. Atrial fibrillation  - In sinus rhythm on admission  - CHADS-VASc at least 3 (age x2, DM)  - On Eliquis pta, held on admission should he need procedure, started on IV heparin for now   3. CKD IIIa  - SCr is 1.46 on admission, similar to priors  - Renally-dose medications, monitor    4. Type II DM  - A1c was 9.6% in August 2021  - Check CBGs and use low-intensity SSI for now     DVT prophylaxis: Eliquis pta, IV heparin for now  Code Status: Full  Family Communication: Discussed with patient  Disposition Plan:  Patient is from: home  Anticipated d/c is to: TBD Anticipated d/c date is: 10/09/20 Patient currently: Pending further evaluation of recurrent syncope, suspected intermittent complete heart block   Consults called: Cardiology  Admission status: Inpatient     Vianne Bulls, MD [facility]  10/07/2020, 2:43 AM

## 2020-10-07 NOTE — ED Notes (Signed)
Pt assisted to bedside commode at this time

## 2020-10-07 NOTE — ED Notes (Signed)
Pt assisted back to bed. Pt did not have a BM at this time. Will leave commode at bedside

## 2020-10-07 NOTE — ED Notes (Signed)
Called dietary at this time, will send tray for pt at this time.

## 2020-10-07 NOTE — Progress Notes (Signed)
ANTICOAGULATION CONSULT NOTE - Initial Consult  Pharmacy Consult for Heparin (was on Eliquis) Indication: atrial fibrillation  No Known Allergies  Patient Measurements: Height: 6' (182.9 cm) Weight: 79.8 kg (176 lb) IBW/kg (Calculated) : 77.6 HEPARIN DW (KG): 79.8  Vital Signs: Temp: 98 F (36.7 C) (12/20 1837) Temp Source: Oral (12/20 1837) BP: 186/70 (12/21 0530) Pulse Rate: 50 (12/21 0530)  Labs: Recent Labs    10/04/20 1627 10/04/20 1824 10/06/20 1857 10/07/20 0515  HGB 13.8  --  14.2 12.8*  HCT 41.2  --  41.5 38.1*  PLT 144*  --  155 142*  CREATININE 1.34*  --  1.46*  --   TROPONINIHS  --  14  --   --     Estimated Creatinine Clearance: 42.8 mL/min (A) (by C-G formula based on SCr of 1.46 mg/dL (H)).   Medical History: Past Medical History:  Diagnosis Date  . Anxiety   . BPH (benign prostatic hyperplasia)   . Chronic fatigue   . CKD (chronic kidney disease), stage III ([place])   . Claudication [facility])    a. R Hip.  [name] COPD (chronic obstructive pulmonary disease) ([place])   . Dyspnea    with exertion  . Essential hypertension    a. 01/2008 Echo: EF 65%, no rwma, mod increased PASP.  [name] Hypercholesterolemia   . Osteoarthritis   . Paget's disease   . Syncope    a. 03/2015 in setting of hypotension.  . Tobacco abuse    a. 1-1.5 ppd x 60+ yrs.  . Type II diabetes mellitus ([place])   . Wears partial dentures    uppers    Medications:  (Not in a hospital admission)   Assessment: Was on Eliquis PTA.  Baseline labs pending.  Goal of Therapy:  APTT 66-102 Monitor platelets by anticoagulation protocol: Yes   Plan:  Heparin infusion at 1100 units/hr Check aPTT ~ 8 hours after infusion started  Hart Robinsons A 10/07/2020,5:42 AM

## 2020-10-07 NOTE — ED Notes (Signed)
Meal tray delivered to pt at this time.  

## 2020-10-07 NOTE — ED Notes (Signed)
Pt assisted to commode at this time

## 2020-10-07 NOTE — Progress Notes (Signed)
*  PRELIMINARY RESULTS* Echocardiogram 2D Echocardiogram has been performed.  Sherrie Sport 10/07/2020, 10:06 AM

## 2020-10-07 NOTE — ED Notes (Signed)
Pt moved from 19H to room 1.  Defib pads placed on pt and pt hooked up to zoll monitor at this time.

## 2020-10-08 ENCOUNTER — Encounter
Admission: EM | Disposition: A | Discharge status: Home / Self Care | Payer: Self-pay | Source: Home / Self Care | Attending: Hospitalist

## 2020-10-08 ENCOUNTER — Encounter: Payer: Self-pay | Admitting: Family Medicine

## 2020-10-08 ENCOUNTER — Inpatient Hospital Stay: Payer: Medicare Other

## 2020-10-08 ENCOUNTER — Encounter: Payer: Self-pay | Admitting: Cardiology

## 2020-10-08 ENCOUNTER — Telehealth: Payer: Self-pay

## 2020-10-08 DIAGNOSIS — I495 Sick sinus syndrome: Secondary | ICD-10-CM

## 2020-10-08 DIAGNOSIS — J432 Centrilobular emphysema: Secondary | ICD-10-CM

## 2020-10-08 HISTORY — PX: PACEMAKER IMPLANT: EP1218

## 2020-10-08 LAB — CBC
HCT: 36.5 % — ABNORMAL LOW (ref 39.0–52.0)
Hemoglobin: 12.4 g/dL — ABNORMAL LOW (ref 13.0–17.0)
MCH: 30.4 pg (ref 26.0–34.0)
MCHC: 34 g/dL (ref 30.0–36.0)
MCV: 89.5 fL (ref 80.0–100.0)
Platelets: 146 10*3/uL — ABNORMAL LOW (ref 150–400)
RBC: 4.08 MIL/uL — ABNORMAL LOW (ref 4.22–5.81)
RDW: 12.8 % (ref 11.5–15.5)
WBC: 7.5 10*3/uL (ref 4.0–10.5)
nRBC: 0 % (ref 0.0–0.2)

## 2020-10-08 LAB — PACEMAKER DEVICE OBSERVATION

## 2020-10-08 LAB — GLUCOSE, CAPILLARY
Glucose-Capillary: 114 mg/dL — ABNORMAL HIGH (ref 70–99)
Glucose-Capillary: 122 mg/dL — ABNORMAL HIGH (ref 70–99)
Glucose-Capillary: 185 mg/dL — ABNORMAL HIGH (ref 70–99)

## 2020-10-08 LAB — BASIC METABOLIC PANEL
Anion gap: 10 (ref 5–15)
BUN: 21 mg/dL (ref 8–23)
CO2: 24 mmol/L (ref 22–32)
Calcium: 8.6 mg/dL — ABNORMAL LOW (ref 8.9–10.3)
Chloride: 102 mmol/L (ref 98–111)
Creatinine, Ser: 1.41 mg/dL — ABNORMAL HIGH (ref 0.61–1.24)
GFR, Estimated: 50 mL/min — ABNORMAL LOW (ref >60)
Glucose, Bld: 95 mg/dL (ref 70–99)
Potassium: 3.5 mmol/L (ref 3.5–5.1)
Sodium: 136 mmol/L (ref 135–145)

## 2020-10-08 LAB — CBG MONITORING, ED
Glucose-Capillary: 174 mg/dL — ABNORMAL HIGH (ref 70–99)
Glucose-Capillary: 91 mg/dL (ref 70–99)

## 2020-10-08 LAB — MAGNESIUM: Magnesium: 1.8 mg/dL (ref 1.7–2.4)

## 2020-10-08 SURGERY — PACEMAKER IMPLANT
Anesthesia: Moderate Sedation

## 2020-10-08 MED ORDER — HYDRALAZINE HCL 20 MG/ML IJ SOLN
INTRAMUSCULAR | Status: AC
  Filled 2020-10-08: qty 1

## 2020-10-08 MED ORDER — CHLORHEXIDINE GLUCONATE 4 % EX LIQD: 60.0000 mL | Freq: Once | CUTANEOUS | Status: DC

## 2020-10-08 MED ORDER — MIDAZOLAM HCL 2 MG/2ML IJ SOLN
INTRAMUSCULAR | Status: AC
  Filled 2020-10-08: qty 2

## 2020-10-08 MED ORDER — CEFAZOLIN SODIUM-DEXTROSE 2-4 GM/100ML-% IV SOLN
INTRAVENOUS | Status: AC
  Filled 2020-10-08: qty 100

## 2020-10-08 MED ORDER — ONDANSETRON HCL 4 MG/2ML IJ SOLN: 4.0000 mg | Freq: Four times a day (QID) | INTRAMUSCULAR | Status: DC | PRN

## 2020-10-08 MED ORDER — LIDOCAINE HCL (PF) 1 % IJ SOLN
INTRAMUSCULAR | Status: AC
  Filled 2020-10-08: qty 30

## 2020-10-08 MED ORDER — LIDOCAINE HCL (PF) 1 % IJ SOLN
INTRAMUSCULAR | Status: DC | PRN
  Administered 2020-10-08: 40 mL

## 2020-10-08 MED ORDER — HEPARIN (PORCINE) IN NACL 1000-0.9 UT/500ML-% IV SOLN
INTRAVENOUS | Status: AC
  Filled 2020-10-08: qty 1000

## 2020-10-08 MED ORDER — CEFAZOLIN SODIUM-DEXTROSE 2-4 GM/100ML-% IV SOLN
2.0000 g | INTRAVENOUS | Status: AC
  Administered 2020-10-08: 2 g via INTRAVENOUS
  Filled 2020-10-08: qty 100

## 2020-10-08 MED ORDER — FENTANYL CITRATE (PF) 100 MCG/2ML IJ SOLN
INTRAMUSCULAR | Status: AC
  Filled 2020-10-08: qty 2

## 2020-10-08 MED ORDER — SODIUM CHLORIDE 0.9 % IV SOLN
80.0000 mg | INTRAVENOUS | Status: AC
  Administered 2020-10-08: 80 mg
  Filled 2020-10-08: qty 80

## 2020-10-08 MED ORDER — FENTANYL CITRATE (PF) 100 MCG/2ML IJ SOLN
INTRAMUSCULAR | Status: DC | PRN
  Administered 2020-10-08: 25 ug via INTRAVENOUS

## 2020-10-08 MED ORDER — SODIUM CHLORIDE 0.9 % IV SOLN: INTRAVENOUS | Status: DC

## 2020-10-08 MED ORDER — ACETAMINOPHEN 325 MG PO TABS: 325.0000 mg | ORAL_TABLET | ORAL | Status: DC | PRN

## 2020-10-08 MED ORDER — MIDAZOLAM HCL 2 MG/2ML IJ SOLN
INTRAMUSCULAR | Status: DC | PRN
  Administered 2020-10-08: 1 mg via INTRAVENOUS

## 2020-10-08 SURGICAL SUPPLY — 10 items
CABLE SURG 12 DISP A/V CHANNEL (MISCELLANEOUS) ×1 IMPLANT
DEVICE DSSCT PLSMBLD 3.0S LGHT (MISCELLANEOUS) IMPLANT
LEAD TENDRIL MRI 52CM LPA1200M (Lead) ×1 IMPLANT
LEAD TENDRIL MRI 58CM LPA1200M (Lead) ×1 IMPLANT
PACEMAKER ASSURITY DR-RF (Pacemaker) ×1 IMPLANT
PACK PACE INSERTION (MISCELLANEOUS) ×2 IMPLANT
PAD ELECT DEFIB RADIOL ZOLL (MISCELLANEOUS) ×2 IMPLANT
PLASMABLADE 3.0S W/LIGHT (MISCELLANEOUS) ×2
SHEATH CLASSIC 8F (SHEATH) ×2 IMPLANT
SLING ARM IMMOBILIZER LRG (SOFTGOODS) ×1 IMPLANT

## 2020-10-08 NOTE — Telephone Encounter (Signed)
[place] scheduled hospital follow up on 10/20/20 with Dr. Caryl Bis.

## 2020-10-08 NOTE — Telephone Encounter (Signed)
Noted  

## 2020-10-08 NOTE — Discharge Instructions (Signed)
After Your Pacemaker    You have a [employer] Pacemaker  ACTIVITY  Do not lift your arm above shoulder height for 1 week after your procedure. After 7 days, you may progress as below.   No sleeping on your left side for the next 8 weeks. If possible, please sleep in a recliner.  You should remove your sling 24 hours after your procedure, unless otherwise instructed by your provider.     Wednesday October 15, 2020  Thursday October 16, 2020 Friday October 17, 2020 Saturday October 18, 2020    Do not lift, push, pull, or carry anything over 10 pounds with the affected arm until 8 weeks (Wednesday December 03, 2020 ) after your procedure.    Do NOT DRIVE until you have been seen for your wound check, or as long as instructed by your healthcare provider.     INCISION/Dressing  Please hold your Eliquis for the next 5 days. Your Eliquis can be restarted 10/13/20.   If large square, outer bandage is left in place, this can be removed after 24 hours from your procedure. Do not remove steri-strips or glue as below.    Please keep the area dry for 1 week.   Monitor your Pacemaker site for redness, swelling, and drainage. Call the device clinic at [phone] if you experience these symptoms or fever/chills.    Avoid lotions, ointments, or perfumes over your incision until it is well-healed.   You may use a hot tub or a pool AFTER your wound check appointment if the incision is completely closed.   PAcemaker Alerts:  Some alerts are vibratory and others beep. These are NOT emergencies. Please call our office to let us know. If this occurs at night or on weekends, it can wait until the next business day. Send a remote transmission.   If your device is capable of reading fluid status (for heart failure), you will be offered monthly monitoring to review this with you.   DEVICE MANAGEMENT  Our office will reach out to you regarding follow-up with the device clinic. You should  follow-up in the device clinic in [place] in 7 to 10 days.   Remote monitoring is used to monitor your pacemaker from home. This monitoring is scheduled every 91 days by our office. It allows [place] to keep an eye on the functioning of your device to ensure it is working properly. You will routinely see your Electrophysiologist annually (more often if necessary).    You should receive your ID card for your new device in 4-8 weeks. Keep this card with you at all times once received. Consider wearing a medical alert bracelet or necklace.   Your Pacemaker may be MRI compatible. This will be discussed at your next office visit/wound check.  You should avoid contact with strong electric or magnetic fields.    Do not use amateur (ham) radio equipment or electric (arc) welding torches. MP3 player headphones with magnets should not be used. Some devices are safe to use if held at least 12 inches (30 cm) from your Pacemaker. These include power tools, lawn mowers, and speakers. If you are unsure if something is safe to use, ask your health care provider.   When using your cell phone, hold it to the ear that is on the opposite side from the Pacemaker. Do not leave your cell phone in a pocket over the Pacemaker.   You may safely use electric blankets, heating pads, computers, and microwave ovens.  Call  the office right away if:  You have chest pain.  You feel more short of breath than you have felt before.  You feel more light-headed than you have felt before.  Your incision starts to open up.  This information is not intended to replace advice given to you by your health care provider. Make sure you discuss any questions you have with your health care provider.

## 2020-10-08 NOTE — Consult Note (Signed)
Electrophysiology Consultation:   Patient ID: Zachary Santos MRN: [id number]; DOB: 01-Apr-1936  Admit date: 10/06/2020 Date of Consult: 10/08/2020  Primary Care Provider: Leone Haven, MD [facility] Cardiologist: Kate Sable, MD  [facility] Electrophysiologist:  Vickie Epley, MD    Patient Profile:   Zachary Santos is a 84 y.o. male with a hx of hypertension, DM2, paroxysmal atrial fibrillation on Eliquis and amiodarone, history of syncope tracing back to 2016, COPD, current smoker x70 years, who is being seen today for the evaluation of syncope and transient third-degree block at the request of Dr. Billie Ruddy.  History of Present Illness:   Zachary Santos is an 84 yo man with hx of CKD, COPD, tobacco abuse and recent syncopal episodes who presented to the ER on 10/06/2020 after a syncopal episode while seated at home. He was watching TV and suddenly lost consciousness and struck his head. He woke up on the floor with his wife above him. In the ER, patient was noted to have intermittent 2:1 AVB with ventricular rates in the mid 62s. He is not taking any cardiac medications at this time.  Past Medical History:  Diagnosis Date  . Anxiety   . BPH (benign prostatic hyperplasia)   . Chronic fatigue   . CKD (chronic kidney disease), stage III ([place])   . Claudication [facility])    a. R Hip.  [name] COPD (chronic obstructive pulmonary disease) ([place])   . Dyspnea    with exertion  . Essential hypertension    a. 01/2008 Echo: EF 65%, no rwma, mod increased PASP.  [name] Hypercholesterolemia   . Osteoarthritis   . Paget's disease   . Syncope    a. 03/2015 in setting of hypotension.  . Tobacco abuse    a. 1-1.5 ppd x 60+ yrs.  . Type II diabetes mellitus ([place])   . Wears partial dentures    uppers    Past Surgical History:  Procedure Laterality Date  . APPENDECTOMY    . CATARACT EXTRACTION W/PHACO Left 12/11/2019   Procedure: CATARACT EXTRACTION PHACO AND INTRAOCULAR LENS PLACEMENT ([place])  LEFT DIABETIC MALYUGIN;  Surgeon: Birder Robson, MD;  Location: [place];  Service: Ophthalmology;  Laterality: Left;  CDE 11.45 U/S  1:05.7  . CATARACT EXTRACTION W/PHACO Right 01/08/2020   Procedure: CATARACT EXTRACTION PHACO AND INTRAOCULAR LENS PLACEMENT ([facility]) RIGHT DIABETIC 5.75  00:49.3;  Surgeon: Birder Robson, MD;  Location: [place];  Service: Ophthalmology;  Laterality: Right;  Diabetic  . laparscopic chole    . SPINE SURGERY     cervical spine surgery  . trigeminal neuralgia     with surgery in [place] Medications:  Prior to Admission medications   Medication Sig Start Date End Date Taking? Authorizing Provider  amiodarone (PACERONE) 200 MG tablet Take 1 tablet by mouth two times a day for one week, then take 1 tablet by mouth once daily. 06/30/20  Yes Agbor-Etang, Aaron Edelman, MD  apixaban (ELIQUIS) 5 MG TABS tablet Take 1 tablet (5 mg total) by mouth 2 (two) times daily. 09/10/20  Yes Leone Haven, MD  blood glucose meter kit and supplies KIT Dispense based on patient and insurance preference. Use up to four times daily as directed. (FOR ICD-9 250.00, 250.01). 09/16/20  Yes Leone Haven, MD  latanoprost (XALATAN) 0.005 % ophthalmic solution INSTILL 1 DROP INTO EACH EYE AT BEDTIME 07/18/19  Yes [provider]  lisinopril (ZESTRIL) 20 MG tablet Take 1  tablet (20 mg total) by mouth daily. 09/10/20  Yes Leone Haven, MD  metFORMIN (GLUCOPHAGE XR) 500 MG 24 hr tablet Take 1 tablet (500 mg total) by mouth in the morning and at bedtime. 09/10/20  Yes Leone Haven, MD  ONE TOUCH ULTRA TEST test strip USE TEST STRIP TO CHECK GLUCOSE AT LEAST ONCE DAILY 05/06/16  Yes Thersa Salt G, DO  rosuvastatin (CRESTOR) 20 MG tablet Take 1 tablet by mouth once daily 08/05/20  Yes Leone Haven, MD  tamsulosin [facility]) 0.4 MG CAPS capsule Take 2 capsules by mouth once daily 12/05/19  Yes Leone Haven, MD  vitamin B-12  (CYANOCOBALAMIN) 500 MCG tablet Take 500 mcg by mouth daily.   Yes [provider]    Inpatient Medications: Scheduled Meds: . chlorhexidine  60 mL Topical Once  . chlorhexidine  60 mL Topical Once  . gentamicin irrigation  80 mg Irrigation On Call  . insulin aspart  0-9 Units Subcutaneous Q4H  . lisinopril  40 mg Oral Daily  . rosuvastatin  20 mg Oral q1800  . sodium chloride flush  3 mL Intravenous Q12H  . sodium chloride flush  3 mL Intravenous Q12H  . tamsulosin  0.8 mg Oral Daily   Continuous Infusions: . sodium chloride    . sodium chloride    .  ceFAZolin (ANCEF) IV     PRN Meds: sodium chloride, acetaminophen **OR** acetaminophen, atropine, hydrALAZINE, HYDROcodone-acetaminophen, ondansetron **OR** ondansetron (ZOFRAN) IV, senna-docusate, sodium chloride flush  Allergies:   No Known Allergies  Social History:   Social History   Socioeconomic History  . Marital status: Married    Spouse name: Not on file  . Number of children: Not on file  . Years of education: Not on file  . Highest education level: Not on file  Occupational History  . Not on file  Tobacco Use  . Smoking status: Current Every Day Smoker    Packs/day: 1.50    Years: 60.00    Pack years: 90.00    Types: Cigarettes  . Smokeless tobacco: Current User  Substance and Sexual Activity  . Alcohol use: Not Currently    Alcohol/week: 0.0 standard drinks    Comment: rare - "1% of the time."  . Drug use: No  . Sexual activity: Not on file  Other Topics Concern  . Not on file  Social History Narrative   Lives in [place] with wife.  Retired Administrator.  Very sedentary.  Avoids activity b/c he "gives out" and is limited by right hip/buttock discomfort/claudication.   Social Determinants of Health   Financial Resource Strain: Medium Risk  . Difficulty of Paying Living Expenses: Somewhat hard  Food Insecurity: No Food Insecurity  . Worried About Charity fundraiser in the Last Year:  Never true  . Ran Out of Food in the Last Year: Never true  Transportation Needs: No Transportation Needs  . Lack of Transportation (Medical): No  . Lack of Transportation (Non-Medical): No  Physical Activity: Not on file  Stress: No Stress Concern Present  . Feeling of Stress : Only a little  Social Connections: Unknown  . Frequency of Communication with Friends and Family: More than three times a week  . Frequency of Social Gatherings with Friends and Family: Once a week  . Attends Religious Services: Not on file  . Active Member of Clubs or Organizations: Not on file  . Attends Archivist Meetings: Not on file  . Marital  Status: Married  Human resources officer Violence: Not At Risk  . Fear of Current or Ex-Partner: No  . Emotionally Abused: No  . Physically Abused: No  . Sexually Abused: No    Family History:    Family History  Problem Relation Age of Onset  . Cancer Mother        died @ 3.  [name] COPD Father        died @ 70.  . Diabetes Brother        died in early 74's.     ROS:  Please see the history of present illness.   All other ROS reviewed and negative.     Physical Exam/Data:   Vitals:   10/07/20 2234 10/07/20 2235 10/08/20 0000 10/08/20 0540  BP:  (!) 156/58 (!) 162/63 (!) 144/54  Pulse: 61 60  (!) 53  Resp: (!) '21 17 14 16  ' Temp:      TempSrc:      SpO2: 97% 96%  96%  Weight:      Height:       No intake or output data in the 24 hours ending 10/08/20 0704 Last 3 Weights 10/06/2020 10/04/2020 08/18/2020  Weight (lbs) 176 lb 176 lb 185 lb  Weight (kg) 79.833 kg 79.833 kg 83.915 kg     Body mass index is 23.87 kg/m.   General:  Well nourished, well developed, in no acute distress. HEENT: normal Lymph: no adenopathy Neck: no JVD Endocrine:  No thryomegaly Vascular: No carotid bruits; FA pulses 2+ bilaterally without bruits  Cardiac:  normal S1, S2; RRR; no murmur  Lungs:  clear to auscultation bilaterally, no wheezing, rhonchi or rales   Abd: soft, nontender, no hepatomegaly  Ext: no edema Musculoskeletal:  No deformities, BUE and BLE strength normal and equal Skin: warm and dry  Neuro:  CNs 2-12 intact, no focal abnormalities noted Psych:  Normal affect   EKG:  The EKG was personally reviewed and demonstrates:  2:1 AVB on 1 ECG and on another he has 1:1 conduction.    Relevant CV Studies:  10/07/2020 TTE personally reviewed 1. Left ventricular ejection fraction, by estimation, is 55 to 60%. The  left ventricle has normal function. The left ventricle has no regional  wall motion abnormalities. There is mild left ventricular hypertrophy.  Left ventricular diastolic parameters  were normal.  2. Right ventricular systolic function is normal. The right ventricular  size is normal. There is normal pulmonary artery systolic pressure.  3. The mitral valve is normal in structure. No evidence of mitral valve  regurgitation. No evidence of mitral stenosis.  4. The aortic valve is normal in structure. Aortic valve regurgitation is  not visualized. No aortic stenosis is present.  5. The inferior vena cava is normal in size with greater than 50%  respiratory variability, suggesting right atrial pressure of 3 mmHg.      Laboratory Data:  High Sensitivity Troponin:   Recent Labs  Lab 10/04/20 1824  TROPONINIHS 14     Chemistry Recent Labs  Lab 10/06/20 1857 10/07/20 0515 10/08/20 0538  NA 137 136 136  K 4.3 3.8 3.5  CL 100 101 102  CO2 '27 26 24  ' GLUCOSE 223* 168* 95  BUN '19 20 21  ' CREATININE 1.46* 1.49* 1.41*  CALCIUM 9.5 8.9 8.6*  GFRNONAA 48* 47* 50*  ANIONGAP '10 9 10    ' No results for input(s): PROT, ALBUMIN, AST, ALT, ALKPHOS, BILITOT in the last 168 hours. Hematology Recent Labs  Lab 10/06/20 1857 10/07/20 0515 10/08/20 0538  WBC 10.0 8.1 7.5  RBC 4.55 4.18* 4.08*  HGB 14.2 12.8* 12.4*  HCT 41.5 38.1* 36.5*  MCV 91.2 91.1 89.5  MCH 31.2 30.6 30.4  MCHC 34.2 33.6 34.0  RDW 12.9 12.8  12.8  PLT 155 142* 146*   BNPNo results for input(s): BNP, PROBNP in the last 168 hours.  DDimer No results for input(s): DDIMER in the last 168 hours.   Radiology/Studies:  CT Head Wo Contrast  Result Date: 10/06/2020 CLINICAL DATA:  Passing out and hitting head.  Nausea. EXAM: CT HEAD WITHOUT CONTRAST CT CERVICAL SPINE WITHOUT CONTRAST TECHNIQUE: Multidetector CT imaging of the head and cervical spine was performed following the standard protocol without intravenous contrast. Multiplanar CT image reconstructions of the cervical spine were also generated. COMPARISON:  CT head 02/04/2008. FINDINGS: CT HEAD FINDINGS Brain: Patchy and confluent areas of decreased attenuation are noted throughout the deep and periventricular white matter of the cerebral hemispheres bilaterally, compatible with chronic microvascular ischemic disease. Redemonstration of left cerebellar hypodensity. No evidence of large-territorial acute infarction. No parenchymal hemorrhage. No mass lesion. No extra-axial collection. No mass effect or midline shift. No hydrocephalus. Basilar cisterns are patent. Vascular: No hyperdense vessel. Skull: No acute fracture or focal lesion. Prior left sub-occipital craniotomy. Sinuses/Orbits: Paranasal sinuses and mastoid air cells are clear. The orbits are unremarkable. Other: None. CT CERVICAL SPINE FINDINGS Alignment: Normal. Skull base and vertebrae: Surgical changes related to anterior C4 through C6 fusion. No definite hardware fracture or surrounding lucency. No prior images to compare to. No acute displaced fracture. No aggressive appearing focal osseous lesion or focal pathologic process. Soft tissues and spinal canal: No prevertebral fluid or swelling. No visible canal hematoma. Disc levels:  Maintained. Upper chest: Right apical paraseptal emphysematous changes as well as/pulmonary scarring. Other: None. IMPRESSION: 1. No acute intracranial abnormality. 2. No acute fracture or traumatic  listhesis of the cervical spine. Electronically Signed   By: Iven Finn M.D.   On: 10/06/2020 23:25   CT Head Wo Contrast  Result Date: 10/04/2020 CLINICAL DATA:  Syncope, fell and hit head, weakness, anticoagulated EXAM: CT HEAD WITHOUT CONTRAST TECHNIQUE: Contiguous axial images were obtained from the base of the skull through the vertex without intravenous contrast. COMPARISON:  07/21/2016 FINDINGS: Brain: Confluent hypodensities throughout the periventricular white matter are grossly stable, consistent with chronic small vessel ischemic changes. Stable mild encephalomalacia left cerebellar hemisphere, either postsurgical or related to chronic ischemic change. No evidence of acute infarct or hemorrhage. The lateral ventricles and remaining midline structures are unremarkable. No acute extra-axial fluid collections. No mass effect. Vascular: No hyperdense vessel or unexpected calcification. Skull: Stable changes from left occipital craniectomy. Remainder of the calvarium is unremarkable. Sinuses/Orbits: No acute finding. Other: None. IMPRESSION: 1. No acute intracranial process.  Grossly stable exam. Electronically Signed   By: Randa Ngo M.D.   On: 10/04/2020 17:01   CT Cervical Spine Wo Contrast  Result Date: 10/06/2020 CLINICAL DATA:  Passing out and hitting head.  Nausea. EXAM: CT HEAD WITHOUT CONTRAST CT CERVICAL SPINE WITHOUT CONTRAST TECHNIQUE: Multidetector CT imaging of the head and cervical spine was performed following the standard protocol without intravenous contrast. Multiplanar CT image reconstructions of the cervical spine were also generated. COMPARISON:  CT head 02/04/2008. FINDINGS: CT HEAD FINDINGS Brain: Patchy and confluent areas of decreased attenuation are noted throughout the deep and periventricular white matter of the cerebral hemispheres bilaterally, compatible with chronic microvascular ischemic disease. Redemonstration  of left cerebellar hypodensity. No evidence of  large-territorial acute infarction. No parenchymal hemorrhage. No mass lesion. No extra-axial collection. No mass effect or midline shift. No hydrocephalus. Basilar cisterns are patent. Vascular: No hyperdense vessel. Skull: No acute fracture or focal lesion. Prior left sub-occipital craniotomy. Sinuses/Orbits: Paranasal sinuses and mastoid air cells are clear. The orbits are unremarkable. Other: None. CT CERVICAL SPINE FINDINGS Alignment: Normal. Skull base and vertebrae: Surgical changes related to anterior C4 through C6 fusion. No definite hardware fracture or surrounding lucency. No prior images to compare to. No acute displaced fracture. No aggressive appearing focal osseous lesion or focal pathologic process. Soft tissues and spinal canal: No prevertebral fluid or swelling. No visible canal hematoma. Disc levels:  Maintained. Upper chest: Right apical paraseptal emphysematous changes as well as/pulmonary scarring. Other: None. IMPRESSION: 1. No acute intracranial abnormality. 2. No acute fracture or traumatic listhesis of the cervical spine. Electronically Signed   By: Iven Finn M.D.   On: 10/06/2020 23:25   ECHOCARDIOGRAM COMPLETE  Result Date: 10/07/2020    ECHOCARDIOGRAM REPORT   Patient Name:   CARMICHAEL BURDETTE Date of Exam: 10/07/2020 Medical Rec #:  [id number]     Height:       72.0 in Accession #:    [id number]    Weight:       176.0 lb Date of Birth:  01-11-1936     BSA:          2.018 m Patient Age:    52 years      BP:           186/70 mmHg Patient Gender: M             HR:           50 bpm. Exam Location:  [facility] Procedure: 2D Echo, Cardiac Doppler and Color Doppler Indications:     Syncope 780.2  History:         Patient has prior history of Echocardiogram examinations, most                  recent 07/03/2020. COPD; Risk Factors:Hypertension and Diabetes.  Sonographer:     Sherrie Sport RDCS (AE) Referring Phys:  [id number] Ilene Qua OPYD Diagnosing Phys: Kathlyn Sacramento MD IMPRESSIONS  1. Left  ventricular ejection fraction, by estimation, is 55 to 60%. The left ventricle has normal function. The left ventricle has no regional wall motion abnormalities. There is mild left ventricular hypertrophy. Left ventricular diastolic parameters were normal.  2. Right ventricular systolic function is normal. The right ventricular size is normal. There is normal pulmonary artery systolic pressure.  3. The mitral valve is normal in structure. No evidence of mitral valve regurgitation. No evidence of mitral stenosis.  4. The aortic valve is normal in structure. Aortic valve regurgitation is not visualized. No aortic stenosis is present.  5. The inferior vena cava is normal in size with greater than 50% respiratory variability, suggesting right atrial pressure of 3 mmHg. FINDINGS  Left Ventricle: Left ventricular ejection fraction, by estimation, is 55 to 60%. The left ventricle has normal function. The left ventricle has no regional wall motion abnormalities. The left ventricular internal cavity size was normal in size. There is  mild left ventricular hypertrophy. Left ventricular diastolic parameters were normal. Right Ventricle: The right ventricular size is normal. No increase in right ventricular wall thickness. Right ventricular systolic function is normal. There is normal pulmonary artery systolic pressure. The tricuspid regurgitant velocity is 2.76  m/s, and  with an assumed right atrial pressure of 5 mmHg, the estimated right ventricular systolic pressure is 36.6 mmHg. Left Atrium: Left atrial size was normal in size. Right Atrium: Right atrial size was normal in size. Pericardium: There is no evidence of pericardial effusion. Mitral Valve: The mitral valve is normal in structure. No evidence of mitral valve regurgitation. No evidence of mitral valve stenosis. Tricuspid Valve: The tricuspid valve is normal in structure. Tricuspid valve regurgitation is trivial. No evidence of tricuspid stenosis. Aortic Valve: The  aortic valve is normal in structure. Aortic valve regurgitation is not visualized. No aortic stenosis is present. Aortic valve mean gradient measures 7.0 mmHg. Aortic valve peak gradient measures 12.2 mmHg. Aortic valve area, by VTI measures 2.10 cm. Pulmonic Valve: The pulmonic valve was normal in structure. Pulmonic valve regurgitation is not visualized. No evidence of pulmonic stenosis. Aorta: The aortic root is normal in size and structure. Venous: The inferior vena cava is normal in size with greater than 50% respiratory variability, suggesting right atrial pressure of 3 mmHg. IAS/Shunts: No atrial level shunt detected by color flow Doppler.  LEFT VENTRICLE PLAX 2D LVIDd:         4.28 cm  Diastology LVIDs:         2.75 cm  LV e' medial:    4.57 cm/s LV PW:         1.26 cm  LV E/e' medial:  18.2 LV IVS:        1.16 cm  LV e' lateral:   5.66 cm/s LVOT diam:     2.00 cm  LV E/e' lateral: 14.7 LV SV:         88 LV SV Index:   43 LVOT Area:     3.14 cm  LEFT ATRIUM         Index LA diam:    3.50 cm 1.73 cm/m  AORTIC VALVE                    PULMONIC VALVE AV Area (Vmax):    1.87 cm     PV Vmax:        0.95 m/s AV Area (Vmean):   1.77 cm     PV Peak grad:   3.6 mmHg AV Area (VTI):     2.10 cm     RVOT Peak grad: 5 mmHg AV Vmax:           175.00 cm/s AV Vmean:          126.000 cm/s AV VTI:            0.417 m AV Peak Grad:      12.2 mmHg AV Mean Grad:      7.0 mmHg LVOT Vmax:         104.00 cm/s LVOT Vmean:        71.000 cm/s LVOT VTI:          0.279 m LVOT/AV VTI ratio: 0.67  AORTA Ao Root diam: 2.90 cm MITRAL VALVE                TRICUSPID VALVE MV Area (PHT): 2.82 cm     TR Peak grad:   30.5 mmHg MV Decel Time: 269 msec     TR Vmax:        276.00 cm/s MV E velocity: 83.40 cm/s MV A velocity: 108.00 cm/s  SHUNTS MV E/A ratio:  0.77         Systemic VTI:  0.28 m                             Systemic Diam: 2.00 cm Kathlyn Sacramento MD Electronically signed by Kathlyn Sacramento MD Signature Date/Time:  10/07/2020/11:08:06 AM    Final      Assessment and Plan:   1. Syncope Likely 2/2 intermittent high degree AVB. Plan for PPM today. NPO after MN last night. Procedure discussed in detail with the patient this morning including the risks and expected recovery time and he is in agreement.  Risks, benefits, alternatives to PPM implantation were discussed in detail with the patient today. The patient understands that the risks include but are not limited to bleeding, infection, pneumothorax, perforation, tamponade, vascular damage, renal failure, MI, stroke, death, and lead dislodgement and wishes to proceed.    For questions or updates, please contact [place] Please consult [email or web address] for contact info under    Signed, Vickie Epley, MD  10/08/2020 7:04 AM

## 2020-10-08 NOTE — Plan of Care (Signed)

## 2020-10-08 NOTE — Discharge Summary (Addendum)
Physician Discharge Summary  Patient ID: Zachary Santos MRN: [id number] DOB/AGE: 1935-12-01 84 y.o.  Admit date: 10/06/2020 Discharge date: 10/08/2020  Admission Diagnoses:  Discharge Diagnoses:  Principal Problem:   Cardiac syncope Active Problems:   COPD (chronic obstructive pulmonary disease) ([place])   DM type 2 (diabetes mellitus, type 2) (HCC)   CKD (chronic kidney disease), stage III (HCC)   A-fib (HCC) Recurrent syncope secondary to AV block. Third-degree AV block with sick sinus syndrome. Paroxysmal atrial fibrillation with rapid immature response. Chronic kidney disease stage IIIa. Uncontrolled type 2 diabetes with hyperglycemia.   Discharged Condition: good  Hospital Course:  Zachary Santos a 84 y.o.malewith medical history significant forCOPD, chronic kidney disease stage III, type 2 diabetes mellitus, hypertension, and atrial fibrillation on Eliquis, now presenting to the emergency department after syncopal episode. Patient had been seen in the emergency department 2 days earlier after syncopal episode that was preceded by acute lightheadedness and nausea.    In the ED, EKG demonstrated sinus bradycardia with rate 55 and first-degree AV nodal block. Later, heart rate reportedly dropped to the low 30s and appeared to be in complete heart block per report of ED physician.  Patient is seen by cardiology, pacemaker was placed.  Chest x-ray confirmed good position of electrode. Cardiology has cleared patient for discharge. Will be followed by cardiology and PCP as outpatient. Also follow with PCP for type 2 diabetes.   Consults: cardiology  Significant Diagnostic Studies:   Treatments: Pacemaker.  Discharge Exam: Blood pressure 138/63, pulse 76, temperature 97.6 F (36.4 C), temperature source Oral, resp. rate 18, height 6' (1.829 m), weight 82.3 kg, SpO2 93 %. General appearance: alert and cooperative Resp: clear to auscultation bilaterally Cardio: regular  rate and rhythm, S1, S2 normal, no murmur, click, rub or gallop GI: soft, non-tender; bowel sounds normal; no masses,  no organomegaly Extremities: extremities normal, atraumatic, no cyanosis or edema  Disposition: Discharge disposition: 01-Home or Self Care       Discharge Instructions    Diet - low sodium heart healthy   Complete by: As directed    Increase activity slowly   Complete by: As directed      Allergies as of 10/08/2020   No Known Allergies     Medication List    TAKE these medications   amiodarone 200 MG tablet Commonly known as: PACERONE Take 1 tablet by mouth two times a day for one week, then take 1 tablet by mouth once daily.   apixaban 5 MG Tabs tablet Commonly known as: ELIQUIS Take 1 tablet (5 mg total) by mouth 2 (two) times daily.   blood glucose meter kit and supplies Kit Dispense based on patient and insurance preference. Use up to four times daily as directed. (FOR ICD-9 250.00, 250.01).   latanoprost 0.005 % ophthalmic solution Commonly known as: XALATAN INSTILL 1 DROP INTO EACH EYE AT BEDTIME   lisinopril 20 MG tablet Commonly known as: ZESTRIL Take 1 tablet (20 mg total) by mouth daily.   metFORMIN 500 MG 24 hr tablet Commonly known as: Glucophage XR Take 1 tablet (500 mg total) by mouth in the morning and at bedtime.   ONE TOUCH ULTRA TEST test strip Generic drug: glucose blood USE TEST STRIP TO CHECK GLUCOSE AT LEAST ONCE DAILY   rosuvastatin 20 MG tablet Commonly known as: CRESTOR Take 1 tablet by mouth once daily   tamsulosin 0.4 MG Caps capsule Commonly known as: FLOMAX Take 2 capsules by mouth once  daily   vitamin B-12 500 MCG tablet Commonly known as: CYANOCOBALAMIN Take 500 mcg by mouth daily.       Follow-up Information    Leone Haven, MD. Schedule an appointment as soon as possible for a visit today.   Specialty: Family Medicine Contact information: [address] [place]  [address] [phone]        Schedule an appointment as soon as possible for a visit  with Dionisio David, MD.   Specialty: Cardiology Contact information: [place]  [address] [phone]               Signed: Sharen Hones 10/08/2020, 3:30 PM

## 2020-10-08 NOTE — Progress Notes (Addendum)
   S/p [employer] pacemaker implantation.  2 view chest x-ray ordered and to be performed around 12 PM today.   [employer] device representative to interrogate the device.   Initial estimate of discharge is later today.   AVS instructions for care /follow-up already inserted and as summarized below:  Continue to hold Eliquis for the next 5 days.  Continue to keep the area dry for 1 week.  For the next 8 weeks, no heavy lifting of L arm and no sleeping on L arm.  Follow-up at [facility] within the next 7 to 10 days.  Signed, Arvil Chaco, PA-C 10/08/2020, 9:35 AM

## 2020-10-09 ENCOUNTER — Telehealth: Payer: Self-pay

## 2020-10-09 NOTE — Telephone Encounter (Signed)
Transition Care Management Unsuccessful Follow-up Telephone Call  Date of discharge and from where:  10/08/20 From [facility]  Attempts:  2nd Attempt  Reason for unsuccessful TCM follow-up call:  Unable to leave message. Voicemail full.

## 2020-10-09 NOTE — Telephone Encounter (Signed)
Transition Care Management Unsuccessful Follow-up Telephone Call  Date of discharge and from where:  10/08/20 from [facility]  Attempts:  1st Attempt  Reason for unsuccessful TCM follow-up call:  Unable to leave message. No answer, no voicemail.

## 2020-10-13 NOTE — Telephone Encounter (Signed)
Transition Care Management Unsuccessful Follow-up Telephone Call  Date of discharge and from where:  10/08/20 from [facility]  Attempts:  3rd Attempt  Reason for unsuccessful TCM follow-up call:  Unable to leave message.

## 2020-10-15 ENCOUNTER — Telehealth: Payer: Self-pay | Admitting: Family Medicine

## 2020-10-15 NOTE — Telephone Encounter (Signed)
Patient returned office phone call to be screened to come into office on 10/21/19 for a hospital follow up. Patient says he has a rash from where the procedure was done. Can he come into office? Please call cell phone first and then home number.

## 2020-10-20 ENCOUNTER — Ambulatory Visit (INDEPENDENT_AMBULATORY_CARE_PROVIDER_SITE_OTHER): Payer: Medicare Other | Admitting: Family Medicine

## 2020-10-20 ENCOUNTER — Other Ambulatory Visit: Payer: Self-pay

## 2020-10-20 ENCOUNTER — Encounter: Payer: Self-pay | Admitting: Family Medicine

## 2020-10-20 VITALS — BP 130/70 | HR 60 | Temp 97.5°F | Ht 72.0 in | Wt 173.0 lb

## 2020-10-20 DIAGNOSIS — I442 Atrioventricular block, complete: Secondary | ICD-10-CM

## 2020-10-20 DIAGNOSIS — G2581 Restless legs syndrome: Secondary | ICD-10-CM | POA: Diagnosis not present

## 2020-10-20 DIAGNOSIS — G4700 Insomnia, unspecified: Secondary | ICD-10-CM | POA: Diagnosis not present

## 2020-10-20 DIAGNOSIS — R29898 Other symptoms and signs involving the musculoskeletal system: Secondary | ICD-10-CM | POA: Diagnosis not present

## 2020-10-20 DIAGNOSIS — R21 Rash and other nonspecific skin eruption: Secondary | ICD-10-CM | POA: Diagnosis not present

## 2020-10-20 LAB — FERRITIN: Ferritin: 673.6 ng/mL — ABNORMAL HIGH (ref 22.0–322.0)

## 2020-10-20 MED ORDER — TRIAMCINOLONE ACETONIDE 0.1 % EX CREA: 1.0000 "application " | TOPICAL_CREAM | Freq: Two times a day (BID) | CUTANEOUS | 0 refills | Status: DC

## 2020-10-20 NOTE — Assessment & Plan Note (Signed)
Pacemaker in place.  He will keep his appointment for his wound check tomorrow.  I will send a message to his cardiologist regarding his rash.

## 2020-10-20 NOTE — Telephone Encounter (Signed)
Per appointment note Patient has been cleared by Coralee North and Dr Birdie Sons to come in

## 2020-10-20 NOTE — Progress Notes (Signed)
Marikay Alar, MD Phone: [phone]  Zachary Santos is a 85 y.o. male who presents today for follow-up.  Syncope: Patient had 2 syncopal episodes.  He was subsequently evaluated in the emergency room on 2 separate occasions.  During the second evaluation he was observed to go into heart block.  He was admitted and had a pacemaker placed.  He notes no chest pain, palpitations, or syncope since having the pacemaker placed.  He does have follow-up scheduled with cardiology.  He notes some fatigue and sensation of bilateral leg weakness with difficulty ambulating since being hospitalized.  Patient also noted as soon as he got home he developed a rash over his chest that was significantly pruritic.  He took a shower and was able to cleanse off the antiseptic that they used and noted that helped significantly.  No longer itches though the rash is still present on his bilateral chest.  He did try a lotion with alcohol on his rash that burned.  He tried [employer] which was helpful.  Sleep difficulty: This has been an ongoing issue for quite some time.  He has limited screen time significantly.  He notes every time he starts to fall asleep his legs will start to twitch and he will have to move them around.  He wishes he could get at least an hour of sleep at a time.  In the past he was on trazodone though that was not beneficial.  Social History   Tobacco Use  Smoking Status Current Every Day Smoker  . Packs/day: 1.50  . Years: 60.00  . Pack years: 90.00  . Types: Cigarettes  Smokeless Tobacco Former User     ROS see history of present illness  Objective  Physical Exam Vitals:   10/20/20 1136  BP: 130/70  Pulse: 60  Temp: (!) 97.5 F (36.4 C)  SpO2: 97%    BP Readings from Last 3 Encounters:  10/20/20 130/70  10/08/20 138/63  10/04/20 (!) 217/82   Wt Readings from Last 3 Encounters:  10/20/20 173 lb (78.5 kg)  10/08/20 181 lb 6.4 oz (82.3 kg)  10/04/20 176 lb (79.8 kg)     Physical Exam Constitutional:      General: He is not in acute distress.    Appearance: He is not diaphoretic.  Cardiovascular:     Rate and Rhythm: Normal rate and regular rhythm.     Heart sounds: Normal heart sounds.  Pulmonary:     Effort: Pulmonary effort is normal.     Breath sounds: Normal breath sounds.  Chest:    Musculoskeletal:        General: No edema.     Right lower leg: No edema.     Left lower leg: No edema.  Skin:    General: Skin is warm and dry.  Neurological:     Mental Status: He is alert.     Comments: 5/5 strength bilateral quads, hamstrings, plantar flexion, and dorsiflexion, gait is slow and tentative while using a cane      Assessment/Plan: Please see individual problem list.  Problem List Items Addressed This Visit    Bilateral leg weakness    Possibly related to deconditioning.  There is no focal issues with his legs.  We will refer him for physical therapy.      Relevant Orders   Ambulatory referral to Physical Therapy   Complete heart block (HCC)    Pacemaker in place.  He will keep his appointment for his wound  check tomorrow.  I will send a message to his cardiologist regarding his rash.      Insomnia    Chronic ongoing issue.  It sounds as though there is some restless leg syndrome going on that may be contributing to this.  We will check a ferritin level.  We can treat with iron if needed.  I do not think trazodone would be a great option given his complete heart block and pacemaker placement.  If he does not require iron replacement we could trial gabapentin as this may help with his restless leg syndrome and also induce sleepiness.      Rash    I suspect this is a reaction what ever antiseptic they used for his procedure.  It has improved with rinsing the area.  I have prescribed him triamcinolone to use on his right and central chest though I did advise him not to use this near his pacemaker site until I check with his  cardiologist.  Also advised not to use in his axilla given risk of tissue atrophy.       Other Visit Diagnoses    RLS (restless legs syndrome)    -  Primary   Relevant Orders   Ferritin      This visit occurred during the SARS-CoV-2 public health emergency.  Safety protocols were in place, including screening questions prior to the visit, additional usage of staff PPE, and extensive cleaning of exam room while observing appropriate contact time as indicated for disinfecting solutions.    [name], MD [place]

## 2020-10-20 NOTE — Assessment & Plan Note (Signed)
Possibly related to deconditioning.  There is no focal issues with his legs.  We will refer him for physical therapy.

## 2020-10-20 NOTE — Assessment & Plan Note (Signed)
Chronic ongoing issue.  It sounds as though there is some restless leg syndrome going on that may be contributing to this.  We will check a ferritin level.  We can treat with iron if needed.  I do not think trazodone would be a great option given his complete heart block and pacemaker placement.  If he does not require iron replacement we could trial gabapentin as this may help with his restless leg syndrome and also induce sleepiness.

## 2020-10-20 NOTE — Assessment & Plan Note (Signed)
I suspect this is a reaction what ever antiseptic they used for his procedure.  It has improved with rinsing the area.  I have prescribed him triamcinolone to use on his right and central chest though I did advise him not to use this near his pacemaker site until I check with his cardiologist.  Also advised not to use in his axilla given risk of tissue atrophy.

## 2020-10-20 NOTE — Patient Instructions (Signed)
Nice to see you. We are going to check your ferritin level and will contact you with the result.  We will then make a decision on what to do for your sleep. You can use the triamcinolone on the rash on your chest.  Please do not apply this near your pacemaker.  Please also do not apply this to your armpits.  I will let her cardiologist know.

## 2020-10-21 ENCOUNTER — Ambulatory Visit: Payer: Medicare Other

## 2020-10-22 ENCOUNTER — Ambulatory Visit: Payer: Medicare Other | Admitting: Family Medicine

## 2020-10-24 ENCOUNTER — Telehealth: Payer: Self-pay | Admitting: Family Medicine

## 2020-10-24 NOTE — Telephone Encounter (Signed)
Please let the patient know I heard back from cardiology.  They recommended that he avoid using the topical steroid cream on the incision site and they recommended that he avoid the area 1 cm to either side of the incision.  Please see how the rash is doing with the steroid.  Thanks.

## 2020-10-24 NOTE — Telephone Encounter (Signed)
-----   Message from Vickie Epley, MD sent at 10/23/2020  5:34 PM EST ----- Lyna Poser, Thanks for reaching out. If possible, I'd like to avoid the cream directly over the incision but in the surrounding areas it should be OK. Lets just avoid a 1 cm area on either side of the incision. Sonia Baller, can you help me make sure Mr Rico has an appointment in our device clinic in [place] to check in on his skin and incision in the next week or so? Thx! CL      ----- Message ----- From: Leone Haven, MD Sent: 10/20/2020  12:56 PM EST To: Vickie Epley, MD  Hi Lysbeth Galas,   I saw Mr Pavek for hospital follow-up today. He noted the day of discharge he developed a pretty significant pruritic rash that covered his chest. It seems that this was related to the antiseptic that was used during his procedure for his pacemaker placement. The itching improved though the rash is still present. I prescribed him triamcinolone to use on the areas of his chest where his pacemaker is NOT located. If needed would it be ok for him to use this over his pacemaker site or should we continue to avoid that? Thanks for your help.   Randall Hiss

## 2020-10-24 NOTE — Telephone Encounter (Signed)
I called and spoke with the patient and I informed him that the provider spoke with the cardiologist and he is to avoid the incision area with the steroid cream and at least `1 cm on either side and he understood.  Patient stated the rash was about the same as it was before.  Floree Zuniga,cma

## 2020-10-24 NOTE — Telephone Encounter (Signed)
LVM for the patient to call back. Nazir Hacker,cma  

## 2020-10-24 NOTE — Telephone Encounter (Signed)
Noted. If the rash does not start to improve over the next few days he should let us know so we can have him see a dermatologist.

## 2020-10-29 ENCOUNTER — Telehealth: Payer: Self-pay

## 2020-10-29 NOTE — Telephone Encounter (Signed)
I am happy to complete this for her though please contact her and get more details on her father. Please see if the patient would be willing to complete another visit this week to see how he is doing. Thanks.

## 2020-10-29 NOTE — Telephone Encounter (Signed)
Pt's daughter Willette Cluster came by and brought FMLA paperwork for herself. She states that she needs this ASAP to keep her job and benefits. She was clearly upset and crying. She states that pt and his wife are not eating or drinking. She states that they can not do much for themselves and need 24 hour care. She would appreciate if this could be filled out asap. She would also like a call @ [phone] to clarify. She can not get pt to go to the doctor or anything. She does not see this getting better. If able to fill out [facility] fax to Attn:Christy Ovid Curd at fax # [phone]. Placed in folder up front.Thanks.  this paperwork was put in the sign basket.  Deshonda Cryderman,cma

## 2020-10-29 NOTE — Telephone Encounter (Signed)
Pt's daughter Willette Cluster came by and brought FMLA paperwork for herself. She states that she needs this ASAP to keep her job and benefits. She was clearly upset and crying. She states that pt and his wife are not eating or drinking. She states that they can not do much for themselves and need 24 hour care. She would appreciate if this could be filled out asap. She would also like a call @ [phone] to clarify. She can not get pt to go to the doctor or anything. She does not see this getting better. If able to fill out [facility] fax to Attn:Christy Ovid Curd at fax # [phone]. Placed in folder up front.Thanks

## 2020-10-29 NOTE — Telephone Encounter (Signed)
I called and spoke with the patient's daughter Willette Cluster.  She stated her mother is bed ridden and the patient won't get out of bed except to use the bathroom and some times he does not make it.  He has had some near miss falls. He and his wife refuses to eat anything or drink.  They have been like this since christmas, they went and purchased a covid test and they both refused to take it.  She stated she would love for him to see a doctor but he refuses, he would not even go to see his provider for a f/up since getting his pacemaker.  She stated she had to call protective services, and her and her brother are trying to get power of attorney over them both.  He refuses to take all his medications as well. She stated any suggestions from you would be helpful.  She wants the papers filled out because they have to watch them both 24 hours right now.

## 2020-10-29 NOTE — Telephone Encounter (Signed)
Noted. FMLA forms completed to cover from 10/20/20-11/20/20. If needed I can extend them moving forward. I will also forward to [facility] to submit to DSS as well even though it sounds like the daughter has already done this. There seems to be self neglect going on and I would like for DSS to hear from [place] as well. If he seems dehydrated as he is not eating or drinking I would suggest he be evaluated in the ED.

## 2020-10-30 ENCOUNTER — Inpatient Hospital Stay (HOSPITAL_COMMUNITY)
Admission: EM | Admit: 2020-10-30 | Discharge: 2020-11-04 | DRG: 177 | Disposition: A | Discharge status: Home Health | Payer: Medicare Other | Attending: Internal Medicine | Admitting: Internal Medicine

## 2020-10-30 ENCOUNTER — Emergency Department (HOSPITAL_COMMUNITY): Payer: Medicare Other

## 2020-10-30 ENCOUNTER — Telehealth: Payer: Medicare Other

## 2020-10-30 ENCOUNTER — Encounter (HOSPITAL_COMMUNITY): Payer: Self-pay

## 2020-10-30 ENCOUNTER — Telehealth: Payer: Self-pay | Admitting: Pharmacist

## 2020-10-30 ENCOUNTER — Other Ambulatory Visit: Payer: Self-pay

## 2020-10-30 DIAGNOSIS — I495 Sick sinus syndrome: Secondary | ICD-10-CM | POA: Diagnosis present

## 2020-10-30 DIAGNOSIS — R21 Rash and other nonspecific skin eruption: Secondary | ICD-10-CM | POA: Diagnosis not present

## 2020-10-30 DIAGNOSIS — N400 Enlarged prostate without lower urinary tract symptoms: Secondary | ICD-10-CM | POA: Diagnosis present

## 2020-10-30 DIAGNOSIS — Z743 Need for continuous supervision: Secondary | ICD-10-CM | POA: Diagnosis not present

## 2020-10-30 DIAGNOSIS — E785 Hyperlipidemia, unspecified: Secondary | ICD-10-CM | POA: Diagnosis present

## 2020-10-30 DIAGNOSIS — Z961 Presence of intraocular lens: Secondary | ICD-10-CM | POA: Diagnosis present

## 2020-10-30 DIAGNOSIS — I482 Chronic atrial fibrillation, unspecified: Secondary | ICD-10-CM | POA: Diagnosis present

## 2020-10-30 DIAGNOSIS — F17210 Nicotine dependence, cigarettes, uncomplicated: Secondary | ICD-10-CM | POA: Diagnosis present

## 2020-10-30 DIAGNOSIS — R509 Fever, unspecified: Secondary | ICD-10-CM

## 2020-10-30 DIAGNOSIS — E1169 Type 2 diabetes mellitus with other specified complication: Secondary | ICD-10-CM | POA: Diagnosis not present

## 2020-10-30 DIAGNOSIS — Z79899 Other long term (current) drug therapy: Secondary | ICD-10-CM

## 2020-10-30 DIAGNOSIS — F419 Anxiety disorder, unspecified: Secondary | ICD-10-CM | POA: Diagnosis present

## 2020-10-30 DIAGNOSIS — J440 Chronic obstructive pulmonary disease with acute lower respiratory infection: Secondary | ICD-10-CM | POA: Diagnosis not present

## 2020-10-30 DIAGNOSIS — E1151 Type 2 diabetes mellitus with diabetic peripheral angiopathy without gangrene: Secondary | ICD-10-CM | POA: Diagnosis present

## 2020-10-30 DIAGNOSIS — R42 Dizziness and giddiness: Secondary | ICD-10-CM | POA: Diagnosis not present

## 2020-10-30 DIAGNOSIS — I442 Atrioventricular block, complete: Secondary | ICD-10-CM | POA: Diagnosis present

## 2020-10-30 DIAGNOSIS — R531 Weakness: Secondary | ICD-10-CM | POA: Diagnosis present

## 2020-10-30 DIAGNOSIS — J9601 Acute respiratory failure with hypoxia: Secondary | ICD-10-CM | POA: Diagnosis not present

## 2020-10-30 DIAGNOSIS — E1122 Type 2 diabetes mellitus with diabetic chronic kidney disease: Secondary | ICD-10-CM | POA: Diagnosis not present

## 2020-10-30 DIAGNOSIS — I10 Essential (primary) hypertension: Secondary | ICD-10-CM | POA: Diagnosis not present

## 2020-10-30 DIAGNOSIS — Z7901 Long term (current) use of anticoagulants: Secondary | ICD-10-CM

## 2020-10-30 DIAGNOSIS — Z7984 Long term (current) use of oral hypoglycemic drugs: Secondary | ICD-10-CM

## 2020-10-30 DIAGNOSIS — I129 Hypertensive chronic kidney disease with stage 1 through stage 4 chronic kidney disease, or unspecified chronic kidney disease: Secondary | ICD-10-CM | POA: Diagnosis present

## 2020-10-30 DIAGNOSIS — Z9842 Cataract extraction status, left eye: Secondary | ICD-10-CM

## 2020-10-30 DIAGNOSIS — Z833 Family history of diabetes mellitus: Secondary | ICD-10-CM | POA: Diagnosis not present

## 2020-10-30 DIAGNOSIS — U071 COVID-19: Principal | ICD-10-CM

## 2020-10-30 DIAGNOSIS — Z825 Family history of asthma and other chronic lower respiratory diseases: Secondary | ICD-10-CM

## 2020-10-30 DIAGNOSIS — N1831 Chronic kidney disease, stage 3a: Secondary | ICD-10-CM | POA: Diagnosis not present

## 2020-10-30 DIAGNOSIS — R059 Cough, unspecified: Secondary | ICD-10-CM | POA: Diagnosis not present

## 2020-10-30 DIAGNOSIS — I517 Cardiomegaly: Secondary | ICD-10-CM | POA: Diagnosis not present

## 2020-10-30 DIAGNOSIS — E1165 Type 2 diabetes mellitus with hyperglycemia: Secondary | ICD-10-CM | POA: Diagnosis present

## 2020-10-30 DIAGNOSIS — I499 Cardiac arrhythmia, unspecified: Secondary | ICD-10-CM | POA: Diagnosis not present

## 2020-10-30 DIAGNOSIS — J432 Centrilobular emphysema: Secondary | ICD-10-CM | POA: Diagnosis not present

## 2020-10-30 DIAGNOSIS — R0902 Hypoxemia: Secondary | ICD-10-CM | POA: Diagnosis not present

## 2020-10-30 DIAGNOSIS — J449 Chronic obstructive pulmonary disease, unspecified: Secondary | ICD-10-CM | POA: Diagnosis present

## 2020-10-30 DIAGNOSIS — R627 Adult failure to thrive: Secondary | ICD-10-CM | POA: Diagnosis not present

## 2020-10-30 DIAGNOSIS — Z950 Presence of cardiac pacemaker: Secondary | ICD-10-CM | POA: Diagnosis not present

## 2020-10-30 DIAGNOSIS — J1282 Pneumonia due to coronavirus disease 2019: Secondary | ICD-10-CM | POA: Diagnosis not present

## 2020-10-30 DIAGNOSIS — R6889 Other general symptoms and signs: Secondary | ICD-10-CM | POA: Diagnosis not present

## 2020-10-30 DIAGNOSIS — Z9841 Cataract extraction status, right eye: Secondary | ICD-10-CM | POA: Diagnosis not present

## 2020-10-30 DIAGNOSIS — M1990 Unspecified osteoarthritis, unspecified site: Secondary | ICD-10-CM | POA: Diagnosis present

## 2020-10-30 DIAGNOSIS — R0689 Other abnormalities of breathing: Secondary | ICD-10-CM | POA: Diagnosis not present

## 2020-10-30 DIAGNOSIS — E782 Mixed hyperlipidemia: Secondary | ICD-10-CM | POA: Diagnosis not present

## 2020-10-30 LAB — COMPREHENSIVE METABOLIC PANEL
ALT: 154 U/L — ABNORMAL HIGH (ref 0–44)
AST: 181 U/L — ABNORMAL HIGH (ref 15–41)
Albumin: 2.4 g/dL — ABNORMAL LOW (ref 3.5–5.0)
Alkaline Phosphatase: 100 U/L (ref 38–126)
Anion gap: 10 (ref 5–15)
BUN: 23 mg/dL (ref 8–23)
CO2: 25 mmol/L (ref 22–32)
Calcium: 8.1 mg/dL — ABNORMAL LOW (ref 8.9–10.3)
Chloride: 103 mmol/L (ref 98–111)
Creatinine, Ser: 1.33 mg/dL — ABNORMAL HIGH (ref 0.61–1.24)
GFR, Estimated: 53 mL/min — ABNORMAL LOW (ref >60)
Glucose, Bld: 235 mg/dL — ABNORMAL HIGH (ref 70–99)
Potassium: 3.9 mmol/L (ref 3.5–5.1)
Sodium: 138 mmol/L (ref 135–145)
Total Bilirubin: 1 mg/dL (ref 0.3–1.2)
Total Protein: 5.6 g/dL — ABNORMAL LOW (ref 6.5–8.1)

## 2020-10-30 LAB — CBC WITH DIFFERENTIAL/PLATELET
Abs Immature Granulocytes: 0.04 10*3/uL (ref 0.00–0.07)
Basophils Absolute: 0 10*3/uL (ref 0.0–0.1)
Basophils Relative: 0 %
Eosinophils Absolute: 0.1 10*3/uL (ref 0.0–0.5)
Eosinophils Relative: 1 %
HCT: 36.5 % — ABNORMAL LOW (ref 39.0–52.0)
Hemoglobin: 12.1 g/dL — ABNORMAL LOW (ref 13.0–17.0)
Immature Granulocytes: 1 %
Lymphocytes Relative: 12 %
Lymphs Abs: 0.6 10*3/uL — ABNORMAL LOW (ref 0.7–4.0)
MCH: 30.2 pg (ref 26.0–34.0)
MCHC: 33.2 g/dL (ref 30.0–36.0)
MCV: 91 fL (ref 80.0–100.0)
Monocytes Absolute: 0.5 10*3/uL (ref 0.1–1.0)
Monocytes Relative: 9 %
Neutro Abs: 4.1 10*3/uL (ref 1.7–7.7)
Neutrophils Relative %: 77 %
Platelets: 234 10*3/uL (ref 150–400)
RBC: 4.01 MIL/uL — ABNORMAL LOW (ref 4.22–5.81)
RDW: 13.1 % (ref 11.5–15.5)
WBC: 5.3 10*3/uL (ref 4.0–10.5)
nRBC: 0 % (ref 0.0–0.2)

## 2020-10-30 LAB — SARS CORONAVIRUS 2 (TAT 6-24 HRS): SARS Coronavirus 2: POSITIVE — AB

## 2020-10-30 MED ORDER — SODIUM CHLORIDE 0.9 % IV SOLN
500.0000 mg | Freq: Once | INTRAVENOUS | Status: AC
  Administered 2020-10-31: 500 mg via INTRAVENOUS
  Filled 2020-10-30: qty 500

## 2020-10-30 MED ORDER — METHYLPREDNISOLONE SODIUM SUCC 125 MG IJ SOLR
125.0000 mg | Freq: Once | INTRAMUSCULAR | Status: AC
  Administered 2020-10-30: 125 mg via INTRAVENOUS
  Filled 2020-10-30: qty 2

## 2020-10-30 MED ORDER — SODIUM CHLORIDE 0.9 % IV SOLN
1.0000 g | Freq: Once | INTRAVENOUS | Status: AC
  Administered 2020-10-31: 1 g via INTRAVENOUS
  Filled 2020-10-30: qty 10

## 2020-10-30 NOTE — Telephone Encounter (Signed)
  Chronic Care Management   Note  10/30/2020 Name: Zachary Santos MRN: [id number] DOB: 1935-11-13   Attempted to contact patient for scheduled appointment for medication management support. Left HIPAA compliant message for patient to return my call at their convenience.    Plan: - If I do not hear back from the patient by end of business today, will collaborate with Care Guide to outreach to schedule follow up with me   Catie Darnelle Maffucci, PharmD, [place], [place] Pharmacist [employer] [facility] [employer] [phone]

## 2020-10-30 NOTE — ED Provider Notes (Signed)
[facility] EMERGENCY DEPARTMENT Provider Note   CSN: [id number] Arrival date & time: 10/30/20  1749     History Chief Complaint  Patient presents with  . Weakness  . Failure To Thrive    Zachary Santos is a 85 y.o. male.  HPI   85 year old male with past medical history of HTN, HLD, DM, CKD, pacemaker placed due to high degree AV block presents to the emergency department for general decline. Patient is very weak, relays to me that over the past couple days has been very ill, generally weak. He admits to intermittent cough. He denies any specific complaints at this time including headache, chest pain, shortness of breath, vomiting/diarrhea, swelling of his lower extremities. He was noted to be febrile with EMS, given Tylenol prior to arrival.  Past Medical History:  Diagnosis Date  . Anxiety   . BPH (benign prostatic hyperplasia)   . Chronic fatigue   . CKD (chronic kidney disease), stage III ([place])   . Claudication [facility])    a. R Hip.  [name] COPD (chronic obstructive pulmonary disease) ([place])   . Dyspnea    with exertion  . Essential hypertension    a. 01/2008 Echo: EF 65%, no rwma, mod increased PASP.  [name] Hypercholesterolemia   . Osteoarthritis   . Paget's disease   . Syncope    a. 03/2015 in setting of hypotension.  . Tobacco abuse    a. 1-1.5 ppd x 60+ yrs.  . Type II diabetes mellitus ([place])   . Wears partial dentures    uppers    Patient Active Problem List   Diagnosis Date Noted  . Rash 10/20/2020  . Bilateral leg weakness 10/20/2020  . Cardiac syncope 10/07/2020  . Complete heart block ([place])   . Musculoskeletal chest pain 08/18/2020  . Atherosclerosis 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 ([place]) 06/06/2020  . A-fib ([place]) 05/27/2020  . Blue toes 05/27/2020  . Insomnia 11/23/2019  . Anxiety and depression 07/17/2019  . Erectile dysfunction 08/29/2018  . Elevated alkaline phosphatase level 08/02/2017  . Fatigue 05/02/2017  .  Dyslipidemia 11/01/2016  . Right leg claudication ([place]) 06/20/2015  . CKD (chronic kidney disease), stage III ([place])   . DM type 2 (diabetes mellitus, type 2) ([place]) 09/12/2013  . Osteoarthritis of right knee 06/07/2013  . BPH (benign prostatic hyperplasia) 02/02/2010  . Essential hypertension, benign 02/05/2008  . COPD (chronic obstructive pulmonary disease) ([place]) 02/05/2008  . Paget's disease of bone 02/05/2008    Past Surgical History:  Procedure Laterality Date  . APPENDECTOMY    . CATARACT EXTRACTION W/PHACO Left 12/11/2019   Procedure: CATARACT EXTRACTION PHACO AND INTRAOCULAR LENS PLACEMENT ([place]) LEFT DIABETIC MALYUGIN;  Surgeon: Birder Robson, MD;  Location: [facility];  Service: Ophthalmology;  Laterality: Left;  CDE 11.45 U/S  1:05.7  . CATARACT EXTRACTION W/PHACO Right 01/08/2020   Procedure: CATARACT EXTRACTION PHACO AND INTRAOCULAR LENS PLACEMENT ([facility]) RIGHT DIABETIC 5.75  00:49.3;  Surgeon: Birder Robson, MD;  Location: [facility];  Service: Ophthalmology;  Laterality: Right;  Diabetic  . laparscopic chole    . PACEMAKER IMPLANT N/A 10/08/2020   Procedure: PACEMAKER IMPLANT;  Surgeon: Vickie Epley, MD;  Location: [facility];  Service: Cardiovascular;  Laterality: N/A;  . SPINE SURGERY     cervical spine surgery  . trigeminal neuralgia     with surgery in [place]       Family History  Problem Relation Age of Onset  .  Cancer Mother        died @ 13.  [name] COPD Father        died @ 57.  . Diabetes Brother        died in early 16's.    Social History   Tobacco Use  . Smoking status: Current Every Day Smoker    Packs/day: 1.50    Years: 60.00    Pack years: 90.00    Types: Cigarettes  . Smokeless tobacco: Former Network engineer Use Topics  . Alcohol use: Not Currently    Alcohol/week: 0.0 standard drinks    Comment: rare - "1% of the time."  . Drug use: No    Home Medications Prior to Admission medications    Medication Sig Start Date End Date Taking? Authorizing Provider  amiodarone (PACERONE) 200 MG tablet Take 1 tablet by mouth two times a day for one week, then take 1 tablet by mouth once daily. 06/30/20   Kate Sable, MD  apixaban (ELIQUIS) 5 MG TABS tablet Take 1 tablet (5 mg total) by mouth 2 (two) times daily. 09/10/20   Leone Haven, MD  blood glucose meter kit and supplies KIT Dispense based on patient and insurance preference. Use up to four times daily as directed. (FOR ICD-9 250.00, 250.01). 09/16/20   Leone Haven, MD  latanoprost (XALATAN) 0.005 % ophthalmic solution INSTILL 1 DROP INTO EACH EYE AT BEDTIME 07/18/19   [provider]  lisinopril (ZESTRIL) 20 MG tablet Take 1 tablet (20 mg total) by mouth daily. 09/10/20   Leone Haven, MD  metFORMIN (GLUCOPHAGE XR) 500 MG 24 hr tablet Take 1 tablet (500 mg total) by mouth in the morning and at bedtime. 09/10/20   Leone Haven, MD  ONE TOUCH ULTRA TEST test strip USE TEST STRIP TO CHECK GLUCOSE AT LEAST ONCE DAILY 05/06/16   [name], DO  rosuvastatin (CRESTOR) 20 MG tablet Take 1 tablet by mouth once daily 08/05/20   Leone Haven, MD  tamsulosin [facility]) 0.4 MG CAPS capsule Take 2 capsules by mouth once daily 12/05/19   Leone Haven, MD  TRAZODONE HCL PO Take by mouth as needed.    [provider]  triamcinolone (KENALOG) 0.1 % Apply 1 application topically 2 (two) times daily. 10/20/20   Leone Haven, MD  vitamin B-12 (CYANOCOBALAMIN) 500 MCG tablet Take 500 mcg by mouth daily.    [provider]    Allergies    Patient has no known allergies.  Review of Systems   Review of Systems  Constitutional: Positive for appetite change, fatigue and fever. Negative for chills.  HENT: Negative for congestion.   Eyes: Negative for visual disturbance.  Respiratory: Positive for cough. Negative for shortness of breath.   Cardiovascular: Negative for chest pain.   Gastrointestinal: Negative for abdominal pain, diarrhea and vomiting.  Genitourinary: Negative for dysuria.  Musculoskeletal: Positive for myalgias.  Skin: Negative for rash.  Neurological: Negative for headaches.    Physical Exam Updated Vital Signs BP (!) 190/121   Pulse (!) 59   Temp 97.7 F (36.5 C) (Rectal)   Resp (!) 21   Ht 6' (1.829 m)   Wt 78.5 kg   SpO2 94%   BMI 23.47 kg/m   Physical Exam Vitals and nursing note reviewed.  Constitutional:      Comments: Fatigued and ill-appearing but no acute distress  HENT:     Head: Normocephalic.     Mouth/Throat:  Mouth: Mucous membranes are moist.  Cardiovascular:     Rate and Rhythm: Normal rate.  Pulmonary:     Effort: Pulmonary effort is normal. No respiratory distress.     Breath sounds: Wheezing present.  Abdominal:     Palpations: Abdomen is soft.     Tenderness: There is no abdominal tenderness.  Musculoskeletal:        General: No swelling.  Skin:    General: Skin is warm.  Neurological:     Mental Status: He is alert and oriented to person, place, and time. Mental status is at baseline.  Psychiatric:        Mood and Affect: Mood normal.     ED Results / Procedures / Treatments   Labs (all labs ordered are listed, but only abnormal results are displayed) Labs Reviewed  CBC WITH DIFFERENTIAL/PLATELET - Abnormal; Notable for the following components:      Result Value   RBC 4.01 (*)    Hemoglobin 12.1 (*)    HCT 36.5 (*)    Lymphs Abs 0.6 (*)    All other components within normal limits  COMPREHENSIVE METABOLIC PANEL - Abnormal; Notable for the following components:   Glucose, Bld 235 (*)    Creatinine, Ser 1.33 (*)    Calcium 8.1 (*)    Total Protein 5.6 (*)    Albumin 2.4 (*)    AST 181 (*)    ALT 154 (*)    GFR, Estimated 53 (*)    All other components within normal limits  SARS CORONAVIRUS 2 (TAT 6-24 HRS)  URINALYSIS, ROUTINE W REFLEX MICROSCOPIC    EKG EKG  Interpretation  Date/Time:  Thursday October 30 2020 17:57:55 EST Ventricular Rate:  60 PR Interval:    QRS Duration: 135 QT Interval:  505 QTC Calculation: 505 R Axis:   -72 Text Interpretation: Sinus rhythm Left bundle branch block NSR, LBBB that appears new Confirmed by Lavenia Atlas [id number]) on 10/30/2020 6:22:52 PM   Radiology DG Chest Port 1 View  Result Date: 10/30/2020 CLINICAL DATA:  85 year old male with cough EXAM: PORTABLE CHEST 1 VIEW COMPARISON:  Chest radiograph dated 10/08/2020. FINDINGS: Bilateral peripheral and subpleural streaky densities, likely sequela of atypical infection including COVID-19. Clinical correlation is recommended. No pleural effusion pneumothorax. Mild cardiomegaly. Left pectoral pacemaker device. No acute osseous pathology. IMPRESSION: Bilateral peripheral and subpleural streaky densities, likely sequela of atypical infection. Clinical correlation is recommended. Electronically Signed   By: Anner Crete M.D.   On: 10/30/2020 19:18    Procedures Procedures (including critical care time)  Medications Ordered in ED Medications  methylPREDNISolone sodium succinate (SOLU-MEDROL) 125 mg/2 mL injection 125 mg (125 mg Intravenous Given 10/30/20 1921)    ED Course  I have reviewed the triage vital signs and the nursing notes.  Pertinent labs & imaging results that were available during my care of the patient were reviewed by me and considered in my medical decision making (see chart for details).    MDM Rules/Calculators/A&P                          85 year old male presents the emergency department with generalized fatigue, cough and reported fever.  He was febrile and hypoxic with EMS, currently comfortable on 2 L nasal cannula.  He is fatigued but nontoxic-appearing.  He had scattered wheezing on initial auscultation, steroids given and this has improved.  Work is reassuring and baseline, x-rays has bilateral findings concerning for  atypical  pneumonia, possibly COVID.  COVID swab is pending.  However in the setting of his generalized fatigue, inability to care for himself and hypoxia patient will require admission regardless.  Patient was treated for community-acquired pneumonia and admitted.  Final Clinical Impression(s) / ED Diagnoses Final diagnoses:  None    Rx / DC Orders ED Discharge Orders    None       Lorelle Gibbs, DO 10/31/20 4742

## 2020-10-30 NOTE — Telephone Encounter (Signed)
Left message for DSS on Hotline to return call to office.

## 2020-10-30 NOTE — Telephone Encounter (Signed)
Spoke with DSS intake officer today, there are previous reports filed on this patient from any family member or non family member at this time. As a nurse I provided the information given to PCP int he chart per conversation of daughter with contact information. A report will be filed from this office and investigated by DSS.

## 2020-10-30 NOTE — Telephone Encounter (Signed)
Noted. Thank you for filing the report.

## 2020-10-30 NOTE — ED Triage Notes (Signed)
Pt from home with [facility]. APS called for a wellness check, per pts daughter, the pt has not been eating or drinking much in the past 2 weeks, nor taking any of his medications. Pt had pacemaker placed last month. Pt c.o feeling weak. Febrile with ems 101.2 orally, given 650mg  tylenol around 515pm. BP 165/78, HR 60 paced, CBG 362. 88% on room air, 2L [place] applied. Rhonchi bilaterally. Hx of COPD and emphysema. Pt a.o upon arrival to ED

## 2020-10-31 ENCOUNTER — Encounter (HOSPITAL_COMMUNITY): Payer: Self-pay | Admitting: Internal Medicine

## 2020-10-31 DIAGNOSIS — E1165 Type 2 diabetes mellitus with hyperglycemia: Secondary | ICD-10-CM

## 2020-10-31 DIAGNOSIS — R531 Weakness: Secondary | ICD-10-CM | POA: Diagnosis present

## 2020-10-31 DIAGNOSIS — J9601 Acute respiratory failure with hypoxia: Secondary | ICD-10-CM | POA: Diagnosis present

## 2020-10-31 DIAGNOSIS — U071 COVID-19: Secondary | ICD-10-CM | POA: Diagnosis present

## 2020-10-31 DIAGNOSIS — Z825 Family history of asthma and other chronic lower respiratory diseases: Secondary | ICD-10-CM | POA: Diagnosis not present

## 2020-10-31 DIAGNOSIS — Z833 Family history of diabetes mellitus: Secondary | ICD-10-CM | POA: Diagnosis not present

## 2020-10-31 DIAGNOSIS — N1831 Chronic kidney disease, stage 3a: Secondary | ICD-10-CM

## 2020-10-31 DIAGNOSIS — I10 Essential (primary) hypertension: Secondary | ICD-10-CM

## 2020-10-31 DIAGNOSIS — R627 Adult failure to thrive: Secondary | ICD-10-CM | POA: Diagnosis present

## 2020-10-31 DIAGNOSIS — I129 Hypertensive chronic kidney disease with stage 1 through stage 4 chronic kidney disease, or unspecified chronic kidney disease: Secondary | ICD-10-CM | POA: Diagnosis present

## 2020-10-31 DIAGNOSIS — Z9842 Cataract extraction status, left eye: Secondary | ICD-10-CM | POA: Diagnosis not present

## 2020-10-31 DIAGNOSIS — I442 Atrioventricular block, complete: Secondary | ICD-10-CM | POA: Diagnosis present

## 2020-10-31 DIAGNOSIS — J432 Centrilobular emphysema: Secondary | ICD-10-CM

## 2020-10-31 DIAGNOSIS — E1151 Type 2 diabetes mellitus with diabetic peripheral angiopathy without gangrene: Secondary | ICD-10-CM | POA: Diagnosis present

## 2020-10-31 DIAGNOSIS — J440 Chronic obstructive pulmonary disease with acute lower respiratory infection: Secondary | ICD-10-CM | POA: Diagnosis present

## 2020-10-31 DIAGNOSIS — E785 Hyperlipidemia, unspecified: Secondary | ICD-10-CM | POA: Diagnosis present

## 2020-10-31 DIAGNOSIS — I482 Chronic atrial fibrillation, unspecified: Secondary | ICD-10-CM

## 2020-10-31 DIAGNOSIS — I495 Sick sinus syndrome: Secondary | ICD-10-CM | POA: Diagnosis present

## 2020-10-31 DIAGNOSIS — F17210 Nicotine dependence, cigarettes, uncomplicated: Secondary | ICD-10-CM

## 2020-10-31 DIAGNOSIS — E1122 Type 2 diabetes mellitus with diabetic chronic kidney disease: Secondary | ICD-10-CM | POA: Diagnosis present

## 2020-10-31 DIAGNOSIS — Z9841 Cataract extraction status, right eye: Secondary | ICD-10-CM | POA: Diagnosis not present

## 2020-10-31 DIAGNOSIS — E782 Mixed hyperlipidemia: Secondary | ICD-10-CM

## 2020-10-31 DIAGNOSIS — M1990 Unspecified osteoarthritis, unspecified site: Secondary | ICD-10-CM | POA: Diagnosis present

## 2020-10-31 DIAGNOSIS — E1169 Type 2 diabetes mellitus with other specified complication: Secondary | ICD-10-CM

## 2020-10-31 DIAGNOSIS — F419 Anxiety disorder, unspecified: Secondary | ICD-10-CM | POA: Diagnosis present

## 2020-10-31 DIAGNOSIS — J1282 Pneumonia due to coronavirus disease 2019: Secondary | ICD-10-CM

## 2020-10-31 DIAGNOSIS — N400 Enlarged prostate without lower urinary tract symptoms: Secondary | ICD-10-CM | POA: Diagnosis present

## 2020-10-31 DIAGNOSIS — Z950 Presence of cardiac pacemaker: Secondary | ICD-10-CM | POA: Diagnosis not present

## 2020-10-31 HISTORY — DX: COVID-19: U07.1

## 2020-10-31 LAB — D-DIMER, QUANTITATIVE: D-Dimer, Quant: 2.3 ug/mL-FEU — ABNORMAL HIGH (ref 0.00–0.50)

## 2020-10-31 LAB — CBG MONITORING, ED
Glucose-Capillary: 318 mg/dL — ABNORMAL HIGH (ref 70–99)
Glucose-Capillary: 370 mg/dL — ABNORMAL HIGH (ref 70–99)
Glucose-Capillary: 319 mg/dL — ABNORMAL HIGH (ref 70–99)
Glucose-Capillary: 200 mg/dL — ABNORMAL HIGH (ref 70–99)

## 2020-10-31 LAB — URINALYSIS, ROUTINE W REFLEX MICROSCOPIC
Bacteria, UA: NONE SEEN
Bilirubin Urine: NEGATIVE
Glucose, UA: 150 mg/dL — AB
Ketones, ur: NEGATIVE mg/dL
Nitrite: NEGATIVE
Protein, ur: 30 mg/dL — AB
Specific Gravity, Urine: 1.021 (ref 1.005–1.030)
pH: 5 (ref 5.0–8.0)

## 2020-10-31 LAB — FIBRINOGEN: Fibrinogen: 585 mg/dL — ABNORMAL HIGH (ref 210–475)

## 2020-10-31 LAB — CBC WITH DIFFERENTIAL/PLATELET
Abs Immature Granulocytes: 0.04 10*3/uL (ref 0.00–0.07)
Basophils Absolute: 0 10*3/uL (ref 0.0–0.1)
Basophils Relative: 0 %
Eosinophils Absolute: 0 10*3/uL (ref 0.0–0.5)
Eosinophils Relative: 0 %
HCT: 38 % — ABNORMAL LOW (ref 39.0–52.0)
Hemoglobin: 12 g/dL — ABNORMAL LOW (ref 13.0–17.0)
Immature Granulocytes: 1 %
Lymphocytes Relative: 8 %
Lymphs Abs: 0.4 10*3/uL — ABNORMAL LOW (ref 0.7–4.0)
MCH: 29.1 pg (ref 26.0–34.0)
MCHC: 31.6 g/dL (ref 30.0–36.0)
MCV: 92.2 fL (ref 80.0–100.0)
Monocytes Absolute: 0.1 10*3/uL (ref 0.1–1.0)
Monocytes Relative: 2 %
Neutro Abs: 4.4 10*3/uL (ref 1.7–7.7)
Neutrophils Relative %: 89 %
Platelets: 236 10*3/uL (ref 150–400)
RBC: 4.12 MIL/uL — ABNORMAL LOW (ref 4.22–5.81)
RDW: 13 % (ref 11.5–15.5)
WBC: 4.9 10*3/uL (ref 4.0–10.5)
nRBC: 0 % (ref 0.0–0.2)

## 2020-10-31 LAB — COMPREHENSIVE METABOLIC PANEL
ALT: 145 U/L — ABNORMAL HIGH (ref 0–44)
AST: 117 U/L — ABNORMAL HIGH (ref 15–41)
Albumin: 2.4 g/dL — ABNORMAL LOW (ref 3.5–5.0)
Alkaline Phosphatase: 104 U/L (ref 38–126)
Anion gap: 14 (ref 5–15)
BUN: 24 mg/dL — ABNORMAL HIGH (ref 8–23)
CO2: 21 mmol/L — ABNORMAL LOW (ref 22–32)
Calcium: 8.4 mg/dL — ABNORMAL LOW (ref 8.9–10.3)
Chloride: 103 mmol/L (ref 98–111)
Creatinine, Ser: 1.34 mg/dL — ABNORMAL HIGH (ref 0.61–1.24)
GFR, Estimated: 53 mL/min — ABNORMAL LOW (ref >60)
Glucose, Bld: 337 mg/dL — ABNORMAL HIGH (ref 70–99)
Potassium: 4.3 mmol/L (ref 3.5–5.1)
Sodium: 138 mmol/L (ref 135–145)
Total Bilirubin: 0.9 mg/dL (ref 0.3–1.2)
Total Protein: 5.7 g/dL — ABNORMAL LOW (ref 6.5–8.1)

## 2020-10-31 LAB — FERRITIN
Ferritin: 1679 ng/mL — ABNORMAL HIGH (ref 24–336)
Ferritin: 1331 ng/mL — ABNORMAL HIGH (ref 24–336)

## 2020-10-31 LAB — LACTATE DEHYDROGENASE: LDH: 291 U/L — ABNORMAL HIGH (ref 98–192)

## 2020-10-31 LAB — C-REACTIVE PROTEIN: CRP: 11.3 mg/dL — ABNORMAL HIGH (ref <1.0)

## 2020-10-31 LAB — BRAIN NATRIURETIC PEPTIDE: B Natriuretic Peptide: 206.3 pg/mL — ABNORMAL HIGH (ref 0.0–100.0)

## 2020-10-31 LAB — PROCALCITONIN: Procalcitonin: 0.15 ng/mL

## 2020-10-31 MED ORDER — ONDANSETRON HCL 4 MG PO TABS: 4.0000 mg | ORAL_TABLET | Freq: Four times a day (QID) | ORAL | Status: DC | PRN

## 2020-10-31 MED ORDER — LACTATED RINGERS IV BOLUS
500.0000 mL | Freq: Once | INTRAVENOUS | Status: AC
  Administered 2020-10-31: 500 mL via INTRAVENOUS

## 2020-10-31 MED ORDER — SODIUM CHLORIDE 0.9 % IV SOLN
100.0000 mg | Freq: Every day | INTRAVENOUS | Status: AC
  Administered 2020-11-01 – 2020-11-04 (×4): 100 mg via INTRAVENOUS
  Filled 2020-10-31 (×4): qty 20

## 2020-10-31 MED ORDER — SODIUM CHLORIDE 0.9 % IV SOLN
200.0000 mg | Freq: Once | INTRAVENOUS | Status: AC
  Administered 2020-10-31: 200 mg via INTRAVENOUS
  Filled 2020-10-31 (×2): qty 40

## 2020-10-31 MED ORDER — ACETAMINOPHEN 325 MG PO TABS: 650.0000 mg | ORAL_TABLET | Freq: Four times a day (QID) | ORAL | Status: DC | PRN

## 2020-10-31 MED ORDER — ALBUTEROL SULFATE HFA 108 (90 BASE) MCG/ACT IN AERS: 2.0000 | INHALATION_SPRAY | RESPIRATORY_TRACT | Status: DC | PRN

## 2020-10-31 MED ORDER — METHYLPREDNISOLONE SODIUM SUCC 40 MG IJ SOLR
40.0000 mg | Freq: Two times a day (BID) | INTRAMUSCULAR | Status: AC
  Administered 2020-10-31 – 2020-11-02 (×6): 40 mg via INTRAVENOUS
  Filled 2020-10-31 (×6): qty 1

## 2020-10-31 MED ORDER — POLYETHYLENE GLYCOL 3350 17 G PO PACK: 17.0000 g | PACK | Freq: Every day | ORAL | Status: DC | PRN

## 2020-10-31 MED ORDER — ONDANSETRON HCL 4 MG/2ML IJ SOLN: 4.0000 mg | Freq: Four times a day (QID) | INTRAMUSCULAR | Status: DC | PRN

## 2020-10-31 MED ORDER — LISINOPRIL 20 MG PO TABS
20.0000 mg | ORAL_TABLET | Freq: Every day | ORAL | Status: DC
  Administered 2020-10-31 – 2020-11-04 (×5): 20 mg via ORAL
  Filled 2020-10-31 (×5): qty 1

## 2020-10-31 MED ORDER — LINAGLIPTIN 5 MG PO TABS
5.0000 mg | ORAL_TABLET | Freq: Every day | ORAL | Status: DC
  Administered 2020-10-31 – 2020-11-04 (×5): 5 mg via ORAL
  Filled 2020-10-31 (×5): qty 1

## 2020-10-31 MED ORDER — ZINC SULFATE 220 (50 ZN) MG PO CAPS
220.0000 mg | ORAL_CAPSULE | Freq: Every day | ORAL | Status: DC
  Administered 2020-10-31 – 2020-11-04 (×5): 220 mg via ORAL
  Filled 2020-10-31 (×5): qty 1

## 2020-10-31 MED ORDER — AMIODARONE HCL 200 MG PO TABS
200.0000 mg | ORAL_TABLET | Freq: Every day | ORAL | Status: DC
  Administered 2020-10-31 – 2020-11-04 (×5): 200 mg via ORAL
  Filled 2020-10-31 (×5): qty 1

## 2020-10-31 MED ORDER — INSULIN ASPART 100 UNIT/ML ~~LOC~~ SOLN
0.0000 [IU] | Freq: Three times a day (TID) | SUBCUTANEOUS | Status: DC
  Administered 2020-10-31: 5 [IU] via SUBCUTANEOUS
  Administered 2020-10-31 (×2): 11 [IU] via SUBCUTANEOUS
  Administered 2020-10-31: 3 [IU] via SUBCUTANEOUS
  Administered 2020-11-01: 11 [IU] via SUBCUTANEOUS
  Administered 2020-11-01: 8 [IU] via SUBCUTANEOUS
  Administered 2020-11-01 (×2): 5 [IU] via SUBCUTANEOUS
  Administered 2020-11-02 (×2): 8 [IU] via SUBCUTANEOUS
  Administered 2020-11-02: 5 [IU] via SUBCUTANEOUS
  Administered 2020-11-02 – 2020-11-04 (×6): 3 [IU] via SUBCUTANEOUS
  Administered 2020-11-04: 5 [IU] via SUBCUTANEOUS

## 2020-10-31 MED ORDER — INSULIN GLARGINE 100 UNIT/ML ~~LOC~~ SOLN
10.0000 [IU] | Freq: Every day | SUBCUTANEOUS | Status: DC
  Administered 2020-10-31 – 2020-11-04 (×5): 10 [IU] via SUBCUTANEOUS
  Filled 2020-10-31 (×5): qty 0.1

## 2020-10-31 MED ORDER — ALBUTEROL SULFATE HFA 108 (90 BASE) MCG/ACT IN AERS
2.0000 | INHALATION_SPRAY | RESPIRATORY_TRACT | Status: DC | PRN
  Filled 2020-10-31: qty 6.7

## 2020-10-31 MED ORDER — ASCORBIC ACID 500 MG PO TABS
500.0000 mg | ORAL_TABLET | Freq: Every day | ORAL | Status: DC
  Administered 2020-10-31 – 2020-11-04 (×5): 500 mg via ORAL
  Filled 2020-10-31 (×5): qty 1

## 2020-10-31 MED ORDER — PREDNISONE 5 MG PO TABS
50.0000 mg | ORAL_TABLET | Freq: Every day | ORAL | Status: DC
  Administered 2020-11-03 – 2020-11-04 (×2): 50 mg via ORAL
  Filled 2020-10-31 (×2): qty 2

## 2020-10-31 MED ORDER — TAMSULOSIN HCL 0.4 MG PO CAPS
0.8000 mg | ORAL_CAPSULE | Freq: Every day | ORAL | Status: DC
  Administered 2020-10-31 – 2020-11-04 (×5): 0.8 mg via ORAL
  Filled 2020-10-31 (×5): qty 2

## 2020-10-31 MED ORDER — ALBUTEROL SULFATE HFA 108 (90 BASE) MCG/ACT IN AERS
2.0000 | INHALATION_SPRAY | Freq: Four times a day (QID) | RESPIRATORY_TRACT | Status: DC
  Administered 2020-10-31 (×3): 2 via RESPIRATORY_TRACT
  Filled 2020-10-31: qty 6.7

## 2020-10-31 MED ORDER — LACTATED RINGERS IV SOLN: INTRAVENOUS | Status: DC

## 2020-10-31 MED ORDER — ROSUVASTATIN CALCIUM 20 MG PO TABS
20.0000 mg | ORAL_TABLET | Freq: Every day | ORAL | Status: DC
  Administered 2020-10-31 – 2020-11-04 (×5): 20 mg via ORAL
  Filled 2020-10-31 (×4): qty 1
  Filled 2020-10-31: qty 4

## 2020-10-31 MED ORDER — GUAIFENESIN-DM 100-10 MG/5ML PO SYRP: 10.0000 mL | ORAL_SOLUTION | ORAL | Status: DC | PRN

## 2020-10-31 MED ORDER — APIXABAN 5 MG PO TABS
5.0000 mg | ORAL_TABLET | Freq: Two times a day (BID) | ORAL | Status: DC
  Administered 2020-10-31 – 2020-11-04 (×9): 5 mg via ORAL
  Filled 2020-10-31 (×9): qty 1

## 2020-10-31 MED ORDER — LATANOPROST 0.005 % OP SOLN
1.0000 [drp] | Freq: Every day | OPHTHALMIC | Status: DC
  Administered 2020-11-01 – 2020-11-03 (×3): 1 [drp] via OPHTHALMIC
  Filled 2020-10-31: qty 2.5

## 2020-10-31 NOTE — ED Notes (Signed)
Family updated.

## 2020-10-31 NOTE — ED Notes (Signed)
Lunch Tray Ordered @ 1033. 

## 2020-10-31 NOTE — ED Notes (Signed)
Admitting MD at bedside.

## 2020-10-31 NOTE — ED Notes (Signed)
Pt increased to 3L

## 2020-10-31 NOTE — ED Notes (Signed)
Lunch tray given patient sat up .

## 2020-10-31 NOTE — ED Notes (Signed)
Patient appetite fair.

## 2020-10-31 NOTE — ED Notes (Signed)
Pt  Alert c/o some chest pain  Vitals ok  Pacemaker pacing every beat

## 2020-10-31 NOTE — Progress Notes (Signed)
PROGRESS NOTE  Zachary Santos  DOB: February 04, 1936  PCP: Leone Haven, MD [id number]  DOA: 10/30/2020  LOS: 0 days   Chief Complaint  Patient presents with  . Weakness  . Failure To Thrive    Brief narrative: Zachary Santos is a 85 y.o. male with PMH significant for DM2, HTN, HLD, CKD 3A, A. fib, BPH and a recent diagnosis of third-degree heart block/sick sinus syndrome requiring pacemaker placement (09/2020). Patient presented to the ED on 1/13 with several days history of progressively worsening productive cough, shortness of breath, generalized weakness, poor appetite.   Unvaccinated for [place]. EMS found patient to have temperature 101.2, oxygen saturation low at 88% on room air, started on nasal cannula and brought to ED.  In the ED, patient was positive for COVID 19.   He required up to 4 L oxygen at home to maintain saturation more than 90%. Chest x-ray showed bilateral peripheral and subpleural streaky densities likely atypical infection. Patient was admitted to hospitalist service for further evaluation management.  Subjective: Patient was seen and examined this morning. Elderly Caucasian male.  Propped up in bed.  Not in distress.  Feels better than at presentation.  Still feels dehydrated though.  Assessment/Plan: COVID pneumonia Acute respiratory failure with hypoxia  -Presented with worsening shortness of breath, cough, hypoxia -COVID test: PCR positive -Chest imaging: Bilateral peripheral and subpleural densities  -Treatment: Currently on a 5-day course of IV remdesivir till 1/18, IV Solu-Medrol 40 mg twice daily -Oxygen - SpO2: 92 % O2 Flow Rate (L/min): 4 L/min  -Supportive care: Vitamin C, Zinc, PRN inhalers, Tylenol, Antitussives (benzonatate/ Mucinex/Tussionex).   -Encouraged incentive spirometry, prone position, out of bed and early mobilization as much as possible -Continue airborne/contact isolation precautions for duration of 3 weeks from the day of  diagnosis. -WBC and inflammatory markers trend as below.  Continue to trend. Recent Labs  Lab 10/30/20 1839 10/31/20 0630  SARSCOV2NAA POSITIVE*  --   WBC 5.3 4.9  PROCALCITON  --  0.15  DDIMER  --  2.30*  FERRITIN  --  1,679*  LDH  --  291*  CRP  --  11.3*  ALT 154* 145*   The treatment plan and use of medications and known side effects were discussed with patient/family. Some of the medications used are based on case reports/anecdotal data.  All other medications being used in the management of COVID-19 based on limited study data.  Complete risks and long-term side effects are unknown, however in the best clinical judgment they seem to be of some benefit.  Patient wanted to proceed with treatment options provided.  T2DM with hyperglycemia -A1c 8.6 on 10/07/2020. -Home meds include metformin 500 mg twice daily and Tradjenta 5 mg daily. -Currently both on hold and patient is on sliding scale insulin. -Blood sugar level significantly up to 370 this morning and is expected to rise with IV steroids. -I will resume Tradjenta and start the patient on Lantus. Recent Labs  Lab 10/31/20 0751 10/31/20 1150  GLUCAP 318* 370*   Atrial fibrillation, chronic -Continue amiodarone and Eliquis. -Continue monitoring telemetry.  Hyperlipidemia -Continue statin  CKD 3 -Renal function at baseline. Recent Labs    05/16/20 1450 05/27/20 1324 10/04/20 1627 10/06/20 1857 10/07/20 0515 10/08/20 0538 10/30/20 1839 10/31/20 0630  BUN 14 17 15 19 20 21 23  24*  CREATININE 1.46* 1.41 1.34* 1.46* 1.49* 1.41* 1.33* 1.34*   COPD -Continue progressive trend steroids.  Current everyday smoker -Counseled to quit.  BPH -Continuing home regimen of Flomax  Mobility: Encourage ambulation.  PT eval ordered. Code Status:   Code Status: Full Code  Nutritional status: Body mass index is 23.47 kg/m.     Diet Order            Diet heart healthy/carb modified Room service appropriate? Yes;  Fluid consistency: Thin  Diet effective now                 DVT prophylaxis:  apixaban (ELIQUIS) tablet 5 mg   Antimicrobials:  None Fluid: Stop IV fluid.  Encourage oral hydration. Consultants: None Family Communication:  None at bedside  Status is: Inpatient   Remains inpatient appropriate because: Ongoing treatment for COVID-19 pneumonia  Dispo: The patient is from: Home              Anticipated d/c is to: Home likely              Anticipated d/c date is: > 3 days              Patient currently is not medically stable to d/c.       Infusions:  . [START ON 11/01/2020] remdesivir 100 mg in NS 100 mL      Scheduled Meds: . albuterol  2 puff Inhalation Q6H  . amiodarone  200 mg Oral Daily  . apixaban  5 mg Oral BID  . vitamin C  500 mg Oral Daily  . insulin aspart  0-15 Units Subcutaneous TID AC & HS  . insulin glargine  10 Units Subcutaneous Daily  . latanoprost  1 drop Both Eyes QHS  . linagliptin  5 mg Oral Daily  . lisinopril  20 mg Oral Daily  . methylPREDNISolone (SOLU-MEDROL) injection  40 mg Intravenous BID   Followed by  . [START ON 11/03/2020] predniSONE  50 mg Oral Daily  . rosuvastatin  20 mg Oral Daily  . tamsulosin  0.8 mg Oral Daily  . zinc sulfate  220 mg Oral Daily    Antimicrobials: Anti-infectives (From admission, onward)   Start     Dose/Rate Route Frequency Ordered Stop   11/01/20 1000  remdesivir 100 mg in sodium chloride 0.9 % 100 mL IVPB       "Followed by" Linked Group Details   100 mg 200 mL/hr over 30 Minutes Intravenous Daily 10/31/20 0509 11/05/20 0959   10/31/20 0600  remdesivir 200 mg in sodium chloride 0.9% 250 mL IVPB       "Followed by" Linked Group Details   200 mg 580 mL/hr over 30 Minutes Intravenous Once 10/31/20 0509 10/31/20 0733   10/30/20 2315  azithromycin (ZITHROMAX) 500 mg in sodium chloride 0.9 % 250 mL IVPB        500 mg 250 mL/hr over 60 Minutes Intravenous  Once 10/30/20 2313 10/31/20 0112   10/30/20 2315   cefTRIAXone (ROCEPHIN) 1 g in sodium chloride 0.9 % 100 mL IVPB        1 g 200 mL/hr over 30 Minutes Intravenous  Once 10/30/20 2313 10/31/20 0051      PRN meds: acetaminophen, albuterol, guaiFENesin-dextromethorphan, ondansetron **OR** ondansetron (ZOFRAN) IV, polyethylene glycol   Objective: Vitals:   10/31/20 1100 10/31/20 1306  BP: (!) 143/77 (!) 182/76  Pulse:  60  Resp:  20  Temp:  (!) 97.3 F (36.3 C)  SpO2:  92%    Intake/Output Summary (Last 24 hours) at 10/31/2020 1410 Last data filed at 10/31/2020 1132 Gross per 24 hour  Intake 1350 ml  Output --  Net 1350 ml   Filed Weights   10/30/20 1755  Weight: 78.5 kg   Weight change:  Body mass index is 23.47 kg/m.   Physical Exam: General exam: Pleasant elderly Caucasian male.  Not in distress while on oxygen Skin: No rashes, lesions or ulcers. HEENT: Atraumatic, normocephalic, no obvious bleeding Lungs: Diminished air entry in both bases.  Otherwise clear to auscultation CVS: Regular rate and rhythm, no murmur GI/Abd soft, nontender, nondistended, bowel sound present CNS: Alert, awake, oriented x3 Psychiatry: Mood appropriate Extremities: No pedal edema, no calf tenderness  Data Review: I have personally reviewed the laboratory data and studies available.  Recent Labs  Lab 10/30/20 1839 10/31/20 0630  WBC 5.3 4.9  NEUTROABS 4.1 4.4  HGB 12.1* 12.0*  HCT 36.5* 38.0*  MCV 91.0 92.2  PLT 234 236   Recent Labs  Lab 10/30/20 1839 10/31/20 0630  NA 138 138  K 3.9 4.3  CL 103 103  CO2 25 21*  GLUCOSE 235* 337*  BUN 23 24*  CREATININE 1.33* 1.34*  CALCIUM 8.1* 8.4*    F/u labs ordered  Signed, Terrilee Croak, MD [facility] 10/31/2020

## 2020-10-31 NOTE — ED Notes (Signed)
Assisted up to [facility] , [place] on stretcher changed.

## 2020-10-31 NOTE — H&P (Signed)
History and Physical    Zachary Santos HYW:[id number] DOB: April 21, 1936 DOA: 10/30/2020  PCP: Leone Haven, MD  Patient coming from: Home via EMS   Chief Complaint:  Chief Complaint  Patient presents with  . Weakness  . Failure To Thrive     HPI:    85 year old male with past medical history of hypertension, hyperlipidemia, diabetes mellitus type 2, COPD, chronic kidney disease stage IIIa, atrial fibrillation, benign prostatic hyperplasia and recent diagnosis of third-degree heart block/sick sinus syndrome requiring pacemaker placement (09/2020) presenting with several days of progressively worsening weakness and cough.  Patient explains that approximately 1 week ago he began to develop a cough.  This cough was initially mild and not associated with any sputum production.  Symptoms rapidly worsened in the next several days with cough becoming progressively more and more severe.  His cough became associated with shortness of breath, worse with exertion and improved with rest.  Patient additionally experienced generalized weakness and poor appetite.  Patient also began to experience episodes of subjective fevers.  Patient denies recent travel, sick contacts or confirmed contact with COVID-19 infection.  Of note, patient is unvaccinated for COVID-19.  Patient's symptoms continue to worsen until EMS was contacted.  On EMS arrival, patient was found to be slightly febrile at 101.2 F.  Patient was also found to be mildly hypoxic at 88% on room air and was placed on nasal cannula. The patient was promptly brought into [facility] emergency department for evaluation.  Upon evaluation in the emergency patient COVID PCR testing was found to be positive.  Patient was found to rapidly become more and more hypoxic throughout the emergency department course.  Patient was given a dose of intravenous ceftriaxone and azithromycin by the emergency department provider.  Hospitalist group was then  called to assess the patient for admission of the hospital.  Review of Systems:   Review of Systems  Constitutional: Positive for chills, fever and malaise/fatigue.  Respiratory: Positive for cough and shortness of breath.   Neurological: Positive for weakness.  All other systems reviewed and are negative.   Past Medical History:  Diagnosis Date  . Anxiety   . BPH (benign prostatic hyperplasia)   . Chronic fatigue   . CKD (chronic kidney disease), stage III ([place])   . Claudication [facility])    a. R Hip.  [name] COPD (chronic obstructive pulmonary disease) ([place])   . Dyspnea    with exertion  . Essential hypertension    a. 01/2008 Echo: EF 65%, no rwma, mod increased PASP.  [name] Hypercholesterolemia   . Osteoarthritis   . Paget's disease   . Syncope    a. 03/2015 in setting of hypotension.  . Tobacco abuse    a. 1-1.5 ppd x 60+ yrs.  . Type II diabetes mellitus ([place])   . Wears partial dentures    uppers    Past Surgical History:  Procedure Laterality Date  . APPENDECTOMY    . CATARACT EXTRACTION W/PHACO Left 12/11/2019   Procedure: CATARACT EXTRACTION PHACO AND INTRAOCULAR LENS PLACEMENT ([place]) LEFT DIABETIC MALYUGIN;  Surgeon: Birder Robson, MD;  Location: [place];  Service: Ophthalmology;  Laterality: Left;  CDE 11.45 U/S  1:05.7  . CATARACT EXTRACTION W/PHACO Right 01/08/2020   Procedure: CATARACT EXTRACTION PHACO AND INTRAOCULAR LENS PLACEMENT ([facility]) RIGHT DIABETIC 5.75  00:49.3;  Surgeon: Birder Robson, MD;  Location: [place];  Service: Ophthalmology;  Laterality: Right;  Diabetic  . laparscopic chole    .  PACEMAKER IMPLANT N/A 10/08/2020   Procedure: PACEMAKER IMPLANT;  Surgeon: Vickie Epley, MD;  Location: [place] CV LAB;  Service: Cardiovascular;  Laterality: N/A;  . SPINE SURGERY     cervical spine surgery  . trigeminal neuralgia     with surgery in [place]     reports that he has been smoking cigarettes. He has a 90.00  pack-year smoking history. He has quit using smokeless tobacco. He reports previous alcohol use. He reports that he does not use drugs.  No Known Allergies  Family History  Problem Relation Age of Onset  . Cancer Mother        died @ 51.  [name] COPD Father        died @ 70.  . Diabetes Brother        died in early 110's.     Prior to Admission medications   Medication Sig Start Date End Date Taking? Authorizing Provider  amiodarone (PACERONE) 200 MG tablet Take 1 tablet by mouth two times a day for one week, then take 1 tablet by mouth once daily. 06/30/20   Kate Sable, MD  apixaban (ELIQUIS) 5 MG TABS tablet Take 1 tablet (5 mg total) by mouth 2 (two) times daily. 09/10/20   Leone Haven, MD  blood glucose meter kit and supplies KIT Dispense based on patient and insurance preference. Use up to four times daily as directed. (FOR ICD-9 250.00, 250.01). 09/16/20   Leone Haven, MD  latanoprost (XALATAN) 0.005 % ophthalmic solution INSTILL 1 DROP INTO EACH EYE AT BEDTIME 07/18/19   [provider]  lisinopril (ZESTRIL) 20 MG tablet Take 1 tablet (20 mg total) by mouth daily. 09/10/20   Leone Haven, MD  metFORMIN (GLUCOPHAGE XR) 500 MG 24 hr tablet Take 1 tablet (500 mg total) by mouth in the morning and at bedtime. 09/10/20   Leone Haven, MD  ONE TOUCH ULTRA TEST test strip USE TEST STRIP TO CHECK GLUCOSE AT LEAST ONCE DAILY 05/06/16   [name], DO  rosuvastatin (CRESTOR) 20 MG tablet Take 1 tablet by mouth once daily 08/05/20   Leone Haven, MD  tamsulosin [facility]) 0.4 MG CAPS capsule Take 2 capsules by mouth once daily 12/05/19   Leone Haven, MD  TRAZODONE HCL PO Take by mouth as needed.    [provider]  triamcinolone (KENALOG) 0.1 % Apply 1 application topically 2 (two) times daily. 10/20/20   Leone Haven, MD  vitamin B-12 (CYANOCOBALAMIN) 500 MCG tablet Take 500 mcg by mouth daily.    [provider]     Physical Exam: Vitals:   10/31/20 0600 10/31/20 0630 10/31/20 0700 10/31/20 0734  BP: (!) 169/79 (!) 143/69 (!) 181/97 (!) 184/84  Pulse: (!) 59 (!) 59 60 60  Resp: '20 20 17 18  ' Temp:    (!) 97.2 F (36.2 C)  TempSrc:    Oral  SpO2: 100% 93% 96% 95%  Weight:      Height:        Constitutional: Patient is lethargic but arousable and oriented x3.  Patient is currently in respiratory distress. Skin: no rashes, no lesions, poor skin turgor noted. Eyes: Pupils are equally reactive to light.  No evidence of scleral icterus or conjunctival pallor.  ENMT: Dry mucous membranes noted.  Posterior pharynx clear of any exudate or lesions.   Neck: normal, supple, no masses, no thyromegaly.  No evidence of jugular venous distension.  Respiratory: Notable bilateral mid and lower fields rales with intermittent expiratory wheezing noted.  Patient is tachypneic without any evidence of accessory muscle use.  Cardiovascular: Regular rate and rhythm, no murmurs / rubs / gallops. No extremity edema. 2+ pedal pulses. No carotid bruits.  Chest:   Nontender without crepitus or deformity.   Back:   Nontender without crepitus or deformity. Abdomen: Abdomen is soft and nontender.  No evidence of intra-abdominal masses.  Positive bowel sounds noted in all quadrants.   Musculoskeletal: No joint deformity upper and lower extremities. Good ROM, no contractures poor muscle tone. Neurologic: CN 2-12 grossly intact. Sensation intact.  Patient moving all 4 extremities spontaneously.  Patient is following all commands.  Patient is responsive to verbal stimuli.   Psychiatric: Patient exhibits normal mood with appropriate affect.  Patient seems to possess insight as to their current situation.     Labs on Admission: I have personally reviewed following labs and imaging studies -   CBC: Recent Labs  Lab 10/30/20 1839 10/31/20 0630  WBC 5.3 4.9  NEUTROABS 4.1 PENDING  HGB 12.1* 12.0*  HCT 36.5* 38.0*  MCV 91.0  92.2  PLT 234 656   Basic Metabolic Panel: Recent Labs  Lab 10/30/20 1839  NA 138  K 3.9  CL 103  CO2 25  GLUCOSE 235*  BUN 23  CREATININE 1.33*  CALCIUM 8.1*   GFR: Estimated Creatinine Clearance: 47 mL/min (A) (by C-G formula based on SCr of 1.33 mg/dL (H)). Liver Function Tests: Recent Labs  Lab 10/30/20 1839  AST 181*  ALT 154*  ALKPHOS 100  BILITOT 1.0  PROT 5.6*  ALBUMIN 2.4*   No results for input(s): LIPASE, AMYLASE in the last 168 hours. No results for input(s): AMMONIA in the last 168 hours. Coagulation Profile: No results for input(s): INR, PROTIME in the last 168 hours. Cardiac Enzymes: No results for input(s): CKTOTAL, CKMB, CKMBINDEX, TROPONINI in the last 168 hours. BNP (last 3 results) No results for input(s): PROBNP in the last 8760 hours. HbA1C: No results for input(s): HGBA1C in the last 72 hours. CBG: Recent Labs  Lab 10/31/20 0751  GLUCAP 318*   Lipid Profile: No results for input(s): CHOL, HDL, LDLCALC, TRIG, CHOLHDL, LDLDIRECT in the last 72 hours. Thyroid Function Tests: No results for input(s): TSH, T4TOTAL, FREET4, T3FREE, THYROIDAB in the last 72 hours. Anemia Panel: No results for input(s): VITAMINB12, FOLATE, FERRITIN, TIBC, IRON, RETICCTPCT in the last 72 hours. Urine analysis:    Component Value Date/Time   COLORURINE YELLOW 10/31/2020 0630   APPEARANCEUR CLEAR 10/31/2020 0630   APPEARANCEUR Clear 08/05/2014 2214   LABSPEC 1.021 10/31/2020 0630   LABSPEC 1.012 08/05/2014 2214   PHURINE 5.0 10/31/2020 0630   GLUCOSEU 150 (A) 10/31/2020 0630   GLUCOSEU Negative 08/05/2014 2214   GLUCOSEU NEG mg/dL 02/02/2010 1815   HGBUR SMALL (A) 10/31/2020 0630   BILIRUBINUR NEGATIVE 10/31/2020 0630   BILIRUBINUR Negative 08/05/2014 2214   KETONESUR NEGATIVE 10/31/2020 0630   PROTEINUR 30 (A) 10/31/2020 0630   UROBILINOGEN 0.2 02/02/2010 1815   NITRITE NEGATIVE 10/31/2020 0630   LEUKOCYTESUR TRACE (A) 10/31/2020 0630   LEUKOCYTESUR  Negative 08/05/2014 2214    Radiological Exams on Admission - Personally Reviewed: DG Chest Port 1 View  Result Date: 10/30/2020 CLINICAL DATA:  85 year old male with cough EXAM: PORTABLE CHEST 1 VIEW COMPARISON:  Chest radiograph dated 10/08/2020. FINDINGS: Bilateral peripheral and subpleural streaky densities, likely sequela of atypical infection including COVID-19. Clinical correlation is recommended. No  pleural effusion pneumothorax. Mild cardiomegaly. Left pectoral pacemaker device. No acute osseous pathology. IMPRESSION: Bilateral peripheral and subpleural streaky densities, likely sequela of atypical infection. Clinical correlation is recommended. Electronically Signed   By: Anner Crete M.D.   On: 10/30/2020 19:18    EKG: Personally reviewed.  Rhythm is paced rhythm with heart rate of 60 bpm.  No dynamic ST segment changes appreciated.  Assessment/Plan Principal Problem:   Acute respiratory failure with hypoxia [facility])   Patient presenting with acute hypoxic respiratory failure that is gradually worsening throughout the patient's stay in the emergency department  Patient's been found to be positive for COVID, likely the primary driver of the patient's respiratory symptoms  No clinical evidence of underlying pulmonary emboli at this time  Furthermore, I have no reason to believe that patient currently has a bacterial coinfection at this time.  Treating underlying COVID-19 infection with a combination of intravenous remdesivir and intravenous Solu-Medrol  Patient's oxygen requirements have been gradually increasing throughout the emergency department stay, patient is now transitioning to nonrebreather mask.  If patient's oxygen requirements continue to increase will consider initiation of Baricitinib or Tocilizumab  Admitting patient to COVID-19 unit  As needed bronchodilator therapy via MDI  As needed antitussives  Daily zinc and vitamin C supplementation  Close  clinical monitoring.  Active Problems:   COVID-19 virus infection   Please see assessment and plan above    Essential hypertension, benign   Continue home regimen of lisinopril    COPD (chronic obstructive pulmonary disease) ([place])   Lung examination does not seem consistent with COPD exacerbation  Regardless, patient is already receiving bronchodilator therapy and steroids.    Type 2 diabetes mellitus with hyperglycemia, without long-term current use of insulin (HCC)   Accu-Cheks before every meal and nightly with sliding scale insulin  Holding home oral hypoglycemics  Hemoglobin A1c pending    Chronic kidney disease, stage 3a (HCC)   Renal function near baseline  Strict input and output monitoring  Avoiding nephrotoxic agents if at all possible  Perform serial chemistries to monitor renal function and electrolytes    Atrial fibrillation, chronic ([place])   Continuing home regimen of Eliquis for anticoagulation  Continue home regimen of amiodarone  Monitoring patient on telemetry    Mixed diabetic hyperlipidemia associated with type 2 diabetes mellitus ([place])   Continuing home regimen of statin therapy    Nicotine dependence, cigarettes, uncomplicated   Counseled patient on cessation daily  Provided patient with nicotine replacement therapy upon request    BPH (benign prostatic hyperplasia)    Continuing home regimen of Flomax  Code Status:  Full code Family Communication: deferred   Status is: Inpatient  Remains inpatient appropriate because:Ongoing diagnostic testing needed not appropriate for outpatient work up, IV treatments appropriate due to intensity of illness or inability to take PO and Inpatient level of care appropriate due to severity of illness   Dispo: The patient is from: Home              Anticipated d/c is to: Home              Anticipated d/c date is: > 3 days              Patient currently is not medically stable to  d/c.        Vernelle Emerald MD [facility] Pager [phone]  If 7PM-7AM, please contact night-coverage [email or web address] Use universal [place] password for that web site. If you  do not have the password, please call the hospital operator.  10/31/2020, 8:20 AM

## 2020-10-31 NOTE — ED Notes (Signed)
Pt on 4L oxygen

## 2020-10-31 NOTE — ED Notes (Signed)
Med given 

## 2020-10-31 NOTE — Evaluation (Signed)
Physical Therapy Evaluation Patient Details Name: Zachary Santos MRN: [id number] DOB: 1936/02/22 Today's Date: 10/31/2020   History of Present Illness  Pt is a 85 y.o. male with PMH significant for DM2, HTN, HLD, CKD 3A, A. fib, BPH and a recent diagnosis of third-degree heart block/sick sinus syndrome requiring pacemaker placement (09/2020).  Patient presented to the ED on 1/13 with several days history of progressively worsening productive cough, shortness of breath, generalized weakness, poor appetite.    Unvaccinated for [place].  Pt admitted with resp failure due to COVID PNE  Clinical Impression   Pt admitted with above diagnosis. Pt was seen in ED and generally fatigued.  He was able to transfer with supervision and ambulated a few steps with min A but fatigued easily.  He was on 4 LPM O2 with sats dropping to 86% but quick recovery.  Pt is normally independent at home but does report a decline in function due to recent medical conditions (recent pacemaker placed).  Pt expected to progress well for transfer home with family when medically ready. Pt currently with functional limitations due to the deficits listed below (see PT Problem List). Pt will benefit from skilled PT to increase their independence and safety with mobility to allow discharge to the venue listed below.       Follow Up Recommendations Home health PT;Supervision for mobility/OOB    Equipment Recommendations  Other (comment) (RW vs rollator pending progress)    Recommendations for Other Services       Precautions / Restrictions Precautions Precautions: Fall Precaution Comments: recent pacemaker      Mobility  Bed Mobility Overal bed mobility: Needs Assistance Bed Mobility: Supine to Sit;Sit to Supine     Supine to sit: Min guard Sit to supine: Min guard        Transfers Overall transfer level: Needs assistance Equipment used: None Transfers: Sit to/from [facility] Sit to Stand: Min  guard Stand pivot transfers: Min guard       General transfer comment: Sit to stand x 2; stand pivot to/from bsc  Ambulation/Gait Ambulation/Gait assistance: Min assist Gait Distance (Feet): 2 Feet Assistive device: 1 person hand held assist Gait Pattern/deviations: Step-to pattern;Decreased stride length Gait velocity: decreased   General Gait Details: side steps up HOB; min A to steady; further limited due to fatigue/seen in ED  Stairs            Wheelchair Mobility    Modified Rankin (Stroke Patients Only)       Balance Overall balance assessment: Needs assistance Sitting-balance support: Bilateral upper extremity supported;No upper extremity supported Sitting balance-Leahy Scale: Fair     Standing balance support: Single extremity supported;No upper extremity supported Standing balance-Leahy Scale: Fair Standing balance comment: static stand without UE support; single support during ambulation                             Pertinent Vitals/Pain Pain Assessment: No/denies pain    Home Living Family/patient expects to be discharged to:: Private residence Living Arrangements: Spouse/significant other Available Help at Discharge: Family;Available 24 hours/day Type of Home: House Home Access: Stairs to enter   [employer] of Steps: 1 Home Layout: One level Home Equipment: Shower seat;Grab bars - tub/shower;Bedside commode      Prior Function Level of Independence: Needs assistance         Comments: Reports independent with driving, shopping, walking, and ADLs up until last 2 month.  Reports he has been sick last couple months and had a couple falls related to needing pacemaker.  Since that time still doing adls and ambulation but daughter assisting w iadls.     Hand Dominance        Extremity/Trunk Assessment   Upper Extremity Assessment Upper Extremity Assessment: Generalized weakness    Lower Extremity Assessment Lower  Extremity Assessment: Generalized weakness    Cervical / Trunk Assessment Cervical / Trunk Assessment: Kyphotic  Communication   Communication: No difficulties  Cognition Arousal/Alertness: Awake/alert Behavior During Therapy: WFL for tasks assessed/performed Overall Cognitive Status: Within Functional Limits for tasks assessed                                        General Comments General comments (skin integrity, edema, etc.): Pt on 4 LPM O2 with sats 95% rest and 86% with activity but quickly recovers.  BP in supine 152/96, some c/o initial lightheadedness with sitting BP 125/58    Exercises     Assessment/Plan    PT Assessment Patient needs continued PT services  PT Problem List Decreased strength;Decreased mobility;Decreased activity tolerance;Cardiopulmonary status limiting activity;Decreased balance;Decreased knowledge of use of DME       PT Treatment Interventions DME instruction;Therapeutic activities;Gait training;Therapeutic exercise;Patient/family education;Balance training;Functional mobility training    PT Goals (Current goals can be found in the Care Plan section)  Acute Rehab PT Goals Patient Stated Goal: return home once better PT Goal Formulation: With patient Time For Goal Achievement: 11/14/20 Potential to Achieve Goals: Good    Frequency Min 3X/week   Barriers to discharge        Co-evaluation               AM-PAC PT "6 Clicks" Mobility  Outcome Measure Help needed turning from your back to your side while in a flat bed without using bedrails?: A Little Help needed moving from lying on your back to sitting on the side of a flat bed without using bedrails?: A Little Help needed moving to and from a bed to a chair (including a wheelchair)?: A Little Help needed standing up from a chair using your arms (e.g., wheelchair or bedside chair)?: A Little Help needed to walk in hospital room?: A Little Help needed climbing 3-5 steps  with a railing? : A Little 6 Click Score: 18    End of Session Equipment Utilized During Treatment: Gait belt Activity Tolerance: Patient limited by fatigue Patient left: in bed;with call bell/phone within reach (ED stretcher) Nurse Communication: Mobility status PT Visit Diagnosis: Muscle weakness (generalized) (M62.81);Other abnormalities of gait and mobility (R26.89)    Time: 3329-5188 PT Time Calculation (min) (ACUTE ONLY): 25 min   Charges:   PT Evaluation $PT Eval Moderate Complexity: 1 Mod PT Treatments $Therapeutic Activity: 8-22 mins        Abran Richard, PT Acute Rehab Services Pager [phone] [facility] [phone]    Karlton Lemon 10/31/2020, 6:05 PM

## 2020-11-01 LAB — BLOOD CULTURE ID PANEL (REFLEXED) - BCID2

## 2020-11-01 LAB — CBC WITH DIFFERENTIAL/PLATELET
Abs Immature Granulocytes: 0.09 10*3/uL — ABNORMAL HIGH (ref 0.00–0.07)
Basophils Absolute: 0 10*3/uL (ref 0.0–0.1)
Basophils Relative: 0 %
Eosinophils Absolute: 0 10*3/uL (ref 0.0–0.5)
Eosinophils Relative: 0 %
HCT: 34.4 % — ABNORMAL LOW (ref 39.0–52.0)
Hemoglobin: 11.5 g/dL — ABNORMAL LOW (ref 13.0–17.0)
Immature Granulocytes: 1 %
Lymphocytes Relative: 5 %
Lymphs Abs: 0.6 10*3/uL — ABNORMAL LOW (ref 0.7–4.0)
MCH: 30.4 pg (ref 26.0–34.0)
MCHC: 33.4 g/dL (ref 30.0–36.0)
MCV: 91 fL (ref 80.0–100.0)
Monocytes Absolute: 0.5 10*3/uL (ref 0.1–1.0)
Monocytes Relative: 5 %
Neutro Abs: 10.3 10*3/uL — ABNORMAL HIGH (ref 1.7–7.7)
Neutrophils Relative %: 89 %
Platelets: 265 10*3/uL (ref 150–400)
RBC: 3.78 MIL/uL — ABNORMAL LOW (ref 4.22–5.81)
RDW: 13.2 % (ref 11.5–15.5)
WBC: 11.4 10*3/uL — ABNORMAL HIGH (ref 4.0–10.5)
nRBC: 0 % (ref 0.0–0.2)

## 2020-11-01 LAB — D-DIMER, QUANTITATIVE: D-Dimer, Quant: 1.69 ug/mL-FEU — ABNORMAL HIGH (ref 0.00–0.50)

## 2020-11-01 LAB — COMPREHENSIVE METABOLIC PANEL
ALT: 121 U/L — ABNORMAL HIGH (ref 0–44)
AST: 71 U/L — ABNORMAL HIGH (ref 15–41)
Albumin: 2.4 g/dL — ABNORMAL LOW (ref 3.5–5.0)
Alkaline Phosphatase: 108 U/L (ref 38–126)
Anion gap: 10 (ref 5–15)
BUN: 31 mg/dL — ABNORMAL HIGH (ref 8–23)
CO2: 24 mmol/L (ref 22–32)
Calcium: 8.3 mg/dL — ABNORMAL LOW (ref 8.9–10.3)
Chloride: 104 mmol/L (ref 98–111)
Creatinine, Ser: 1.27 mg/dL — ABNORMAL HIGH (ref 0.61–1.24)
GFR, Estimated: 56 mL/min — ABNORMAL LOW (ref >60)
Glucose, Bld: 281 mg/dL — ABNORMAL HIGH (ref 70–99)
Potassium: 4 mmol/L (ref 3.5–5.1)
Sodium: 138 mmol/L (ref 135–145)
Total Bilirubin: 0.7 mg/dL (ref 0.3–1.2)
Total Protein: 5.4 g/dL — ABNORMAL LOW (ref 6.5–8.1)

## 2020-11-01 LAB — FERRITIN: Ferritin: 1169 ng/mL — ABNORMAL HIGH (ref 24–336)

## 2020-11-01 LAB — GLUCOSE, CAPILLARY
Glucose-Capillary: 319 mg/dL — ABNORMAL HIGH (ref 70–99)
Glucose-Capillary: 272 mg/dL — ABNORMAL HIGH (ref 70–99)
Glucose-Capillary: 203 mg/dL — ABNORMAL HIGH (ref 70–99)
Glucose-Capillary: 208 mg/dL — ABNORMAL HIGH (ref 70–99)

## 2020-11-01 LAB — MAGNESIUM: Magnesium: 2 mg/dL (ref 1.7–2.4)

## 2020-11-01 LAB — C-REACTIVE PROTEIN: CRP: 6.7 mg/dL — ABNORMAL HIGH (ref <1.0)

## 2020-11-01 MED ORDER — ADULT MULTIVITAMIN W/MINERALS CH
1.0000 | ORAL_TABLET | Freq: Every day | ORAL | Status: DC
  Administered 2020-11-01 – 2020-11-04 (×4): 1 via ORAL
  Filled 2020-11-01 (×4): qty 1

## 2020-11-01 MED ORDER — ENSURE ENLIVE PO LIQD
237.0000 mL | Freq: Two times a day (BID) | ORAL | Status: DC
  Administered 2020-11-01 – 2020-11-03 (×4): 237 mL via ORAL

## 2020-11-01 MED ORDER — AMLODIPINE BESYLATE 5 MG PO TABS
5.0000 mg | ORAL_TABLET | Freq: Every day | ORAL | Status: DC
  Administered 2020-11-01 – 2020-11-04 (×4): 5 mg via ORAL
  Filled 2020-11-01 (×4): qty 1

## 2020-11-01 MED ORDER — ORAL CARE MOUTH RINSE
15.0000 mL | Freq: Two times a day (BID) | OROMUCOSAL | Status: DC
  Administered 2020-11-01 – 2020-11-04 (×7): 15 mL via OROMUCOSAL

## 2020-11-01 MED ORDER — METFORMIN HCL 500 MG PO TABS
500.0000 mg | ORAL_TABLET | Freq: Two times a day (BID) | ORAL | Status: DC
  Administered 2020-11-01 – 2020-11-04 (×6): 500 mg via ORAL
  Filled 2020-11-01 (×6): qty 1

## 2020-11-01 NOTE — Progress Notes (Signed)
PROGRESS NOTE  Zachary Santos  DOB: [birth year]  PCP: Zachary Haven, MD SEG:[id number]  DOA: 10/30/2020  LOS: 1 day   Chief Complaint  Patient presents with  . Weakness  . Failure To Thrive    Brief narrative: Zachary Santos is a 85 y.o. male with PMH significant for DM2, HTN, HLD, CKD 3A, A. fib, BPH and a recent diagnosis of third-degree heart block/sick sinus syndrome requiring pacemaker placement (09/2020). Patient presented to the ED on 1/13 with several days history of progressively worsening productive cough, shortness of breath, generalized weakness, poor appetite.   Unvaccinated for [place]. EMS found patient to have temperature 101.2, oxygen saturation low at 88% on room air, started on nasal cannula and brought to ED.  In the ED, patient was positive for COVID 19.   He required up to 4 L oxygen at home to maintain saturation more than 90%. Chest x-ray showed bilateral peripheral and subpleural streaky densities likely atypical infection. Patient was admitted to hospitalist service for further evaluation management.  Subjective: Patient was seen and examined this morning. Sitting up in chair.  Not in distress.  On 4 L oxygen by nasal collar.  Feels much better than at presentation. He wanted to make sure I tell the nurse states that he is very happy with the care. Blood pressure running elevated to 180s this morning.  Assessment/Plan: COVID pneumonia Acute respiratory failure with hypoxia  -Presented with worsening shortness of breath, cough, hypoxia -COVID test: PCR positive -Chest imaging: Bilateral peripheral and subpleural densities -Treatment: Currently on a 5-day course of IV remdesivir till 1/18, IV Solu-Medrol 40 mg twice daily.  CRP improving.  I would continue the same medicines for today. -Oxygen - SpO2: 90 % O2 Flow Rate (L/min): 4 L/min -Supportive care: Vitamin C, Zinc, PRN inhalers, Tylenol, Antitussives (benzonatate/ Mucinex/Tussionex).   -Encouraged  incentive spirometry, prone position, out of bed and early mobilization as much as possible -Continue airborne/contact isolation precautions for duration of 3 weeks from the day of diagnosis. -WBC and inflammatory markers trend as below.  Continue to trend. Recent Labs  Lab 10/30/20 1839 10/31/20 0630 10/31/20 1510 11/01/20 0320  SARSCOV2NAA POSITIVE*  --   --   --   WBC 5.3 4.9  --  11.4*  PROCALCITON  --  0.15  --   --   DDIMER  --  2.30*  --  1.69*  FERRITIN  --  1,679* 1,331* 1,169*  LDH  --  291*  --   --   CRP  --  11.3*  --  6.7*  ALT 154* 145*  --  121*   The treatment plan and use of medications and known side effects were discussed with patient/family. Some of the medications used are based on case reports/anecdotal data.  All other medications being used in the management of COVID-19 based on limited study data.  Complete risks and long-term side effects are unknown, however in the best clinical judgment they seem to be of some benefit.  Patient wanted to proceed with treatment options provided.  T2DM with hyperglycemia -A1c 8.6 on 10/07/2020. -Home meds include metformin 500 mg twice daily and Tradjenta 5 mg daily. -Currently on Lantus 10 units daily.  Continue Tradjenta.  Because of steroids, his blood sugar level probably would continue to run high.  Resume metformin.  Recent Labs  Lab 10/31/20 0751 10/31/20 1150 10/31/20 1711 10/31/20 2147 11/01/20 0751  GLUCAP 318* 370* 319* 200* 319*   Atrial fibrillation, chronic -  Continue amiodarone and Eliquis. -Continue monitoring telemetry.  Essential hypertension -Was on lisinopril at home. -Continue the same.  Blood pressure however elevated up to 180s this morning.  I will add amlodipine 5 mg daily.  Hyperlipidemia -Continue statin  CKD 3 -Renal function at baseline. Recent Labs    05/16/20 1450 05/27/20 1324 10/04/20 1627 10/06/20 1857 10/07/20 0515 10/08/20 0538 10/30/20 1839 10/31/20 0630  11/01/20 0320  BUN 14 17 15 19 20 21 23  24* 31*  CREATININE 1.46* 1.41 1.34* 1.46* 1.49* 1.41* 1.33* 1.34* 1.27*   COPD -Continue progressive trend steroids.  Current everyday smoker -Counseled to quit.  BPH -Continuing home regimen of Flomax  Mobility: Encourage ambulation.  PT recommended home health PT. Code Status:   Code Status: Full Code  Nutritional status: Body mass index is 21.98 kg/m.     Diet Order            Diet heart healthy/carb modified Room service appropriate? Yes; Fluid consistency: Thin  Diet effective now                 DVT prophylaxis:  apixaban (ELIQUIS) tablet 5 mg   Antimicrobials:  None Fluid: Stop IV fluid. Consultants: None Family Communication:  None at bedside  Status is: Inpatient   Remains inpatient appropriate because: Ongoing treatment for COVID-19 pneumonia  Dispo: The patient is from: Home              Anticipated d/c is to: Home likely              Anticipated d/c date is: > 3 days              Patient currently is not medically stable to d/c.   Infusions:  . remdesivir 100 mg in NS 100 mL 100 mg (11/01/20 0843)    Scheduled Meds: . amiodarone  200 mg Oral Daily  . amLODipine  5 mg Oral Daily  . apixaban  5 mg Oral BID  . vitamin C  500 mg Oral Daily  . feeding supplement  237 mL Oral BID BM  . insulin aspart  0-15 Units Subcutaneous TID AC & HS  . insulin glargine  10 Units Subcutaneous Daily  . latanoprost  1 drop Both Eyes QHS  . linagliptin  5 mg Oral Daily  . lisinopril  20 mg Oral Daily  . mouth rinse  15 mL Mouth Rinse BID  . metFORMIN  500 mg Oral BID WC  . methylPREDNISolone (SOLU-MEDROL) injection  40 mg Intravenous BID   Followed by  . [START ON 11/03/2020] predniSONE  50 mg Oral Daily  . rosuvastatin  20 mg Oral Daily  . tamsulosin  0.8 mg Oral Daily  . zinc sulfate  220 mg Oral Daily    Antimicrobials: Anti-infectives (From admission, onward)   Start     Dose/Rate Route Frequency Ordered Stop    11/01/20 1000  remdesivir 100 mg in sodium chloride 0.9 % 100 mL IVPB       "Followed by" Linked Group Details   100 mg 200 mL/hr over 30 Minutes Intravenous Daily 10/31/20 0509 11/05/20 0959   10/31/20 0600  remdesivir 200 mg in sodium chloride 0.9% 250 mL IVPB       "Followed by" Linked Group Details   200 mg 580 mL/hr over 30 Minutes Intravenous Once 10/31/20 0509 10/31/20 0733   10/30/20 2315  azithromycin (ZITHROMAX) 500 mg in sodium chloride 0.9 % 250 mL IVPB  500 mg 250 mL/hr over 60 Minutes Intravenous  Once 10/30/20 2313 10/31/20 0112   10/30/20 2315  cefTRIAXone (ROCEPHIN) 1 g in sodium chloride 0.9 % 100 mL IVPB        1 g 200 mL/hr over 30 Minutes Intravenous  Once 10/30/20 2313 10/31/20 0051      PRN meds: acetaminophen, albuterol, guaiFENesin-dextromethorphan, ondansetron **OR** ondansetron (ZOFRAN) IV, polyethylene glycol   Objective: Vitals:   11/01/20 0700 11/01/20 0827  BP: (!) 181/76   Pulse: 60   Resp: 16   Temp: 97.7 F (36.5 C)   SpO2: 92% 90%    Intake/Output Summary (Last 24 hours) at 11/01/2020 1121 Last data filed at 10/31/2020 1132 Gross per 24 hour  Intake 1000 ml  Output --  Net 1000 ml   Filed Weights   10/30/20 1755 11/01/20 0457  Weight: 78.5 kg 73.5 kg   Weight change: -5 kg Body mass index is 21.98 kg/m.   Physical Exam: General exam: Pleasant elderly Caucasian male.  Not in distress while on oxygen Skin: No rashes, lesions or ulcers. HEENT: Atraumatic, normocephalic, no obvious bleeding Lungs: Clear to auscultation bilaterally.  Coughs on deep breathing. CVS: Regular rate and rhythm, no murmur GI/Abd soft, nontender, nondistended, bowel sound present CNS: Alert, awake, oriented x3 Psychiatry: Mood appropriate Extremities: No pedal edema, no calf tenderness  Data Review: I have personally reviewed the laboratory data and studies available.  Recent Labs  Lab 10/30/20 1839 10/31/20 0630 11/01/20 0320  WBC 5.3 4.9  11.4*  NEUTROABS 4.1 4.4 10.3*  HGB 12.1* 12.0* 11.5*  HCT 36.5* 38.0* 34.4*  MCV 91.0 92.2 91.0  PLT 234 236 265   Recent Labs  Lab 10/30/20 1839 10/31/20 0630 11/01/20 0320  NA 138 138 138  K 3.9 4.3 4.0  CL 103 103 104  CO2 25 21* 24  GLUCOSE 235* 337* 281*  BUN 23 24* 31*  CREATININE 1.33* 1.34* 1.27*  CALCIUM 8.1* 8.4* 8.3*  MG  --   --  2.0    F/u labs ordered  Signed, Terrilee Croak, MD [facility] 11/01/2020

## 2020-11-01 NOTE — Progress Notes (Signed)
Initial Nutrition Assessment  DOCUMENTATION CODES:   Not applicable  INTERVENTION:   -Ensure Enlive po BID, each supplement provides 350 kcal and 20 grams of protein -MVI with minerals daily -Magic cup TID with meals, each supplement provides 290 kcal and 9 grams of protein  NUTRITION DIAGNOSIS:   Increased nutrient needs related to acute illness (COVID-19) as evidenced by estimated needs.  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  Pt admitted with acute respiratory failure with hypoxia and COVID-19 virus infection.   Reviewed I/O's: +1 L x 24 hours and +1.4 L since admission  Pt unavailable at time of attempted contact.  No meal completion documented. Per H&P, pt with poor appetite PTA. Per RN notes, pt with fair appetite.    Reviewed wt hx; pt has experienced a 7.9% wt loss over the past month, which is significant for time frame.   Pt with poor oral intake and would benefit from nutrient dense supplement. One Ensure Enlive supplement provides 350 kcals, 20 grams protein, and 44-45 grams of carbohydrate vs one Glucerna shake supplement, which provides 220 kcals, 10 grams of protein, and 26 grams of carbohydrate. Given pt's hx of DM, RD will reassess adequacy of PO intake, CBGS, and adjust supplement regimen as appropriate at follow-up.   Medications reviewed and include vitamin C, solu-medrol, zinc sulfate, and remdesivir.    Lab Results  Component Value Date   HGBA1C 8.6 (H) 10/07/2020   PTA DM medications are 500 mg metformin BID.   Labs reviewed: CBGS: 200-319 (inpatient orders for glycemic control are 5 mg linagliptin daily,  500 mg metformin BID, 0-15 units insulin aspart TID before meals, and bedtime and 10 units insulin glargine daily).   Diet Order:   Diet Order            Diet heart healthy/carb modified Room service appropriate? Yes; Fluid consistency: Thin  Diet effective now                 EDUCATION NEEDS:   No education needs have been identified at this time  Skin:  Skin Assessment: Reviewed RN Assessment  Last BM:  10/29/20  Height:   Ht Readings from Last 1 Encounters:  11/01/20 6' (1.829 m)    Weight:   Wt Readings from Last 1 Encounters:  11/01/20 73.5 kg    Ideal Body Weight:  80.9 kg  BMI:  Body mass index is 21.98 kg/m.  Estimated Nutritional Needs:   Kcal:  2200-2400  Protein:  110-125 grams  Fluid:  > 2 L    Loistine Chance, RD, LDN, [place] Registered Dietitian II Certified Diabetes Care and Education Specialist Please refer to [facility] for RD and/or RD on-call/weekend/after hours pager

## 2020-11-01 NOTE — Progress Notes (Signed)
PHARMACY - PHYSICIAN COMMUNICATION CRITICAL VALUE ALERT - BLOOD CULTURE IDENTIFICATION (BCID)  Zachary Santos is an 85 y.o. male who presented to [facility] on 10/30/2020 with a chief complaint of weakness/FTT  Assessment:  44 YOM with COVID PNA and receiving Remdesivir. Now with 1 of 4 blood cultures growing meth-sensitive staph species, likely representative of contamination. The patient is noted to have a pacemaker implanted on 10/08/20 - so the site will need to be reviewed per MD but vitals appear stable.   Name of physician (or Provider) Contacted: [place]  Current antibiotics: None - Remdesivir for COVID  Changes to prescribed antibiotics recommended:  None - likely representative of contamination. MD states ICD site appears clean. Will continue to monitor blood cultures to make sure no additional sets turn positive.  Results for orders placed or performed during the hospital encounter of 10/30/20  Blood Culture ID Panel (Reflexed) (Collected: 10/31/2020  6:30 AM)  Result Value Ref Range   Enterococcus faecalis NOT DETECTED NOT DETECTED   Enterococcus Faecium NOT DETECTED NOT DETECTED   Listeria monocytogenes NOT DETECTED NOT DETECTED   Staphylococcus species DETECTED (A) NOT DETECTED   Staphylococcus aureus (BCID) NOT DETECTED NOT DETECTED   Staphylococcus epidermidis NOT DETECTED NOT DETECTED   Staphylococcus lugdunensis NOT DETECTED NOT DETECTED   Streptococcus species NOT DETECTED NOT DETECTED   Streptococcus agalactiae NOT DETECTED NOT DETECTED   Streptococcus pneumoniae NOT DETECTED NOT DETECTED   Streptococcus pyogenes NOT DETECTED NOT DETECTED   A.calcoaceticus-baumannii NOT DETECTED NOT DETECTED   Bacteroides fragilis NOT DETECTED NOT DETECTED   Enterobacterales NOT DETECTED NOT DETECTED   Enterobacter cloacae complex NOT DETECTED NOT DETECTED   Escherichia coli NOT DETECTED NOT DETECTED   Klebsiella aerogenes NOT DETECTED NOT DETECTED   Klebsiella oxytoca NOT DETECTED  NOT DETECTED   Klebsiella pneumoniae NOT DETECTED NOT DETECTED   Proteus species NOT DETECTED NOT DETECTED   Salmonella species NOT DETECTED NOT DETECTED   Serratia marcescens NOT DETECTED NOT DETECTED   Haemophilus influenzae NOT DETECTED NOT DETECTED   Neisseria meningitidis NOT DETECTED NOT DETECTED   Pseudomonas aeruginosa NOT DETECTED NOT DETECTED   Stenotrophomonas maltophilia NOT DETECTED NOT DETECTED   Candida albicans NOT DETECTED NOT DETECTED   Candida auris NOT DETECTED NOT DETECTED   Candida glabrata NOT DETECTED NOT DETECTED   Candida krusei NOT DETECTED NOT DETECTED   Candida parapsilosis NOT DETECTED NOT DETECTED   Candida tropicalis NOT DETECTED NOT DETECTED   Cryptococcus neoformans/gattii NOT DETECTED NOT DETECTED    Lawson Radar 11/01/2020  11:04 AM

## 2020-11-02 LAB — COMPREHENSIVE METABOLIC PANEL
ALT: 110 U/L — ABNORMAL HIGH (ref 0–44)
AST: 53 U/L — ABNORMAL HIGH (ref 15–41)
Albumin: 2.5 g/dL — ABNORMAL LOW (ref 3.5–5.0)
Alkaline Phosphatase: 92 U/L (ref 38–126)
Anion gap: 11 (ref 5–15)
BUN: 34 mg/dL — ABNORMAL HIGH (ref 8–23)
CO2: 25 mmol/L (ref 22–32)
Calcium: 8.7 mg/dL — ABNORMAL LOW (ref 8.9–10.3)
Chloride: 106 mmol/L (ref 98–111)
Creatinine, Ser: 1.21 mg/dL (ref 0.61–1.24)
GFR, Estimated: 60 mL/min — ABNORMAL LOW (ref >60)
Glucose, Bld: 164 mg/dL — ABNORMAL HIGH (ref 70–99)
Potassium: 4.3 mmol/L (ref 3.5–5.1)
Sodium: 142 mmol/L (ref 135–145)
Total Bilirubin: 0.8 mg/dL (ref 0.3–1.2)
Total Protein: 5.8 g/dL — ABNORMAL LOW (ref 6.5–8.1)

## 2020-11-02 LAB — D-DIMER, QUANTITATIVE: D-Dimer, Quant: 1.33 ug/mL-FEU — ABNORMAL HIGH (ref 0.00–0.50)

## 2020-11-02 LAB — CBC WITH DIFFERENTIAL/PLATELET
Abs Immature Granulocytes: 0.12 10*3/uL — ABNORMAL HIGH (ref 0.00–0.07)
Basophils Absolute: 0 10*3/uL (ref 0.0–0.1)
Basophils Relative: 0 %
Eosinophils Absolute: 0 10*3/uL (ref 0.0–0.5)
Eosinophils Relative: 0 %
HCT: 34.2 % — ABNORMAL LOW (ref 39.0–52.0)
Hemoglobin: 11.5 g/dL — ABNORMAL LOW (ref 13.0–17.0)
Immature Granulocytes: 1 %
Lymphocytes Relative: 5 %
Lymphs Abs: 0.6 10*3/uL — ABNORMAL LOW (ref 0.7–4.0)
MCH: 30.3 pg (ref 26.0–34.0)
MCHC: 33.6 g/dL (ref 30.0–36.0)
MCV: 90.2 fL (ref 80.0–100.0)
Monocytes Absolute: 0.5 10*3/uL (ref 0.1–1.0)
Monocytes Relative: 4 %
Neutro Abs: 12.6 10*3/uL — ABNORMAL HIGH (ref 1.7–7.7)
Neutrophils Relative %: 90 %
Platelets: 286 10*3/uL (ref 150–400)
RBC: 3.79 MIL/uL — ABNORMAL LOW (ref 4.22–5.81)
RDW: 13.2 % (ref 11.5–15.5)
WBC: 13.8 10*3/uL — ABNORMAL HIGH (ref 4.0–10.5)
nRBC: 0 % (ref 0.0–0.2)

## 2020-11-02 LAB — GLUCOSE, CAPILLARY
Glucose-Capillary: 174 mg/dL — ABNORMAL HIGH (ref 70–99)
Glucose-Capillary: 212 mg/dL — ABNORMAL HIGH (ref 70–99)
Glucose-Capillary: 260 mg/dL — ABNORMAL HIGH (ref 70–99)
Glucose-Capillary: 239 mg/dL — ABNORMAL HIGH (ref 70–99)
Glucose-Capillary: 274 mg/dL — ABNORMAL HIGH (ref 70–99)

## 2020-11-02 LAB — C-REACTIVE PROTEIN: CRP: 4 mg/dL — ABNORMAL HIGH (ref <1.0)

## 2020-11-02 LAB — CULTURE, BLOOD (ROUTINE X 2): Special Requests: ADEQUATE

## 2020-11-02 LAB — MAGNESIUM: Magnesium: 2.3 mg/dL (ref 1.7–2.4)

## 2020-11-02 LAB — FERRITIN: Ferritin: 923 ng/mL — ABNORMAL HIGH (ref 24–336)

## 2020-11-02 NOTE — Progress Notes (Signed)
PROGRESS NOTE  Zachary Santos  DOB: 1936/09/06  PCP: Glori Luis, MD VBT:[id number]  DOA: 10/30/2020  LOS: 2 days   Chief Complaint  Patient presents with  . Weakness  . Failure To Thrive    Brief narrative: Zachary Santos is a 85 y.o. male with PMH significant for DM2, HTN, HLD, CKD 3A, A. fib, BPH and a recent diagnosis of third-degree heart block/sick sinus syndrome requiring pacemaker placement (09/2020). Patient presented to the ED on 1/13 with several days history of progressively worsening productive cough, shortness of breath, generalized weakness, poor appetite.   Unvaccinated for [place]. EMS found patient to have temperature 101.2, oxygen saturation low at 88% on room air, started on nasal cannula and brought to ED.  In the ED, patient was positive for COVID 19.   He required up to 4 L oxygen at home to maintain saturation more than 90%. Chest x-ray showed bilateral peripheral and subpleural streaky densities likely atypical infection. Patient was admitted to hospitalist service for further evaluation management.  Subjective: Patient was seen and examined this morning. Sitting up in chair.  Not in distress.  Continues to remain on 4 L oxygen by nasal cannula.   Continues to feel better.  Blood pressure in 150s.  Assessment/Plan: COVID pneumonia Acute respiratory failure with hypoxia  -Presented with worsening shortness of breath, cough, hypoxia -COVID test: PCR positive -Chest imaging: Bilateral peripheral and subpleural densities -Treatment: Currently on a 5-day course of IV remdesivir till 1/18, IV Solu-Medrol 40 mg twice daily.  CRP improving.  I would continue the same treatment plan today.  Check ambulatory oxygen requirement. -Oxygen - SpO2: 92 % O2 Flow Rate (L/min): 4 L/min -Supportive care: Vitamin C, Zinc, PRN inhalers, Tylenol, Antitussives (benzonatate/ Mucinex/Tussionex).   -Encouraged incentive spirometry, prone position, out of bed and early  mobilization as much as possible -Continue airborne/contact isolation precautions for duration of 3 weeks from the day of diagnosis. -WBC and inflammatory markers trend as below.  Continue to trend. Recent Labs  Lab 10/30/20 1839 10/31/20 0630 10/31/20 1510 11/01/20 0320 11/02/20 0304  SARSCOV2NAA POSITIVE*  --   --   --   --   WBC 5.3 4.9  --  11.4* 13.8*  PROCALCITON  --  0.15  --   --   --   DDIMER  --  2.30*  --  1.69* 1.33*  FERRITIN  --  1,679* 1,331* 1,169* 923*  LDH  --  291*  --   --   --   CRP  --  11.3*  --  6.7* 4.0*  ALT 154* 145*  --  121* 110*   The treatment plan and use of medications and known side effects were discussed with patient/family. Some of the medications used are based on case reports/anecdotal data.  All other medications being used in the management of COVID-19 based on limited study data.  Complete risks and long-term side effects are unknown, however in the best clinical judgment they seem to be of some benefit.  Patient wanted to proceed with treatment options provided.  T2DM with hyperglycemia -A1c 8.6 on 10/07/2020. -Home meds include metformin 500 mg twice daily and Tradjenta 5 mg daily. -Continue both.  Currently blood sugar level is running high because of steroids.  So patient is also on Lantus 10 units daily and sliding scale insulin with Accu-Cheks. Recent Labs  Lab 11/01/20 0751 11/01/20 1226 11/01/20 1734 11/01/20 2042 11/02/20 0738  GLUCAP 319* 272* 203* 208* 174*  Atrial fibrillation, chronic -Continue amiodarone and Eliquis. -Continue monitoring telemetry.  Essential hypertension -blood pressure is improving on lisinopril and amlodipine.  Continue to monitor  Hyperlipidemia -Continue statin  CKD 3 -Renal function at baseline. Recent Labs    05/16/20 1450 05/27/20 1324 10/04/20 1627 10/06/20 1857 10/07/20 0515 10/08/20 0538 10/30/20 1839 10/31/20 0630 11/01/20 0320 11/02/20 0304  BUN 14 17 15 19 20 21 23  24* 31*  34*  CREATININE 1.46* 1.41 1.34* 1.46* 1.49* 1.41* 1.33* 1.34* 1.27* 1.21   COPD -Continue progressive trend steroids.  Current everyday smoker -Counseled to quit.  BPH -Continuing home regimen of Flomax  Mobility: Encourage ambulation.  PT recommended home health PT. Code Status:   Code Status: Full Code  Nutritional status: Body mass index is 21.98 kg/m. Nutrition Problem: Increased nutrient needs Etiology: acute illness (COVID-19) Signs/Symptoms: estimated needs Diet Order            Diet heart healthy/carb modified Room service appropriate? Yes; Fluid consistency: Thin  Diet effective now                 DVT prophylaxis:  apixaban (ELIQUIS) tablet 5 mg   Antimicrobials:  None Fluid: Not on IV fluid Consultants: None Family Communication:  None at bedside.  I asked if he wants me to call anybody in the family.  He said 'no.'  Status is: Inpatient   Remains inpatient appropriate because: Ongoing treatment for COVID-19 pneumonia  Dispo: The patient is from: Home              Anticipated d/c is to: Home likely              Anticipated d/c date is: 2 to 3 days              Patient currently is not medically stable to d/c.  Infusions:  . remdesivir 100 mg in NS 100 mL 100 mg (11/02/20 0840)    Scheduled Meds: . amiodarone  200 mg Oral Daily  . amLODipine  5 mg Oral Daily  . apixaban  5 mg Oral BID  . vitamin C  500 mg Oral Daily  . feeding supplement  237 mL Oral BID BM  . insulin aspart  0-15 Units Subcutaneous TID AC & HS  . insulin glargine  10 Units Subcutaneous Daily  . latanoprost  1 drop Both Eyes QHS  . linagliptin  5 mg Oral Daily  . lisinopril  20 mg Oral Daily  . mouth rinse  15 mL Mouth Rinse BID  . metFORMIN  500 mg Oral BID WC  . methylPREDNISolone (SOLU-MEDROL) injection  40 mg Intravenous BID   Followed by  . [START ON 11/03/2020] predniSONE  50 mg Oral Daily  . multivitamin with minerals  1 tablet Oral Daily  . rosuvastatin  20 mg Oral  Daily  . tamsulosin  0.8 mg Oral Daily  . zinc sulfate  220 mg Oral Daily    Antimicrobials: Anti-infectives (From admission, onward)   Start     Dose/Rate Route Frequency Ordered Stop   11/01/20 1000  remdesivir 100 mg in sodium chloride 0.9 % 100 mL IVPB       "Followed by" Linked Group Details   100 mg 200 mL/hr over 30 Minutes Intravenous Daily 10/31/20 0509 11/05/20 0959   10/31/20 0600  remdesivir 200 mg in sodium chloride 0.9% 250 mL IVPB       "Followed by" Linked Group Details   200 mg 580 mL/hr over 30  Minutes Intravenous Once 10/31/20 0509 10/31/20 0733   10/30/20 2315  azithromycin (ZITHROMAX) 500 mg in sodium chloride 0.9 % 250 mL IVPB        500 mg 250 mL/hr over 60 Minutes Intravenous  Once 10/30/20 2313 10/31/20 0112   10/30/20 2315  cefTRIAXone (ROCEPHIN) 1 g in sodium chloride 0.9 % 100 mL IVPB        1 g 200 mL/hr over 30 Minutes Intravenous  Once 10/30/20 2313 10/31/20 0051      PRN meds: acetaminophen, albuterol, guaiFENesin-dextromethorphan, ondansetron **OR** ondansetron (ZOFRAN) IV, polyethylene glycol   Objective: Vitals:   11/01/20 1946 11/02/20 0500  BP: (!) 154/69 (!) 156/70  Pulse: 60 60  Resp: 20 19  Temp: (!) 97.3 F (36.3 C) (!) 96.9 F (36.1 C)  SpO2: 95% 92%    Intake/Output Summary (Last 24 hours) at 11/02/2020 1106 Last data filed at 11/02/2020 0539 Gross per 24 hour  Intake 120 ml  Output --  Net 120 ml   Filed Weights   10/30/20 1755 11/01/20 0457  Weight: 78.5 kg 73.5 kg   Weight change:  Body mass index is 21.98 kg/m.   Physical Exam: General exam: Pleasant elderly Caucasian male.  Not in distress while on oxygen Skin: No rashes, lesions or ulcers. HEENT: Atraumatic, normocephalic, no obvious bleeding Lungs: Clear to auscultation bilaterally.  Coughs on deep breathing. CVS: Regular rate and rhythm, no murmur GI/Abd soft, nontender, nondistended, bowel sound present CNS: Alert, awake, oriented x3 Psychiatry: Mood  appropriate Extremities: No pedal edema, no calf tenderness  Data Review: I have personally reviewed the laboratory data and studies available.  Recent Labs  Lab 10/30/20 1839 10/31/20 0630 11/01/20 0320 11/02/20 0304  WBC 5.3 4.9 11.4* 13.8*  NEUTROABS 4.1 4.4 10.3* 12.6*  HGB 12.1* 12.0* 11.5* 11.5*  HCT 36.5* 38.0* 34.4* 34.2*  MCV 91.0 92.2 91.0 90.2  PLT 234 236 265 286   Recent Labs  Lab 10/30/20 1839 10/31/20 0630 11/01/20 0320 11/02/20 0304  NA 138 138 138 142  K 3.9 4.3 4.0 4.3  CL 103 103 104 106  CO2 25 21* 24 25  GLUCOSE 235* 337* 281* 164*  BUN 23 24* 31* 34*  CREATININE 1.33* 1.34* 1.27* 1.21  CALCIUM 8.1* 8.4* 8.3* 8.7*  MG  --   --  2.0 2.3    F/u labs ordered  Signed, Terrilee Croak, MD [facility] 11/02/2020

## 2020-11-03 LAB — COMPREHENSIVE METABOLIC PANEL
ALT: 90 U/L — ABNORMAL HIGH (ref 0–44)
AST: 38 U/L (ref 15–41)
Albumin: 2.5 g/dL — ABNORMAL LOW (ref 3.5–5.0)
Alkaline Phosphatase: 101 U/L (ref 38–126)
Anion gap: 10 (ref 5–15)
BUN: 39 mg/dL — ABNORMAL HIGH (ref 8–23)
CO2: 25 mmol/L (ref 22–32)
Calcium: 8.4 mg/dL — ABNORMAL LOW (ref 8.9–10.3)
Chloride: 105 mmol/L (ref 98–111)
Creatinine, Ser: 1.26 mg/dL — ABNORMAL HIGH (ref 0.61–1.24)
GFR, Estimated: 57 mL/min — ABNORMAL LOW (ref >60)
Glucose, Bld: 144 mg/dL — ABNORMAL HIGH (ref 70–99)
Potassium: 4 mmol/L (ref 3.5–5.1)
Sodium: 140 mmol/L (ref 135–145)
Total Bilirubin: 0.8 mg/dL (ref 0.3–1.2)
Total Protein: 5.5 g/dL — ABNORMAL LOW (ref 6.5–8.1)

## 2020-11-03 LAB — CBC WITH DIFFERENTIAL/PLATELET
Abs Immature Granulocytes: 0.11 10*3/uL — ABNORMAL HIGH (ref 0.00–0.07)
Basophils Absolute: 0 10*3/uL (ref 0.0–0.1)
Basophils Relative: 0 %
Eosinophils Absolute: 0 10*3/uL (ref 0.0–0.5)
Eosinophils Relative: 0 %
HCT: 32.2 % — ABNORMAL LOW (ref 39.0–52.0)
Hemoglobin: 10.9 g/dL — ABNORMAL LOW (ref 13.0–17.0)
Immature Granulocytes: 1 %
Lymphocytes Relative: 5 %
Lymphs Abs: 0.6 10*3/uL — ABNORMAL LOW (ref 0.7–4.0)
MCH: 30.6 pg (ref 26.0–34.0)
MCHC: 33.9 g/dL (ref 30.0–36.0)
MCV: 90.4 fL (ref 80.0–100.0)
Monocytes Absolute: 0.6 10*3/uL (ref 0.1–1.0)
Monocytes Relative: 5 %
Neutro Abs: 10.4 10*3/uL — ABNORMAL HIGH (ref 1.7–7.7)
Neutrophils Relative %: 89 %
Platelets: 251 10*3/uL (ref 150–400)
RBC: 3.56 MIL/uL — ABNORMAL LOW (ref 4.22–5.81)
RDW: 13.3 % (ref 11.5–15.5)
WBC: 11.7 10*3/uL — ABNORMAL HIGH (ref 4.0–10.5)
nRBC: 0 % (ref 0.0–0.2)

## 2020-11-03 LAB — GLUCOSE, CAPILLARY
Glucose-Capillary: 169 mg/dL — ABNORMAL HIGH (ref 70–99)
Glucose-Capillary: 151 mg/dL — ABNORMAL HIGH (ref 70–99)
Glucose-Capillary: 186 mg/dL — ABNORMAL HIGH (ref 70–99)
Glucose-Capillary: 190 mg/dL — ABNORMAL HIGH (ref 70–99)

## 2020-11-03 LAB — MAGNESIUM: Magnesium: 2.3 mg/dL (ref 1.7–2.4)

## 2020-11-03 LAB — D-DIMER, QUANTITATIVE: D-Dimer, Quant: 1.21 ug/mL-FEU — ABNORMAL HIGH (ref 0.00–0.50)

## 2020-11-03 LAB — C-REACTIVE PROTEIN: CRP: 2.5 mg/dL — ABNORMAL HIGH (ref <1.0)

## 2020-11-03 MED ORDER — FUROSEMIDE 10 MG/ML IJ SOLN
20.0000 mg | Freq: Once | INTRAMUSCULAR | Status: AC
  Administered 2020-11-03: 20 mg via INTRAVENOUS
  Filled 2020-11-03: qty 2

## 2020-11-03 NOTE — Progress Notes (Signed)
Physical Therapy Treatment Patient Details Name: Zachary Santos MRN: [id number] DOB: [birth year] Today's Date: 11/03/2020    History of Present Illness Pt is an 85 y.o. male admitted 10/30/20 with cough, SOB, weakness and poor appetite. Workup for acute hypoxic respiratory failure due to COVID-19 PNA. PMH includes DM2, HTN, CKD 3, afib, 3rd degree heart block/sick sinus syndrome s/p pacemaker (09/2020).   PT Comments    Pt progressing with mobility. Tolerating short bouts of standing activity and ADL tasks; remains limited by generalized weakness, decreased activity tolerance and fatigue. Motivated to d/c home as soon as possible. Will continue to follow acutely.   Follow Up Recommendations  Home health PT;Supervision for mobility/OOB     Equipment Recommendations   (pt declined - will double check RW vs. rollator)    Recommendations for Other Services       Precautions / Restrictions Precautions Precautions: Fall;Other (comment) Precaution Comments: New pacemaker 09/2020 Restrictions Weight Bearing Restrictions: No    Mobility  Bed Mobility Overal bed mobility: Needs Assistance Bed Mobility: Sit to Supine       Sit to supine: Independent   General bed mobility comments: Indep to return to supine with bed flat, good awareness of lines  Transfers Overall transfer level: Needs assistance Equipment used: None Transfers: Sit to/from Stand Sit to Stand: Supervision         General transfer comment: Pt reaching to O2 tank for UE support, cued for safety to not do this; able to stand from recliner, low toilet and EOB without DME, supervision for safety; reliant on UE support  Ambulation/Gait Ambulation/Gait assistance: Min guard Gait Distance (Feet): 50 Feet Assistive device:  (O2 tank) Gait Pattern/deviations: Step-through pattern;Decreased stride length;Trunk flexed Gait velocity: decreased   General Gait Details: Slow, mostly steady, fatigued gait with min guard for  balance; pt declined RW use, but did want to push his O2 tank; 1x seated break to void on toilet; DOE 3/4 on 4L O2 [place]   Stairs             Wheelchair Mobility    Modified Rankin (Stroke Patients Only)       Balance Overall balance assessment: Needs assistance Sitting-balance support: No upper extremity supported;Feet supported Sitting balance-Leahy Scale: Good     Standing balance support: Single extremity supported;No upper extremity supported;During functional activity Standing balance-Leahy Scale: Fair                              Cognition Arousal/Alertness: Awake/alert Behavior During Therapy: WFL for tasks assessed/performed Overall Cognitive Status: No family/caregiver present to determine baseline cognitive functioning Area of Impairment: Safety/judgement                         Safety/Judgement: Decreased awareness of deficits     General Comments: WFL for majority of simple tasks, although seems to have decreased insight into current deficits and functional status (aka why he cannot just go home yet); not formally assessed      Exercises      General Comments        Pertinent Vitals/Pain Pain Assessment: Faces Faces Pain Scale: Hurts a little bit Pain Location: "Backside" from sitting in recliner Pain Descriptors / Indicators: Discomfort Pain Intervention(s): Repositioned    Home Living                      Prior Function  PT Goals (current goals can now be found in the care plan section) Progress towards PT goals: Progressing toward goals    Frequency    Min 3X/week      PT Plan Current plan remains appropriate    Co-evaluation              AM-PAC PT "6 Clicks" Mobility   Outcome Measure  Help needed turning from your back to your side while in a flat bed without using bedrails?: None Help needed moving from lying on your back to sitting on the side of a flat bed without using  bedrails?: None Help needed moving to and from a bed to a chair (including a wheelchair)?: A Little Help needed standing up from a chair using your arms (e.g., wheelchair or bedside chair)?: A Little Help needed to walk in hospital room?: A Little Help needed climbing 3-5 steps with a railing? : A Little 6 Click Score: 20    End of Session Equipment Utilized During Treatment: Oxygen Activity Tolerance: Patient tolerated treatment well;Patient limited by fatigue Patient left: in bed;with call bell/phone within reach;with bed alarm set Nurse Communication: Mobility status PT Visit Diagnosis: Muscle weakness (generalized) (M62.81);Other abnormalities of gait and mobility (R26.89)     Time: 6812-7517 PT Time Calculation (min) (ACUTE ONLY): 18 min  Charges:  $Therapeutic Exercise: 8-22 mins                     Mabeline Caras, PT, DPT Acute Rehabilitation Services  Pager [phone] Office [place] 11/03/2020, 4:43 PM

## 2020-11-03 NOTE — Progress Notes (Signed)
SATURATION QUALIFICATIONS: (This note is used to comply with regulatory documentation for home oxygen)  Patient Saturations on Room Air at Rest = 84%  Patient Saturations on Room Air while Ambulating = 70%  Patient Saturations on 8 Liters of oxygen while Ambulating = 88%  Please briefly explain why patient needs home oxygen:

## 2020-11-03 NOTE — Consult Note (Signed)
   [facility] CM Inpatient Consult   11/03/2020  RANE BLITCH 03/03/1936 [id number]   [place] [facility] [ACO] Patient: [employer]   Patient screened for less than 30 days readmission  Hospitalization and reviewed  to check if potential [facility] service needs.  Review of patient's medical record reveals patient is being recommended for home with Home Health.  Primary Care Provider is Caryl Bis Angela Adam, MD this provider is listed to provide the transition of care [TOC] for post hospital follow up.   Plan:  Continue to follow progress and disposition to assess for post hospital care management needs.    Please place a [facility] Care Management consult as appropriate and for questions contact:   Natividad Brood, RN BSN [facility] Liaison  [phone] business mobile phone Toll free office [phone]  Fax number: [phone] [email or web address]

## 2020-11-03 NOTE — Progress Notes (Signed)
PHARMACY - PHYSICIAN COMMUNICATION CRITICAL VALUE ALERT - BLOOD CULTURE IDENTIFICATION (BCID)  Zachary Santos is an 85 y.o. male who presented to [facility] on 10/30/2020 with a chief complaint of weakness/FTT  Assessment:   1/2 blood cultures growing Gram positive rods, likely contaminant.  The other blood culture also grew a contaminant and has resulted as Staph haemolyticus  Name of physician (or Provider) Contacted:  Dr. Chauncey Cruel Current antibiotics:  None - Remdesivir for COVID  Changes to prescribed antibiotics recommended:  None - likely contamination. .  Results for orders placed or performed during the hospital encounter of 10/30/20  Blood Culture ID Panel (Reflexed) (Collected: 10/31/2020  6:30 AM)  Result Value Ref Range   Enterococcus faecalis NOT DETECTED NOT DETECTED   Enterococcus Faecium NOT DETECTED NOT DETECTED   Listeria monocytogenes NOT DETECTED NOT DETECTED   Staphylococcus species DETECTED (A) NOT DETECTED   Staphylococcus aureus (BCID) NOT DETECTED NOT DETECTED   Staphylococcus epidermidis NOT DETECTED NOT DETECTED   Staphylococcus lugdunensis NOT DETECTED NOT DETECTED   Streptococcus species NOT DETECTED NOT DETECTED   Streptococcus agalactiae NOT DETECTED NOT DETECTED   Streptococcus pneumoniae NOT DETECTED NOT DETECTED   Streptococcus pyogenes NOT DETECTED NOT DETECTED   A.calcoaceticus-baumannii NOT DETECTED NOT DETECTED   Bacteroides fragilis NOT DETECTED NOT DETECTED   Enterobacterales NOT DETECTED NOT DETECTED   Enterobacter cloacae complex NOT DETECTED NOT DETECTED   Escherichia coli NOT DETECTED NOT DETECTED   Klebsiella aerogenes NOT DETECTED NOT DETECTED   Klebsiella oxytoca NOT DETECTED NOT DETECTED   Klebsiella pneumoniae NOT DETECTED NOT DETECTED   Proteus species NOT DETECTED NOT DETECTED   Salmonella species NOT DETECTED NOT DETECTED   Serratia marcescens NOT DETECTED NOT DETECTED   Haemophilus influenzae NOT DETECTED NOT DETECTED   Neisseria  meningitidis NOT DETECTED NOT DETECTED   Pseudomonas aeruginosa NOT DETECTED NOT DETECTED   Stenotrophomonas maltophilia NOT DETECTED NOT DETECTED   Candida albicans NOT DETECTED NOT DETECTED   Candida auris NOT DETECTED NOT DETECTED   Candida glabrata NOT DETECTED NOT DETECTED   Candida krusei NOT DETECTED NOT DETECTED   Candida parapsilosis NOT DETECTED NOT DETECTED   Candida tropicalis NOT DETECTED NOT DETECTED   Cryptococcus neoformans/gattii NOT DETECTED NOT DETECTED    Caryl Pina 11/03/2020  5:47 AM

## 2020-11-03 NOTE — Progress Notes (Signed)
PROGRESS NOTE  Zachary Santos  DOB: 1936/10/10  PCP: Leone Haven, MD JT:[id number]  DOA: 10/30/2020  LOS: 3 days   Chief Complaint  Patient presents with  . Weakness  . Failure To Thrive    Brief narrative: Zachary Santos is a 85 y.o. male with PMH significant for DM2, HTN, HLD, CKD 3A, A. fib, BPH and a recent diagnosis of third-degree heart block/sick sinus syndrome requiring pacemaker placement (09/2020). Patient presented to the ED on 1/13 with several days history of progressively worsening productive cough, shortness of breath, generalized weakness, poor appetite.   Unvaccinated for [place]. EMS found patient to have temperature 101.2, oxygen saturation low at 88% on room air, started on nasal cannula and brought to ED.  In the ED, patient was positive for COVID 19.   He required up to 4 L oxygen at home to maintain saturation more than 90%. Chest x-ray showed bilateral peripheral and subpleural streaky densities likely atypical infection. Patient was admitted to hospitalist service for further evaluation management.  Subjective: Patient was seen and examined this afternoon. Sitting up in chair.  Not in distress.  Breathing on 4 L, nasal cannula.  Ambulated on the hallway with RN earlier.  Required up to 8 L oxygen to maintain saturation over 90%.  Assessment/Plan: COVID pneumonia Acute respiratory failure with hypoxia  -Presented with worsening shortness of breath, cough, hypoxia -COVID test: PCR positive -Chest imaging: Bilateral peripheral and subpleural densities -Treatment: Currently on a 5-day course of IV remdesivir till 1/18, IV Solu-Medrol 40 mg twice daily.  CRP improving.  Oxygen requirement is 2 to 4 L at rest but still requires significant flow up to 8 L on ambulation.  We will give him 1 dose of Lasix IV 20 mg today. -Oxygen - SpO2: 95 % O2 Flow Rate (L/min): 2 L/min -Supportive care: Vitamin C, Zinc, PRN inhalers, Tylenol, Antitussives (benzonatate/  Mucinex/Tussionex).   -Encouraged incentive spirometry, prone position, out of bed and early mobilization as much as possible -Continue airborne/contact isolation precautions for duration of 3 weeks from the day of diagnosis. -WBC and inflammatory markers trend as below.  Continue to trend. Recent Labs  Lab 10/30/20 1839 10/31/20 0630 10/31/20 1510 11/01/20 0320 11/02/20 0304 11/03/20 0053  SARSCOV2NAA POSITIVE*  --   --   --   --   --   WBC 5.3 4.9  --  11.4* 13.8* 11.7*  PROCALCITON  --  0.15  --   --   --   --   DDIMER  --  2.30*  --  1.69* 1.33* 1.21*  FERRITIN  --  1,679* 1,331* 1,169* 923*  --   LDH  --  291*  --   --   --   --   CRP  --  11.3*  --  6.7* 4.0* 2.5*  ALT 154* 145*  --  121* 110* 90*   The treatment plan and use of medications and known side effects were discussed with patient/family. Some of the medications used are based on case reports/anecdotal data.  All other medications being used in the management of COVID-19 based on limited study data.  Complete risks and long-term side effects are unknown, however in the best clinical judgment they seem to be of some benefit.  Patient wanted to proceed with treatment options provided.  T2DM with hyperglycemia -A1c 8.6 on 10/07/2020. -Home meds include metformin 500 mg twice daily and Tradjenta 5 mg daily. -Continue both.  Currently blood sugar level is  running high because of steroids.  So patient is also on Lantus 10 units daily and sliding scale insulin with Accu-Cheks.  Continue same. Recent Labs  Lab 11/02/20 1711 11/02/20 1904 11/02/20 2110 11/03/20 0756 11/03/20 1154  GLUCAP 260* 239* 274* 169* 151*   Atrial fibrillation, chronic -Continue amiodarone and Eliquis. -Continue monitoring telemetry.  Essential hypertension -blood pressure is improving on lisinopril and amlodipine.  Continue to monitor  Hyperlipidemia -Continue statin  CKD 3 -Renal function at baseline. Recent Labs    05/27/20 1324  10/04/20 1627 10/06/20 1857 10/07/20 0515 10/08/20 0538 10/30/20 1839 10/31/20 0630 11/01/20 0320 11/02/20 0304 11/03/20 0053  BUN 17 15 19 20 21 23  24* 31* 34* 39*  CREATININE 1.41 1.34* 1.46* 1.49* 1.41* 1.33* 1.34* 1.27* 1.21 1.26*   COPD -Continue progressive trend steroids.  Current everyday smoker -Counseled to quit.  BPH -Continuing home regimen of Flomax  Mobility: Encourage ambulation.  PT recommended home health PT. Code Status:   Code Status: Full Code  Nutritional status: Body mass index is 22.13 kg/m. Nutrition Problem: Increased nutrient needs Etiology: acute illness (COVID-19) Signs/Symptoms: estimated needs Diet Order            Diet heart healthy/carb modified Room service appropriate? Yes; Fluid consistency: Thin  Diet effective now                 DVT prophylaxis:  apixaban (ELIQUIS) tablet 5 mg   Antimicrobials:  None Fluid: Not on IV fluid Consultants: None Family Communication:  None at bedside.  Patient says he has been keeping his family updated.  Per RN, daughter called earlier and stated that she is going to stay with him for few days after discharge.  Status is: Inpatient   Remains inpatient appropriate because: Ongoing treatment for COVID-19 pneumonia  Dispo: The patient is from: Home              Anticipated d/c is to: Home likely              Anticipated d/c date is: Hopefully tomorrow.              Patient currently is not medically stable to d/c.  Infusions:  . remdesivir 100 mg in NS 100 mL 100 mg (11/03/20 0820)    Scheduled Meds: . amiodarone  200 mg Oral Daily  . amLODipine  5 mg Oral Daily  . apixaban  5 mg Oral BID  . vitamin C  500 mg Oral Daily  . feeding supplement  237 mL Oral BID BM  . furosemide  20 mg Intravenous Once  . insulin aspart  0-15 Units Subcutaneous TID AC & HS  . insulin glargine  10 Units Subcutaneous Daily  . latanoprost  1 drop Both Eyes QHS  . linagliptin  5 mg Oral Daily  . lisinopril   20 mg Oral Daily  . mouth rinse  15 mL Mouth Rinse BID  . metFORMIN  500 mg Oral BID WC  . multivitamin with minerals  1 tablet Oral Daily  . predniSONE  50 mg Oral Daily  . rosuvastatin  20 mg Oral Daily  . tamsulosin  0.8 mg Oral Daily  . zinc sulfate  220 mg Oral Daily    Antimicrobials: Anti-infectives (From admission, onward)   Start     Dose/Rate Route Frequency Ordered Stop   11/01/20 1000  remdesivir 100 mg in sodium chloride 0.9 % 100 mL IVPB       "Followed by" Linked Group  Details   100 mg 200 mL/hr over 30 Minutes Intravenous Daily 10/31/20 0509 11/05/20 0959   10/31/20 0600  remdesivir 200 mg in sodium chloride 0.9% 250 mL IVPB       "Followed by" Linked Group Details   200 mg 580 mL/hr over 30 Minutes Intravenous Once 10/31/20 0509 10/31/20 0733   10/30/20 2315  azithromycin (ZITHROMAX) 500 mg in sodium chloride 0.9 % 250 mL IVPB        500 mg 250 mL/hr over 60 Minutes Intravenous  Once 10/30/20 2313 10/31/20 0112   10/30/20 2315  cefTRIAXone (ROCEPHIN) 1 g in sodium chloride 0.9 % 100 mL IVPB        1 g 200 mL/hr over 30 Minutes Intravenous  Once 10/30/20 2313 10/31/20 0051      PRN meds: acetaminophen, albuterol, guaiFENesin-dextromethorphan, ondansetron **OR** ondansetron (ZOFRAN) IV, polyethylene glycol   Objective: Vitals:   11/03/20 0400 11/03/20 0814  BP: (!) 128/52 (!) 163/75  Pulse: 60   Resp: 20   Temp: 98.6 F (37 C)   SpO2: 95%     Intake/Output Summary (Last 24 hours) at 11/03/2020 1400 Last data filed at 11/03/2020 0405 Gross per 24 hour  Intake 245.33 ml  Output 925 ml  Net -679.67 ml   Filed Weights   10/30/20 1755 11/01/20 0457 11/03/20 0400  Weight: 78.5 kg 73.5 kg 74 kg   Weight change:  Body mass index is 22.13 kg/m.   Physical Exam: General exam: Pleasant elderly Caucasian male.  Not in distress at rest. Skin: No rashes, lesions or ulcers. HEENT: Atraumatic, normocephalic, no obvious bleeding Lungs: Clear to auscultation  bilaterally.  No cough today CVS: Regular rate and rhythm, no murmur GI/Abd soft, nontender, nondistended, bowel sound present CNS: Alert, awake, oriented x3 Psychiatry: Frustrated because he requires significant amount of hours on ambulation. Extremities: No pedal edema, no calf tenderness  Data Review: I have personally reviewed the laboratory data and studies available.  Recent Labs  Lab 10/30/20 1839 10/31/20 0630 11/01/20 0320 11/02/20 0304 11/03/20 0053  WBC 5.3 4.9 11.4* 13.8* 11.7*  NEUTROABS 4.1 4.4 10.3* 12.6* 10.4*  HGB 12.1* 12.0* 11.5* 11.5* 10.9*  HCT 36.5* 38.0* 34.4* 34.2* 32.2*  MCV 91.0 92.2 91.0 90.2 90.4  PLT 234 236 265 286 251   Recent Labs  Lab 10/30/20 1839 10/31/20 0630 11/01/20 0320 11/02/20 0304 11/03/20 0053  NA 138 138 138 142 140  K 3.9 4.3 4.0 4.3 4.0  CL 103 103 104 106 105  CO2 25 21* 24 25 25   GLUCOSE 235* 337* 281* 164* 144*  BUN 23 24* 31* 34* 39*  CREATININE 1.33* 1.34* 1.27* 1.21 1.26*  CALCIUM 8.1* 8.4* 8.3* 8.7* 8.4*  MG  --   --  2.0 2.3 2.3    F/u labs ordered  Signed, Terrilee Croak, MD [facility] 11/03/2020

## 2020-11-03 NOTE — Discharge Instructions (Signed)

## 2020-11-04 DIAGNOSIS — J1282 Pneumonia due to coronavirus disease 2019: Secondary | ICD-10-CM

## 2020-11-04 DIAGNOSIS — U071 COVID-19: Principal | ICD-10-CM

## 2020-11-04 LAB — COMPREHENSIVE METABOLIC PANEL
ALT: 83 U/L — ABNORMAL HIGH (ref 0–44)
AST: 44 U/L — ABNORMAL HIGH (ref 15–41)
Albumin: 2.5 g/dL — ABNORMAL LOW (ref 3.5–5.0)
Alkaline Phosphatase: 100 U/L (ref 38–126)
Anion gap: 11 (ref 5–15)
BUN: 42 mg/dL — ABNORMAL HIGH (ref 8–23)
CO2: 24 mmol/L (ref 22–32)
Calcium: 7.9 mg/dL — ABNORMAL LOW (ref 8.9–10.3)
Chloride: 104 mmol/L (ref 98–111)
Creatinine, Ser: 1.44 mg/dL — ABNORMAL HIGH (ref 0.61–1.24)
GFR, Estimated: 49 mL/min — ABNORMAL LOW (ref >60)
Glucose, Bld: 196 mg/dL — ABNORMAL HIGH (ref 70–99)
Potassium: 4.2 mmol/L (ref 3.5–5.1)
Sodium: 139 mmol/L (ref 135–145)
Total Bilirubin: 1 mg/dL (ref 0.3–1.2)
Total Protein: 5.4 g/dL — ABNORMAL LOW (ref 6.5–8.1)

## 2020-11-04 LAB — CBC WITH DIFFERENTIAL/PLATELET
Abs Immature Granulocytes: 0.16 10*3/uL — ABNORMAL HIGH (ref 0.00–0.07)
Basophils Absolute: 0 10*3/uL (ref 0.0–0.1)
Basophils Relative: 0 %
Eosinophils Absolute: 0 10*3/uL (ref 0.0–0.5)
Eosinophils Relative: 0 %
HCT: 34.4 % — ABNORMAL LOW (ref 39.0–52.0)
Hemoglobin: 11 g/dL — ABNORMAL LOW (ref 13.0–17.0)
Immature Granulocytes: 1 %
Lymphocytes Relative: 5 %
Lymphs Abs: 0.6 10*3/uL — ABNORMAL LOW (ref 0.7–4.0)
MCH: 30 pg (ref 26.0–34.0)
MCHC: 32 g/dL (ref 30.0–36.0)
MCV: 93.7 fL (ref 80.0–100.0)
Monocytes Absolute: 0.6 10*3/uL (ref 0.1–1.0)
Monocytes Relative: 5 %
Neutro Abs: 10.4 10*3/uL — ABNORMAL HIGH (ref 1.7–7.7)
Neutrophils Relative %: 89 %
Platelets: 194 10*3/uL (ref 150–400)
RBC: 3.67 MIL/uL — ABNORMAL LOW (ref 4.22–5.81)
RDW: 13.2 % (ref 11.5–15.5)
WBC: 11.8 10*3/uL — ABNORMAL HIGH (ref 4.0–10.5)
nRBC: 0 % (ref 0.0–0.2)

## 2020-11-04 LAB — C-REACTIVE PROTEIN: CRP: 1.6 mg/dL — ABNORMAL HIGH (ref <1.0)

## 2020-11-04 LAB — GLUCOSE, CAPILLARY
Glucose-Capillary: 201 mg/dL — ABNORMAL HIGH (ref 70–99)
Glucose-Capillary: 177 mg/dL — ABNORMAL HIGH (ref 70–99)

## 2020-11-04 LAB — MAGNESIUM: Magnesium: 2.2 mg/dL (ref 1.7–2.4)

## 2020-11-04 LAB — D-DIMER, QUANTITATIVE: D-Dimer, Quant: 1.16 ug/mL-FEU — ABNORMAL HIGH (ref 0.00–0.50)

## 2020-11-04 MED ORDER — INSULIN GLARGINE 100 UNIT/ML ~~LOC~~ SOLN: 13.0000 [IU] | Freq: Every day | SUBCUTANEOUS | Status: DC

## 2020-11-04 MED ORDER — AMLODIPINE BESYLATE 5 MG PO TABS
5.0000 mg | ORAL_TABLET | Freq: Every day | ORAL | 2 refills | Status: DC
Start: 2020-11-05 — End: 2021-03-25

## 2020-11-04 MED ORDER — PREDNISONE 50 MG PO TABS: 50.0000 mg | ORAL_TABLET | Freq: Every day | ORAL | 0 refills | Status: AC

## 2020-11-04 MED ORDER — GUAIFENESIN-DM 100-10 MG/5ML PO SYRP: 10.0000 mL | ORAL_SOLUTION | ORAL | 0 refills | Status: DC | PRN

## 2020-11-04 MED ORDER — GLIPIZIDE 5 MG PO TABS: 2.5000 mg | ORAL_TABLET | Freq: Two times a day (BID) | ORAL | 2 refills | Status: DC

## 2020-11-04 NOTE — Discharge Summary (Signed)
Physician Discharge Summary  Zachary Santos:[id number] DOB: 20-Feb-1936 DOA: 10/30/2020  PCP: Leone Haven, MD  Admit date: 10/30/2020 Discharge date: 11/04/2020  Admitted From: Home Discharge disposition: Home   Code Status: Full Code  Diet Recommendation: Cardiac/diabetic diet  Discharge Diagnosis:   Principal Problem:   Pneumonia due to COVID-19 virus Active Problems:   Acute respiratory failure with hypoxia ([place])   Essential hypertension, benign   COPD (chronic obstructive pulmonary disease) ([place])   BPH (benign prostatic hyperplasia)   Type 2 diabetes mellitus with hyperglycemia, without long-term current use of insulin (HCC)   Chronic kidney disease, stage 3a (HCC)   Atrial fibrillation, chronic (HCC)   Mixed diabetic hyperlipidemia associated with type 2 diabetes mellitus ([place])   Nicotine dependence, cigarettes, uncomplicated   Chief Complaint  Patient presents with  . Weakness  . Failure To Thrive    Brief narrative: Zachary Santos is a 85 y.o. male with PMH significant for DM2, HTN, HLD, CKD 3A, A. fib, BPH and a recent diagnosis of third-degree heart block/sick sinus syndrome requiring pacemaker placement (09/2020). Patient presented to the ED on 1/13 with several days history of progressively worsening productive cough, shortness of breath, generalized weakness, poor appetite.   Unvaccinated for [place]. EMS found patient to have temperature 101.2, oxygen saturation low at 88% on room air, started on nasal cannula and brought to ED.  In the ED, patient was positive for COVID 19.   He required up to 4 L oxygen at home to maintain saturation more than 90%. Chest x-ray showed bilateral peripheral and subpleural streaky densities likely atypical infection. Patient was admitted to hospitalist service for further evaluation management.  Subjective: Patient was seen and examined this morning.  Sitting up in bed.  Not in distress.   . On 2 L oxygen by oxygen nasal  cannula.  Able to ambulate in the hallway on 4 L to maintain saturation over 88%.    Hospital course: COVID pneumonia Acute respiratory failure with hypoxia  -Presented with worsening shortness of breath, cough, hypoxia -COVID test: PCR positive -Chest imaging: Bilateral peripheral and subpleural densities -Treatment: He completed 5-day course of IV remdesivir today 1/18.  He has also remained on Solu-Medrol IV 40 mg twice daily.  We will discharge him home on prednisone for next 5 days.  CRP improving.  Oxygen requirement improving. -Supportive care: Vitamin C, Zinc, PRN inhalers, Tylenol, Antitussives (benzonatate/ Mucinex/Tussionex).   -Continue airborne/contact isolation precautions for duration of 3 weeks from the day of diagnosis. -WBC and inflammatory markers trend as below.  Continue to trend. Recent Labs  Lab 10/30/20 1839 10/31/20 0630 10/31/20 1510 11/01/20 0320 11/02/20 0304 11/03/20 0053 11/04/20 0033  SARSCOV2NAA POSITIVE*  --   --   --   --   --   --   WBC 5.3 4.9  --  11.4* 13.8* 11.7* 11.8*  PROCALCITON  --  0.15  --   --   --   --   --   DDIMER  --  2.30*  --  1.69* 1.33* 1.21* 1.16*  FERRITIN  --  1,679* 1,331* 1,169* 923*  --   --   LDH  --  291*  --   --   --   --   --   CRP  --  11.3*  --  6.7* 4.0* 2.5* 1.6*  ALT 154* 145*  --  121* 110* 90* 83*   The treatment plan and use of medications and known  side effects were discussed with patient/family. Some of the medications used are based on case reports/anecdotal data.  All other medications being used in the management of COVID-19 based on limited study data.  Complete risks and long-term side effects are unknown, however in the best clinical judgment they seem to be of some benefit.  Patient wanted to proceed with treatment options provided.  T2DM with hyperglycemia -A1c 8.6 on 10/07/2020. -Home meds include metformin 500 mg twice daily and Tradjenta 5 mg daily. -Currently continued on both of them.  Currently  is also receiving Lantus 10 units daily because of hyperglycemia related to steroids.  His blood sugar level will improve once steroid dose is reduced to stop.  But because of his elevated A1c at 8.6, I believe he would benefit from long-term insulin.  Patient is reluctant to use long-term insulin.  For now, I will add glipizide 2.30m BID to his regimen and have him follow-up with PCP as an outpatient.   Recent Labs  Lab 11/03/20 0756 11/03/20 1154 11/03/20 1743 11/03/20 2039 11/04/20 0740  GLUCAP 169* 151* 186* 190* 201*   Atrial fibrillation, chronic. -Continue amiodarone and Eliquis. -Continue monitoring telemetry.  Essential hypertension -blood pressure is improving on lisinopril and amlodipine.  Hyperlipidemia -Continue statin  CKD 3 -Renal function at baseline. Recent Labs    10/04/20 1627 10/06/20 1857 10/07/20 0515 10/08/20 0538 10/30/20 1839 10/31/20 0630 11/01/20 0320 11/02/20 0304 11/03/20 0053 11/04/20 0033  BUN '15 19 20 21 23 ' 24* 31* 34* 39* 42*  CREATININE 1.34* 1.46* 1.49* 1.41* 1.33* 1.34* 1.27* 1.21 1.26* 1.44*   COPD -Continue progressive trend steroids.  Current everyday smoker -Counseled to quit.  BPH -Continue home regimen of Flomax   Wound care:    Discharge Exam:   Vitals:   11/03/20 0400 11/03/20 0814 11/03/20 2035 11/04/20 0434  BP: (!) 128/52 (!) 163/75 128/64 134/65  Pulse: 60  60 60  Resp: '20  18 20  ' Temp: 98.6 F (37 C)  (!) 97.4 F (36.3 C) (!) 97.2 F (36.2 C)  TempSrc: Oral  Axillary Axillary  SpO2: 95%  94% 90%  Weight: 74 kg     Height:        Body mass index is 22.13 kg/m.  General exam: Pleasant, elderly Caucasian male.  Not in distress. Skin: No rashes, lesions or ulcers. HEENT: Atraumatic, normocephalic, no obvious bleeding Lungs: Clear to auscultation bilaterally CVS: Regular rate and rhythm, no murmur GI/Abd soft, nontender, nondistended, bowel sound present CNS: Alert, awake, oriented x3 Psychiatry:  Mood appropriate Extremities: No pedal edema, no calf tenderness  Follow ups:   Discharge Instructions    Increase activity slowly   Complete by: As directed       Follow-up Information    SLeone Haven MD Follow up.   Specialty: Family Medicine Contact information: [id number] [phone]       AKate Sable MD .   Specialties: Cardiology, Radiology Contact information: [phone]       LVickie Epley MD .   Specialties: Cardiology, Radiology Contact information: [phone]              Recommendations for Outpatient Follow-Up:   1. Follow-up with PCP as an outpatient  Discharge Instructions:  Follow with Primary MD SLeone Haven MD in 7 days   Get CBC/BMP checked in  next visit within 1 week by PCP or SNF MD ( we routinely change or add medications that can affect your baseline labs and fluid status, therefore we recommend that you get the mentioned basic workup next visit with your PCP, your PCP may decide not to get them or add new tests based on their clinical decision)  On your next visit with your PCP, please Get Medicines reviewed and adjusted.  Please request your PCP  to go over all Hospital Tests and Procedure/Radiological results at the follow up, please get all Hospital records sent to your Prim MD by signing hospital release before you go home.  Activity: As tolerated with Full fall precautions use walker/cane & assistance as needed  For Heart failure patients - Check your Weight same time everyday, if you gain over 2 pounds, or you develop in leg swelling, experience more shortness of breath or chest pain, call your Primary MD immediately. Follow Cardiac Low Salt Diet and 1.5 lit/day fluid restriction.  If you have smoked or chewed Tobacco in the last 2 yrs please stop smoking, stop any  regular Alcohol  and or any Recreational drug use.  If you experience worsening of your admission symptoms, develop shortness of breath, life threatening emergency, suicidal or homicidal thoughts you must seek medical attention immediately by calling 911 or calling your MD immediately  if symptoms less severe.  You Must read complete instructions/literature along with all the possible adverse reactions/side effects for all the Medicines you take and that have been prescribed to you. Take any new Medicines after you have completely understood and accpet all the possible adverse reactions/side effects.   Do not drive, operate heavy machinery, perform activities at heights, swimming or participation in water activities or provide baby sitting services if your were admitted for syncope or siezures until you have seen by Primary MD or a Neurologist and advised to do so again.  Do not drive when taking Pain medications.  Do not take more than prescribed Pain, Sleep and Anxiety Medications  Wear Seat belts while driving.   Please note You were cared for by a hospitalist during your hospital stay. If you have any questions about your discharge medications or the care you received while you were in the hospital after you are discharged, you can call the unit and asked to speak with the hospitalist on call if the hospitalist that took care of you is not available. Once you are discharged, your primary care physician will handle any further medical issues. Please note that NO REFILLS for any discharge medications will be authorized once you are discharged, as it is imperative that you return to your primary care physician (or establish a relationship with a primary care physician if you do not have one) for your aftercare needs so that they can reassess your need for medications and monitor your lab values.    Allergies as of 11/04/2020      Reactions   Other Rash   After getting pacemaker. The medication  applied to skin caused a bad rash       Medication List    TAKE these medications   amiodarone 200 MG tablet Commonly known as: PACERONE Take 1 tablet by mouth two times a day for one week, then take 1 tablet by mouth once daily. What changed:   how much to take  how to take this  when to take this  additional instructions   amLODipine 5 MG tablet Commonly known as: NORVASC  Take 1 tablet (5 mg total) by mouth daily. Start taking on: November 05, 2020   apixaban 5 MG Tabs tablet Commonly known as: ELIQUIS Take 1 tablet (5 mg total) by mouth 2 (two) times daily.   blood glucose meter kit and supplies Kit Dispense based on patient and insurance preference. Use up to four times daily as directed. (FOR ICD-9 250.00, 250.01).   glipiZIDE 5 MG tablet Commonly known as: GLUCOTROL Take 0.5 tablets (2.5 mg total) by mouth 2 (two) times daily.   guaiFENesin-dextromethorphan 100-10 MG/5ML syrup Commonly known as: ROBITUSSIN DM Take 10 mLs by mouth every 4 (four) hours as needed for cough.   linagliptin 5 MG Tabs tablet Commonly known as: TRADJENTA Take 5 mg by mouth daily.   lisinopril 20 MG tablet Commonly known as: ZESTRIL Take 1 tablet (20 mg total) by mouth daily.   metFORMIN 500 MG 24 hr tablet Commonly known as: Glucophage XR Take 1 tablet (500 mg total) by mouth in the morning and at bedtime.   ONE TOUCH ULTRA TEST test strip Generic drug: glucose blood USE TEST STRIP TO CHECK GLUCOSE AT LEAST ONCE DAILY   predniSONE 50 MG tablet Commonly known as: DELTASONE Take 1 tablet (50 mg total) by mouth daily for 5 days. Start taking on: November 05, 2020   rosuvastatin 20 MG tablet Commonly known as: CRESTOR Take 1 tablet by mouth once daily   tamsulosin 0.4 MG Caps capsule Commonly known as: FLOMAX Take 2 capsules by mouth once daily What changed:   how much to take  when to take this   triamcinolone 0.1 % Commonly known as: KENALOG Apply 1 application  topically 2 (two) times daily.   vitamin B-12 500 MCG tablet Commonly known as: CYANOCOBALAMIN Take 500 mcg by mouth daily.            Durable Medical Equipment  (From admission, onward)         Start     Ordered   11/04/20 1046  DME Oxygen  Once       Question Answer Comment  Length of Need Lifetime   Mode or (Route) Nasal cannula   Liters per Minute 4   Frequency Continuous (stationary and portable oxygen unit needed)   Oxygen conserving device Yes   Oxygen delivery system Gas      11/04/20 1045          Time coordinating discharge: 35 minutes  The results of significant diagnostics from this hospitalization (including imaging, microbiology, ancillary and laboratory) are listed below for reference.    Procedures and Diagnostic Studies:   DG Chest Port 1 View  Result Date: 10/30/2020 CLINICAL DATA:  85 year old male with cough EXAM: PORTABLE CHEST 1 VIEW COMPARISON:  Chest radiograph dated 10/08/2020. FINDINGS: Bilateral peripheral and subpleural streaky densities, likely sequela of atypical infection including COVID-19. Clinical correlation is recommended. No pleural effusion pneumothorax. Mild cardiomegaly. Left pectoral pacemaker device. No acute osseous pathology. IMPRESSION: Bilateral peripheral and subpleural streaky densities, likely sequela of atypical infection. Clinical correlation is recommended. Electronically Signed   By: Anner Crete M.D.   On: 10/30/2020 19:18     Labs:   Basic Metabolic Panel: Recent Labs  Lab 10/31/20 0630 11/01/20 0320 11/02/20 0304 11/03/20 0053 11/04/20 0033  NA 138 138 142 140 139  K 4.3 4.0 4.3 4.0 4.2  CL 103 104 106 105 104  CO2 21* '24 25 25 24  ' GLUCOSE 337* 281* 164* 144* 196*  BUN 24* 31*  34* 39* 42*  CREATININE 1.34* 1.27* 1.21 1.26* 1.44*  CALCIUM 8.4* 8.3* 8.7* 8.4* 7.9*  MG  --  2.0 2.3 2.3 2.2   GFR Estimated Creatinine Clearance: 41.4 mL/min (A) (by C-G formula based on SCr of 1.44 mg/dL  (H)). Liver Function Tests: Recent Labs  Lab 10/31/20 0630 11/01/20 0320 11/02/20 0304 11/03/20 0053 11/04/20 0033  AST 117* 71* 53* 38 44*  ALT 145* 121* 110* 90* 83*  ALKPHOS 104 108 92 101 100  BILITOT 0.9 0.7 0.8 0.8 1.0  PROT 5.7* 5.4* 5.8* 5.5* 5.4*  ALBUMIN 2.4* 2.4* 2.5* 2.5* 2.5*   No results for input(s): LIPASE, AMYLASE in the last 168 hours. No results for input(s): AMMONIA in the last 168 hours. Coagulation profile No results for input(s): INR, PROTIME in the last 168 hours.  CBC: Recent Labs  Lab 10/31/20 0630 11/01/20 0320 11/02/20 0304 11/03/20 0053 11/04/20 0033  WBC 4.9 11.4* 13.8* 11.7* 11.8*  NEUTROABS 4.4 10.3* 12.6* 10.4* 10.4*  HGB 12.0* 11.5* 11.5* 10.9* 11.0*  HCT 38.0* 34.4* 34.2* 32.2* 34.4*  MCV 92.2 91.0 90.2 90.4 93.7  PLT 236 265 286 251 194   Cardiac Enzymes: No results for input(s): CKTOTAL, CKMB, CKMBINDEX, TROPONINI in the last 168 hours. BNP: Invalid input(s): POCBNP CBG: Recent Labs  Lab 11/03/20 0756 11/03/20 1154 11/03/20 1743 11/03/20 2039 11/04/20 0740  GLUCAP 169* 151* 186* 190* 201*   D-Dimer Recent Labs    11/03/20 0053 11/04/20 0033  DDIMER 1.21* 1.16*   Hgb A1c No results for input(s): HGBA1C in the last 72 hours. Lipid Profile No results for input(s): CHOL, HDL, LDLCALC, TRIG, CHOLHDL, LDLDIRECT in the last 72 hours. Thyroid function studies No results for input(s): TSH, T4TOTAL, T3FREE, THYROIDAB in the last 72 hours.  Invalid input(s): FREET3 Anemia work up Recent Labs    11/02/20 0304  FERRITIN 923*   Microbiology Recent Results (from the past 240 hour(s))  SARS CORONAVIRUS 2 (TAT 6-24 HRS) Nasopharyngeal Nasopharyngeal Swab     Status: Abnormal   Collection Time: 10/30/20  6:39 PM   Specimen: Nasopharyngeal Swab  Result Value Ref Range Status   SARS Coronavirus 2 POSITIVE (A) NEGATIVE Final    Comment: (NOTE) SARS-CoV-2 target nucleic acids are DETECTED.  The SARS-CoV-2 RNA is generally  detectable in upper and lower respiratory specimens during the acute phase of infection. Positive results are indicative of the presence of SARS-CoV-2 RNA. Clinical correlation with patient history and other diagnostic information is  necessary to determine patient infection status. Positive results do not rule out bacterial infection or co-infection with other viruses.  The expected result is Negative.  Fact Sheet for Patients: SugarRoll.be  Fact Sheet for Healthcare Providers: https://www.woods-mathews.com/  This test is not yet approved or cleared by the [place] FDA and  has been authorized for detection and/or diagnosis of SARS-CoV-2 by FDA under an Emergency Use Authorization (EUA). This EUA will remain  in effect (meaning this test can be used) for the duration of the COVID-19 declaration under Section 564(b)(1) of the Act, 21 U. S.C. section 360bbb-3(b)(1), unless the authorization is terminated or revoked sooner.   Performed at [facility], [place] [address] [place], [place] [address]   Culture, blood (Routine X 2) w Reflex to ID Panel     Status: None (Preliminary result)   Collection Time: 10/31/20  6:30 AM   Specimen: BLOOD LEFT FOREARM  Result Value Ref Range Status   Specimen Description BLOOD LEFT FOREARM  Final   Special Requests   Final    BOTTLES DRAWN AEROBIC AND ANAEROBIC Blood Culture adequate volume   Culture  Setup Time   Final    GRAM POSITIVE RODS AEROBIC BOTTLE ONLY CRITICAL RESULT CALLED TO, READ BACK BY AND VERIFIED WITH: Salli Real 5102 11/03/2020 Mena Goes Performed at [facility], [place] [address] [place], [place] [address]    Culture GRAM POSITIVE RODS  Final   Report Status PENDING  Incomplete  Culture, blood (Routine X 2) w Reflex to ID Panel     Status: Abnormal   Collection Time: 10/31/20  6:30 AM   Specimen: BLOOD RIGHT HAND  Result Value Ref Range Status   Specimen  Description BLOOD RIGHT HAND  Final   Special Requests   Final    BOTTLES DRAWN AEROBIC AND ANAEROBIC Blood Culture adequate volume   Culture  Setup Time   Final    GRAM POSITIVE COCCI AEROBIC BOTTLE ONLY CRITICAL RESULT CALLED TO, READ BACK BY AND VERIFIED WITH: PHARM D E.MARTIN AT 7824 ON 11/01/2020 BY T.SAAD    Culture (A)  Final    STAPHYLOCOCCUS HAEMOLYTICUS THE SIGNIFICANCE OF ISOLATING THIS ORGANISM FROM A SINGLE SET OF BLOOD CULTURES WHEN MULTIPLE SETS ARE DRAWN IS UNCERTAIN. PLEASE NOTIFY THE MICROBIOLOGY DEPARTMENT WITHIN ONE WEEK IF SPECIATION AND SENSITIVITIES ARE REQUIRED. Performed at [facility], [place] [address] [place], [place] [address]    Report Status 11/02/2020 FINAL  Final  Blood Culture ID Panel (Reflexed)     Status: Abnormal   Collection Time: 10/31/20  6:30 AM  Result Value Ref Range Status   Enterococcus faecalis NOT DETECTED NOT DETECTED Final   Enterococcus Faecium NOT DETECTED NOT DETECTED Final   Listeria monocytogenes NOT DETECTED NOT DETECTED Final   Staphylococcus species DETECTED (A) NOT DETECTED Final    Comment: CRITICAL RESULT CALLED TO, READ BACK BY AND VERIFIED WITH: PHARMD E.MARTIN AT 1048 ON 11/01/2020 BY T.SAAD.    Staphylococcus aureus (BCID) NOT DETECTED NOT DETECTED Final   Staphylococcus epidermidis NOT DETECTED NOT DETECTED Final   Staphylococcus lugdunensis NOT DETECTED NOT DETECTED Final   Streptococcus species NOT DETECTED NOT DETECTED Final   Streptococcus agalactiae NOT DETECTED NOT DETECTED Final   Streptococcus pneumoniae NOT DETECTED NOT DETECTED Final   Streptococcus pyogenes NOT DETECTED NOT DETECTED Final   A.calcoaceticus-baumannii NOT DETECTED NOT DETECTED Final   Bacteroides fragilis NOT DETECTED NOT DETECTED Final   Enterobacterales NOT DETECTED NOT DETECTED Final   Enterobacter cloacae complex NOT DETECTED NOT DETECTED Final   Escherichia coli NOT DETECTED NOT DETECTED Final   Klebsiella aerogenes NOT DETECTED  NOT DETECTED Final   Klebsiella oxytoca NOT DETECTED NOT DETECTED Final   Klebsiella pneumoniae NOT DETECTED NOT DETECTED Final   Proteus species NOT DETECTED NOT DETECTED Final   Salmonella species NOT DETECTED NOT DETECTED Final   Serratia marcescens NOT DETECTED NOT DETECTED Final   Haemophilus influenzae NOT DETECTED NOT DETECTED Final   Neisseria meningitidis NOT DETECTED NOT DETECTED Final   Pseudomonas aeruginosa NOT DETECTED NOT DETECTED Final   Stenotrophomonas maltophilia NOT DETECTED NOT DETECTED Final   Candida albicans NOT DETECTED NOT DETECTED Final   Candida auris NOT DETECTED NOT DETECTED Final   Candida glabrata NOT DETECTED NOT DETECTED Final   Candida krusei NOT DETECTED NOT DETECTED Final   Candida parapsilosis NOT DETECTED NOT DETECTED Final   Candida tropicalis NOT DETECTED NOT DETECTED Final   Cryptococcus neoformans/gattii NOT DETECTED NOT DETECTED  Final    Comment: Performed at [facility], [place] [address], [place], [place] [address]     Signed: Terrilee Croak  [facility] 11/04/2020, 10:45 AM

## 2020-11-04 NOTE — TOC Initial Note (Signed)
Transition of Care [facility]) - Initial/Assessment Note    Patient Details  Name: Zachary Santos MRN: [id number] Date of Birth: 04/15/1936  Transition of Care [facility]) CM/SW Contact:    Bethena Roys, RN Phone Number: 11/04/2020, 2:23 PM  Clinical Narrative: Patient presented for weakness. Plan to return home with support of his wife. Patient states he has a rolling walker with seat in the home. Patient in need of durable medical equipment oxygen and the patient is agreeable with [facility]. Patient is agreeable with [facility] for physical therapy and occupational therapy. Office to call the patient for visit times. Patient states he will have transportation home. No further needs from Case Manager at this time.                   Expected Discharge Plan: [place] Barriers to Discharge: No Barriers Identified   Patient Goals and CMS Choice   CMS Medicare.gov Compare Post Acute Care list provided to::  (Patient did not have a preference.) Choice offered to / list presented to : Patient  Expected Discharge Plan and Services Expected Discharge Plan: [place] In-house Referral: NA Discharge Planning Services: CM Consult Post Acute Care Choice: [place] arrangements for the past 2 months: Single Family Home Expected Discharge Date: 11/04/20                 DME Agency: [facility] Date DME Agency Contacted: 11/04/20 Time DME Agency Contacted: [id number] Representative spoke with at DME Agency: Freda Munro HH Arranged: OT,PT [place] Date [place]: 11/04/20 Time HH Agency Contacted: 1200 Representative spoke with at [place]: Amy  Prior Living Arrangements/Services Living arrangements for the past 2 months: [place] with:: Spouse Patient language and need for interpreter reviewed:: Yes Do you feel safe going back to the place where you live?: Yes      Need for Family Participation in Patient  Care: Yes (Comment) Care giver support system in place?: Yes (comment)   Criminal Activity/Legal Involvement Pertinent to Current Situation/Hospitalization: No - Comment as needed  Activities of Daily Living Home Assistive Devices/Equipment: Blood pressure cuff,Cane (specify quad or straight),CBG Meter,Eyeglasses,Grab bars around toilet,Grab bars in shower,Walker (specify type) (Four wheeled walker.) ADL Screening (condition at time of admission) Patient's cognitive ability adequate to safely complete daily activities?: Yes Is the patient deaf or have difficulty hearing?: No Does the patient have difficulty seeing, even when wearing glasses/contacts?: No Does the patient have difficulty concentrating, remembering, or making decisions?: No Patient able to express need for assistance with ADLs?: Yes Does the patient have difficulty dressing or bathing?: No Independently performs ADLs?: Yes (appropriate for developmental age) Does the patient have difficulty walking or climbing stairs?: Yes Weakness of Legs: Both Weakness of Arms/Hands: None  Permission Sought/Granted Permission sought to share information with : Case Manager,Facility Contact Representative,Family Supports Permission granted to share information with : Yes, Verbal Permission Granted     Permission granted to share info w AGENCY: [facility] and [facility]        Emotional Assessment Appearance:: Appears stated age Attitude/Demeanor/Rapport: Engaged Affect (typically observed): Appropriate Orientation: : Oriented to  Time,Oriented to Place,Oriented to Self,Oriented to Situation Alcohol / Substance Use: Not Applicable    Admission diagnosis:  Hypoxia [R09.02] Acute respiratory failure with hypoxia ([place]) [J96.01] Fever, unspecified fever cause [R50.9] COVID-19 virus infection [U07.1] Patient Active Problem List   Diagnosis Date Noted  . Pneumonia due to COVID-19  virus 10/31/2020  . Mixed diabetic hyperlipidemia  associated with type 2 diabetes mellitus ([place]) 10/31/2020  . Nicotine dependence, cigarettes, uncomplicated 53/74/8270  . Acute respiratory failure with hypoxia ([place]) 10/30/2020  . Rash 10/20/2020  . Bilateral leg weakness 10/20/2020  . Cardiac syncope 10/07/2020  . Complete heart block ([place])   . Musculoskeletal chest pain 08/18/2020  . Atherosclerosis 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 ([place]) 06/06/2020  . Atrial fibrillation, chronic ([place]) 05/27/2020  . Blue toes 05/27/2020  . Insomnia 11/23/2019  . Anxiety and depression 07/17/2019  . Erectile dysfunction 08/29/2018  . Elevated alkaline phosphatase level 08/02/2017  . Fatigue 05/02/2017  . Dyslipidemia 11/01/2016  . Right leg claudication ([place]) 06/20/2015  . Chronic kidney disease, stage 3a ([place])   . Type 2 diabetes mellitus with hyperglycemia, without long-term current use of insulin ([place]) 09/12/2013  . Osteoarthritis of right knee 06/07/2013  . BPH (benign prostatic hyperplasia) 02/02/2010  . Essential hypertension, benign 02/05/2008  . COPD (chronic obstructive pulmonary disease) ([place]) 02/05/2008  . Paget's disease of bone 02/05/2008   PCP:  Leone Haven, MD Pharmacy:   [facility] [address] [place]) [place] [address] Phone: [phone] Fax: [phone]  Readmission Risk Interventions No flowsheet data found.

## 2020-11-04 NOTE — Evaluation (Signed)
Occupational Therapy Evaluation Patient Details Name: Zachary Santos MRN: [id number] DOB: 1936-04-13 Today's Date: 11/04/2020    History of Present Illness Pt is an 85 y.o. male admitted 10/30/20 with cough, SOB, weakness and poor appetite. Workup for acute hypoxic respiratory failure due to COVID-19 PNA. PMH includes DM2, HTN, CKD 3, afib, 3rd degree heart block/sick sinus syndrome s/p pacemaker (09/2020).   Clinical Impression   This 85 y/o male presents with the above. PTA pt reports being independent with ADL and functional mobility. Today pt performing mobility tasks in room and short distance into hallway (pushing dynamap) overall with minguard-minA. He is currently performing ADL at a minA level. Pt overall requiring 4L O2 to maintain SpO2 >/=87%. He reports his daughter can assist PRN after return home. Pt to benefit from continued acute OT services and currently recommend follow up [facility] services after discharge to maximize his overall safety and independence with ADL and mobility.     Follow Up Recommendations  Home health OT;Supervision/Assistance - 24 hour    Equipment Recommendations  Other (comment) (rollator, if pt agreeable)           Precautions / Restrictions Precautions Precautions: Fall;Other (comment) Precaution Comments: New pacemaker 09/2020 Restrictions Weight Bearing Restrictions: No      Mobility Bed Mobility Overal bed mobility: Modified Independent                  Transfers Overall transfer level: Needs assistance Equipment used: None Transfers: Sit to/from Stand Sit to Stand: Supervision         General transfer comment: for lines and safety    Balance Overall balance assessment: Needs assistance Sitting-balance support: No upper extremity supported;Feet supported Sitting balance-Leahy Scale: Good     Standing balance support: Single extremity supported;No upper extremity supported;During functional activity Standing balance-Leahy  Scale: Fair Standing balance comment: preference for UE support with mobility                           ADL either performed or assessed with clinical judgement   ADL Overall ADL's : Needs assistance/impaired Eating/Feeding: Modified independent;Sitting   Grooming: Set up;Sitting   Upper Body Bathing: Set up;Supervision/ safety;Sitting   Lower Body Bathing: Minimal assistance;Sit to/from stand   Upper Body Dressing : Set up;Sitting   Lower Body Dressing: Minimal assistance;Sit to/from stand   Toilet Transfer: Min guard;Ambulation   Toileting- Clothing Manipulation and Hygiene: Minimal assistance;Sit to/from stand       Functional mobility during ADLs: Min guard;Minimal assistance (pushing dynamap)                           Pertinent Vitals/Pain Pain Assessment: Faces Faces Pain Scale: No hurt Pain Intervention(s): Monitored during session     Hand Dominance     Extremity/Trunk Assessment Upper Extremity Assessment Upper Extremity Assessment: Generalized weakness   Lower Extremity Assessment Lower Extremity Assessment: Defer to PT evaluation;Generalized weakness   Cervical / Trunk Assessment Cervical / Trunk Assessment: Kyphotic   Communication Communication Communication: No difficulties   Cognition Arousal/Alertness: Awake/alert Behavior During Therapy: WFL for tasks assessed/performed;Flat affect Overall Cognitive Status: No family/caregiver present to determine baseline cognitive functioning Area of Impairment: Safety/judgement                         Safety/Judgement: Decreased awareness of deficits         General Comments  Exercises     Shoulder Instructions      Home Living Family/patient expects to be discharged to:: Private residence Living Arrangements: Spouse/significant other Available Help at Discharge: Family;Available 24 hours/day Type of Home: House Home Access: Stairs to enter [employer] of Steps: 1   Home Layout: One level     Bathroom Shower/Tub: Teacher, early years/pre: Standard     Home Equipment: Shower seat;Grab bars - tub/shower;Bedside commode          Prior Functioning/Environment Level of Independence: Needs assistance        Comments: Reports independent with driving, shopping, walking, and ADLs up until last 2 month.  Reports he has been sick last couple months and had a couple falls related to needing pacemaker.  Since that time still doing adls and ambulation but daughter assisting w iadls.        OT Problem List: Decreased strength;Decreased range of motion;Decreased activity tolerance;Impaired balance (sitting and/or standing);Decreased knowledge of use of DME or AE;Decreased safety awareness;Cardiopulmonary status limiting activity      OT Treatment/Interventions: Self-care/ADL training;Therapeutic exercise;Energy conservation;DME and/or AE instruction;Therapeutic activities;Patient/family education;Balance training    OT Goals(Current goals can be found in the care plan section) Acute Rehab OT Goals Patient Stated Goal: return home once better OT Goal Formulation: With patient Time For Goal Achievement: 11/18/20 Potential to Achieve Goals: Good  OT Frequency: Min 2X/week   Barriers to D/C:            Co-evaluation              AM-PAC OT "6 Clicks" Daily Activity     Outcome Measure Help from another person eating meals?: None Help from another person taking care of personal grooming?: A Little Help from another person toileting, which includes using toliet, bedpan, or urinal?: A Little Help from another person bathing (including washing, rinsing, drying)?: A Little Help from another person to put on and taking off regular upper body clothing?: A Little Help from another person to put on and taking off regular lower body clothing?: A Little 6 Click Score: 19   End of Session Equipment Utilized During  Treatment: Oxygen Nurse Communication: Mobility status  Activity Tolerance: Patient tolerated treatment well Patient left: in bed;with call bell/phone within reach;with bed alarm set  OT Visit Diagnosis: Muscle weakness (generalized) (M62.81);Unsteadiness on feet (R26.81)                Time: 8295-6213 OT Time Calculation (min): 30 min Charges:  OT General Charges $OT Visit: 1 Visit OT Evaluation $OT Eval Moderate Complexity: 1 Mod OT Treatments $Self Care/Home Management : 8-22 mins  Lou Cal, OT Acute Rehabilitation Services Pager [phone] Office [phone]   Raymondo Band 11/04/2020, 11:50 AM

## 2020-11-04 NOTE — Progress Notes (Signed)
Patient discharging home. Vital signs stable at time of discharge as reflected in discharge summary. Discharge instructions given to patient and patient's daughter, verbal understanding returned. Patient discharged with 4 oxygen tanks.

## 2020-11-04 NOTE — Progress Notes (Signed)
SATURATION QUALIFICATIONS: (This note is used to comply with regulatory documentation for home oxygen)  Patient Saturations on Room Air at Rest = 87%  Patient Saturations on Room Air while Ambulating ~ 85%  Patient Saturations on 4 Liters of oxygen while Ambulating = 87%  Please briefly explain why patient needs home oxygen: pt requiring supplemental O2 to maintain SpO2 >/=87%   Full progress/evaluation noted to follow   Lou Cal, [place] Pager [phone] Office [phone]

## 2020-11-04 NOTE — Care Management Important Message (Signed)
Important Message  Patient Details  Name: Zachary Santos MRN: [id number] Date of Birth: 27-Mar-1936   Medicare Important Message Given:  Yes - Important Message mailed due to current National Emergency  Verbal consent obtained due to current National Emergency  Relationship to patient: Self Contact Name: Connor Meacham Call Date: 11/04/20  Time: [place] Phone: [phone] Outcome: No Answer/Busy Important Message mailed to: Patient address on file    Delorse Lek 11/04/2020, 4:06 PM

## 2020-11-05 ENCOUNTER — Telehealth: Payer: Self-pay

## 2020-11-05 DIAGNOSIS — E785 Hyperlipidemia, unspecified: Secondary | ICD-10-CM | POA: Diagnosis not present

## 2020-11-05 DIAGNOSIS — E1169 Type 2 diabetes mellitus with other specified complication: Secondary | ICD-10-CM | POA: Diagnosis not present

## 2020-11-05 DIAGNOSIS — I129 Hypertensive chronic kidney disease with stage 1 through stage 4 chronic kidney disease, or unspecified chronic kidney disease: Secondary | ICD-10-CM | POA: Diagnosis not present

## 2020-11-05 DIAGNOSIS — J449 Chronic obstructive pulmonary disease, unspecified: Secondary | ICD-10-CM | POA: Diagnosis not present

## 2020-11-05 DIAGNOSIS — N1831 Chronic kidney disease, stage 3a: Secondary | ICD-10-CM | POA: Diagnosis not present

## 2020-11-05 DIAGNOSIS — E1165 Type 2 diabetes mellitus with hyperglycemia: Secondary | ICD-10-CM | POA: Diagnosis not present

## 2020-11-05 DIAGNOSIS — M6281 Muscle weakness (generalized): Secondary | ICD-10-CM | POA: Diagnosis not present

## 2020-11-05 DIAGNOSIS — U071 COVID-19: Secondary | ICD-10-CM | POA: Diagnosis not present

## 2020-11-05 DIAGNOSIS — J1282 Pneumonia due to coronavirus disease 2019: Secondary | ICD-10-CM | POA: Diagnosis not present

## 2020-11-05 DIAGNOSIS — Z7984 Long term (current) use of oral hypoglycemic drugs: Secondary | ICD-10-CM | POA: Diagnosis not present

## 2020-11-05 LAB — CULTURE, BLOOD (ROUTINE X 2): Special Requests: ADEQUATE

## 2020-11-05 NOTE — Telephone Encounter (Signed)
Transition Care Management Unsuccessful Follow-up Telephone Call  Date of discharge and from where:  11/04/20 from [facility]  Attempts:  1st Attempt  Reason for unsuccessful TCM follow-up call:  Unable to leave message. Will follow.

## 2020-11-05 NOTE — Telephone Encounter (Signed)
Noted. Will follow.  

## 2020-11-05 NOTE — Telephone Encounter (Signed)
Pt was scheduled for hospital follow up with Dr. Caryl Bis on 11/12/20 virtual appt due to pt having covid.

## 2020-11-06 NOTE — Telephone Encounter (Signed)
Transition Care Management Unsuccessful Follow-up Telephone Call  Date of discharge and from where:  11/04/20 from [facility]  Attempts:  2nd Attempt  Reason for unsuccessful TCM follow-up call:  Unable to leave message.

## 2020-11-07 ENCOUNTER — Telehealth: Payer: Medicare Other | Admitting: Family Medicine

## 2020-11-07 NOTE — Telephone Encounter (Signed)
Transition Care Management Follow-up Telephone Call  Date of discharge and from where: 11/04/20 from [facility]   How have you been since you were released from the hospital? Information received from daughter(HIPAA compliant).Starting today, he has not wanted to take medication as directed. He tries to hide it or only take half of the medication. Reports patient says "I take it when I feel like it." Appetite is good. Stays hydrated. Getting stronger. Denies n/v/d, cough, chest pain, headache and all other symptoms.   Any questions or concerns? No  Items Reviewed:  Did the pt receive and understand the discharge instructions provided? Yes   Medications obtained and verified? Yes , daughter verify and assist  Other? Yes , oxygen 4L daily  Any new allergies since your discharge? No   Dietary orders reviewed? Cardiac/diabetic diet  Do you have support at home? Yes   Home Care and Equipment/Supplies: Home health OT/PT services ordered? Yes OT/PT evaluation complete. Visits 2 times weekly; Recommendation for [facility] aide for vital sign monitoring via PT in             process.   Functional Questionnaire: (I = Independent and D = Dependent) ADLs: i  Bathing/Dressing- i  Eating- i  Maintaining continence- i  Transferring/Ambulation- walker  Managing Meds- daughter assist  Follow up appointments reviewed:   PCP Hospital f/u appt confirmed? Yes  Scheduled to see Dr. Caryl Bis on 11/12/20 @ 11:30, virtual visit [phone] .  Specialty Clinic- Daughter plans to schedule appointment with Cardiology after visit with pcp. Confirmed Cardiology contact number.   Are transportation arrangements needed? No   If their condition worsens, is the pt aware to call PCP or go to the Emergency Dept.? Yes  Was the patient provided with contact information for the PCP's office or ED? Yes  Was to pt encouraged to call back with questions or concerns? Yes

## 2020-11-10 NOTE — Telephone Encounter (Signed)
Reviewed

## 2020-11-11 ENCOUNTER — Telehealth: Payer: Self-pay | Admitting: Family Medicine

## 2020-11-11 DIAGNOSIS — I129 Hypertensive chronic kidney disease with stage 1 through stage 4 chronic kidney disease, or unspecified chronic kidney disease: Secondary | ICD-10-CM | POA: Diagnosis not present

## 2020-11-11 DIAGNOSIS — J449 Chronic obstructive pulmonary disease, unspecified: Secondary | ICD-10-CM | POA: Diagnosis not present

## 2020-11-11 DIAGNOSIS — E785 Hyperlipidemia, unspecified: Secondary | ICD-10-CM | POA: Diagnosis not present

## 2020-11-11 DIAGNOSIS — M6281 Muscle weakness (generalized): Secondary | ICD-10-CM | POA: Diagnosis not present

## 2020-11-11 DIAGNOSIS — J1282 Pneumonia due to coronavirus disease 2019: Secondary | ICD-10-CM | POA: Diagnosis not present

## 2020-11-11 DIAGNOSIS — Z7984 Long term (current) use of oral hypoglycemic drugs: Secondary | ICD-10-CM | POA: Diagnosis not present

## 2020-11-11 DIAGNOSIS — E1169 Type 2 diabetes mellitus with other specified complication: Secondary | ICD-10-CM | POA: Diagnosis not present

## 2020-11-11 DIAGNOSIS — U071 COVID-19: Secondary | ICD-10-CM | POA: Diagnosis not present

## 2020-11-11 DIAGNOSIS — N1831 Chronic kidney disease, stage 3a: Secondary | ICD-10-CM | POA: Diagnosis not present

## 2020-11-11 DIAGNOSIS — E1165 Type 2 diabetes mellitus with hyperglycemia: Secondary | ICD-10-CM | POA: Diagnosis not present

## 2020-11-11 NOTE — Telephone Encounter (Signed)
Zachary Santos from [facility] home health called in about patient not being in compliance by not taking his medications but she wanted you to call his daughter [phone]

## 2020-11-11 NOTE — Telephone Encounter (Signed)
Called and spoke to Patients Daughter Zachary Santos. Zachary Santos states that Zachary Santos is not taking his medications regularly. He will take them when he feels like he needs them and then not take them when he feels better. Zachary Santos asks to discuss options on getting him to take his medication tomorrow during the video visit with Dr. Caryl Bis.

## 2020-11-11 NOTE — Telephone Encounter (Signed)
Noted. Will plan to discuss tomorrow.

## 2020-11-12 ENCOUNTER — Telehealth (INDEPENDENT_AMBULATORY_CARE_PROVIDER_SITE_OTHER): Payer: Medicare Other | Admitting: Family Medicine

## 2020-11-12 ENCOUNTER — Encounter: Payer: Self-pay | Admitting: Family Medicine

## 2020-11-12 DIAGNOSIS — E1165 Type 2 diabetes mellitus with hyperglycemia: Secondary | ICD-10-CM | POA: Diagnosis not present

## 2020-11-12 DIAGNOSIS — E785 Hyperlipidemia, unspecified: Secondary | ICD-10-CM | POA: Diagnosis not present

## 2020-11-12 DIAGNOSIS — M6281 Muscle weakness (generalized): Secondary | ICD-10-CM | POA: Diagnosis not present

## 2020-11-12 DIAGNOSIS — Z7984 Long term (current) use of oral hypoglycemic drugs: Secondary | ICD-10-CM | POA: Diagnosis not present

## 2020-11-12 DIAGNOSIS — E1169 Type 2 diabetes mellitus with other specified complication: Secondary | ICD-10-CM | POA: Diagnosis not present

## 2020-11-12 DIAGNOSIS — N1831 Chronic kidney disease, stage 3a: Secondary | ICD-10-CM | POA: Diagnosis not present

## 2020-11-12 DIAGNOSIS — G4700 Insomnia, unspecified: Secondary | ICD-10-CM

## 2020-11-12 DIAGNOSIS — U071 COVID-19: Secondary | ICD-10-CM | POA: Diagnosis not present

## 2020-11-12 DIAGNOSIS — J1282 Pneumonia due to coronavirus disease 2019: Secondary | ICD-10-CM | POA: Diagnosis not present

## 2020-11-12 DIAGNOSIS — I442 Atrioventricular block, complete: Secondary | ICD-10-CM | POA: Diagnosis not present

## 2020-11-12 DIAGNOSIS — I129 Hypertensive chronic kidney disease with stage 1 through stage 4 chronic kidney disease, or unspecified chronic kidney disease: Secondary | ICD-10-CM | POA: Diagnosis not present

## 2020-11-12 DIAGNOSIS — J449 Chronic obstructive pulmonary disease, unspecified: Secondary | ICD-10-CM | POA: Diagnosis not present

## 2020-11-12 NOTE — Assessment & Plan Note (Signed)
Chronic ongoing issue.  I suspect his restless leg syndrome is contributing to this.  We will trial gabapentin at night.

## 2020-11-12 NOTE — Assessment & Plan Note (Addendum)
I advised the patient to get a follow-up with cardiology scheduled.  He and his daughter both noted they would work on this.

## 2020-11-12 NOTE — Progress Notes (Signed)
Virtual Visit via vide Note  This visit type was conducted due to national recommendations for restrictions regarding the COVID-19 pandemic (e.g. social distancing).  This format is felt to be most appropriate for this patient at this time.  All issues noted in this document were discussed and addressed.  No physical exam was performed (except for noted visual exam findings with Video Visits).   I connected with Zachary Santos today at 11:30 AM EST by a video enabled telemedicine application and verified that I am speaking with the correct person using two identifiers. Location patient: home Location provider: home office Persons participating in the virtual visit: patient, provider, Nichlas Pitera (wife), Harlin Heys (daughter)  I discussed the limitations, risks, security and privacy concerns of performing an evaluation and management service by telephone and the availability of in person appointments. I also discussed with the patient that there may be a patient responsible charge related to this service. The patient expressed understanding and agreed to proceed.  Reason for visit: f/u  HPI: DIABETES Disease Monitoring: Blood Sugar ranges-269 today Polyuria/phagia/dipsia- no      Optho- UTD Medications: Compliance- taking metformin, tradjenta (though seems to have been of of this for a while), glipizide Hypoglycemic symptoms- no The patient's daughter notes he has been inconsistently taking his medication. A1c in the hospital was 8.6.  Insomnia/Restless leg syndrome: This continues to be an issue.  He notes he has trouble falling asleep and staying asleep.  Notes his legs just want to jerk at times randomly.  COVID-19: Patient was fairly ill from this.  He was hospitalized and completed a course of remdesivir and Solu-Medrol and was discharged on prednisone.  He has not completed the prednisone yet as he has not been taking it daily.  He continues on 4 L nasal cannula.  He notes no cough or  shortness of breath.  His appetite is sporadic as his taste is off from having had COVID-19.  He is working with home health for physical therapy and nursing.  They are working on building his strength and getting him off of the oxygen.  He is drinking lots of fluids.  He was drinking lots of orange juice though the home health nurse advised this was not good and recommended Gatorade zero.    ROS: See pertinent positives and negatives per HPI.  Past Medical History:  Diagnosis Date  . Anxiety   . BPH (benign prostatic hyperplasia)   . Chronic fatigue   . CKD (chronic kidney disease), stage III ([place])   . Claudication [facility])    a. R Hip.  [name] COPD (chronic obstructive pulmonary disease) ([employer])   . Dyspnea    with exertion  . Essential hypertension    a. 01/2008 Echo: EF 65%, no rwma, mod increased PASP.  [name] Hypercholesterolemia   . Osteoarthritis   . Paget's disease   . Syncope    a. 03/2015 in setting of hypotension.  . Tobacco abuse    a. 1-1.5 ppd x 60+ yrs.  . Type II diabetes mellitus ([place])   . Wears partial dentures    uppers    Past Surgical History:  Procedure Laterality Date  . APPENDECTOMY    . CATARACT EXTRACTION W/PHACO Left 12/11/2019   Procedure: CATARACT EXTRACTION PHACO AND INTRAOCULAR LENS PLACEMENT ([place]) LEFT DIABETIC MALYUGIN;  Surgeon: Birder Robson, MD;  Location: [place];  Service: Ophthalmology;  Laterality: Left;  CDE 11.45 U/S  1:05.7  . CATARACT EXTRACTION W/PHACO Right 01/08/2020  Procedure: CATARACT EXTRACTION PHACO AND INTRAOCULAR LENS PLACEMENT ([facility]) RIGHT DIABETIC 5.75  00:49.3;  Surgeon: Birder Robson, MD;  Location: [place];  Service: Ophthalmology;  Laterality: Right;  Diabetic  . laparscopic chole    . PACEMAKER IMPLANT N/A 10/08/2020   Procedure: PACEMAKER IMPLANT;  Surgeon: Vickie Epley, MD;  Location: [place] CV LAB;  Service: Cardiovascular;  Laterality: N/A;  . SPINE SURGERY     cervical spine surgery   . trigeminal neuralgia     with surgery in [place]    Family History  Problem Relation Age of Onset  . Cancer Mother        died @ 76.  [name] COPD Father        died @ 21.  . Diabetes Brother        died in early 40's.    SOCIAL HX: Smoker   Current Outpatient Medications:  .  amiodarone (PACERONE) 200 MG tablet, Take 1 tablet by mouth two times a day for one week, then take 1 tablet by mouth once daily. (Patient taking differently: Take 200 mg by mouth daily.), Disp: 35 tablet, Rfl: 3 .  amLODipine (NORVASC) 5 MG tablet, Take 1 tablet (5 mg total) by mouth daily., Disp: 30 tablet, Rfl: 2 .  apixaban (ELIQUIS) 5 MG TABS tablet, Take 1 tablet (5 mg total) by mouth 2 (two) times daily., Disp: 180 tablet, Rfl: 3 .  blood glucose meter kit and supplies KIT, Dispense based on patient and insurance preference. Use up to four times daily as directed. (FOR ICD-9 250.00, 250.01)., Disp: 1 each, Rfl: 0 .  glipiZIDE (GLUCOTROL) 5 MG tablet, Take 0.5 tablets (2.5 mg total) by mouth 2 (two) times daily., Disp: 30 tablet, Rfl: 2 .  guaiFENesin-dextromethorphan (ROBITUSSIN DM) 100-10 MG/5ML syrup, Take 10 mLs by mouth every 4 (four) hours as needed for cough., Disp: 118 mL, Rfl: 0 .  lisinopril (ZESTRIL) 20 MG tablet, Take 1 tablet (20 mg total) by mouth daily., Disp: 90 tablet, Rfl: 3 .  metFORMIN (GLUCOPHAGE XR) 500 MG 24 hr tablet, Take 1 tablet (500 mg total) by mouth in the morning and at bedtime., Disp: 180 tablet, Rfl: 1 .  ONE TOUCH ULTRA TEST test strip, USE TEST STRIP TO CHECK GLUCOSE AT LEAST ONCE DAILY, Disp: 50 each, Rfl: 11 .  rosuvastatin (CRESTOR) 20 MG tablet, Take 1 tablet by mouth once daily (Patient taking differently: Take 20 mg by mouth daily.), Disp: 90 tablet, Rfl: 0 .  tamsulosin (FLOMAX) 0.4 MG CAPS capsule, Take 2 capsules by mouth once daily (Patient taking differently: Take 0.4 mg by mouth 2 (two) times daily.), Disp: 180 capsule, Rfl: 3 .  triamcinolone (KENALOG) 0.1 %,  Apply 1 application topically 2 (two) times daily., Disp: 30 g, Rfl: 0 .  vitamin B-12 (CYANOCOBALAMIN) 500 MCG tablet, Take 500 mcg by mouth daily., Disp: , Rfl:  .  linagliptin (TRADJENTA) 5 MG TABS tablet, Take 5 mg by mouth daily., Disp: , Rfl:   EXAM:  VITALS per patient if applicable:  GENERAL: alert, oriented, appears chronically ill, in no acute distress  HEENT: atraumatic, conjunttiva clear, no obvious abnormalities on inspection of external nose and ears  NECK: normal movements of the head and neck  LUNGS: on inspection no signs of respiratory distress, breathing rate appears normal, no obvious gross SOB, gasping or wheezing  CV: no obvious cyanosis  MS: moves all visible extremities without noticeable abnormality  PSYCH/NEURO: pleasant and cooperative, no  obvious depression or anxiety, speech and thought processing grossly intact  ASSESSMENT AND PLAN:  Discussed the following assessment and plan:  Problem List Items Addressed This Visit    Complete heart block ([place])    I advised the patient to get a follow-up with cardiology scheduled.  He and his daughter both noted they would work on this.      Insomnia    Chronic ongoing issue.  I suspect his restless leg syndrome is contributing to this.  We will trial gabapentin at night.      Pneumonia due to COVID-19 virus    Patient seems to be improving slowly.  We will continue to work with home health physical therapy and nursing.  I encouraged him to complete his course of prednisone.  He will monitor for any worsening symptoms.  Discussed it may take weeks or months for him to completely improve.  Discussed his taste should come back slowly over time.  We will try to arrange for lab work through his home health company.      Type 2 diabetes mellitus with hyperglycemia, without long-term current use of insulin (HCC)    Uncontrolled.  I encouraged him to consistently take his medications each day.  He will continue  Metformin 500 mg twice daily, glipizide 2.5 mg twice daily, and Tradjenta 5 mg daily.  He will check his blood sugar fasting once daily.  We will have him follow-up in 1 to 2 weeks to see what his blood sugars are doing for medication adjustment.          I discussed the assessment and treatment plan with the patient. The patient was provided an opportunity to ask questions and all were answered. The patient agreed with the plan and demonstrated an understanding of the instructions.   The patient was advised to call back or seek an in-person evaluation if the symptoms worsen or if the condition fails to improve as anticipated.   [name], MD

## 2020-11-12 NOTE — Assessment & Plan Note (Signed)
Uncontrolled.  I encouraged him to consistently take his medications each day.  He will continue Metformin 500 mg twice daily, glipizide 2.5 mg twice daily, and Tradjenta 5 mg daily.  He will check his blood sugar fasting once daily.  We will have him follow-up in 1 to 2 weeks to see what his blood sugars are doing for medication adjustment.

## 2020-11-12 NOTE — Assessment & Plan Note (Signed)
Patient seems to be improving slowly.  We will continue to work with home health physical therapy and nursing.  I encouraged him to complete his course of prednisone.  He will monitor for any worsening symptoms.  Discussed it may take weeks or months for him to completely improve.  Discussed his taste should come back slowly over time.  We will try to arrange for lab work through his home health company.

## 2020-11-13 ENCOUNTER — Telehealth: Payer: Self-pay | Admitting: Family Medicine

## 2020-11-13 ENCOUNTER — Telehealth: Payer: Self-pay

## 2020-11-13 DIAGNOSIS — J449 Chronic obstructive pulmonary disease, unspecified: Secondary | ICD-10-CM | POA: Diagnosis not present

## 2020-11-13 DIAGNOSIS — N1831 Chronic kidney disease, stage 3a: Secondary | ICD-10-CM | POA: Diagnosis not present

## 2020-11-13 DIAGNOSIS — M6281 Muscle weakness (generalized): Secondary | ICD-10-CM | POA: Diagnosis not present

## 2020-11-13 DIAGNOSIS — E1169 Type 2 diabetes mellitus with other specified complication: Secondary | ICD-10-CM | POA: Diagnosis not present

## 2020-11-13 DIAGNOSIS — U071 COVID-19: Secondary | ICD-10-CM | POA: Diagnosis not present

## 2020-11-13 DIAGNOSIS — Z7984 Long term (current) use of oral hypoglycemic drugs: Secondary | ICD-10-CM | POA: Diagnosis not present

## 2020-11-13 DIAGNOSIS — E1165 Type 2 diabetes mellitus with hyperglycemia: Secondary | ICD-10-CM | POA: Diagnosis not present

## 2020-11-13 DIAGNOSIS — I129 Hypertensive chronic kidney disease with stage 1 through stage 4 chronic kidney disease, or unspecified chronic kidney disease: Secondary | ICD-10-CM | POA: Diagnosis not present

## 2020-11-13 DIAGNOSIS — E785 Hyperlipidemia, unspecified: Secondary | ICD-10-CM | POA: Diagnosis not present

## 2020-11-13 DIAGNOSIS — J1282 Pneumonia due to coronavirus disease 2019: Secondary | ICD-10-CM | POA: Diagnosis not present

## 2020-11-13 NOTE — Telephone Encounter (Signed)
Call could not be completed at this time. Could not leave a voice message to call back.

## 2020-11-13 NOTE — Telephone Encounter (Signed)
Patient need to schedule a 2 week f/u

## 2020-11-13 NOTE — Telephone Encounter (Signed)
Received a call from Physical therapist Stanton Kidney from [facility]. They arrived at the home of Zachary Santos and was informed that he fell into his dresser and slid down the front of it at 9am on 11/13/2020. Patient has a red mark where he hit the dresser on his left side and it is tender to the touch. [name] also wanted to report that his Blood Sugar is at 397 and asks if that is acceptable.

## 2020-11-13 NOTE — Telephone Encounter (Signed)
His blood sugar 397 is elevated well above what I would like it to be.  I would suggest that we add additional medication for his diabetes.  Would he be willing to give himself a weekly injection?  Given the fall and the injury it would be best if he can be evaluated in person at an urgent care to help determine if he broke any ribs.

## 2020-11-13 NOTE — Progress Notes (Signed)
I called [facility] and got the CBC and CMP labs ordered. I spoke to Caspar and she verbalized understanding and stated they should be back within the week.

## 2020-11-17 DIAGNOSIS — E1169 Type 2 diabetes mellitus with other specified complication: Secondary | ICD-10-CM | POA: Diagnosis not present

## 2020-11-17 DIAGNOSIS — N1831 Chronic kidney disease, stage 3a: Secondary | ICD-10-CM | POA: Diagnosis not present

## 2020-11-17 DIAGNOSIS — Z7984 Long term (current) use of oral hypoglycemic drugs: Secondary | ICD-10-CM | POA: Diagnosis not present

## 2020-11-17 DIAGNOSIS — I129 Hypertensive chronic kidney disease with stage 1 through stage 4 chronic kidney disease, or unspecified chronic kidney disease: Secondary | ICD-10-CM | POA: Diagnosis not present

## 2020-11-17 DIAGNOSIS — U071 COVID-19: Secondary | ICD-10-CM | POA: Diagnosis not present

## 2020-11-17 DIAGNOSIS — M6281 Muscle weakness (generalized): Secondary | ICD-10-CM | POA: Diagnosis not present

## 2020-11-17 DIAGNOSIS — E1165 Type 2 diabetes mellitus with hyperglycemia: Secondary | ICD-10-CM | POA: Diagnosis not present

## 2020-11-17 DIAGNOSIS — E785 Hyperlipidemia, unspecified: Secondary | ICD-10-CM | POA: Diagnosis not present

## 2020-11-17 DIAGNOSIS — J1282 Pneumonia due to coronavirus disease 2019: Secondary | ICD-10-CM | POA: Diagnosis not present

## 2020-11-17 DIAGNOSIS — J449 Chronic obstructive pulmonary disease, unspecified: Secondary | ICD-10-CM | POA: Diagnosis not present

## 2020-11-19 DIAGNOSIS — E1169 Type 2 diabetes mellitus with other specified complication: Secondary | ICD-10-CM | POA: Diagnosis not present

## 2020-11-19 DIAGNOSIS — J449 Chronic obstructive pulmonary disease, unspecified: Secondary | ICD-10-CM | POA: Diagnosis not present

## 2020-11-19 DIAGNOSIS — U071 COVID-19: Secondary | ICD-10-CM | POA: Diagnosis not present

## 2020-11-19 DIAGNOSIS — E785 Hyperlipidemia, unspecified: Secondary | ICD-10-CM | POA: Diagnosis not present

## 2020-11-19 DIAGNOSIS — M6281 Muscle weakness (generalized): Secondary | ICD-10-CM | POA: Diagnosis not present

## 2020-11-19 DIAGNOSIS — E1165 Type 2 diabetes mellitus with hyperglycemia: Secondary | ICD-10-CM | POA: Diagnosis not present

## 2020-11-19 DIAGNOSIS — Z7984 Long term (current) use of oral hypoglycemic drugs: Secondary | ICD-10-CM | POA: Diagnosis not present

## 2020-11-19 DIAGNOSIS — J1282 Pneumonia due to coronavirus disease 2019: Secondary | ICD-10-CM | POA: Diagnosis not present

## 2020-11-19 DIAGNOSIS — I129 Hypertensive chronic kidney disease with stage 1 through stage 4 chronic kidney disease, or unspecified chronic kidney disease: Secondary | ICD-10-CM | POA: Diagnosis not present

## 2020-11-19 DIAGNOSIS — N1831 Chronic kidney disease, stage 3a: Secondary | ICD-10-CM | POA: Diagnosis not present

## 2020-11-20 ENCOUNTER — Telehealth: Payer: Self-pay

## 2020-11-20 NOTE — Chronic Care Management (AMB) (Signed)
  Care Management   Note  11/20/2020 Name: Zachary Santos MRN: [id number] DOB: 03/16/1936  Zachary Santos is a 85 y.o. year old male who is a primary care patient of Leone Haven, MD and is actively engaged with the care management team. I reached out to Zachary Santos by phone today to assist with re-scheduling a follow up visit with the Pharmacist  Follow up plan: Unsuccessful telephone outreach attempt made. A HIPAA compliant phone message was left for the patient providing contact information and requesting a return call.  The care management team will reach out to the patient again over the next 7 days.  If patient returns call to provider office, please advise to call [facility]  at [place], [address], [place], [place] [address] Direct Dial: [phone] [email or web address] Website: [email or web address]

## 2020-11-21 ENCOUNTER — Ambulatory Visit: Payer: Medicare Other | Admitting: Cardiology

## 2020-11-21 DIAGNOSIS — Z7984 Long term (current) use of oral hypoglycemic drugs: Secondary | ICD-10-CM | POA: Diagnosis not present

## 2020-11-21 DIAGNOSIS — J1282 Pneumonia due to coronavirus disease 2019: Secondary | ICD-10-CM | POA: Diagnosis not present

## 2020-11-21 DIAGNOSIS — I129 Hypertensive chronic kidney disease with stage 1 through stage 4 chronic kidney disease, or unspecified chronic kidney disease: Secondary | ICD-10-CM | POA: Diagnosis not present

## 2020-11-21 DIAGNOSIS — E1165 Type 2 diabetes mellitus with hyperglycemia: Secondary | ICD-10-CM | POA: Diagnosis not present

## 2020-11-21 DIAGNOSIS — U071 COVID-19: Secondary | ICD-10-CM | POA: Diagnosis not present

## 2020-11-21 DIAGNOSIS — J449 Chronic obstructive pulmonary disease, unspecified: Secondary | ICD-10-CM | POA: Diagnosis not present

## 2020-11-21 DIAGNOSIS — M6281 Muscle weakness (generalized): Secondary | ICD-10-CM | POA: Diagnosis not present

## 2020-11-21 DIAGNOSIS — E1169 Type 2 diabetes mellitus with other specified complication: Secondary | ICD-10-CM | POA: Diagnosis not present

## 2020-11-21 DIAGNOSIS — E785 Hyperlipidemia, unspecified: Secondary | ICD-10-CM | POA: Diagnosis not present

## 2020-11-21 DIAGNOSIS — N1831 Chronic kidney disease, stage 3a: Secondary | ICD-10-CM | POA: Diagnosis not present

## 2020-11-23 NOTE — Progress Notes (Deleted)
Cardiology Office Note Date:  11/23/2020  Patient ID:  Santos, Zachary [birth year], MRN [id number] PCP:  Leone Haven, MD  Cardiologist:  Kate Sable, MD Electrophysiologist: ***  ***refresh   Chief Complaint: *** wound check  History of Present Illness: Zachary Santos is a 85 y.o. male with history of HTN, HLD, DM, AFib, COPD, smoker, CKD (III), CHB w/PPM.  Admitted to [facility] Oct 07 2020 with syncope, found with advanced heart block and underwent PPM implant discharged 10/08/20.  Readmitted 10/31/20 with worsening malaise, cough, failure to , found febrile, hypoxic, COVID + Discharged 11/04/20, completed 5-day course of IV remdesivir today 1/18.  He has also remained on Solu-Medrol IV 40 mg twice daily.  We will discharge him home on prednisone for next 5 .  Has completed 3weeks   *** wound *** amiodarone labs *** eliquis, bleeding, labs, dose *** remotes  Device information SJM dual chamber PPM implant 10/08/20  Afib Hx Diagnosed July 2020  AAD Hx Amiodarone July 2021   Past Medical History:  Diagnosis Date  . Anxiety   . BPH (benign prostatic hyperplasia)   . Chronic fatigue   . CKD (chronic kidney disease), stage III ([place])   . Claudication [facility])    a. R Hip.  [name] COPD (chronic obstructive pulmonary disease) ([place])   . Dyspnea    with exertion  . Essential hypertension    a. 01/2008 Echo: EF 65%, no rwma, mod increased PASP.  [name] Hypercholesterolemia   . Osteoarthritis   . Paget's disease   . Syncope    a. 03/2015 in setting of hypotension.  . Tobacco abuse    a. 1-1.5 ppd x 60+ yrs.  . Type II diabetes mellitus ([place])   . Wears partial dentures    uppers    Past Surgical History:  Procedure Laterality Date  . APPENDECTOMY    . CATARACT EXTRACTION W/PHACO Left 12/11/2019   Procedure: CATARACT EXTRACTION PHACO AND INTRAOCULAR LENS PLACEMENT ([place]) LEFT DIABETIC MALYUGIN;  Surgeon: Birder Robson, MD;  Location: [place];  Service:  Ophthalmology;  Laterality: Left;  CDE 11.45 U/S  1:05.7  . CATARACT EXTRACTION W/PHACO Right 01/08/2020   Procedure: CATARACT EXTRACTION PHACO AND INTRAOCULAR LENS PLACEMENT ([facility]) RIGHT DIABETIC 5.75  00:49.3;  Surgeon: Birder Robson, MD;  Location: [place];  Service: Ophthalmology;  Laterality: Right;  Diabetic  . laparscopic chole    . PACEMAKER IMPLANT N/A 10/08/2020   Procedure: PACEMAKER IMPLANT;  Surgeon: Vickie Epley, MD;  Location: [place] CV LAB;  Service: Cardiovascular;  Laterality: N/A;  . SPINE SURGERY     cervical spine surgery  . trigeminal neuralgia     with surgery in [place]    Current Outpatient Medications  Medication Sig Dispense Refill  . amiodarone (PACERONE) 200 MG tablet Take 1 tablet by mouth two times a day for one week, then take 1 tablet by mouth once daily. (Patient taking differently: Take 200 mg by mouth daily.) 35 tablet 3  . amLODipine (NORVASC) 5 MG tablet Take 1 tablet (5 mg total) by mouth daily. 30 tablet 2  . apixaban (ELIQUIS) 5 MG TABS tablet Take 1 tablet (5 mg total) by mouth 2 (two) times daily. 180 tablet 3  . blood glucose meter kit and supplies KIT Dispense based on patient and insurance preference. Use up to four times daily as directed. (FOR ICD-9 250.00, 250.01). 1 each 0  . glipiZIDE (GLUCOTROL) 5 MG tablet Take 0.5 tablets (  2.5 mg total) by mouth 2 (two) times daily. 30 tablet 2  . guaiFENesin-dextromethorphan (ROBITUSSIN DM) 100-10 MG/5ML syrup Take 10 mLs by mouth every 4 (four) hours as needed for cough. 118 mL 0  . linagliptin (TRADJENTA) 5 MG TABS tablet Take 5 mg by mouth daily.    [name] lisinopril (ZESTRIL) 20 MG tablet Take 1 tablet (20 mg total) by mouth daily. 90 tablet 3  . metFORMIN (GLUCOPHAGE XR) 500 MG 24 hr tablet Take 1 tablet (500 mg total) by mouth in the morning and at bedtime. 180 tablet 1  . ONE TOUCH ULTRA TEST test strip USE TEST STRIP TO CHECK GLUCOSE AT LEAST ONCE DAILY 50 each 11  .  rosuvastatin (CRESTOR) 20 MG tablet Take 1 tablet by mouth once daily (Patient taking differently: Take 20 mg by mouth daily.) 90 tablet 0  . tamsulosin (FLOMAX) 0.4 MG CAPS capsule Take 2 capsules by mouth once daily (Patient taking differently: Take 0.4 mg by mouth 2 (two) times daily.) 180 capsule 3  . triamcinolone (KENALOG) 0.1 % Apply 1 application topically 2 (two) times daily. 30 g 0  . vitamin B-12 (CYANOCOBALAMIN) 500 MCG tablet Take 500 mcg by mouth daily.     No current facility-administered medications for this visit.    Allergies:   Other   Social History:  The patient  reports that he has been smoking cigarettes. He has a 90.00 pack-year smoking history. He has quit using smokeless tobacco. He reports previous alcohol use. He reports that he does not use drugs.   Family History:  The patient's family history includes COPD in his father; Cancer in his mother; Diabetes in his brother.  ROS:  Please see the history of present illness.    All other systems are reviewed and otherwise negative.   PHYSICAL EXAM:  VS:  There were no vitals taken for this visit. BMI: There is no height or weight on file to calculate BMI. Well nourished, well developed, in no acute distress HEENT: normocephalic, atraumatic Neck: no JVD, carotid bruits or masses Cardiac:  *** RRR; no significant murmurs, no rubs, or gallops Lungs:  *** CTA b/l, no wheezing, rhonchi or rales Abd: soft, nontender MS: no deformity or *** atrophy Ext: *** no edema Skin: warm and dry, no rash Neuro:  No gross deficits appreciated Psych: euthymic mood, full affect  *** PPM site is stable, no tethering or discomfort   EKG:  Not done today  Device interrogation done today and reviewed by myself:  ***  10/07/20: TTE IMPRESSIONS  1. Left ventricular ejection fraction, by estimation, is 55 to 60%. The  left ventricle has normal function. The left ventricle has no regional  wall motion abnormalities. There is mild  left ventricular hypertrophy.  Left ventricular diastolic parameters  were normal.  2. Right ventricular systolic function is normal. The right ventricular  size is normal. There is normal pulmonary artery systolic pressure.  3. The mitral valve is normal in structure. No evidence of mitral valve  regurgitation. No evidence of mitral stenosis.  4. The aortic valve is normal in structure. Aortic valve regurgitation is  not visualized. No aortic stenosis is present.  5. The inferior vena cava is normal in size with greater than 50%  respiratory variability, suggesting right atrial pressure of 3 mmHg.   Recent Labs: 10/07/2020: TSH 1.501 10/31/2020: B Natriuretic Peptide 206.3 11/04/2020: ALT 83; BUN 42; Creatinine, Ser 1.44; Hemoglobin 11.0; Magnesium 2.2; Platelets 194; Potassium 4.2; Sodium 139  05/27/2020: Cholesterol 131; HDL 24.60; LDL Cholesterol 76; Total CHOL/HDL Ratio 5; Triglycerides 153.0; VLDL 30.6   CrCl cannot be calculated (Unknown ideal weight.).   Wt Readings from Last 3 Encounters:  11/03/20 163 lb 2.3 oz (74 kg)  10/20/20 173 lb (78.5 kg)  10/08/20 181 lb 6.4 oz (82.3 kg)     Other studies reviewed: Additional studies/records reviewed today include: summarized above  ASSESSMENT AND PLAN:  1. PPM     ***  2. Paroxysmal Afib     CHA2DS2Vasc is 4, on Eliquis, *** appropriately dosed     Amiodarone     *** % burden  3. HTN     ***  Disposition: F/u with ***  Current medicines are reviewed at length with the patient today.  The patient did not have any concerns regarding medicines.  Venetia Night, PA-C 11/23/2020 6:56 PM     [address] [place] [address] [phone] (office)  [phone] (fax)

## 2020-11-24 ENCOUNTER — Other Ambulatory Visit: Payer: Self-pay

## 2020-11-24 ENCOUNTER — Emergency Department
Admission: EM | Admit: 2020-11-24 | Discharge: 2020-11-24 | Disposition: A | Discharge status: Home / Self Care | Payer: Medicare Other | Attending: Emergency Medicine | Admitting: Emergency Medicine

## 2020-11-24 ENCOUNTER — Telehealth: Payer: Self-pay

## 2020-11-24 DIAGNOSIS — F17210 Nicotine dependence, cigarettes, uncomplicated: Secondary | ICD-10-CM | POA: Diagnosis not present

## 2020-11-24 DIAGNOSIS — Z950 Presence of cardiac pacemaker: Secondary | ICD-10-CM | POA: Diagnosis not present

## 2020-11-24 DIAGNOSIS — R600 Localized edema: Secondary | ICD-10-CM | POA: Insufficient documentation

## 2020-11-24 DIAGNOSIS — I10 Essential (primary) hypertension: Secondary | ICD-10-CM | POA: Diagnosis not present

## 2020-11-24 DIAGNOSIS — I129 Hypertensive chronic kidney disease with stage 1 through stage 4 chronic kidney disease, or unspecified chronic kidney disease: Secondary | ICD-10-CM | POA: Insufficient documentation

## 2020-11-24 DIAGNOSIS — Z79899 Other long term (current) drug therapy: Secondary | ICD-10-CM | POA: Diagnosis not present

## 2020-11-24 DIAGNOSIS — E1122 Type 2 diabetes mellitus with diabetic chronic kidney disease: Secondary | ICD-10-CM | POA: Diagnosis not present

## 2020-11-24 DIAGNOSIS — M6281 Muscle weakness (generalized): Secondary | ICD-10-CM | POA: Diagnosis not present

## 2020-11-24 DIAGNOSIS — Z7901 Long term (current) use of anticoagulants: Secondary | ICD-10-CM | POA: Insufficient documentation

## 2020-11-24 DIAGNOSIS — E1165 Type 2 diabetes mellitus with hyperglycemia: Secondary | ICD-10-CM | POA: Insufficient documentation

## 2020-11-24 DIAGNOSIS — R21 Rash and other nonspecific skin eruption: Secondary | ICD-10-CM | POA: Insufficient documentation

## 2020-11-24 DIAGNOSIS — N1831 Chronic kidney disease, stage 3a: Secondary | ICD-10-CM | POA: Insufficient documentation

## 2020-11-24 DIAGNOSIS — E782 Mixed hyperlipidemia: Secondary | ICD-10-CM | POA: Diagnosis not present

## 2020-11-24 DIAGNOSIS — E785 Hyperlipidemia, unspecified: Secondary | ICD-10-CM | POA: Diagnosis not present

## 2020-11-24 DIAGNOSIS — E1169 Type 2 diabetes mellitus with other specified complication: Secondary | ICD-10-CM | POA: Diagnosis not present

## 2020-11-24 DIAGNOSIS — J449 Chronic obstructive pulmonary disease, unspecified: Secondary | ICD-10-CM | POA: Insufficient documentation

## 2020-11-24 DIAGNOSIS — Z7984 Long term (current) use of oral hypoglycemic drugs: Secondary | ICD-10-CM | POA: Diagnosis not present

## 2020-11-24 DIAGNOSIS — M7989 Other specified soft tissue disorders: Secondary | ICD-10-CM | POA: Diagnosis present

## 2020-11-24 DIAGNOSIS — Z8616 Personal history of COVID-19: Secondary | ICD-10-CM | POA: Diagnosis not present

## 2020-11-24 DIAGNOSIS — J1282 Pneumonia due to coronavirus disease 2019: Secondary | ICD-10-CM | POA: Diagnosis not present

## 2020-11-24 DIAGNOSIS — U071 COVID-19: Secondary | ICD-10-CM | POA: Diagnosis not present

## 2020-11-24 DIAGNOSIS — R609 Edema, unspecified: Secondary | ICD-10-CM

## 2020-11-24 LAB — BASIC METABOLIC PANEL
Anion gap: 7 (ref 5–15)
BUN: 16 mg/dL (ref 8–23)
CO2: 28 mmol/L (ref 22–32)
Calcium: 7.8 mg/dL — ABNORMAL LOW (ref 8.9–10.3)
Chloride: 106 mmol/L (ref 98–111)
Creatinine, Ser: 1.43 mg/dL — ABNORMAL HIGH (ref 0.61–1.24)
GFR, Estimated: 49 mL/min — ABNORMAL LOW (ref >60)
Glucose, Bld: 101 mg/dL — ABNORMAL HIGH (ref 70–99)
Potassium: 4.4 mmol/L (ref 3.5–5.1)
Sodium: 141 mmol/L (ref 135–145)

## 2020-11-24 LAB — TROPONIN I (HIGH SENSITIVITY): Troponin I (High Sensitivity): 12 ng/L (ref <18)

## 2020-11-24 LAB — LACTIC ACID, PLASMA: Lactic Acid, Venous: 2.1 mmol/L (ref 0.5–1.9)

## 2020-11-24 LAB — CBC
HCT: 30.3 % — ABNORMAL LOW (ref 39.0–52.0)
Hemoglobin: 9.4 g/dL — ABNORMAL LOW (ref 13.0–17.0)
MCH: 29.2 pg (ref 26.0–34.0)
MCHC: 31 g/dL (ref 30.0–36.0)
MCV: 94.1 fL (ref 80.0–100.0)
Platelets: 122 10*3/uL — ABNORMAL LOW (ref 150–400)
RBC: 3.22 MIL/uL — ABNORMAL LOW (ref 4.22–5.81)
RDW: 14.1 % (ref 11.5–15.5)
WBC: 5.9 10*3/uL (ref 4.0–10.5)
nRBC: 0 % (ref 0.0–0.2)

## 2020-11-24 MED ORDER — FUROSEMIDE 20 MG PO TABS: 20.0000 mg | ORAL_TABLET | Freq: Every day | ORAL | 0 refills | Status: DC

## 2020-11-24 MED ORDER — FUROSEMIDE 40 MG PO TABS
40.0000 mg | ORAL_TABLET | Freq: Once | ORAL | Status: AC
  Administered 2020-11-24: 40 mg via ORAL
  Filled 2020-11-24: qty 1

## 2020-11-24 NOTE — Telephone Encounter (Signed)
Zachary Santos PT assistant with [facility] called and states that pt's blood pressure is 80/41. She has checked it manually and with automatic machine. She also states that pt's legs are shiny, red, swollen, pins and needles, and red rash. Transferred to [facility] to triage.

## 2020-11-24 NOTE — ED Triage Notes (Addendum)
Pt comes via POV from home with c/o hypotesion and bilateral leg swelling. Pt states he has a home health nurse and she has noticed his BP declining over last few days. Pt also concerned about swelling and redness noted to bilateral legs. Pt has noticeable swelling, redness and pain to touch to bilateral legs. Pt is on blood thinners.  Pt states he noticed the swelling after his discharge from the hospital. Pt states it may have been the TED hose.  Pt denies any SOB, weakness, dizziness or CP. Pt wears 4L [place] chronically.

## 2020-11-24 NOTE — ED Provider Notes (Signed)
[facility] Emergency Department Provider Note  Time seen: 6:39 PM  I have reviewed the triage vital signs and the nursing notes.   HISTORY  Chief Complaint Hypotension and Leg Swelling   HPI Zachary Santos is a 85 y.o. male with a past medical history of anxiety, CKD, COPD, hypertension, hyperlipidemia, Paget's disease, presents to the emergency department for leg swelling and low blood pressure.  According to the patient he has a home health aide that comes and checks on him, noted today that his blood pressure was 80/60.  They have also noticed increased swelling in his lower extremities over the past several weeks since being discharged from the hospital 3 weeks ago after being infected with Covid.  He is also developed a rash to her lower extremities up to the mid abdomen that occurred after his Covid hospitalization as well but appears to be resolving.  Patient has significant tenderness to his lower extremities due to the swelling.  No reported fever.  Patient's blood pressure reassuring in the emergency department currently 120/53, without intervention.  Past Medical History:  Diagnosis Date  . Anxiety   . BPH (benign prostatic hyperplasia)   . Chronic fatigue   . CKD (chronic kidney disease), stage III ([place])   . Claudication [facility])    a. R Hip.  [name] COPD (chronic obstructive pulmonary disease) ([place])   . Dyspnea    with exertion  . Essential hypertension    a. 01/2008 Echo: EF 65%, no rwma, mod increased PASP.  [name] Hypercholesterolemia   . Osteoarthritis   . Paget's disease   . Syncope    a. 03/2015 in setting of hypotension.  . Tobacco abuse    a. 1-1.5 ppd x 60+ yrs.  . Type II diabetes mellitus ([place])   . Wears partial dentures    uppers    Patient Active Problem List   Diagnosis Date Noted  . Pneumonia due to COVID-19 virus 10/31/2020  . Mixed diabetic hyperlipidemia associated with type 2 diabetes mellitus ([place]) 10/31/2020  . Nicotine dependence,  cigarettes, uncomplicated 84/13/2440  . Acute respiratory failure with hypoxia ([place]) 10/30/2020  . Rash 10/20/2020  . Bilateral leg weakness 10/20/2020  . Cardiac syncope 10/07/2020  . Complete heart block ([place])   . Musculoskeletal chest pain 08/18/2020  . Atherosclerosis 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 ([place]) 06/06/2020  . Atrial fibrillation, chronic ([place]) 05/27/2020  . Blue toes 05/27/2020  . Insomnia 11/23/2019  . Anxiety and depression 07/17/2019  . Erectile dysfunction 08/29/2018  . Elevated alkaline phosphatase level 08/02/2017  . Fatigue 05/02/2017  . Dyslipidemia 11/01/2016  . Right leg claudication ([place]) 06/20/2015  . Chronic kidney disease, stage 3a ([place])   . Type 2 diabetes mellitus with hyperglycemia, without long-term current use of insulin ([place]) 09/12/2013  . Osteoarthritis of right knee 06/07/2013  . BPH (benign prostatic hyperplasia) 02/02/2010  . Essential hypertension, benign 02/05/2008  . COPD (chronic obstructive pulmonary disease) ([place]) 02/05/2008  . Paget's disease of bone 02/05/2008    Past Surgical History:  Procedure Laterality Date  . APPENDECTOMY    . CATARACT EXTRACTION W/PHACO Left 12/11/2019   Procedure: CATARACT EXTRACTION PHACO AND INTRAOCULAR LENS PLACEMENT ([place]) LEFT DIABETIC MALYUGIN;  Surgeon: Birder Robson, MD;  Location: [place];  Service: Ophthalmology;  Laterality: Left;  CDE 11.45 U/S  1:05.7  . CATARACT EXTRACTION W/PHACO Right 01/08/2020   Procedure: CATARACT EXTRACTION PHACO AND INTRAOCULAR LENS PLACEMENT (IOC) RIGHT DIABETIC 5.75  00:49.3;  Surgeon: Birder Robson, MD;  Location: [place];  Service: Ophthalmology;  Laterality: Right;  Diabetic  . laparscopic chole    . PACEMAKER IMPLANT N/A 10/08/2020   Procedure: PACEMAKER IMPLANT;  Surgeon: Vickie Epley, MD;  Location: [place] [facility];  Service: Cardiovascular;  Laterality: N/A;  . SPINE SURGERY     cervical spine surgery  .  trigeminal neuralgia     with surgery in [place]    Prior to Admission medications   Medication Sig Start Date End Date Taking? Authorizing Provider  amiodarone (PACERONE) 200 MG tablet Take 1 tablet by mouth two times a day for one week, then take 1 tablet by mouth once daily. Patient taking differently: Take 200 mg by mouth daily. 06/30/20   Kate Sable, MD  amLODipine (NORVASC) 5 MG tablet Take 1 tablet (5 mg total) by mouth daily. 11/05/20 02/03/21  Terrilee Croak, MD  apixaban (ELIQUIS) 5 MG TABS tablet Take 1 tablet (5 mg total) by mouth 2 (two) times daily. 09/10/20   Leone Haven, MD  blood glucose meter kit and supplies KIT Dispense based on patient and insurance preference. Use up to four times daily as directed. (FOR ICD-9 250.00, 250.01). 09/16/20   Leone Haven, MD  glipiZIDE (GLUCOTROL) 5 MG tablet Take 0.5 tablets (2.5 mg total) by mouth 2 (two) times daily. 11/04/20 02/02/21  Dahal, Marlowe Aschoff, MD  guaiFENesin-dextromethorphan (ROBITUSSIN DM) 100-10 MG/5ML syrup Take 10 mLs by mouth every 4 (four) hours as needed for cough. 11/04/20   Terrilee Croak, MD  linagliptin (TRADJENTA) 5 MG TABS tablet Take 5 mg by mouth daily.    [provider]  lisinopril (ZESTRIL) 20 MG tablet Take 1 tablet (20 mg total) by mouth daily. 09/10/20   Leone Haven, MD  metFORMIN (GLUCOPHAGE XR) 500 MG 24 hr tablet Take 1 tablet (500 mg total) by mouth in the morning and at bedtime. 09/10/20   Leone Haven, MD  ONE TOUCH ULTRA TEST test strip USE TEST STRIP TO CHECK GLUCOSE AT LEAST ONCE DAILY 05/06/16   Thersa Salt G, DO  rosuvastatin (CRESTOR) 20 MG tablet Take 1 tablet by mouth once daily Patient taking differently: Take 20 mg by mouth daily. 08/05/20   Leone Haven, MD  tamsulosin (FLOMAX) 0.4 MG CAPS capsule Take 2 capsules by mouth once daily Patient taking differently: Take 0.4 mg by mouth 2 (two) times daily. 12/05/19   Leone Haven, MD  triamcinolone  (KENALOG) 0.1 % Apply 1 application topically 2 (two) times daily. 10/20/20   Leone Haven, MD  vitamin B-12 (CYANOCOBALAMIN) 500 MCG tablet Take 500 mcg by mouth daily.    [provider]    Allergies  Allergen Reactions  . Other Rash    After getting pacemaker. The medication applied to skin caused a bad rash     Family History  Problem Relation Age of Onset  . Cancer Mother        died @ 52.  [name] COPD Father        died @ 64.  . Diabetes Brother        died in early 23's.    Social History Social History   Tobacco Use  . Smoking status: Current Every Day Smoker    Packs/day: 1.50    Years: 60.00    Pack years: 90.00    Types: Cigarettes  . Smokeless tobacco: Former Network engineer Use Topics  . Alcohol use: Not  Currently    Alcohol/week: 0.0 standard drinks    Comment: rare - "1% of the time."  . Drug use: No    Review of Systems Constitutional: Negative for fever. Cardiovascular: Negative for chest pain. Respiratory: Negative for shortness of breath. Gastrointestinal: Negative for abdominal pain Musculoskeletal: Negative for musculoskeletal complaints Neurological: Negative for headache All other ROS negative  ____________________________________________   PHYSICAL EXAM:  VITAL SIGNS: ED Triage Vitals  Enc Vitals Group     BP 11/24/20 1557 (!) 103/54     Pulse Rate 11/24/20 1557 70     Resp 11/24/20 1557 19     Temp 11/24/20 1557 98 F (36.7 C)     Temp src --      SpO2 11/24/20 1557 97 %     Weight --      Height --      Head Circumference --      Peak Flow --      Pain Score 11/24/20 1601 0     Pain Loc --      Pain Edu? --      Excl. in [place]? --    Constitutional: Alert and oriented. Well appearing and in no distress. Eyes: Normal exam ENT      Head: Normocephalic and atraumatic.      Mouth/Throat: Mucous membranes are moist. Cardiovascular: Normal rate, regular rhythm.  Respiratory: Normal respiratory effort without  tachypnea nor retractions. Breath sounds are clear  Gastrointestinal: Soft and nontender. No distention.   Musculoskeletal: 2+ lower extremity edema up past the knee.  Moderate tenderness throughout lower extremities.  No weepage. Neurologic:  Normal speech and language. No gross focal neurologic deficits  Skin: Patient has a erythematous and desquamating rash over the lower extremities as well as lower abdomen.  Tenderness over the lower extremities however suspect this is more due to the edema, nontender rash over the abdomen.  States it is improving. Psychiatric: Mood and affect are normal.  ____________________________________________    EKG  EKG viewed and interpreted by myself shows what appears to be a sinus rhythm at 68 bpm with a narrow QRS, normal axis, normal intervals besides slight PR prolongation, nonspecific ST changes.  ____________________________________________   INITIAL IMPRESSION / ASSESSMENT AND PLAN / ED COURSE  Pertinent labs & imaging results that were available during my care of the patient were reviewed by me and considered in my medical decision making (see chart for details).   Patient presents emergency department with concern of hypotension and increasing swelling over lower extremities.  Patient does have 2+ lower extremity edema which they state is new since his discharge from the hospital 11/04/2020 after admission for Covid.  Patient's rash appears to be resolving per family but still present with unknown cause.  Troponin was added on is negative.  Patient continues to appear well with reassuring vitals and blood pressure.  Patient is wishing to be discharged home, has already put his clothes on.  We will discharge patient with Lasix.  States his daughter is already made him a dermatology appointment for the rash.  I discussed return precautions.  Patient agreeable to plan of care.  Zachary Santos was evaluated in Emergency Department on 11/24/2020 for the  symptoms described in the history of present illness. He was evaluated in the context of the global COVID-19 pandemic, which necessitated consideration that the patient might be at risk for infection with the SARS-CoV-2 virus that causes COVID-19. Institutional protocols and algorithms that pertain to the  evaluation of patients at risk for COVID-19 are in a state of rapid change based on information released by regulatory bodies including the CDC and federal and state organizations. These policies and algorithms were followed during the patient's care in the ED.  ____________________________________________   FINAL CLINICAL IMPRESSION(S) / ED DIAGNOSES  Peripheral edema Rash   Harvest Dark, MD 11/24/20 2027

## 2020-11-24 NOTE — Telephone Encounter (Signed)
Patient chart is reflecting patient is at ED.

## 2020-11-24 NOTE — Telephone Encounter (Signed)
Spoken to [facility] with patient, Patient is having 10/10 pain when getting up first thing in the morning in his lower legs. They are currently swollen and very red and tender to the touch. Patient currently have 80/41 BP manually and with patient automatic BP machine it is 91/43. Patient is not having any other sx at this time. Instructed patient to go to ED to be evaluated. Mary informed patient and they are going to call his daughter to take him to ED.

## 2020-11-24 NOTE — Telephone Encounter (Signed)
Agree with need for ED evaluation. Please follow-up with them this afternoon to make sure he went to be evaluated.

## 2020-11-25 ENCOUNTER — Telehealth: Payer: Self-pay

## 2020-11-25 NOTE — Telephone Encounter (Signed)
Pt's daughter Jamey Ripa called and states that the medication linagliptin (TRADJENTA) 5 MG TABS tablet is $500 and would like something more affordable called in. She would also like a telephone call when that is done. [phone]

## 2020-11-25 NOTE — Telephone Encounter (Signed)
Pt's daughter Jamey Ripa called and states that the medication linagliptin (TRADJENTA) 5 MG TABS tablet is $500 and would like something more affordable called in. She would also like a telephone call when that is done. [phone].  Shavar Gorka,cma

## 2020-11-25 NOTE — Progress Notes (Signed)
Letter created after 3 attempts to reach the patient and mailed today.  Antonios Ostrow,cma

## 2020-11-25 NOTE — Telephone Encounter (Signed)
I have tried to reach the patient by phone with no success to see how he is doing.  Zachary Santos,cma

## 2020-11-26 ENCOUNTER — Encounter: Payer: Medicare Other | Admitting: Physician Assistant

## 2020-11-26 DIAGNOSIS — E1165 Type 2 diabetes mellitus with hyperglycemia: Secondary | ICD-10-CM | POA: Diagnosis not present

## 2020-11-26 DIAGNOSIS — E785 Hyperlipidemia, unspecified: Secondary | ICD-10-CM | POA: Diagnosis not present

## 2020-11-26 DIAGNOSIS — U071 COVID-19: Secondary | ICD-10-CM | POA: Diagnosis not present

## 2020-11-26 DIAGNOSIS — E1169 Type 2 diabetes mellitus with other specified complication: Secondary | ICD-10-CM | POA: Diagnosis not present

## 2020-11-26 DIAGNOSIS — Z7984 Long term (current) use of oral hypoglycemic drugs: Secondary | ICD-10-CM | POA: Diagnosis not present

## 2020-11-26 DIAGNOSIS — I129 Hypertensive chronic kidney disease with stage 1 through stage 4 chronic kidney disease, or unspecified chronic kidney disease: Secondary | ICD-10-CM | POA: Diagnosis not present

## 2020-11-26 DIAGNOSIS — J449 Chronic obstructive pulmonary disease, unspecified: Secondary | ICD-10-CM | POA: Diagnosis not present

## 2020-11-26 DIAGNOSIS — J1282 Pneumonia due to coronavirus disease 2019: Secondary | ICD-10-CM | POA: Diagnosis not present

## 2020-11-26 DIAGNOSIS — N1831 Chronic kidney disease, stage 3a: Secondary | ICD-10-CM | POA: Diagnosis not present

## 2020-11-26 DIAGNOSIS — M6281 Muscle weakness (generalized): Secondary | ICD-10-CM | POA: Diagnosis not present

## 2020-11-27 DIAGNOSIS — E1165 Type 2 diabetes mellitus with hyperglycemia: Secondary | ICD-10-CM | POA: Diagnosis not present

## 2020-11-27 DIAGNOSIS — N1831 Chronic kidney disease, stage 3a: Secondary | ICD-10-CM | POA: Diagnosis not present

## 2020-11-27 DIAGNOSIS — Z7984 Long term (current) use of oral hypoglycemic drugs: Secondary | ICD-10-CM | POA: Diagnosis not present

## 2020-11-27 DIAGNOSIS — E785 Hyperlipidemia, unspecified: Secondary | ICD-10-CM | POA: Diagnosis not present

## 2020-11-27 DIAGNOSIS — I129 Hypertensive chronic kidney disease with stage 1 through stage 4 chronic kidney disease, or unspecified chronic kidney disease: Secondary | ICD-10-CM | POA: Diagnosis not present

## 2020-11-27 DIAGNOSIS — E1169 Type 2 diabetes mellitus with other specified complication: Secondary | ICD-10-CM | POA: Diagnosis not present

## 2020-11-27 DIAGNOSIS — M6281 Muscle weakness (generalized): Secondary | ICD-10-CM | POA: Diagnosis not present

## 2020-11-27 DIAGNOSIS — U071 COVID-19: Secondary | ICD-10-CM | POA: Diagnosis not present

## 2020-11-27 DIAGNOSIS — J449 Chronic obstructive pulmonary disease, unspecified: Secondary | ICD-10-CM | POA: Diagnosis not present

## 2020-11-27 DIAGNOSIS — J1282 Pneumonia due to coronavirus disease 2019: Secondary | ICD-10-CM | POA: Diagnosis not present

## 2020-11-27 NOTE — Telephone Encounter (Signed)
I called the patient and his daughters number and could not reach anyone to discuss the patient seeing Catie about his medications no answer at all numbers and VM full.  Nina,cma

## 2020-11-27 NOTE — Telephone Encounter (Signed)
Would he be willing to see Catie to see if we could get him assistance for this medication? The issue is that most medications for his diabetes will likely be that expensive.

## 2020-11-27 NOTE — Chronic Care Management (AMB) (Signed)
  Care Management   Note  11/27/2020 Name: Zachary Santos MRN: [id number] DOB: Jun 05, 1936  Zachary Santos is a 85 y.o. year old male who is a primary care patient of Leone Haven, MD and is actively engaged with the care management team. I reached out to Zachary Santos by phone today to assist with re-scheduling a follow up visit with the Pharmacist  Follow up plan: Unsuccessful telephone outreach attempt made. A HIPAA compliant phone message was left for the patient providing contact information and requesting a return call.  The care management team will reach out to the patient again over the next 7 days.  If patient returns call to provider office, please advise to call [place] at [address], [place], [place], [place] [address] Direct Dial: [phone] [email or web address] Website: [email or web address]

## 2020-11-29 LAB — CULTURE, BLOOD (ROUTINE X 2): Culture: NO GROWTH

## 2020-12-01 NOTE — Chronic Care Management (AMB) (Signed)
  Care Management   Note  12/01/2020 Name: VEDH PTACEK MRN: [id number] DOB: 12/18/1935  Zachary Santos is a 85 y.o. year old male who is a primary care patient of Leone Haven, MD and is actively engaged with the care management team. I reached out to Zachary Santos by phone today to assist with re-scheduling a follow up visit with the Pharmacist  Follow up plan: Unsuccessful telephone outreach attempt made. A HIPAA compliant phone message was left for the patient providing contact information and requesting a return call.  Unable to make contact on outreach attempts x 3. PCP Dr. Caryl Bis  notified via routed documentation in medical record.   Noreene Larsson, [address], [place], [place] [address] Direct Dial: [phone] [email or web address] Website: [email or web address]

## 2020-12-01 NOTE — Telephone Encounter (Signed)
Third unsuccessful outreach  

## 2020-12-05 DIAGNOSIS — J449 Chronic obstructive pulmonary disease, unspecified: Secondary | ICD-10-CM | POA: Diagnosis not present

## 2020-12-05 DIAGNOSIS — E785 Hyperlipidemia, unspecified: Secondary | ICD-10-CM | POA: Diagnosis not present

## 2020-12-05 DIAGNOSIS — Z7984 Long term (current) use of oral hypoglycemic drugs: Secondary | ICD-10-CM | POA: Diagnosis not present

## 2020-12-05 DIAGNOSIS — M6281 Muscle weakness (generalized): Secondary | ICD-10-CM | POA: Diagnosis not present

## 2020-12-05 DIAGNOSIS — N1831 Chronic kidney disease, stage 3a: Secondary | ICD-10-CM | POA: Diagnosis not present

## 2020-12-05 DIAGNOSIS — E1169 Type 2 diabetes mellitus with other specified complication: Secondary | ICD-10-CM | POA: Diagnosis not present

## 2020-12-05 DIAGNOSIS — U071 COVID-19: Secondary | ICD-10-CM | POA: Diagnosis not present

## 2020-12-05 DIAGNOSIS — E1165 Type 2 diabetes mellitus with hyperglycemia: Secondary | ICD-10-CM | POA: Diagnosis not present

## 2020-12-05 DIAGNOSIS — I129 Hypertensive chronic kidney disease with stage 1 through stage 4 chronic kidney disease, or unspecified chronic kidney disease: Secondary | ICD-10-CM | POA: Diagnosis not present

## 2020-12-05 DIAGNOSIS — J1282 Pneumonia due to coronavirus disease 2019: Secondary | ICD-10-CM | POA: Diagnosis not present

## 2020-12-06 DIAGNOSIS — E785 Hyperlipidemia, unspecified: Secondary | ICD-10-CM | POA: Diagnosis not present

## 2020-12-06 DIAGNOSIS — E1165 Type 2 diabetes mellitus with hyperglycemia: Secondary | ICD-10-CM | POA: Diagnosis not present

## 2020-12-06 DIAGNOSIS — N1831 Chronic kidney disease, stage 3a: Secondary | ICD-10-CM | POA: Diagnosis not present

## 2020-12-06 DIAGNOSIS — Z7984 Long term (current) use of oral hypoglycemic drugs: Secondary | ICD-10-CM | POA: Diagnosis not present

## 2020-12-06 DIAGNOSIS — J1282 Pneumonia due to coronavirus disease 2019: Secondary | ICD-10-CM | POA: Diagnosis not present

## 2020-12-06 DIAGNOSIS — M6281 Muscle weakness (generalized): Secondary | ICD-10-CM | POA: Diagnosis not present

## 2020-12-06 DIAGNOSIS — U071 COVID-19: Secondary | ICD-10-CM | POA: Diagnosis not present

## 2020-12-06 DIAGNOSIS — J449 Chronic obstructive pulmonary disease, unspecified: Secondary | ICD-10-CM | POA: Diagnosis not present

## 2020-12-06 DIAGNOSIS — E1169 Type 2 diabetes mellitus with other specified complication: Secondary | ICD-10-CM | POA: Diagnosis not present

## 2020-12-06 DIAGNOSIS — I129 Hypertensive chronic kidney disease with stage 1 through stage 4 chronic kidney disease, or unspecified chronic kidney disease: Secondary | ICD-10-CM | POA: Diagnosis not present

## 2020-12-08 DIAGNOSIS — M6281 Muscle weakness (generalized): Secondary | ICD-10-CM | POA: Diagnosis not present

## 2020-12-08 DIAGNOSIS — J1282 Pneumonia due to coronavirus disease 2019: Secondary | ICD-10-CM | POA: Diagnosis not present

## 2020-12-08 DIAGNOSIS — Z7984 Long term (current) use of oral hypoglycemic drugs: Secondary | ICD-10-CM | POA: Diagnosis not present

## 2020-12-08 DIAGNOSIS — I129 Hypertensive chronic kidney disease with stage 1 through stage 4 chronic kidney disease, or unspecified chronic kidney disease: Secondary | ICD-10-CM | POA: Diagnosis not present

## 2020-12-08 DIAGNOSIS — U071 COVID-19: Secondary | ICD-10-CM | POA: Diagnosis not present

## 2020-12-08 DIAGNOSIS — E1169 Type 2 diabetes mellitus with other specified complication: Secondary | ICD-10-CM | POA: Diagnosis not present

## 2020-12-08 DIAGNOSIS — N1831 Chronic kidney disease, stage 3a: Secondary | ICD-10-CM | POA: Diagnosis not present

## 2020-12-08 DIAGNOSIS — E1165 Type 2 diabetes mellitus with hyperglycemia: Secondary | ICD-10-CM | POA: Diagnosis not present

## 2020-12-08 DIAGNOSIS — J449 Chronic obstructive pulmonary disease, unspecified: Secondary | ICD-10-CM | POA: Diagnosis not present

## 2020-12-08 DIAGNOSIS — E785 Hyperlipidemia, unspecified: Secondary | ICD-10-CM | POA: Diagnosis not present

## 2020-12-10 NOTE — Telephone Encounter (Signed)
Could not reach patient by any number listed and the daughter's number in the message is a wrong number.  Zachary Santos,cma

## 2020-12-11 DIAGNOSIS — E785 Hyperlipidemia, unspecified: Secondary | ICD-10-CM | POA: Diagnosis not present

## 2020-12-11 DIAGNOSIS — E1165 Type 2 diabetes mellitus with hyperglycemia: Secondary | ICD-10-CM | POA: Diagnosis not present

## 2020-12-11 DIAGNOSIS — J449 Chronic obstructive pulmonary disease, unspecified: Secondary | ICD-10-CM | POA: Diagnosis not present

## 2020-12-11 DIAGNOSIS — M6281 Muscle weakness (generalized): Secondary | ICD-10-CM | POA: Diagnosis not present

## 2020-12-11 DIAGNOSIS — U071 COVID-19: Secondary | ICD-10-CM | POA: Diagnosis not present

## 2020-12-11 DIAGNOSIS — J1282 Pneumonia due to coronavirus disease 2019: Secondary | ICD-10-CM | POA: Diagnosis not present

## 2020-12-11 DIAGNOSIS — I129 Hypertensive chronic kidney disease with stage 1 through stage 4 chronic kidney disease, or unspecified chronic kidney disease: Secondary | ICD-10-CM | POA: Diagnosis not present

## 2020-12-11 DIAGNOSIS — N1831 Chronic kidney disease, stage 3a: Secondary | ICD-10-CM | POA: Diagnosis not present

## 2020-12-11 DIAGNOSIS — E1169 Type 2 diabetes mellitus with other specified complication: Secondary | ICD-10-CM | POA: Diagnosis not present

## 2020-12-11 DIAGNOSIS — Z7984 Long term (current) use of oral hypoglycemic drugs: Secondary | ICD-10-CM | POA: Diagnosis not present

## 2020-12-28 ENCOUNTER — Other Ambulatory Visit: Payer: Self-pay | Admitting: Family Medicine

## 2020-12-28 DIAGNOSIS — E785 Hyperlipidemia, unspecified: Secondary | ICD-10-CM

## 2021-01-05 ENCOUNTER — Other Ambulatory Visit: Payer: Self-pay | Admitting: Family Medicine

## 2021-01-07 ENCOUNTER — Ambulatory Visit (INDEPENDENT_AMBULATORY_CARE_PROVIDER_SITE_OTHER): Payer: Medicare Other

## 2021-01-07 DIAGNOSIS — I442 Atrioventricular block, complete: Secondary | ICD-10-CM

## 2021-01-07 LAB — CUP PACEART REMOTE DEVICE CHECK
Battery Remaining Longevity: 65 mo
Battery Remaining Percentage: 95.5 %
Battery Voltage: 2.99 V
Brady Statistic AP VP Percent: 58 %
Brady Statistic AP VS Percent: 1 %
Brady Statistic AS VP Percent: 42 %
Brady Statistic AS VS Percent: 1 %
Brady Statistic RA Percent Paced: 58 %
Brady Statistic RV Percent Paced: 99 %
Date Time Interrogation Session: 20220323020015
Implantable Lead Implant Date: 20211222
Implantable Lead Implant Date: 20211222
Implantable Lead Location: 753859
Implantable Lead Location: 753860
Implantable Pulse Generator Implant Date: 20211222
Lead Channel Impedance Value: 440 Ohm
Lead Channel Impedance Value: 660 Ohm
Lead Channel Pacing Threshold Amplitude: 0.5 V
Lead Channel Pacing Threshold Amplitude: 0.75 V
Lead Channel Pacing Threshold Pulse Width: 0.5 ms
Lead Channel Pacing Threshold Pulse Width: 0.5 ms
Lead Channel Sensing Intrinsic Amplitude: 11.2 mV
Lead Channel Sensing Intrinsic Amplitude: 2.2 mV
Lead Channel Setting Pacing Amplitude: 3.5 V
Lead Channel Setting Pacing Amplitude: 3.5 V
Lead Channel Setting Pacing Pulse Width: 0.5 ms
Lead Channel Setting Sensing Sensitivity: 2 mV
Pulse Gen Model: 2272
Pulse Gen Serial Number: 3874736

## 2021-01-12 ENCOUNTER — Other Ambulatory Visit: Payer: Self-pay | Admitting: Family Medicine

## 2021-01-14 ENCOUNTER — Ambulatory Visit: Payer: Medicare Other | Admitting: Cardiology

## 2021-01-14 NOTE — Progress Notes (Unsigned)
Electrophysiology Office Follow up Visit Note:    Date:  01/14/2021   ID:  PAIGE VANDERWOUDE, DOB 12-10-1935, MRN [id number]  PCP:  Leone Haven, MD  [facility] Cardiologist:  Kate Sable, MD  [facility] Electrophysiologist:  Vickie Epley, MD    Interval History:    Zachary Santos is a 85 y.o. male who presents for a follow up visit after permanent pacemaker implantation on October 08, 2020.  Since that time he has done well.  Remote device interrogations have shown normal device function.    Past Medical History:  Diagnosis Date  . Anxiety   . BPH (benign prostatic hyperplasia)   . Chronic fatigue   . CKD (chronic kidney disease), stage III ([place])   . Claudication [facility])    a. R Hip.  [name] COPD (chronic obstructive pulmonary disease) ([place])   . Dyspnea    with exertion  . Essential hypertension    a. 01/2008 Echo: EF 65%, no rwma, mod increased PASP.  [name] Hypercholesterolemia   . Osteoarthritis   . Paget's disease   . Syncope    a. 03/2015 in setting of hypotension.  . Tobacco abuse    a. 1-1.5 ppd x 60+ yrs.  . Type II diabetes mellitus ([place])   . Wears partial dentures    uppers    Past Surgical History:  Procedure Laterality Date  . APPENDECTOMY    . CATARACT EXTRACTION W/PHACO Left 12/11/2019   Procedure: CATARACT EXTRACTION PHACO AND INTRAOCULAR LENS PLACEMENT ([place]) LEFT DIABETIC MALYUGIN;  Surgeon: Birder Robson, MD;  Location: [place];  Service: Ophthalmology;  Laterality: Left;  CDE 11.45 U/S  1:05.7  . CATARACT EXTRACTION W/PHACO Right 01/08/2020   Procedure: CATARACT EXTRACTION PHACO AND INTRAOCULAR LENS PLACEMENT ([facility]) RIGHT DIABETIC 5.75  00:49.3;  Surgeon: Birder Robson, MD;  Location: [place];  Service: Ophthalmology;  Laterality: Right;  Diabetic  . laparscopic chole    . PACEMAKER IMPLANT N/A 10/08/2020   Procedure: PACEMAKER IMPLANT;  Surgeon: Vickie Epley, MD;  Location: [place] [facility];  Service:  Cardiovascular;  Laterality: N/A;  . SPINE SURGERY     cervical spine surgery  . trigeminal neuralgia     with surgery in [place]    Current Medications: No outpatient medications have been marked as taking for the 01/14/21 encounter (Appointment) with Vickie Epley, MD.     Allergies:   Other   Social History   Socioeconomic History  . Marital status: Married    Spouse name: Not on file  . Number of children: Not on file  . Years of education: Not on file  . Highest education level: Not on file  Occupational History  . Not on file  Tobacco Use  . Smoking status: Current Every Day Smoker    Packs/day: 1.50    Years: 60.00    Pack years: 90.00    Types: Cigarettes  . Smokeless tobacco: Former Network engineer and Sexual Activity  . Alcohol use: Not Currently    Alcohol/week: 0.0 standard drinks    Comment: rare - "1% of the time."  . Drug use: No  . Sexual activity: Not on file  Other Topics Concern  . Not on file  Social History Narrative   Lives in [place] with wife.  Retired Administrator.  Very sedentary.  Avoids activity b/c he "gives out" and is limited by right hip/buttock discomfort/claudication.   Social Determinants of Health   Financial  Resource Strain: Medium Risk  . Difficulty of Paying Living Expenses: Somewhat hard  Food Insecurity: No Food Insecurity  . Worried About Charity fundraiser in the Last Year: Never true  . Ran Out of Food in the Last Year: Never true  Transportation Needs: No Transportation Needs  . Lack of Transportation (Medical): No  . Lack of Transportation (Non-Medical): No  Physical Activity: Not on file  Stress: No Stress Concern Present  . Feeling of Stress : Only a little  Social Connections: Unknown  . Frequency of Communication with Friends and Family: More than three times a week  . Frequency of Social Gatherings with Friends and Family: Once a week  . Attends Religious Services: Not on file  . Active Member of  Clubs or Organizations: Not on file  . Attends Archivist Meetings: Not on file  . Marital Status: Married     Family History: The patient's family history includes COPD in his father; Cancer in his mother; Diabetes in his brother.  ROS:   Please see the history of present illness.    All other systems reviewed and are negative.  EKGs/Labs/Other Studies Reviewed:    The following studies were reviewed today:  January 14, 2021 device interrogation personally reviewed ***  EKG:  The ekg ordered today demonstrates ***  Recent Labs: 10/07/2020: TSH 1.501 10/31/2020: B Natriuretic Peptide 206.3 11/04/2020: ALT 83; Magnesium 2.2 11/24/2020: BUN 16; Creatinine, Ser 1.43; Hemoglobin 9.4; Platelets 122; Potassium 4.4; Sodium 141  Recent Lipid Panel    Component Value Date/Time   CHOL 131 05/27/2020 1324   TRIG 153.0 (H) 05/27/2020 1324   HDL 24.60 (L) 05/27/2020 1324   CHOLHDL 5 05/27/2020 1324   VLDL 30.6 05/27/2020 1324   LDLCALC 76 05/27/2020 1324    Physical Exam:    VS:  There were no vitals taken for this visit.    Wt Readings from Last 3 Encounters:  11/03/20 163 lb 2.3 oz (74 kg)  10/20/20 173 lb (78.5 kg)  10/08/20 181 lb 6.4 oz (82.3 kg)     GEN: *** Well nourished, well developed in no acute distress HEENT: Normal NECK: No JVD; No carotid bruits LYMPHATICS: No lymphadenopathy CARDIAC: ***RRR, no murmurs, rubs, gallops RESPIRATORY:  Clear to auscultation without rales, wheezing or rhonchi  ABDOMEN: Soft, non-tender, non-distended MUSCULOSKELETAL:  No edema; No deformity  SKIN: Warm and dry NEUROLOGIC:  Alert and oriented x 3 PSYCHIATRIC:  Normal affect   ASSESSMENT:    No diagnosis found. PLAN:    In order of problems listed above:  1. Complete heart block post permanent pacemaker implant Interrogation shows good function of the pacemaker.   Total time spent with patient today *** minutes. This includes reviewing records, evaluating the  patient and coordinating care.   Medication Adjustments/Labs and Tests Ordered: Current medicines are reviewed at length with the patient today.  Concerns regarding medicines are outlined above.  No orders of the defined types were placed in this encounter.  No orders of the defined types were placed in this encounter.    Signed, Lars Mage, MD, [facility]  01/14/2021 12:58 PM    Electrophysiology [facility]

## 2021-01-15 ENCOUNTER — Encounter: Payer: Self-pay | Admitting: Cardiology

## 2021-01-15 NOTE — Progress Notes (Signed)
Remote pacemaker transmission.   

## 2021-01-26 ENCOUNTER — Telehealth: Payer: Self-pay

## 2021-01-26 ENCOUNTER — Ambulatory Visit: Payer: Medicare Other

## 2021-01-26 NOTE — Telephone Encounter (Signed)
No show for in office visit. Unable to reach patient via telephone. No answer. Reschedule as appropriate.

## 2021-01-28 ENCOUNTER — Ambulatory Visit: Payer: Medicare Other | Admitting: Family Medicine

## 2021-01-28 DIAGNOSIS — Z0289 Encounter for other administrative examinations: Secondary | ICD-10-CM

## 2021-02-06 ENCOUNTER — Telehealth: Payer: Self-pay | Admitting: Family Medicine

## 2021-02-06 ENCOUNTER — Other Ambulatory Visit: Payer: Self-pay

## 2021-02-06 DIAGNOSIS — I480 Paroxysmal atrial fibrillation: Secondary | ICD-10-CM

## 2021-02-06 MED ORDER — LINAGLIPTIN 5 MG PO TABS: 5.0000 mg | ORAL_TABLET | Freq: Every day | ORAL | 0 refills | Status: DC

## 2021-02-06 MED ORDER — APIXABAN 5 MG PO TABS: 5.0000 mg | ORAL_TABLET | Freq: Two times a day (BID) | ORAL | 3 refills | Status: DC

## 2021-02-06 NOTE — Telephone Encounter (Signed)
Pt needs refills on TRADJENTA 5 MG TABS tablet and apixaban (ELIQUIS) 5 MG TABS tablet sent to [facility]

## 2021-02-06 NOTE — Telephone Encounter (Signed)
LM that both prescriptions had been sent to [facility].

## 2021-02-20 ENCOUNTER — Other Ambulatory Visit: Payer: Self-pay | Admitting: Family Medicine

## 2021-02-23 ENCOUNTER — Other Ambulatory Visit: Payer: Self-pay

## 2021-02-23 MED ORDER — AMIODARONE HCL 200 MG PO TABS: 200.0000 mg | ORAL_TABLET | Freq: Every day | ORAL | 3 refills | Status: AC

## 2021-03-25 ENCOUNTER — Other Ambulatory Visit: Payer: Self-pay | Admitting: Family Medicine

## 2021-04-02 ENCOUNTER — Other Ambulatory Visit: Payer: Self-pay | Admitting: Family Medicine

## 2021-04-02 DIAGNOSIS — E785 Hyperlipidemia, unspecified: Secondary | ICD-10-CM

## 2021-04-08 ENCOUNTER — Ambulatory Visit (INDEPENDENT_AMBULATORY_CARE_PROVIDER_SITE_OTHER): Payer: Medicare Other

## 2021-04-08 DIAGNOSIS — I442 Atrioventricular block, complete: Secondary | ICD-10-CM

## 2021-04-08 LAB — CUP PACEART REMOTE DEVICE CHECK
Battery Remaining Longevity: 62 mo
Battery Remaining Percentage: 94 %
Battery Voltage: 2.99 V
Brady Statistic AP VP Percent: 62 %
Brady Statistic AP VS Percent: 1 %
Brady Statistic AS VP Percent: 38 %
Brady Statistic AS VS Percent: 1 %
Brady Statistic RA Percent Paced: 61 %
Brady Statistic RV Percent Paced: 99 %
Date Time Interrogation Session: 20220622020012
Implantable Lead Implant Date: 20211222
Implantable Lead Implant Date: 20211222
Implantable Lead Location: 753859
Implantable Lead Location: 753860
Implantable Pulse Generator Implant Date: 20211222
Lead Channel Impedance Value: 480 Ohm
Lead Channel Impedance Value: 700 Ohm
Lead Channel Pacing Threshold Amplitude: 0.5 V
Lead Channel Pacing Threshold Amplitude: 0.75 V
Lead Channel Pacing Threshold Pulse Width: 0.5 ms
Lead Channel Pacing Threshold Pulse Width: 0.5 ms
Lead Channel Sensing Intrinsic Amplitude: 4.5 mV
Lead Channel Sensing Intrinsic Amplitude: 7.9 mV
Lead Channel Setting Pacing Amplitude: 3.5 V
Lead Channel Setting Pacing Amplitude: 3.5 V
Lead Channel Setting Pacing Pulse Width: 0.5 ms
Lead Channel Setting Sensing Sensitivity: 2 mV
Pulse Gen Model: 2272
Pulse Gen Serial Number: 3874736

## 2021-04-17 ENCOUNTER — Encounter: Payer: Self-pay | Admitting: Family Medicine

## 2021-04-17 ENCOUNTER — Ambulatory Visit (INDEPENDENT_AMBULATORY_CARE_PROVIDER_SITE_OTHER): Payer: Medicare Other | Admitting: Family Medicine

## 2021-04-17 ENCOUNTER — Other Ambulatory Visit: Payer: Self-pay

## 2021-04-17 DIAGNOSIS — R0600 Dyspnea, unspecified: Secondary | ICD-10-CM | POA: Diagnosis not present

## 2021-04-17 DIAGNOSIS — R634 Abnormal weight loss: Secondary | ICD-10-CM

## 2021-04-17 DIAGNOSIS — H409 Unspecified glaucoma: Secondary | ICD-10-CM

## 2021-04-17 DIAGNOSIS — F17210 Nicotine dependence, cigarettes, uncomplicated: Secondary | ICD-10-CM

## 2021-04-17 DIAGNOSIS — E785 Hyperlipidemia, unspecified: Secondary | ICD-10-CM

## 2021-04-17 DIAGNOSIS — J3489 Other specified disorders of nose and nasal sinuses: Secondary | ICD-10-CM | POA: Diagnosis not present

## 2021-04-17 DIAGNOSIS — G2581 Restless legs syndrome: Secondary | ICD-10-CM | POA: Diagnosis not present

## 2021-04-17 DIAGNOSIS — E1165 Type 2 diabetes mellitus with hyperglycemia: Secondary | ICD-10-CM | POA: Diagnosis not present

## 2021-04-17 DIAGNOSIS — I10 Essential (primary) hypertension: Secondary | ICD-10-CM | POA: Diagnosis not present

## 2021-04-17 DIAGNOSIS — E1159 Type 2 diabetes mellitus with other circulatory complications: Secondary | ICD-10-CM | POA: Diagnosis not present

## 2021-04-17 MED ORDER — ROSUVASTATIN CALCIUM 20 MG PO TABS: 20.0000 mg | ORAL_TABLET | Freq: Every day | ORAL | 3 refills | Status: AC

## 2021-04-17 MED ORDER — METFORMIN HCL ER 500 MG PO TB24
500.0000 mg | ORAL_TABLET | Freq: Two times a day (BID) | ORAL | 1 refills | Status: AC
Start: 2021-04-17 — End: ?

## 2021-04-17 MED ORDER — LISINOPRIL 20 MG PO TABS
20.0000 mg | ORAL_TABLET | Freq: Every day | ORAL | 3 refills | Status: DC
Start: 2021-04-17 — End: 2022-09-15

## 2021-04-17 MED ORDER — LINAGLIPTIN 5 MG PO TABS: 5.0000 mg | ORAL_TABLET | Freq: Every day | ORAL | 2 refills | Status: DC

## 2021-04-17 MED ORDER — AZELASTINE HCL 0.1 % NA SOLN
2.0000 | Freq: Two times a day (BID) | NASAL | 12 refills | Status: AC
Start: 2021-04-17 — End: ?

## 2021-04-17 MED ORDER — AMLODIPINE BESYLATE 5 MG PO TABS: 5.0000 mg | ORAL_TABLET | Freq: Every day | ORAL | 2 refills | Status: DC

## 2021-04-17 NOTE — Assessment & Plan Note (Signed)
Undetermined control.  We will continue metformin 500 mg twice daily and Tradjenta.  Check A1c.  I encouraged him to see his eye doctor.

## 2021-04-17 NOTE — Progress Notes (Signed)
Zachary Rumps, MD Phone: [phone]  Zachary Santos is a 85 y.o. male who presents today for f/u.  HYPERTENSION Disease Monitoring: Blood pressure range-147/63 is the highest Chest pain- no      Dyspnea- no Medications: Compliance- taking amlodipine, lisinopril Lightheadedness- no   Edema- rare He does report some PND though no orthopnea.  DIABETES Disease Monitoring: Blood Sugar ranges-not checking Polyuria/phagia/dipsia- no      Optho- due Medications: Compliance- taking metformin, tradjenta Hypoglycemic symptoms- no  Glaucoma: Patient reports he used to be on eyedrops though has not seen the eye doctor in some time.  Rhinorrhea: This has been going on at least 3 weeks.  His nose has clear drainage.  He is able to blow his nose and that is clear.  Restless leg syndrome: Patient found out this was related to an itch from internal dermatitis causing him to jerk his legs.  Weight loss: Patient notes he used to weigh in the 190s before he got sick with COVID last year.  He has lost weight since then.  He has not had a sense of taste until just recently.  Has not been as hungry related to this.  No diarrhea, vomiting, blood in stool, or abdominal pain.   Social History   Tobacco Use  Smoking Status Every Day   Packs/day: 1.50   Years: 60.00   Pack years: 90.00   Types: Cigarettes  Smokeless Tobacco Former    Current Outpatient Medications on File Prior to Visit  Medication Sig Dispense Refill   amiodarone (PACERONE) 200 MG tablet Take 1 tablet (200 mg total) by mouth daily. 30 tablet 3   apixaban (ELIQUIS) 5 MG TABS tablet Take 1 tablet (5 mg total) by mouth 2 (two) times daily. 180 tablet 3   blood glucose meter kit and supplies KIT Dispense based on patient and insurance preference. Use up to four times daily as directed. (FOR ICD-9 250.00, 250.01). 1 each 0   ONE TOUCH ULTRA TEST test strip USE TEST STRIP TO CHECK GLUCOSE AT LEAST ONCE DAILY 50 each 11   tamsulosin  (FLOMAX) 0.4 MG CAPS capsule Take 2 capsules by mouth once daily 180 capsule 0   triamcinolone (KENALOG) 0.1 % Apply 1 application topically 2 (two) times daily. 30 g 0   vitamin B-12 (CYANOCOBALAMIN) 500 MCG tablet Take 500 mcg by mouth daily.     No current facility-administered medications on file prior to visit.     ROS see history of present illness  Objective  Physical Exam Vitals:   04/17/21 1422  BP: 124/68  Pulse: 72  Resp: 18  Temp: (!) 97.5 F (36.4 C)  SpO2: 96%    BP Readings from Last 3 Encounters:  04/17/21 124/68  11/24/20 110/60  11/04/20 134/65   Wt Readings from Last 3 Encounters:  04/17/21 172 lb 4 oz (78.1 kg)  11/03/20 163 lb 2.3 oz (74 kg)  10/20/20 173 lb (78.5 kg)    Physical Exam Constitutional:      General: He is not in acute distress.    Appearance: He is not diaphoretic.  Cardiovascular:     Rate and Rhythm: Normal rate and regular rhythm.     Heart sounds: Normal heart sounds.  Pulmonary:     Effort: Pulmonary effort is normal.     Breath sounds: Normal breath sounds.  Chest:  Breasts:    Right: No axillary adenopathy or supraclavicular adenopathy.     Left: No axillary adenopathy or supraclavicular adenopathy.  Abdominal:     General: Bowel sounds are normal. There is no distension.     Palpations: Abdomen is soft.     Tenderness: There is no abdominal tenderness. There is no guarding or rebound.  Lymphadenopathy:     Head:     Right side of head: No submental or submandibular adenopathy.     Left side of head: No submental or submandibular adenopathy.     Cervical: No cervical adenopathy.     Upper Body:     Right upper body: No supraclavicular or axillary adenopathy.     Left upper body: No supraclavicular or axillary adenopathy.  Skin:    General: Skin is warm and dry.  Neurological:     Mental Status: He is alert.     Assessment/Plan: Please see individual problem list.  Problem List Items Addressed This Visit      Dyslipidemia    Check lipid panel.  Continue Crestor.       Relevant Medications   rosuvastatin (CRESTOR) 20 MG tablet   Essential hypertension, benign    Stable.  His low diastolic blood pressures limit the ability to add anything else to his regimen.  He will continue amlodipine 5 mg once daily and lisinopril 20 mg daily.       Relevant Medications   amLODipine (NORVASC) 5 MG tablet   lisinopril (ZESTRIL) 20 MG tablet   rosuvastatin (CRESTOR) 20 MG tablet   Glaucoma    I encouraged him to contact his ophthalmologist for follow-up.  Discussed the importance of seeing his eye doctor regularly.       Nicotine dependence, cigarettes, uncomplicated    The patient is not interested in quitting smoking.  I encouraged cessation.       PND (paroxysmal nocturnal dyspnea)    The patient notes occasionally waking up short of breath at night.  We will check a BNP.  He had an echo late last year with no evidence of left-sided heart failure.       Relevant Orders   B Nat Peptide   Restless leg    Patient reports this is related to a dermatitis.  He is using a topical treatment through dermatology.       Rhinorrhea    Possibly allergic versus vasomotor rhinitis.  We will trial Astelin to see if that is beneficial.       Relevant Medications   azelastine (ASTELIN) 0.1 % nasal spray   Type 2 diabetes mellitus with hyperglycemia, without long-term current use of insulin (HCC)    Undetermined control.  We will continue metformin 500 mg twice daily and Tradjenta.  Check A1c.  I encouraged him to see his eye doctor.       Relevant Medications   linagliptin (TRADJENTA) 5 MG TABS tablet   lisinopril (ZESTRIL) 20 MG tablet   metFORMIN (GLUCOPHAGE XR) 500 MG 24 hr tablet   rosuvastatin (CRESTOR) 20 MG tablet   Weight loss    This seems to have stabilized or improved slightly since his last weight.  I suspect this is related to his prior illness and his lack of taste leading to a  lack of appetite.  We will check additional lab work to evaluate for other causes.       Relevant Orders   TSH   CBC w/Diff   Other Visit Diagnoses     Type 2 diabetes mellitus with other circulatory complication, without long-term current use of insulin ([place])  Relevant Medications   linagliptin (TRADJENTA) 5 MG TABS tablet   lisinopril (ZESTRIL) 20 MG tablet   metFORMIN (GLUCOPHAGE XR) 500 MG 24 hr tablet   rosuvastatin (CRESTOR) 20 MG tablet   Other Relevant Orders   HgB A1c   Hyperlipidemia, unspecified hyperlipidemia type       Relevant Medications   amLODipine (NORVASC) 5 MG tablet   lisinopril (ZESTRIL) 20 MG tablet   rosuvastatin (CRESTOR) 20 MG tablet   Other Relevant Orders   Comp Met (CMET)   Lipid panel        Return in about 3 months (around 07/18/2021).  This visit occurred during the SARS-CoV-2 public health emergency.  Safety protocols were in place, including screening questions prior to the visit, additional usage of staff PPE, and extensive cleaning of exam room while observing appropriate contact time as indicated for disinfecting solutions.    Zachary Rumps, MD [facility]

## 2021-04-17 NOTE — Patient Instructions (Addendum)
Nice to see you. We will get lab work today to evaluate for a number of your symptoms. Please try the Astelin nasal spray for your runny nose. Please call your eye doctor to get set up for follow-up.  It is very important that you see them on a scheduled basis given your history of glaucoma as that can significantly damage your eyes if untreated.

## 2021-04-17 NOTE — Assessment & Plan Note (Signed)
This seems to have stabilized or improved slightly since his last weight.  I suspect this is related to his prior illness and his lack of taste leading to a lack of appetite.  We will check additional lab work to evaluate for other causes.

## 2021-04-17 NOTE — Assessment & Plan Note (Signed)
Check lipid panel.  Continue Crestor.

## 2021-04-17 NOTE — Assessment & Plan Note (Signed)
Stable.  His low diastolic blood pressures limit the ability to add anything else to his regimen.  He will continue amlodipine 5 mg once daily and lisinopril 20 mg daily.

## 2021-04-17 NOTE — Assessment & Plan Note (Signed)
The patient is not interested in quitting smoking.  I encouraged cessation.

## 2021-04-17 NOTE — Assessment & Plan Note (Signed)
Possibly allergic versus vasomotor rhinitis.  We will trial Astelin to see if that is beneficial.

## 2021-04-17 NOTE — Assessment & Plan Note (Signed)
The patient notes occasionally waking up short of breath at night.  We will check a BNP.  He had an echo late last year with no evidence of left-sided heart failure.

## 2021-04-17 NOTE — Assessment & Plan Note (Signed)
Patient reports this is related to a dermatitis.  He is using a topical treatment through dermatology.

## 2021-04-17 NOTE — Assessment & Plan Note (Signed)
I encouraged him to contact his ophthalmologist for follow-up.  Discussed the importance of seeing his eye doctor regularly.

## 2021-04-18 LAB — LIPID PANEL
Cholesterol: 93 mg/dL (ref <200)
HDL: 33 mg/dL — ABNORMAL LOW (ref >=40)
LDL Cholesterol (Calc): 45 mg/dL (calc)
Non-HDL Cholesterol (Calc): 60 mg/dL (calc) (ref <130)
Total CHOL/HDL Ratio: 2.8 (calc) (ref <5.0)
Triglycerides: 69 mg/dL (ref <150)

## 2021-04-18 LAB — CBC WITH DIFFERENTIAL/PLATELET
Absolute Monocytes: 651 cells/uL (ref 200–950)
Basophils Absolute: 22 cells/uL (ref 0–200)
Basophils Relative: 0.3 %
Eosinophils Absolute: 207 cells/uL (ref 15–500)
Eosinophils Relative: 2.8 %
HCT: 41.9 % (ref 38.5–50.0)
Hemoglobin: 13.4 g/dL (ref 13.2–17.1)
Lymphs Abs: 1051 cells/uL (ref 850–3900)
MCH: 30.4 pg (ref 27.0–33.0)
MCHC: 32 g/dL (ref 32.0–36.0)
MCV: 95 fL (ref 80.0–100.0)
MPV: 12.5 fL (ref 7.5–12.5)
Monocytes Relative: 8.8 %
Neutro Abs: 5469 cells/uL (ref 1500–7800)
Neutrophils Relative %: 73.9 %
Platelets: 159 10*3/uL (ref 140–400)
RBC: 4.41 10*6/uL (ref 4.20–5.80)
RDW: 13.1 % (ref 11.0–15.0)
Total Lymphocyte: 14.2 %
WBC: 7.4 10*3/uL (ref 3.8–10.8)

## 2021-04-18 LAB — COMPREHENSIVE METABOLIC PANEL
AG Ratio: 1.9 (calc) (ref 1.0–2.5)
ALT: 18 U/L (ref 9–46)
AST: 15 U/L (ref 10–35)
Albumin: 4.2 g/dL (ref 3.6–5.1)
Alkaline phosphatase (APISO): 82 U/L (ref 35–144)
BUN/Creatinine Ratio: 11 (calc) (ref 6–22)
BUN: 17 mg/dL (ref 7–25)
CO2: 25 mmol/L (ref 20–32)
Calcium: 9.6 mg/dL (ref 8.6–10.3)
Chloride: 106 mmol/L (ref 98–110)
Creat: 1.51 mg/dL — ABNORMAL HIGH (ref 0.70–1.11)
Globulin: 2.2 g/dL (calc) (ref 1.9–3.7)
Glucose, Bld: 161 mg/dL — ABNORMAL HIGH (ref 65–99)
Potassium: 4.5 mmol/L (ref 3.5–5.3)
Sodium: 141 mmol/L (ref 135–146)
Total Bilirubin: 0.4 mg/dL (ref 0.2–1.2)
Total Protein: 6.4 g/dL (ref 6.1–8.1)

## 2021-04-18 LAB — HEMOGLOBIN A1C
Hgb A1c MFr Bld: 7.9 % of total Hgb — ABNORMAL HIGH (ref <5.7)
Mean Plasma Glucose: 180 mg/dL
eAG (mmol/L): 10 mmol/L

## 2021-04-18 LAB — TSH: TSH: 1.96 mIU/L (ref 0.40–4.50)

## 2021-04-18 LAB — BRAIN NATRIURETIC PEPTIDE: Brain Natriuretic Peptide: 52 pg/mL (ref <100)

## 2021-04-21 ENCOUNTER — Telehealth: Payer: Self-pay | Admitting: Family Medicine

## 2021-04-21 NOTE — Telephone Encounter (Signed)
Left message for patient to call back and schedule Medicare Annual Wellness Visit (AWV) in office.   If not able to come in office, please offer to do virtually or by telephone.   Last AWV:01/24/2020  Please schedule at anytime with Nurse Health Advisor.

## 2021-04-28 NOTE — Progress Notes (Signed)
Remote pacemaker transmission.   

## 2021-05-04 ENCOUNTER — Telehealth: Payer: Self-pay

## 2021-05-04 DIAGNOSIS — R0789 Other chest pain: Secondary | ICD-10-CM | POA: Diagnosis not present

## 2021-05-04 DIAGNOSIS — R079 Chest pain, unspecified: Secondary | ICD-10-CM | POA: Diagnosis not present

## 2021-05-04 NOTE — Telephone Encounter (Signed)
Noted.  Please follow-up with the patient to make sure he went to get evaluated.  There is no record of him being in the ED at this time.  Given his complaints he needs to be seen in the emergency department.

## 2021-05-04 NOTE — Telephone Encounter (Signed)
Noted.  Certainly if things continue to bother with the chest pain or if they worsen he needs to be evaluated right away.  I would still recommend evaluation as he has already been advised.  He can also be scheduled for follow-up with me or other provider regarding this issue.  If he is willing to come in to be seen.

## 2021-05-04 NOTE — Telephone Encounter (Signed)
12:45 on Tuesday. Otherwise he could be placed in the 3:15 slot on Friday.

## 2021-05-04 NOTE — Telephone Encounter (Signed)
Spoken to patient, he stated he called EMS, after talking to them they felt he didn't need to go to the ED after checking his BP ands ugar and listening to him, per patient. Patient stated now his chest is kind of sore but he believes it is anxiety and feels that the ED would be a wast of time. No other sx except the soreness. Patient refused to go to ED/UC.

## 2021-05-04 NOTE — Telephone Encounter (Signed)
Patient was instructed to go to ED and call EMS per access nurse.

## 2021-05-04 NOTE — Telephone Encounter (Signed)
Is there a spot you think you could work him in? There are no appointments available this week with any provider.[name]

## 2021-05-04 NOTE — Telephone Encounter (Signed)
Pt called and requested to speak to a triage nurse. Pt has had constant chest pain for about an hour. His bp is 151/61 with a pulse of 61. He states that he has not eaten yet today-just has had a couple cups of coffee. Pt denies SOB, headache, or any other type of pain. Transferred to Baxter Flattery at Access nurse to triage. Advised her that we do not have any appts avail today at the time of call-virtual or in office.

## 2021-05-05 NOTE — Telephone Encounter (Signed)
LMTCB

## 2021-05-06 NOTE — Progress Notes (Signed)
After 3 attempts to reach by phone a letter with results and comments were mailed to the patients home.  Cartier Mapel,cma

## 2021-05-11 ENCOUNTER — Other Ambulatory Visit: Payer: Self-pay | Admitting: Family Medicine

## 2021-05-28 ENCOUNTER — Telehealth: Payer: Self-pay

## 2021-05-28 NOTE — Telephone Encounter (Signed)
Pt states that he has been taking glipiZIDE (GLUCOTROL) 5 MG tablet but it looks like it has been discontinued last month. Please clarify with pt

## 2021-05-29 NOTE — Telephone Encounter (Signed)
At this time I have that he should only be on the Tradjenta and metformin.  Has he been taking glipizide?

## 2021-06-01 NOTE — Telephone Encounter (Signed)
Tried to call patient on all numbers listed

## 2021-06-01 NOTE — Telephone Encounter (Signed)
Unable to leave message

## 2021-06-02 NOTE — Telephone Encounter (Signed)
All lines have busy signal unable to leave message.

## 2021-06-03 NOTE — Telephone Encounter (Signed)
Noted.  Please mail a letter asking him to contact the office regarding this.  Thanks.

## 2021-06-04 NOTE — Telephone Encounter (Signed)
Letter has been printed and mailed to patient.

## 2021-06-16 ENCOUNTER — Other Ambulatory Visit: Payer: Self-pay | Admitting: Family Medicine

## 2021-06-16 ENCOUNTER — Telehealth: Payer: Self-pay | Admitting: Family Medicine

## 2021-06-16 DIAGNOSIS — E785 Hyperlipidemia, unspecified: Secondary | ICD-10-CM

## 2021-06-16 DIAGNOSIS — I10 Essential (primary) hypertension: Secondary | ICD-10-CM

## 2021-06-16 DIAGNOSIS — E1159 Type 2 diabetes mellitus with other circulatory complications: Secondary | ICD-10-CM

## 2021-06-16 DIAGNOSIS — I480 Paroxysmal atrial fibrillation: Secondary | ICD-10-CM

## 2021-06-16 MED ORDER — TAMSULOSIN HCL 0.4 MG PO CAPS: 0.8000 mg | ORAL_CAPSULE | Freq: Every day | ORAL | 0 refills | Status: DC

## 2021-06-16 NOTE — Telephone Encounter (Signed)
Left a message to call back.

## 2021-06-16 NOTE — Telephone Encounter (Signed)
Patient is calling in to request a refill on all of his medicines due to almost being out of his medications.He would like for them to be sent to the [facility] on [address].

## 2021-06-16 NOTE — Telephone Encounter (Signed)
Patient calling back in and went over list of medications. Sent in the Flomax to Patient's pharmacy.   All other medications had refills at [facility]. Informed the Patient to contact them for refills. Patient verbalized understanding  Patient will contact heart doctor about his Amiodarone.

## 2021-06-18 NOTE — Telephone Encounter (Signed)
Patient calling back in and verified that he is only taking the Tradjenta and metformin. [name],cma

## 2021-06-18 NOTE — Telephone Encounter (Signed)
Patient calling back in and verified that he is only taking the Tradjenta and metformin.

## 2021-07-08 ENCOUNTER — Ambulatory Visit (INDEPENDENT_AMBULATORY_CARE_PROVIDER_SITE_OTHER): Payer: Medicare Other

## 2021-07-08 DIAGNOSIS — I442 Atrioventricular block, complete: Secondary | ICD-10-CM | POA: Diagnosis not present

## 2021-07-09 LAB — CUP PACEART REMOTE DEVICE CHECK
Battery Remaining Longevity: 58 mo
Battery Remaining Percentage: 90 %
Battery Voltage: 2.99 V
Brady Statistic AP VP Percent: 69 %
Brady Statistic AP VS Percent: 1 %
Brady Statistic AS VP Percent: 30 %
Brady Statistic AS VS Percent: 1 %
Brady Statistic RA Percent Paced: 69 %
Brady Statistic RV Percent Paced: 99 %
Date Time Interrogation Session: 20220920020011
Implantable Lead Implant Date: 20211222
Implantable Lead Implant Date: 20211222
Implantable Lead Location: 753859
Implantable Lead Location: 753860
Implantable Pulse Generator Implant Date: 20211222
Lead Channel Impedance Value: 430 Ohm
Lead Channel Impedance Value: 660 Ohm
Lead Channel Pacing Threshold Amplitude: 0.5 V
Lead Channel Pacing Threshold Amplitude: 0.75 V
Lead Channel Pacing Threshold Pulse Width: 0.5 ms
Lead Channel Pacing Threshold Pulse Width: 0.5 ms
Lead Channel Sensing Intrinsic Amplitude: 12 mV
Lead Channel Sensing Intrinsic Amplitude: 2.1 mV
Lead Channel Setting Pacing Amplitude: 3.5 V
Lead Channel Setting Pacing Amplitude: 3.5 V
Lead Channel Setting Pacing Pulse Width: 0.5 ms
Lead Channel Setting Sensing Sensitivity: 2 mV
Pulse Gen Model: 2272
Pulse Gen Serial Number: 3874736

## 2021-07-14 NOTE — Progress Notes (Signed)
Remote pacemaker transmission.   

## 2021-07-20 ENCOUNTER — Encounter: Payer: Self-pay | Admitting: Family Medicine

## 2021-07-20 ENCOUNTER — Other Ambulatory Visit: Payer: Self-pay

## 2021-07-20 ENCOUNTER — Ambulatory Visit (INDEPENDENT_AMBULATORY_CARE_PROVIDER_SITE_OTHER): Payer: Medicare Other | Admitting: Family Medicine

## 2021-07-20 DIAGNOSIS — I10 Essential (primary) hypertension: Secondary | ICD-10-CM | POA: Diagnosis not present

## 2021-07-20 DIAGNOSIS — E1165 Type 2 diabetes mellitus with hyperglycemia: Secondary | ICD-10-CM | POA: Diagnosis not present

## 2021-07-20 DIAGNOSIS — R351 Nocturia: Secondary | ICD-10-CM

## 2021-07-20 DIAGNOSIS — F17210 Nicotine dependence, cigarettes, uncomplicated: Secondary | ICD-10-CM

## 2021-07-20 DIAGNOSIS — R21 Rash and other nonspecific skin eruption: Secondary | ICD-10-CM

## 2021-07-20 DIAGNOSIS — N401 Enlarged prostate with lower urinary tract symptoms: Secondary | ICD-10-CM

## 2021-07-20 DIAGNOSIS — G4700 Insomnia, unspecified: Secondary | ICD-10-CM

## 2021-07-20 MED ORDER — TRAZODONE HCL 50 MG PO TABS: 25.0000 mg | ORAL_TABLET | Freq: Every evening | ORAL | 0 refills | Status: DC | PRN

## 2021-07-20 MED ORDER — NICOTINE 21 MG/24HR TD PT24: 21.0000 mg | MEDICATED_PATCH | Freq: Every day | TRANSDERMAL | 0 refills | Status: DC

## 2021-07-20 MED ORDER — TRIAMCINOLONE ACETONIDE 0.1 % EX OINT: 1.0000 "application " | TOPICAL_OINTMENT | Freq: Two times a day (BID) | CUTANEOUS | 0 refills | Status: DC | PRN

## 2021-07-20 MED ORDER — NICOTINE 7 MG/24HR TD PT24: 7.0000 mg | MEDICATED_PATCH | Freq: Every day | TRANSDERMAL | 0 refills | Status: DC

## 2021-07-20 MED ORDER — FINASTERIDE 5 MG PO TABS: 5.0000 mg | ORAL_TABLET | Freq: Every day | ORAL | 2 refills | Status: AC

## 2021-07-20 MED ORDER — NICOTINE POLACRILEX 2 MG MT GUM: 2.0000 mg | CHEWING_GUM | OROMUCOSAL | 0 refills | Status: DC | PRN

## 2021-07-20 MED ORDER — NICOTINE 14 MG/24HR TD PT24: 14.0000 mg | MEDICATED_PATCH | Freq: Every day | TRANSDERMAL | 0 refills | Status: DC

## 2021-07-20 NOTE — Assessment & Plan Note (Signed)
Encourage smoking cessation.  We will treat him with nicotine patches augmented by nicotine gum.

## 2021-07-20 NOTE — Assessment & Plan Note (Signed)
Uncontrolled.  Given his lightheadedness we will have him decrease the Flomax to 1 tablet once daily.  We will add Proscar.  I advised if his symptoms are not improving with this regimen he would need to see urology.

## 2021-07-20 NOTE — Assessment & Plan Note (Signed)
Plan for an A1c at his next visit.  He will continue Tradjenta 5 mg once daily and metformin 500 mg twice daily.

## 2021-07-20 NOTE — Assessment & Plan Note (Addendum)
Undetermined cause.  Reports rash on his leg.  I advised that he needs to take a break from the triamcinolone given risk of skin thinning from chronic use of steroids.  We will refer to dermatology.  Discussed after he takes a 5-day break he can resume the triamcinolone for up to 2 weeks and then needs to take a break every 2 weeks.

## 2021-07-20 NOTE — Progress Notes (Signed)
Virtual Visit via telephone Note  This visit type was conducted due to national recommendations for restrictions regarding the COVID-19 pandemic (e.g. social distancing).  This format is felt to be most appropriate for this patient at this time.  All issues noted in this document were discussed and addressed.  No physical exam was performed (except for noted visual exam findings with Video Visits).   I connected with Zachary Santos today at  1:45 PM EDT by a video enabled telemedicine application or telephone and verified that I am speaking with the correct person using two identifiers. Location patient: home Location provider: work  Persons participating in the virtual visit: patient, provider  I discussed the limitations, risks, security and privacy concerns of performing an evaluation and management service by telephone and the availability of in person appointments. I also discussed with the patient that there may be a patient responsible charge related to this service. The patient expressed understanding and agreed to proceed.  Interactive audio and video telecommunications were attempted between this provider and patient, however failed, due to patient having technical difficulties OR patient did not have access to video capability.  We continued and completed visit with audio only.   Reason for visit: f/u.  HPI: HYPERTENSION Disease Monitoring Home BP Monitoring typically 127-137/68, though has dropped down to 105/50 at times Chest pain- no    Dyspnea- no change to chronic dyspnea Medications Compliance-  taking amlodipine, lisinopril. Lightheadedness-  yes  BMET    Component Value Date/Time   NA 141 04/17/2021 1451   NA 140 08/06/2014 0443   K 4.5 04/17/2021 1451   K 3.9 08/06/2014 0443   CL 106 04/17/2021 1451   CL 106 08/06/2014 0443   CO2 25 04/17/2021 1451   CO2 26 08/06/2014 0443   GLUCOSE 161 (H) 04/17/2021 1451   GLUCOSE 117 (H) 08/06/2014 0443   BUN 17 04/17/2021  1451   BUN 19 (H) 08/06/2014 0443   CREATININE 1.51 (H) 04/17/2021 1451   CALCIUM 9.6 04/17/2021 1451   CALCIUM 8.3 (L) 08/06/2014 0443   GFRNONAA 49 (L) 11/24/2020 1604   GFRNONAA 53 (L) 08/06/2014 0443   GFRNONAA 38 (L) 04/18/2014 1046   GFRAA 52 (L) 05/16/2020 1450   GFRAA >60 08/06/2014 0443   GFRAA 44 (L) 04/18/2014 1046   DIABETES Disease Monitoring: Blood Sugar ranges-not checking Polyuria/phagia/dipsia- no      Optho- due Medications: Compliance- taking tradjenta, metformin  BPH: He has been on Flomax 0.4 mg twice daily.  He notes his stream seems to be slow.  He feels he does not empty his bladder fully.  No dysuria.  No frequency.  He does have urgency.  He has nocturia 1-2 times per night.  Sleep difficulty: Patient notes a lot of stress related to his wife breaking her hip and sternum.  He has been going back and forth to rehab.  He tries to go to bed around 12 AM and notes he cannot fall asleep and does wake up frequently once he does fall asleep.  He has 1 cup of coffee in the morning.  No alcohol.  No screens.  No depression.  No anxiety.  Rash: This is on his legs.  He has been using triamcinolone cream daily for almost a year.  He notes he tried to get into see dermatology and they advised him that would be April before he could be seen.  Tobacco abuse: He continues to smoke 2 packs/day.  He wore the patch  for about 6 weeks and notes it worked quite well.  He did not smoke at all.  He did not transition to the second step patch and started smoking again.    ROS: See pertinent positives and negatives per HPI.  Past Medical History:  Diagnosis Date   Anxiety    BPH (benign prostatic hyperplasia)    Chronic fatigue    CKD (chronic kidney disease), stage III (HCC)    Claudication (HCC)    a. R Hip.   COPD (chronic obstructive pulmonary disease) (HCC)    Dyspnea    with exertion   Essential hypertension    a. 01/2008 Echo: EF 65%, no rwma, mod increased PASP.    Hypercholesterolemia    Osteoarthritis    Paget's disease    Pneumonia due to COVID-19 virus 10/31/2020   Syncope    a. 03/2015 in setting of hypotension.   Tobacco abuse    a. 1-1.5 ppd x 60+ yrs.   Type II diabetes mellitus ([place])    Wears partial dentures    uppers    Past Surgical History:  Procedure Laterality Date   APPENDECTOMY     CATARACT EXTRACTION W/PHACO Left 12/11/2019   Procedure: CATARACT EXTRACTION PHACO AND INTRAOCULAR LENS PLACEMENT ([place]) LEFT DIABETIC MALYUGIN;  Surgeon: Birder Robson, MD;  Location: [facility];  Service: Ophthalmology;  Laterality: Left;  CDE 11.45 U/S  1:05.7   CATARACT EXTRACTION W/PHACO Right 01/08/2020   Procedure: CATARACT EXTRACTION PHACO AND INTRAOCULAR LENS PLACEMENT ([facility]) RIGHT DIABETIC 5.75  00:49.3;  Surgeon: Birder Robson, MD;  Location: [facility];  Service: Ophthalmology;  Laterality: Right;  Diabetic   laparscopic chole     PACEMAKER IMPLANT N/A 10/08/2020   Procedure: PACEMAKER IMPLANT;  Surgeon: Vickie Epley, MD;  Location: [facility];  Service: Cardiovascular;  Laterality: N/A;   SPINE SURGERY     cervical spine surgery   trigeminal neuralgia     with surgery in [place]    Family History  Problem Relation Age of Onset   Cancer Mother        died @ 28.   COPD Father        died @ 70.   Diabetes Brother        died in early 92's.    SOCIAL HX: Smoker   Current Outpatient Medications:    amiodarone (PACERONE) 200 MG tablet, Take 1 tablet (200 mg total) by mouth daily., Disp: 30 tablet, Rfl: 3   apixaban (ELIQUIS) 5 MG TABS tablet, Take 1 tablet (5 mg total) by mouth 2 (two) times daily., Disp: 180 tablet, Rfl: 3   azelastine (ASTELIN) 0.1 % nasal spray, Place 2 sprays into both nostrils 2 (two) times daily. Use in each nostril as directed, Disp: 30 mL, Rfl: 12   blood glucose meter kit and supplies KIT, Dispense based on patient and insurance preference. Use up to four times  daily as directed. (FOR ICD-9 250.00, 250.01)., Disp: 1 each, Rfl: 0   finasteride (PROSCAR) 5 MG tablet, Take 1 tablet (5 mg total) by mouth daily., Disp: 30 tablet, Rfl: 2   linagliptin (TRADJENTA) 5 MG TABS tablet, Take 1 tablet (5 mg total) by mouth daily., Disp: 90 tablet, Rfl: 2   lisinopril (ZESTRIL) 20 MG tablet, Take 1 tablet (20 mg total) by mouth daily., Disp: 90 tablet, Rfl: 3   metFORMIN (GLUCOPHAGE XR) 500 MG 24 hr tablet, Take 1 tablet (500 mg total) by mouth in the  morning and at bedtime., Disp: 180 tablet, Rfl: 1   nicotine (NICODERM CQ - DOSED IN MG/24 HOURS) 14 mg/24hr patch, Place 1 patch (14 mg total) onto the skin daily. Start after finishing the step 1 patch., Disp: 14 patch, Rfl: 0   nicotine (NICODERM CQ - DOSED IN MG/24 HOURS) 21 mg/24hr patch, Place 1 patch (21 mg total) onto the skin daily. Use for 6 weeks., Disp: 42 patch, Rfl: 0   nicotine (NICODERM CQ - DOSED IN MG/24 HR) 7 mg/24hr patch, Place 1 patch (7 mg total) onto the skin daily. Start after finishing the step 2 patch., Disp: 14 patch, Rfl: 0   nicotine polacrilex (NICORETTE) 2 MG gum, Take 1 each (2 mg total) by mouth as needed for smoking cessation. May use every 2 hours as needed., Disp: 100 tablet, Rfl: 0   ONE TOUCH ULTRA TEST test strip, USE TEST STRIP TO CHECK GLUCOSE AT LEAST ONCE DAILY, Disp: 50 each, Rfl: 11   rosuvastatin (CRESTOR) 20 MG tablet, Take 1 tablet (20 mg total) by mouth daily., Disp: 90 tablet, Rfl: 3   tamsulosin (FLOMAX) 0.4 MG CAPS capsule, Take 2 capsules (0.8 mg total) by mouth daily., Disp: 180 capsule, Rfl: 0   traZODone (DESYREL) 50 MG tablet, Take 0.5-1 tablets (25-50 mg total) by mouth at bedtime as needed for sleep., Disp: 30 tablet, Rfl: 0   triamcinolone ointment (KENALOG) 0.1 %, Apply 1 application topically 2 (two) times daily as needed (rash)., Disp: 30 g, Rfl: 0   vitamin B-12 (CYANOCOBALAMIN) 500 MCG tablet, Take 500 mcg by mouth daily., Disp: , Rfl:   EXAM: This was a  telephone visit and thus no physical exam was completed.  ASSESSMENT AND PLAN:  Discussed the following assessment and plan:  Problem List Items Addressed This Visit     BPH (benign prostatic hyperplasia)    Uncontrolled.  Given his lightheadedness we will have him decrease the Flomax to 1 tablet once daily.  We will add Proscar.  I advised if his symptoms are not improving with this regimen he would need to see urology.      Relevant Medications   finasteride (PROSCAR) 5 MG tablet   Essential hypertension, benign    Intermittent issues with lightheadedness.  In addition to decreasing the dose of his Flomax we will have him discontinue his amlodipine.  He will monitor his blood pressure and if it trends up over 140/90 he will let us know.  He will continue lisinopril 20 mg once daily.  Plan for labs in 6 weeks at follow-up.      Insomnia    Chronic ongoing issue.  Discussed that the stress from his wife being hospitalized and then in rehab is likely contributing.  We will trial a low-dose of trazodone to see if that will help with sleep.  Discussed monitoring for drowsiness.      Relevant Medications   traZODone (DESYREL) 50 MG tablet   Nicotine dependence, cigarettes, uncomplicated    Encourage smoking cessation.  We will treat him with nicotine patches augmented by nicotine gum.      Relevant Medications   nicotine (NICODERM CQ - DOSED IN MG/24 HOURS) 21 mg/24hr patch   nicotine (NICODERM CQ - DOSED IN MG/24 HOURS) 14 mg/24hr patch   nicotine (NICODERM CQ - DOSED IN MG/24 HR) 7 mg/24hr patch   nicotine polacrilex (NICORETTE) 2 MG gum   Rash    Undetermined cause.  Reports rash on his leg.  I advised  that he needs to take a break from the triamcinolone given risk of skin thinning from chronic use of steroids.  We will refer to dermatology.  Discussed after he takes a 5-day break he can resume the triamcinolone for up to 2 weeks and then needs to take a break every 2 weeks.       Relevant Medications   triamcinolone ointment (KENALOG) 0.1 %   Other Relevant Orders   Ambulatory referral to Dermatology   Type 2 diabetes mellitus with hyperglycemia, without long-term current use of insulin ([place])    Plan for an A1c at his next visit.  He will continue Tradjenta 5 mg once daily and metformin 500 mg twice daily.       Return in about 6 weeks (around 08/31/2021) for dm, sleep issues.   I discussed the assessment and treatment plan with the patient. The patient was provided an opportunity to ask questions and all were answered. The patient agreed with the plan and demonstrated an understanding of the instructions.   The patient was advised to call back or seek an in-person evaluation if the symptoms worsen or if the condition fails to improve as anticipated.  I provided 25 minutes of non-face-to-face time during this encounter.   [name], MD

## 2021-07-20 NOTE — Assessment & Plan Note (Signed)
Intermittent issues with lightheadedness.  In addition to decreasing the dose of his Flomax we will have him discontinue his amlodipine.  He will monitor his blood pressure and if it trends up over 140/90 he will let us know.  He will continue lisinopril 20 mg once daily.  Plan for labs in 6 weeks at follow-up.

## 2021-07-20 NOTE — Assessment & Plan Note (Signed)
Chronic ongoing issue.  Discussed that the stress from his wife being hospitalized and then in rehab is likely contributing.  We will trial a low-dose of trazodone to see if that will help with sleep.  Discussed monitoring for drowsiness.

## 2021-07-28 ENCOUNTER — Ambulatory Visit: Payer: Medicare Other | Admitting: Dermatology

## 2021-07-30 ENCOUNTER — Telehealth: Payer: Self-pay | Admitting: Family Medicine

## 2021-07-30 NOTE — Telephone Encounter (Signed)
Patient called in stating that he had a telephone visit with Dr.Sonnenberg on 10/3 and he would like for him to have a follow up visit in office around 10/25.No available openings until 12/6,please advise.

## 2021-07-31 NOTE — Telephone Encounter (Signed)
I called the patient to see if he can be seen at 3:15 on 08/10/2021 and I got a busy signal.  Brita Jurgensen,cma

## 2021-07-31 NOTE — Telephone Encounter (Signed)
Patient calling back in and scheduled

## 2021-08-10 ENCOUNTER — Other Ambulatory Visit: Payer: Self-pay

## 2021-08-10 ENCOUNTER — Ambulatory Visit: Payer: Medicare Other | Admitting: Family Medicine

## 2021-08-10 ENCOUNTER — Telehealth: Payer: Self-pay | Admitting: Family Medicine

## 2021-08-10 ENCOUNTER — Encounter: Payer: Self-pay | Admitting: Family Medicine

## 2021-08-10 NOTE — Progress Notes (Signed)
I attempted to contact the patient 3 times by phone for his visit around the time that his visit was scheduled for.  There was no answer.  I left a message twice asking him to call back to the office.  As of 4:15 PM on 08/10/2021 I have not received a call back.  Phone message created for CMA to contact patient to get him scheduled for evaluation of his fever.

## 2021-08-10 NOTE — Telephone Encounter (Signed)
I attempted to contact the patient 3 times by phone for his visit around the time that his visit was scheduled for.  There was no answer.  I left a message twice asking him to call back to the office.  As of 4:15 PM on 08/10/2021 I have not received a call back.  Please reach out to him to get him scheduled for a visit to discuss his fever and cough with myself or any available provider.  Thanks.

## 2021-08-13 ENCOUNTER — Other Ambulatory Visit: Payer: Self-pay

## 2021-08-13 NOTE — Telephone Encounter (Signed)
I called the patient and he is scheduled to see M. Arnette next week for his fever and cough and he is also scheduled to see his PCP for  a follow up in December. Zachary Santos,cma

## 2021-08-19 ENCOUNTER — Encounter: Payer: Self-pay | Admitting: Family Medicine

## 2021-08-19 ENCOUNTER — Other Ambulatory Visit: Payer: Self-pay

## 2021-08-19 ENCOUNTER — Telehealth (INDEPENDENT_AMBULATORY_CARE_PROVIDER_SITE_OTHER): Payer: Medicare Other | Admitting: Family Medicine

## 2021-08-19 DIAGNOSIS — R21 Rash and other nonspecific skin eruption: Secondary | ICD-10-CM | POA: Diagnosis not present

## 2021-08-19 DIAGNOSIS — F17210 Nicotine dependence, cigarettes, uncomplicated: Secondary | ICD-10-CM

## 2021-08-19 DIAGNOSIS — E1165 Type 2 diabetes mellitus with hyperglycemia: Secondary | ICD-10-CM

## 2021-08-19 DIAGNOSIS — I482 Chronic atrial fibrillation, unspecified: Secondary | ICD-10-CM | POA: Diagnosis not present

## 2021-08-19 DIAGNOSIS — G4700 Insomnia, unspecified: Secondary | ICD-10-CM

## 2021-08-19 MED ORDER — TRIAMCINOLONE ACETONIDE 0.1 % EX OINT: 1.0000 "application " | TOPICAL_OINTMENT | Freq: Two times a day (BID) | CUTANEOUS | 0 refills | Status: DC | PRN

## 2021-08-19 MED ORDER — BUPROPION HCL ER (SR) 150 MG PO TB12: 150.0000 mg | ORAL_TABLET | Freq: Two times a day (BID) | ORAL | 3 refills | Status: DC

## 2021-08-19 MED ORDER — TRAZODONE HCL 50 MG PO TABS: 25.0000 mg | ORAL_TABLET | Freq: Every evening | ORAL | 0 refills | Status: DC | PRN

## 2021-08-19 NOTE — Assessment & Plan Note (Signed)
Asymptomatic.  He will continue Eliquis 5 mg once daily.  I advised him that he had refills available at the pharmacy.  He will continue his other medications through cardiology.

## 2021-08-19 NOTE — Progress Notes (Signed)
Virtual Visit via telephone Note  This visit type was conducted due to national recommendations for restrictions regarding the COVID-19 pandemic (e.g. social distancing).  This format is felt to be most appropriate for this patient at this time.  All issues noted in this document were discussed and addressed.  No physical exam was performed (except for noted visual exam findings with Video Visits).   I connected with Zachary Santos today at  9:00 AM EDT by telephone and verified that I am speaking with the correct person using two identifiers. Location patient: home Location provider: work Persons participating in the virtual visit: patient, provider  I discussed the limitations, risks, security and privacy concerns of performing an evaluation and management service by telephone and the availability of in person appointments. I also discussed with the patient that there may be a patient responsible charge related to this service. The patient expressed understanding and agreed to proceed.  Interactive audio and video telecommunications were attempted between this provider and patient, however failed, due to patient having technical difficulties OR patient did not have access to video capability.  We continued and completed visit with audio only.   Reason for visit: f/u.  HPI: DIABETES Disease Monitoring: Blood Sugar ranges-not checking Polyuria/phagia/dipsia- no      Optho- due Medications: Compliance- taking metformin, tradjenta Hypoglycemic symptoms- no  Atrial fibrillation: Patient is on Eliquis and amiodarone.  He notes no palpitations.  No bleeding issues.  Dermatitis: Patient notes this occurs on his legs intermittently.  He uses triamcinolone ointment on it on a daily basis.  He notes he was advised he had internal dermatitis many years ago.  Tobacco abuse: Patient notes the patches did not help.  He is ready to quit smoking.  He has heard bad things about Chantix.  He notes no  history of seizures.    ROS: See pertinent positives and negatives per HPI.  Past Medical History:  Diagnosis Date   Anxiety    BPH (benign prostatic hyperplasia)    Chronic fatigue    CKD (chronic kidney disease), stage III (HCC)    Claudication (HCC)    a. R Hip.   COPD (chronic obstructive pulmonary disease) (HCC)    Dyspnea    with exertion   Essential hypertension    a. 01/2008 Echo: EF 65%, no rwma, mod increased PASP.   Hypercholesterolemia    Osteoarthritis    Paget's disease    Pneumonia due to COVID-19 virus 10/31/2020   Syncope    a. 03/2015 in setting of hypotension.   Tobacco abuse    a. 1-1.5 ppd x 60+ yrs.   Type II diabetes mellitus ([place])    Wears partial dentures    uppers    Past Surgical History:  Procedure Laterality Date   APPENDECTOMY     CATARACT EXTRACTION W/PHACO Left 12/11/2019   Procedure: CATARACT EXTRACTION PHACO AND INTRAOCULAR LENS PLACEMENT ([place]) LEFT DIABETIC MALYUGIN;  Surgeon: Birder Robson, MD;  Location: [facility];  Service: Ophthalmology;  Laterality: Left;  CDE 11.45 U/S  1:05.7   CATARACT EXTRACTION W/PHACO Right 01/08/2020   Procedure: CATARACT EXTRACTION PHACO AND INTRAOCULAR LENS PLACEMENT ([facility]) RIGHT DIABETIC 5.75  00:49.3;  Surgeon: Birder Robson, MD;  Location: [facility];  Service: Ophthalmology;  Laterality: Right;  Diabetic   laparscopic chole     PACEMAKER IMPLANT N/A 10/08/2020   Procedure: PACEMAKER IMPLANT;  Surgeon: Vickie Epley, MD;  Location: [facility];  Service: Cardiovascular;  Laterality:  N/A;   SPINE SURGERY     cervical spine surgery   trigeminal neuralgia     with surgery in [place]    Family History  Problem Relation Age of Onset   Cancer Mother        died @ 57.   COPD Father        died @ 26.   Diabetes Brother        died in early 65's.    SOCIAL HX: Smoker   Current Outpatient Medications:    amiodarone (PACERONE) 200 MG tablet, Take 1 tablet  (200 mg total) by mouth daily., Disp: 30 tablet, Rfl: 3   apixaban (ELIQUIS) 5 MG TABS tablet, Take 1 tablet (5 mg total) by mouth 2 (two) times daily., Disp: 180 tablet, Rfl: 3   azelastine (ASTELIN) 0.1 % nasal spray, Place 2 sprays into both nostrils 2 (two) times daily. Use in each nostril as directed, Disp: 30 mL, Rfl: 12   blood glucose meter kit and supplies KIT, Dispense based on patient and insurance preference. Use up to four times daily as directed. (FOR ICD-9 250.00, 250.01)., Disp: 1 each, Rfl: 0   buPROPion (WELLBUTRIN SR) 150 MG 12 hr tablet, Take 1 tablet (150 mg total) by mouth 2 (two) times daily., Disp: 60 tablet, Rfl: 3   finasteride (PROSCAR) 5 MG tablet, Take 1 tablet (5 mg total) by mouth daily., Disp: 30 tablet, Rfl: 2   linagliptin (TRADJENTA) 5 MG TABS tablet, Take 1 tablet (5 mg total) by mouth daily., Disp: 90 tablet, Rfl: 2   lisinopril (ZESTRIL) 20 MG tablet, Take 1 tablet (20 mg total) by mouth daily., Disp: 90 tablet, Rfl: 3   metFORMIN (GLUCOPHAGE XR) 500 MG 24 hr tablet, Take 1 tablet (500 mg total) by mouth in the morning and at bedtime., Disp: 180 tablet, Rfl: 1   nicotine (NICODERM CQ - DOSED IN MG/24 HOURS) 14 mg/24hr patch, Place 1 patch (14 mg total) onto the skin daily. Start after finishing the step 1 patch., Disp: 14 patch, Rfl: 0   nicotine (NICODERM CQ - DOSED IN MG/24 HOURS) 21 mg/24hr patch, Place 1 patch (21 mg total) onto the skin daily. Use for 6 weeks., Disp: 42 patch, Rfl: 0   nicotine (NICODERM CQ - DOSED IN MG/24 HR) 7 mg/24hr patch, Place 1 patch (7 mg total) onto the skin daily. Start after finishing the step 2 patch., Disp: 14 patch, Rfl: 0   nicotine polacrilex (NICORETTE) 2 MG gum, Take 1 each (2 mg total) by mouth as needed for smoking cessation. May use every 2 hours as needed., Disp: 100 tablet, Rfl: 0   ONE TOUCH ULTRA TEST test strip, USE TEST STRIP TO CHECK GLUCOSE AT LEAST ONCE DAILY, Disp: 50 each, Rfl: 11   rosuvastatin (CRESTOR) 20 MG  tablet, Take 1 tablet (20 mg total) by mouth daily., Disp: 90 tablet, Rfl: 3   tamsulosin (FLOMAX) 0.4 MG CAPS capsule, Take 2 capsules (0.8 mg total) by mouth daily., Disp: 180 capsule, Rfl: 0   vitamin B-12 (CYANOCOBALAMIN) 500 MCG tablet, Take 500 mcg by mouth daily., Disp: , Rfl:    traZODone (DESYREL) 50 MG tablet, Take 0.5-1 tablets (25-50 mg total) by mouth at bedtime as needed for sleep., Disp: 30 tablet, Rfl: 0   triamcinolone ointment (KENALOG) 0.1 %, Apply 1 application topically 2 (two) times daily as needed (rash)., Disp: 453.6 g, Rfl: 0  EXAM: This was a telephone visit and thus no physical  exam was completed.  ASSESSMENT AND PLAN:  Discussed the following assessment and plan:  Problem List Items Addressed This Visit     Atrial fibrillation, chronic ([place])    Asymptomatic.  He will continue Eliquis 5 mg once daily.  I advised him that he had refills available at the pharmacy.  He will continue his other medications through cardiology.      Insomnia   Relevant Medications   traZODone (DESYREL) 50 MG tablet   Nicotine dependence, cigarettes, uncomplicated    Patient was counseled on smoking cessation.  He is ready to quit.  Patches were not beneficial.  Given that he has no seizure history we will trial Wellbutrin.  Discussed potential side effects of tachycardia, sweating, constipation, nausea, vomiting, dry mouth, or psychiatric issues.  If he develops any side effects he will let us know.      Relevant Medications   buPROPion (WELLBUTRIN SR) 150 MG 12 hr tablet   Rash    This is a chronic issue.  Response to triamcinolone ointment.  I again advised that he needs to take a break from the triamcinolone every 14 days.  Discussed he needs to take at least 2 days off.  Discussed the risk of skin thinning and hypopigmentation of the skin with chronic use of steroids topically.  I encouraged him to get the appointment set up with dermatology.      Relevant Medications    triamcinolone ointment (KENALOG) 0.1 %   Type 2 diabetes mellitus with hyperglycemia, without long-term current use of insulin (HCC)    Undetermined control.  He will come in for an A1c.  He notes he is caring for his wife right now who broke her hip and sternum.  He notes he will have to find somebody to care for her while he is coming in for labs and he will contact us to schedule the lab work.  He will continue metformin XR 500 mg twice daily and Tradjenta 5 mg daily. I advised that he call and schedule his ophthalmology appointment.       Relevant Orders   Basic Metabolic Panel (BMET)   HgB A1c    Return in about 3 months (around 11/19/2021).   I discussed the assessment and treatment plan with the patient. The patient was provided an opportunity to ask questions and all were answered. The patient agreed with the plan and demonstrated an understanding of the instructions.   The patient was advised to call back or seek an in-person evaluation if the symptoms worsen or if the condition fails to improve as anticipated.  I provided 13 minutes of non-face-to-face time during this encounter.   [name], MD

## 2021-08-19 NOTE — Assessment & Plan Note (Signed)
Patient was counseled on smoking cessation.  He is ready to quit.  Patches were not beneficial.  Given that he has no seizure history we will trial Wellbutrin.  Discussed potential side effects of tachycardia, sweating, constipation, nausea, vomiting, dry mouth, or psychiatric issues.  If he develops any side effects he will let us know.

## 2021-08-19 NOTE — Assessment & Plan Note (Signed)
This is a chronic issue.  Response to triamcinolone ointment.  I again advised that he needs to take a break from the triamcinolone every 14 days.  Discussed he needs to take at least 2 days off.  Discussed the risk of skin thinning and hypopigmentation of the skin with chronic use of steroids topically.  I encouraged him to get the appointment set up with dermatology.

## 2021-08-19 NOTE — Assessment & Plan Note (Addendum)
Undetermined control.  He will come in for an A1c.  He notes he is caring for his wife right now who broke her hip and sternum.  He notes he will have to find somebody to care for her while he is coming in for labs and he will contact us to schedule the lab work.  He will continue metformin XR 500 mg twice daily and Tradjenta 5 mg daily. I advised that he call and schedule his ophthalmology appointment.

## 2021-08-25 ENCOUNTER — Other Ambulatory Visit: Payer: Self-pay | Admitting: Family Medicine

## 2021-08-25 DIAGNOSIS — R21 Rash and other nonspecific skin eruption: Secondary | ICD-10-CM

## 2021-09-15 ENCOUNTER — Ambulatory Visit: Payer: Medicare Other | Admitting: Family

## 2021-09-25 ENCOUNTER — Other Ambulatory Visit: Payer: Self-pay

## 2021-09-25 ENCOUNTER — Ambulatory Visit (INDEPENDENT_AMBULATORY_CARE_PROVIDER_SITE_OTHER): Payer: Medicare Other | Admitting: Family Medicine

## 2021-09-25 ENCOUNTER — Encounter: Payer: Self-pay | Admitting: Family Medicine

## 2021-09-28 NOTE — Progress Notes (Signed)
Opened in error

## 2021-10-07 ENCOUNTER — Ambulatory Visit (INDEPENDENT_AMBULATORY_CARE_PROVIDER_SITE_OTHER): Payer: Medicare Other

## 2021-10-07 DIAGNOSIS — I442 Atrioventricular block, complete: Secondary | ICD-10-CM

## 2021-10-07 LAB — CUP PACEART REMOTE DEVICE CHECK
Battery Remaining Longevity: 54 mo
Battery Remaining Percentage: 86 %
Battery Voltage: 2.98 V
Brady Statistic AP VP Percent: 73 %
Brady Statistic AP VS Percent: 1 %
Brady Statistic AS VP Percent: 27 %
Brady Statistic AS VS Percent: 1 %
Brady Statistic RA Percent Paced: 72 %
Brady Statistic RV Percent Paced: 99 %
Date Time Interrogation Session: 20221221020014
Implantable Lead Implant Date: 20211222
Implantable Lead Implant Date: 20211222
Implantable Lead Location: 753859
Implantable Lead Location: 753860
Implantable Pulse Generator Implant Date: 20211222
Lead Channel Impedance Value: 450 Ohm
Lead Channel Impedance Value: 660 Ohm
Lead Channel Pacing Threshold Amplitude: 0.5 V
Lead Channel Pacing Threshold Amplitude: 0.75 V
Lead Channel Pacing Threshold Pulse Width: 0.5 ms
Lead Channel Pacing Threshold Pulse Width: 0.5 ms
Lead Channel Sensing Intrinsic Amplitude: 10.1 mV
Lead Channel Sensing Intrinsic Amplitude: 2.7 mV
Lead Channel Setting Pacing Amplitude: 3.5 V
Lead Channel Setting Pacing Amplitude: 3.5 V
Lead Channel Setting Pacing Pulse Width: 0.5 ms
Lead Channel Setting Sensing Sensitivity: 2 mV
Pulse Gen Model: 2272
Pulse Gen Serial Number: 3874736

## 2021-10-08 ENCOUNTER — Telehealth: Payer: Self-pay

## 2021-10-08 NOTE — Telephone Encounter (Signed)
I called and LVM for patient to call back for a follow up appointment and a lab appointment before his follow up.  Alani Sabbagh,cma

## 2021-10-08 NOTE — Telephone Encounter (Signed)
-----   Message from Gordy Councilman, [place] sent at 09/25/2021  1:17 PM EST ----- Call patient to make sure he is scheduled for lab and follow up

## 2021-10-13 IMAGING — CT CT CERVICAL SPINE W/O CM
2 series · 14 of 27 positions shown, 18 images · non-contrast
Comparison: CT head 02/04/2008.

CLINICAL DATA: Passing out and hitting head.  Nausea.

EXAM:
CT HEAD WITHOUT CONTRAST
CT CERVICAL SPINE WITHOUT CONTRAST
TECHNIQUE: Multidetector CT imaging of the head and cervical spine was
performed following the standard protocol without intravenous
contrast. Multiplanar CT image reconstructions of the cervical spine
were also generated.

[Series 3: c spine soft · axial · 0.42mm/px · z∈[+1035,+1177]mm · 9 of 85 slices shown, 12 images]
[im 7/85  soft-tissue]
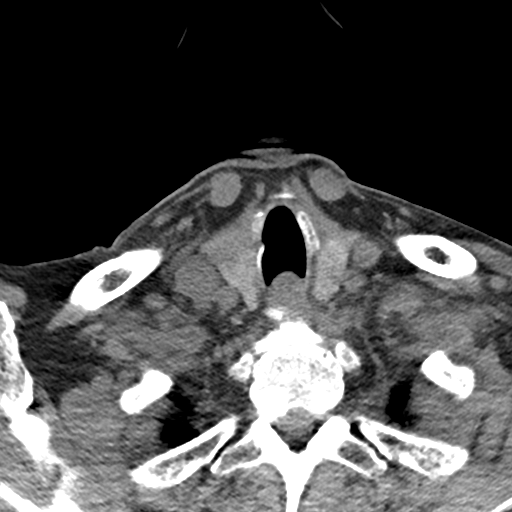
[im 7/85  bone]
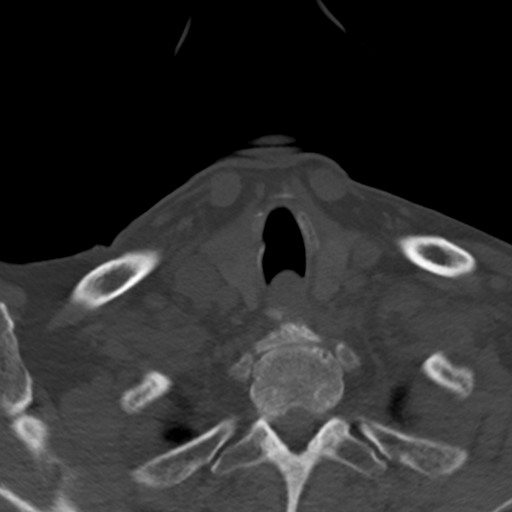
[im 20/85  bone]
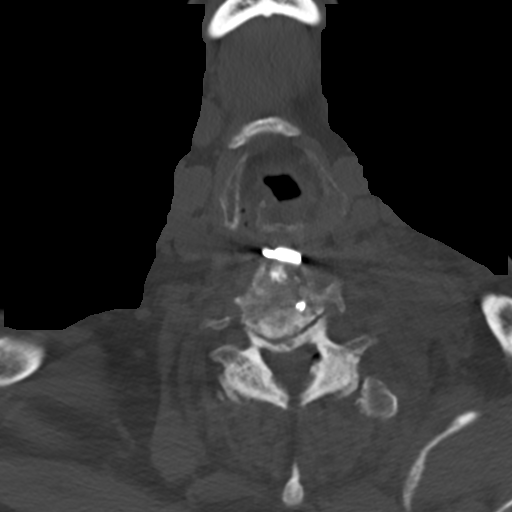
[im 26/85  bone]
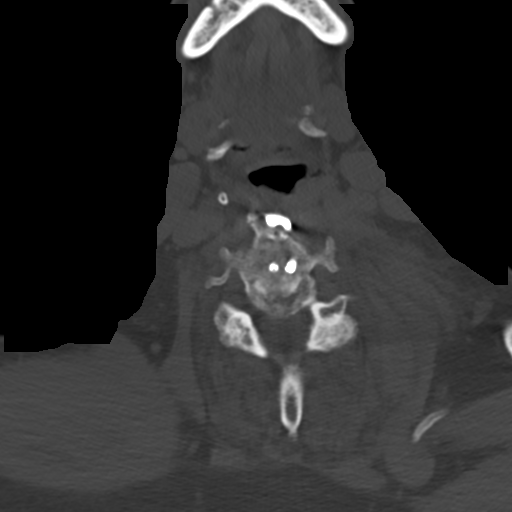
[im 33/85  bone]
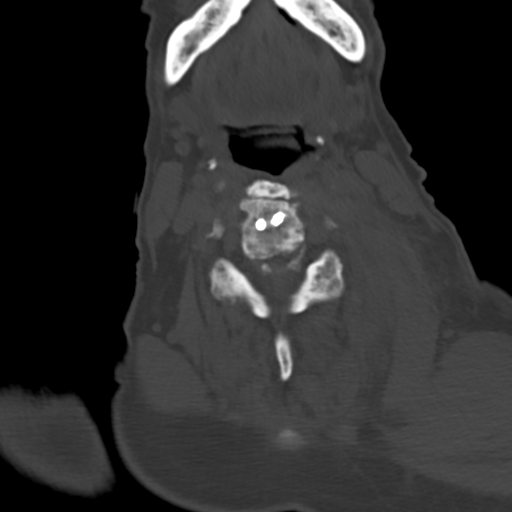
[im 46/85  soft-tissue]
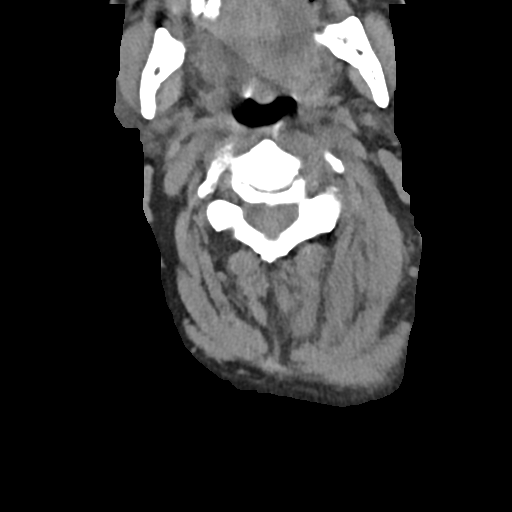
[im 46/85  bone]
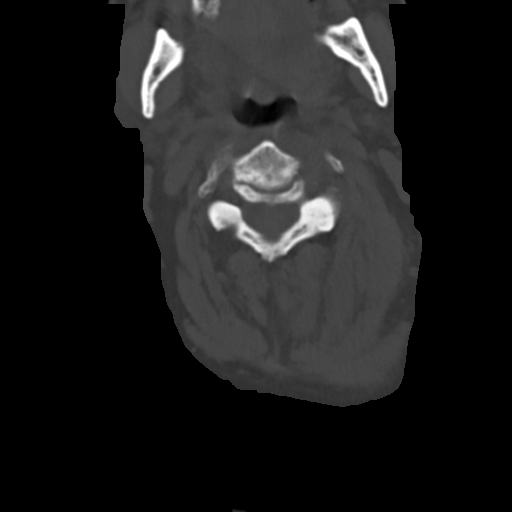
[im 52/85  bone]
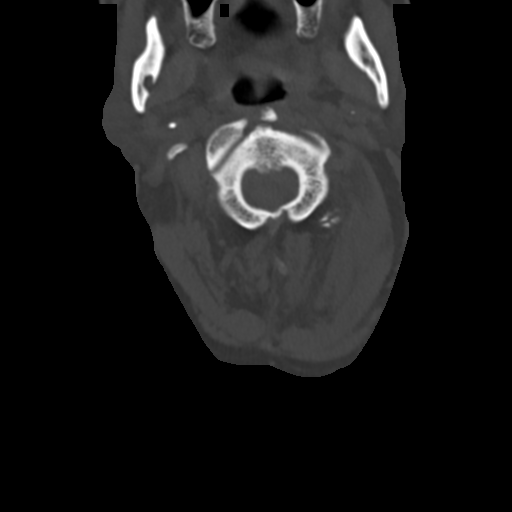
[im 59/85  bone]
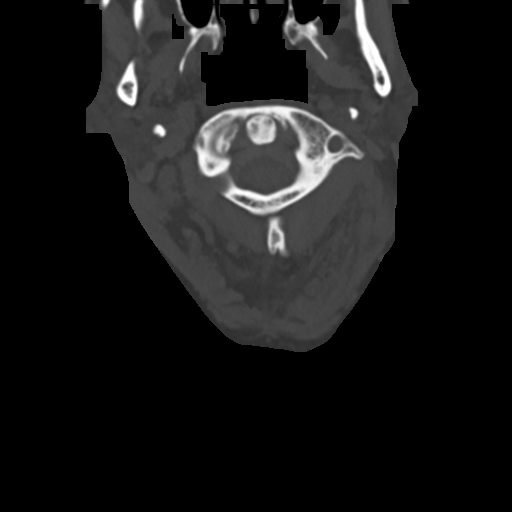
[im 72/85  bone]
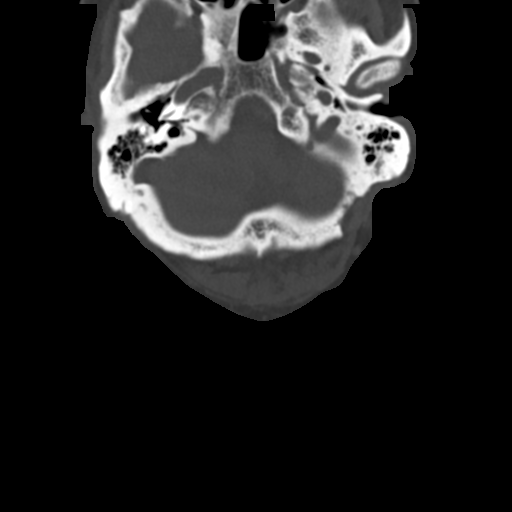
[im 78/85  soft-tissue]
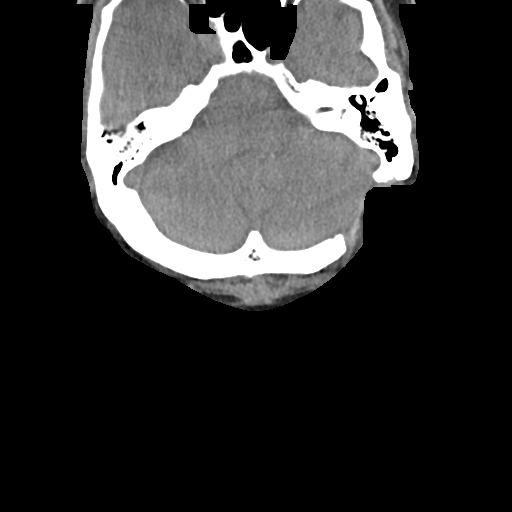
[im 78/85  bone]
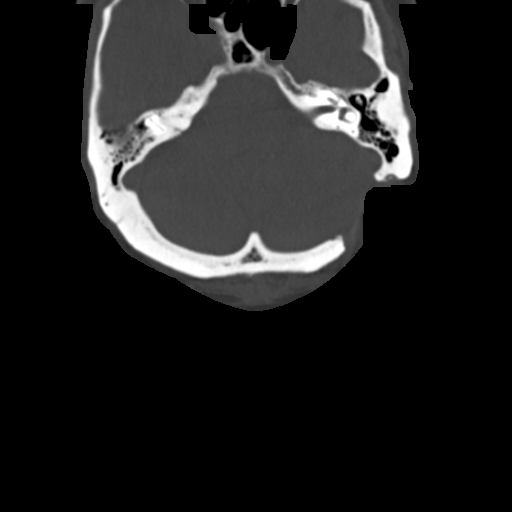

[Series 4: sagittal bone · sagittal · 0.34mm/px · 5 of 71 slices shown, 6 images]
[im 24/71  bone]
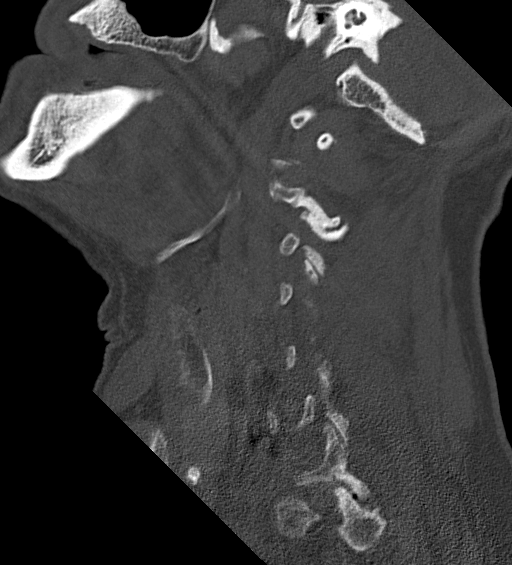
[im 30/71  bone]
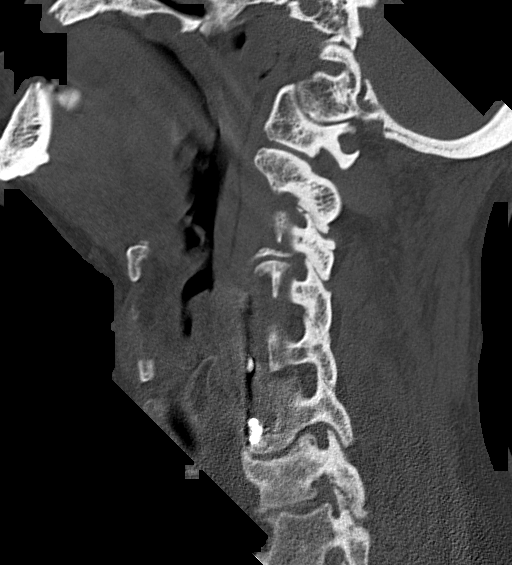
[im 36/71  soft-tissue]
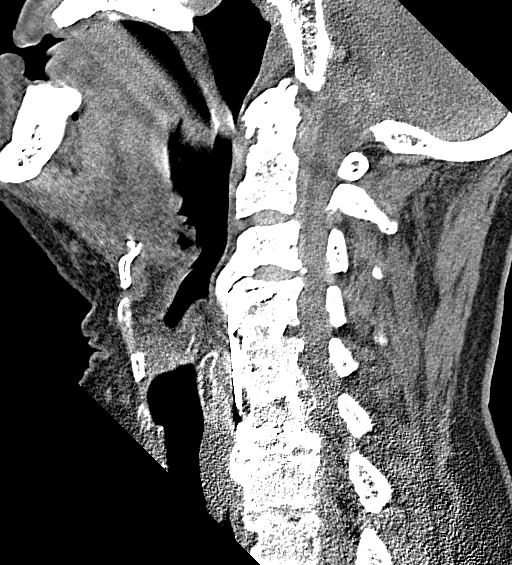
[im 36/71  bone]
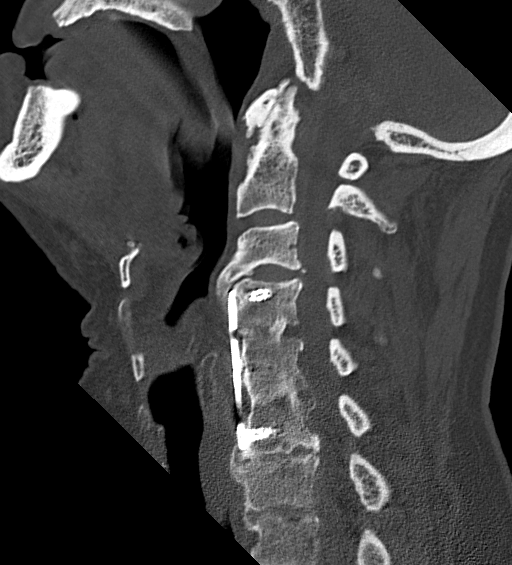
[im 41/71  bone]
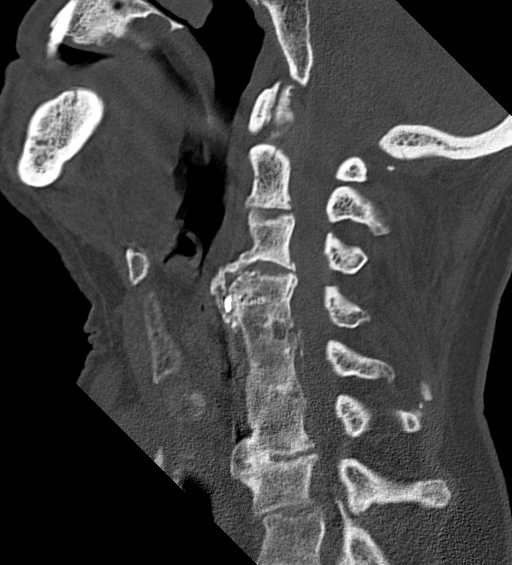
[im 47/71  bone]
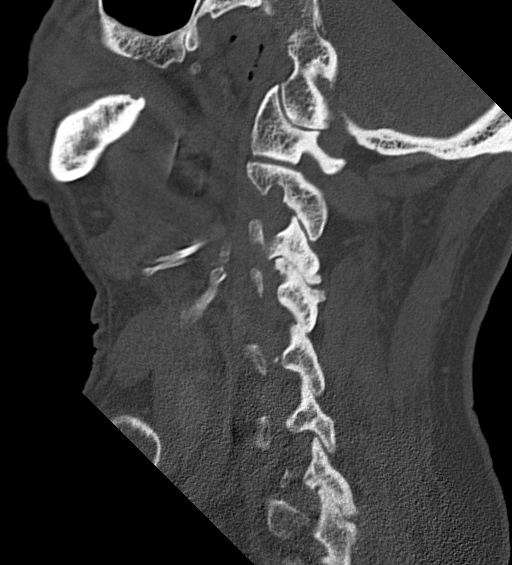

[14 of 27 positions shown; findings below may reference images not displayed]

FINDINGS: CT HEAD FINDINGS

Brain:

Patchy and confluent areas of decreased attenuation are noted
throughout the deep and periventricular white matter of the cerebral
hemispheres bilaterally, compatible with chronic microvascular
ischemic disease. Redemonstration of left cerebellar hypodensity.

No evidence of large-territorial acute infarction. No parenchymal
hemorrhage. No mass lesion. No extra-axial collection.

No mass effect or midline shift. No hydrocephalus. Basilar cisterns
are patent.

Vascular: No hyperdense vessel.

Skull: No acute fracture or focal lesion. Prior left sub-occipital
craniotomy.

Sinuses/Orbits: Paranasal sinuses and mastoid air cells are clear.
The orbits are unremarkable.

Other: None.

CT CERVICAL SPINE FINDINGS

Alignment: Normal.

Skull base and vertebrae: Surgical changes related to anterior C4
through C6 fusion. No definite hardware fracture or surrounding
lucency. No prior images to compare to. No acute displaced fracture.
No aggressive appearing focal osseous lesion or focal pathologic
process.

Soft tissues and spinal canal: No prevertebral fluid or swelling. No
visible canal hematoma.

Disc levels:  Maintained.

Upper chest: Right apical paraseptal emphysematous changes as well
as/pulmonary scarring.

Other: None.
IMPRESSION: 1. No acute intracranial abnormality.
2. No acute fracture or traumatic listhesis of the cervical spine.

## 2021-10-15 NOTE — Progress Notes (Signed)
Remote pacemaker transmission.   

## 2021-10-20 ENCOUNTER — Telehealth: Payer: Self-pay

## 2021-10-20 NOTE — Telephone Encounter (Signed)
Patient called and stated that the provider put himon bupropion for smoking cessation and Wellbutrin for anxiety, he stated he takes a whole pill of the trazadone and he is not sleeping and he is having crazy thoughts and he does not know which one is causing this and he is also off-balance.  I informed him to try and take 1/2 pill of the trazadone instead of a whole pill and instead of taking the wellbutrin 2 times a day try taking 1 pill a day and see if it helps and if it does not help please call and let us know. He understood.  Ryo Klang,cma

## 2021-10-29 ENCOUNTER — Telehealth: Payer: Self-pay

## 2021-10-29 NOTE — Telephone Encounter (Signed)
I called and LVM for the patient to call back and schedule a follow up appointment in February with the provider and 1 week before he needs to schedule a lab appointment as well. labs are ordered.  Reighlyn Elmes,cma

## 2021-10-29 NOTE — Telephone Encounter (Signed)
-----   Message from Zachary Santos, [place] sent at 09/25/2021  1:17 PM EST ----- Call patient to make sure he is scheduled for lab and follow up

## 2021-11-06 IMAGING — DX DG CHEST 1V PORT
1 series · 1 of 1 positions shown · non-contrast
Comparison: Chest radiograph dated 10/08/2020.

CLINICAL DATA: 84-year-old male with cough

EXAM:
PORTABLE CHEST 1 VIEW

[chest ap]
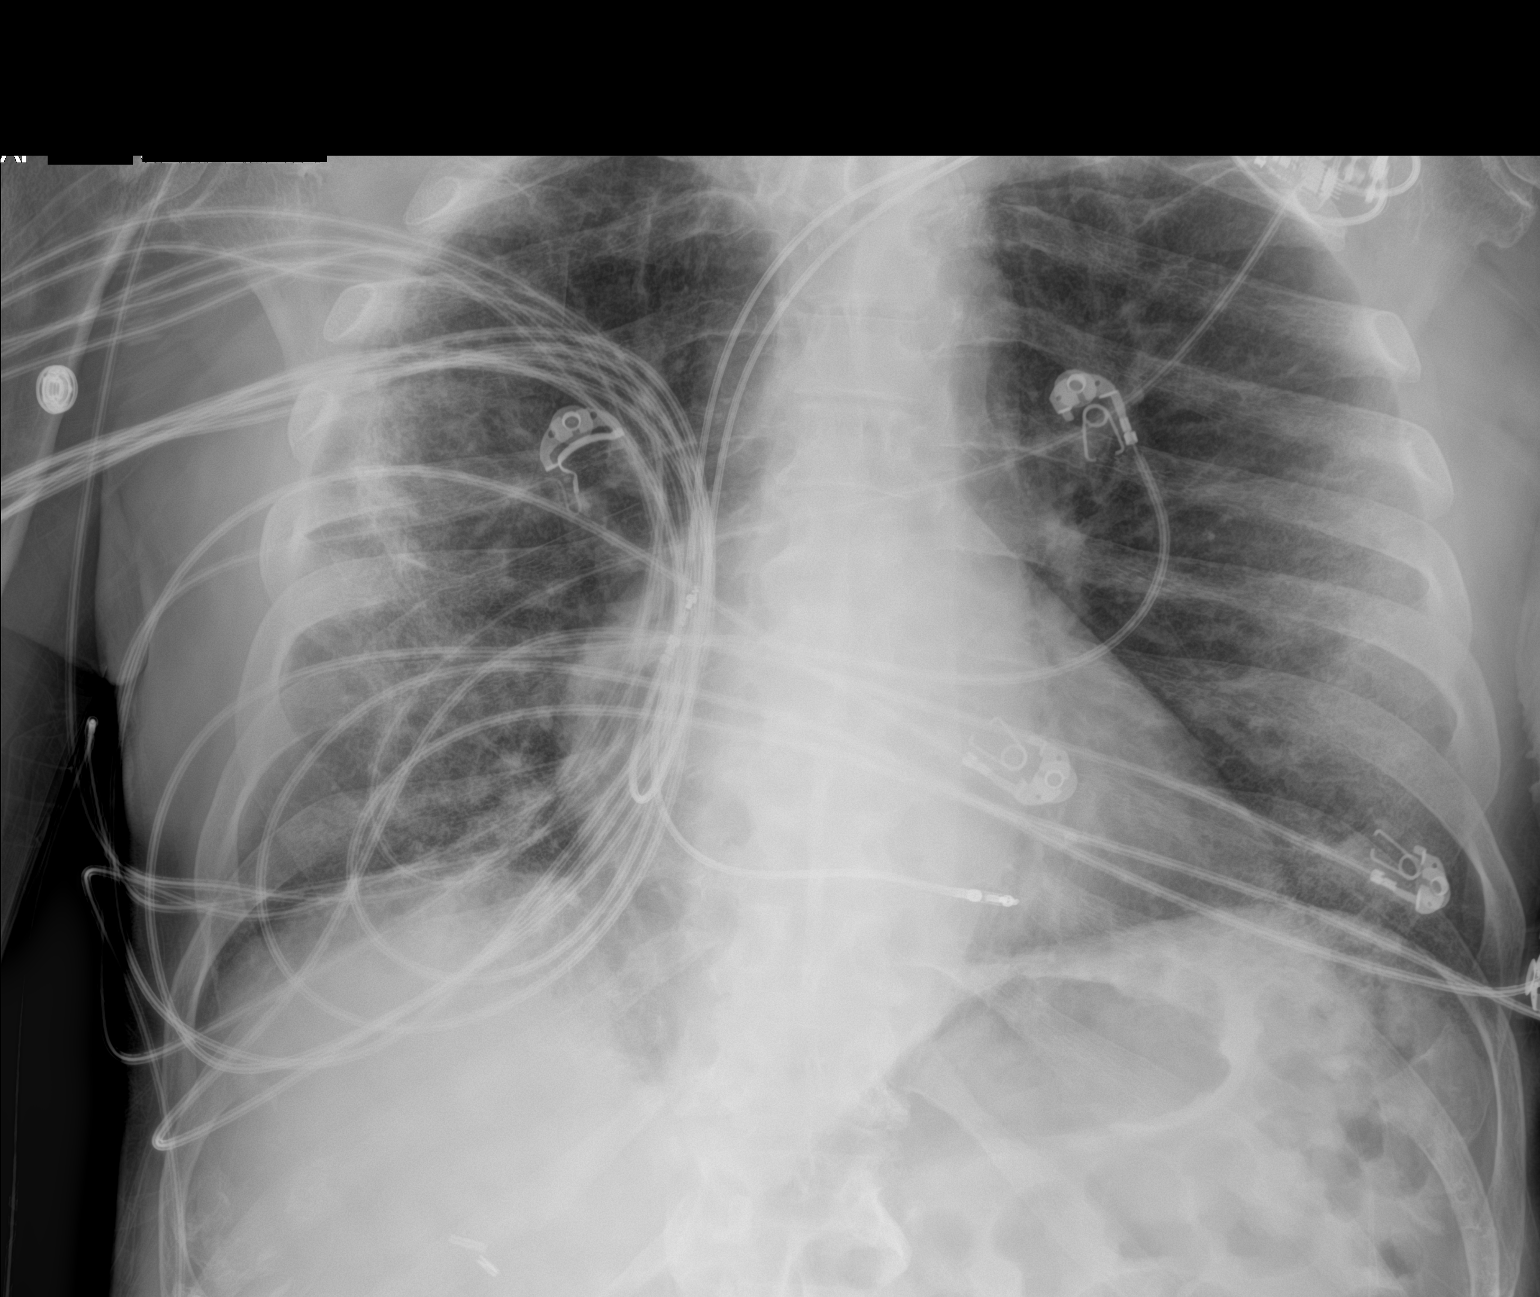

[1 of 1 positions shown; findings below may reference images not displayed]

FINDINGS: Bilateral peripheral and subpleural streaky densities, likely
sequela of atypical infection including [id number]. Clinical
correlation is recommended. No pleural effusion pneumothorax. Mild
cardiomegaly. Left pectoral pacemaker device. No acute osseous
pathology.
IMPRESSION: Bilateral peripheral and subpleural streaky densities, likely
sequela of atypical infection. Clinical correlation is recommended.

## 2021-12-28 DIAGNOSIS — M1990 Unspecified osteoarthritis, unspecified site: Secondary | ICD-10-CM | POA: Diagnosis not present

## 2021-12-28 DIAGNOSIS — Z7901 Long term (current) use of anticoagulants: Secondary | ICD-10-CM | POA: Diagnosis not present

## 2021-12-28 DIAGNOSIS — N183 Chronic kidney disease, stage 3 unspecified: Secondary | ICD-10-CM | POA: Diagnosis not present

## 2021-12-28 DIAGNOSIS — Z7984 Long term (current) use of oral hypoglycemic drugs: Secondary | ICD-10-CM | POA: Diagnosis not present

## 2021-12-28 DIAGNOSIS — E119 Type 2 diabetes mellitus without complications: Secondary | ICD-10-CM | POA: Diagnosis not present

## 2021-12-28 DIAGNOSIS — Z950 Presence of cardiac pacemaker: Secondary | ICD-10-CM | POA: Diagnosis not present

## 2021-12-28 DIAGNOSIS — J3089 Other allergic rhinitis: Secondary | ICD-10-CM | POA: Diagnosis not present

## 2021-12-28 DIAGNOSIS — I10 Essential (primary) hypertension: Secondary | ICD-10-CM | POA: Diagnosis not present

## 2021-12-28 DIAGNOSIS — G4709 Other insomnia: Secondary | ICD-10-CM | POA: Diagnosis not present

## 2021-12-28 DIAGNOSIS — E7800 Pure hypercholesterolemia, unspecified: Secondary | ICD-10-CM | POA: Diagnosis not present

## 2021-12-28 DIAGNOSIS — E1122 Type 2 diabetes mellitus with diabetic chronic kidney disease: Secondary | ICD-10-CM | POA: Diagnosis not present

## 2022-01-06 ENCOUNTER — Ambulatory Visit (INDEPENDENT_AMBULATORY_CARE_PROVIDER_SITE_OTHER): Payer: Medicare Other

## 2022-01-06 DIAGNOSIS — I442 Atrioventricular block, complete: Secondary | ICD-10-CM | POA: Diagnosis not present

## 2022-01-06 LAB — CUP PACEART REMOTE DEVICE CHECK
Battery Remaining Longevity: 53 mo
Battery Remaining Percentage: 82 %
Battery Voltage: 2.98 V
Brady Statistic AP VP Percent: 73 %
Brady Statistic AP VS Percent: 1 %
Brady Statistic AS VP Percent: 26 %
Brady Statistic AS VS Percent: 1 %
Brady Statistic RA Percent Paced: 73 %
Brady Statistic RV Percent Paced: 99 %
Date Time Interrogation Session: 20230322035416
Implantable Lead Implant Date: 20211222
Implantable Lead Implant Date: 20211222
Implantable Lead Location: 753859
Implantable Lead Location: 753860
Implantable Pulse Generator Implant Date: 20211222
Lead Channel Impedance Value: 460 Ohm
Lead Channel Impedance Value: 680 Ohm
Lead Channel Pacing Threshold Amplitude: 0.5 V
Lead Channel Pacing Threshold Amplitude: 0.75 V
Lead Channel Pacing Threshold Pulse Width: 0.5 ms
Lead Channel Pacing Threshold Pulse Width: 0.5 ms
Lead Channel Sensing Intrinsic Amplitude: 2.1 mV
Lead Channel Sensing Intrinsic Amplitude: 8.8 mV
Lead Channel Setting Pacing Amplitude: 3.5 V
Lead Channel Setting Pacing Amplitude: 3.5 V
Lead Channel Setting Pacing Pulse Width: 0.5 ms
Lead Channel Setting Sensing Sensitivity: 2 mV
Pulse Gen Model: 2272
Pulse Gen Serial Number: 3874736

## 2022-01-11 DIAGNOSIS — E119 Type 2 diabetes mellitus without complications: Secondary | ICD-10-CM | POA: Diagnosis not present

## 2022-01-11 DIAGNOSIS — G4709 Other insomnia: Secondary | ICD-10-CM | POA: Diagnosis not present

## 2022-01-11 DIAGNOSIS — F17210 Nicotine dependence, cigarettes, uncomplicated: Secondary | ICD-10-CM | POA: Diagnosis not present

## 2022-01-11 DIAGNOSIS — R531 Weakness: Secondary | ICD-10-CM | POA: Diagnosis not present

## 2022-01-19 NOTE — Progress Notes (Signed)
Remote pacemaker transmission.   

## 2022-02-08 DIAGNOSIS — E7800 Pure hypercholesterolemia, unspecified: Secondary | ICD-10-CM | POA: Diagnosis not present

## 2022-02-08 DIAGNOSIS — G4709 Other insomnia: Secondary | ICD-10-CM | POA: Diagnosis not present

## 2022-02-08 DIAGNOSIS — Z7189 Other specified counseling: Secondary | ICD-10-CM | POA: Diagnosis not present

## 2022-02-08 DIAGNOSIS — E119 Type 2 diabetes mellitus without complications: Secondary | ICD-10-CM | POA: Diagnosis not present

## 2022-02-08 DIAGNOSIS — E559 Vitamin D deficiency, unspecified: Secondary | ICD-10-CM | POA: Diagnosis not present

## 2022-02-08 DIAGNOSIS — Z7984 Long term (current) use of oral hypoglycemic drugs: Secondary | ICD-10-CM | POA: Diagnosis not present

## 2022-02-08 DIAGNOSIS — Z7901 Long term (current) use of anticoagulants: Secondary | ICD-10-CM | POA: Diagnosis not present

## 2022-02-08 DIAGNOSIS — M1990 Unspecified osteoarthritis, unspecified site: Secondary | ICD-10-CM | POA: Diagnosis not present

## 2022-02-15 ENCOUNTER — Other Ambulatory Visit: Payer: Self-pay | Admitting: Family Medicine

## 2022-02-15 DIAGNOSIS — I480 Paroxysmal atrial fibrillation: Secondary | ICD-10-CM

## 2022-02-16 NOTE — Telephone Encounter (Signed)
Sent to pharmacy. The patient needs follow-up scheduled.  ?

## 2022-02-16 NOTE — Telephone Encounter (Signed)
Okay to refill? Last seen in November 2022 ?

## 2022-03-02 DIAGNOSIS — G4709 Other insomnia: Secondary | ICD-10-CM | POA: Diagnosis not present

## 2022-03-02 DIAGNOSIS — E119 Type 2 diabetes mellitus without complications: Secondary | ICD-10-CM | POA: Diagnosis not present

## 2022-03-02 DIAGNOSIS — Z7189 Other specified counseling: Secondary | ICD-10-CM | POA: Diagnosis not present

## 2022-03-02 DIAGNOSIS — Z7984 Long term (current) use of oral hypoglycemic drugs: Secondary | ICD-10-CM | POA: Diagnosis not present

## 2022-03-16 DIAGNOSIS — F17218 Nicotine dependence, cigarettes, with other nicotine-induced disorders: Secondary | ICD-10-CM | POA: Diagnosis not present

## 2022-03-16 DIAGNOSIS — Z7189 Other specified counseling: Secondary | ICD-10-CM | POA: Diagnosis not present

## 2022-03-16 DIAGNOSIS — I482 Chronic atrial fibrillation, unspecified: Secondary | ICD-10-CM | POA: Diagnosis not present

## 2022-03-16 DIAGNOSIS — E119 Type 2 diabetes mellitus without complications: Secondary | ICD-10-CM | POA: Diagnosis not present

## 2022-03-16 DIAGNOSIS — G4709 Other insomnia: Secondary | ICD-10-CM | POA: Diagnosis not present

## 2022-03-30 DIAGNOSIS — Z7189 Other specified counseling: Secondary | ICD-10-CM | POA: Diagnosis not present

## 2022-03-30 DIAGNOSIS — Z7901 Long term (current) use of anticoagulants: Secondary | ICD-10-CM | POA: Diagnosis not present

## 2022-03-30 DIAGNOSIS — E7800 Pure hypercholesterolemia, unspecified: Secondary | ICD-10-CM | POA: Diagnosis not present

## 2022-03-30 DIAGNOSIS — G4709 Other insomnia: Secondary | ICD-10-CM | POA: Diagnosis not present

## 2022-03-30 DIAGNOSIS — I10 Essential (primary) hypertension: Secondary | ICD-10-CM | POA: Diagnosis not present

## 2022-03-30 DIAGNOSIS — Z7984 Long term (current) use of oral hypoglycemic drugs: Secondary | ICD-10-CM | POA: Diagnosis not present

## 2022-03-30 DIAGNOSIS — E119 Type 2 diabetes mellitus without complications: Secondary | ICD-10-CM | POA: Diagnosis not present

## 2022-04-06 DIAGNOSIS — E119 Type 2 diabetes mellitus without complications: Secondary | ICD-10-CM | POA: Diagnosis not present

## 2022-04-06 DIAGNOSIS — Z7984 Long term (current) use of oral hypoglycemic drugs: Secondary | ICD-10-CM | POA: Diagnosis not present

## 2022-04-06 DIAGNOSIS — F17210 Nicotine dependence, cigarettes, uncomplicated: Secondary | ICD-10-CM | POA: Diagnosis not present

## 2022-04-06 DIAGNOSIS — Z7901 Long term (current) use of anticoagulants: Secondary | ICD-10-CM | POA: Diagnosis not present

## 2022-04-06 DIAGNOSIS — R531 Weakness: Secondary | ICD-10-CM | POA: Diagnosis not present

## 2022-04-06 DIAGNOSIS — I482 Chronic atrial fibrillation, unspecified: Secondary | ICD-10-CM | POA: Diagnosis not present

## 2022-04-07 ENCOUNTER — Ambulatory Visit (INDEPENDENT_AMBULATORY_CARE_PROVIDER_SITE_OTHER): Payer: Medicare Other

## 2022-04-07 DIAGNOSIS — I442 Atrioventricular block, complete: Secondary | ICD-10-CM | POA: Diagnosis not present

## 2022-04-08 LAB — CUP PACEART REMOTE DEVICE CHECK
Battery Remaining Longevity: 50 mo
Battery Remaining Percentage: 78 %
Battery Voltage: 2.98 V
Brady Statistic AP VP Percent: 75 %
Brady Statistic AP VS Percent: 1 %
Brady Statistic AS VP Percent: 24 %
Brady Statistic AS VS Percent: 1 %
Brady Statistic RA Percent Paced: 74 %
Brady Statistic RV Percent Paced: 99 %
Date Time Interrogation Session: 20230621020015
Implantable Lead Implant Date: 20211222
Implantable Lead Implant Date: 20211222
Implantable Lead Location: 753859
Implantable Lead Location: 753860
Implantable Pulse Generator Implant Date: 20211222
Lead Channel Impedance Value: 490 Ohm
Lead Channel Impedance Value: 700 Ohm
Lead Channel Pacing Threshold Amplitude: 0.5 V
Lead Channel Pacing Threshold Amplitude: 0.75 V
Lead Channel Pacing Threshold Pulse Width: 0.5 ms
Lead Channel Pacing Threshold Pulse Width: 0.5 ms
Lead Channel Sensing Intrinsic Amplitude: 3.1 mV
Lead Channel Sensing Intrinsic Amplitude: 9.1 mV
Lead Channel Setting Pacing Amplitude: 3.5 V
Lead Channel Setting Pacing Amplitude: 3.5 V
Lead Channel Setting Pacing Pulse Width: 0.5 ms
Lead Channel Setting Sensing Sensitivity: 2 mV
Pulse Gen Model: 2272
Pulse Gen Serial Number: 3874736

## 2022-04-19 NOTE — Progress Notes (Signed)
Remote pacemaker transmission.   

## 2022-05-18 ENCOUNTER — Other Ambulatory Visit: Payer: Self-pay

## 2022-05-18 NOTE — Patient Outreach (Signed)
  Care Coordination   Initial Visit Note   05/18/2022 Name: Zachary Santos MRN: [id number] DOB: 22-Apr-1936  Zachary Santos is a 86 y.o. year old male who sees Caryl Bis, Angela Adam, MD for primary care. I spoke with  Marjory Sneddon by phone today  What matters to the patients health and wellness today?  Patient states he does not have any needs or concerns at this time. He states he has a nurse that comes out to see him on a regular basis and he does not have to go in to a provider office the doctor comes out to his home.    Goals Addressed   None     SDOH assessments and interventions completed:   Yes   Care Coordination Interventions Activated:  No Care Coordination Interventions:  No, not indicated  Follow up plan:  No additional follow up needed due to patient stating his needs are met at this time.   Encounter Outcome:  Pt. Refused  Quinn Plowman RN,BSN,CCM RN Care Manager Coordinator [phone]

## 2022-05-24 ENCOUNTER — Telehealth: Payer: Self-pay | Admitting: Family Medicine

## 2022-05-24 NOTE — Telephone Encounter (Signed)
Copied from [place] [id number]. Topic: Medicare AWV >> May 24, 2022 11:42 AM Devoria Glassing wrote: Reason for CRM: Attempted to schedule AWV. Unable to LVM.  Will try at later time.

## 2022-07-07 ENCOUNTER — Ambulatory Visit (INDEPENDENT_AMBULATORY_CARE_PROVIDER_SITE_OTHER): Payer: Medicare HMO

## 2022-07-07 DIAGNOSIS — I442 Atrioventricular block, complete: Secondary | ICD-10-CM | POA: Diagnosis not present

## 2022-07-07 LAB — CUP PACEART REMOTE DEVICE CHECK
Battery Remaining Longevity: 47 mo
Battery Remaining Percentage: 74 %
Battery Voltage: 2.98 V
Brady Statistic AP VP Percent: 74 %
Brady Statistic AP VS Percent: 1 %
Brady Statistic AS VP Percent: 23 %
Brady Statistic AS VS Percent: 1 %
Brady Statistic RA Percent Paced: 72 %
Brady Statistic RV Percent Paced: 98 %
Date Time Interrogation Session: 20230920020013
Implantable Lead Implant Date: 20211222
Implantable Lead Implant Date: 20211222
Implantable Lead Location: 753859
Implantable Lead Location: 753860
Implantable Pulse Generator Implant Date: 20211222
Lead Channel Impedance Value: 480 Ohm
Lead Channel Impedance Value: 640 Ohm
Lead Channel Pacing Threshold Amplitude: 0.5 V
Lead Channel Pacing Threshold Amplitude: 0.75 V
Lead Channel Pacing Threshold Pulse Width: 0.5 ms
Lead Channel Pacing Threshold Pulse Width: 0.5 ms
Lead Channel Sensing Intrinsic Amplitude: 4.3 mV
Lead Channel Sensing Intrinsic Amplitude: 7.8 mV
Lead Channel Setting Pacing Amplitude: 3.5 V
Lead Channel Setting Pacing Amplitude: 3.5 V
Lead Channel Setting Pacing Pulse Width: 0.5 ms
Lead Channel Setting Sensing Sensitivity: 2 mV
Pulse Gen Model: 2272
Pulse Gen Serial Number: 3874736

## 2022-07-19 NOTE — Progress Notes (Signed)
Remote pacemaker transmission.   

## 2022-08-09 ENCOUNTER — Telehealth: Payer: Self-pay

## 2022-08-09 NOTE — Telephone Encounter (Signed)
Patient called in stating he has been getting tired quickly lately and thinks his rate on his ppm needs to be changed. Patient is sending a transmission

## 2022-08-19 ENCOUNTER — Telehealth: Payer: Self-pay | Admitting: Family Medicine

## 2022-08-19 NOTE — Telephone Encounter (Signed)
Copied from [place] [id number]. Topic: Medicare AWV >> Aug 19, 2022 10:17 AM Devoria Glassing wrote: Reason for CRM:  Called patient to schedule Annual Wellness Visit.  Please schedule with Nurse Health Advisor Denisa O'Brien-Blaney, LPN at [facility] This appt can be telephone or office visit.  Please call [phone] ask for [facility]

## 2022-08-20 ENCOUNTER — Telehealth: Payer: Self-pay | Admitting: Family Medicine

## 2022-08-20 NOTE — Telephone Encounter (Signed)
Copied from [place] [id number]. Topic: Medicare AWV >> Aug 19, 2022 10:17 AM Devoria Glassing wrote: Reason for CRM:  Called patient to schedule Annual Wellness Visit.  Please schedule with Nurse Health Advisor Denisa O'Brien-Blaney, LPN at [facility]. This appt can be telephone or office visit.  Please call [phone] ask for [facility]

## 2022-09-06 ENCOUNTER — Telehealth: Payer: Self-pay | Admitting: Family Medicine

## 2022-09-06 NOTE — Telephone Encounter (Signed)
Copied from [place] [id number]. Topic: Medicare AWV >> Sep 06, 2022  9:52 AM Devoria Glassing wrote: Reason for CRM: Left message for patient to schedule Annual Wellness Visit.  Please schedule with Nurse Health Advisor Denisa O'Brien-Blaney, LPN at [facility] This appt can be telephone or office visit.  Please call [phone] ask for [facility]

## 2022-09-08 ENCOUNTER — Ambulatory Visit: Payer: Medicare HMO | Admitting: Cardiology

## 2022-09-14 ENCOUNTER — Observation Stay
Admission: EM | Admit: 2022-09-14 | Discharge: 2022-09-15 | Disposition: A | Discharge status: Home / Self Care | Payer: Medicare HMO | Attending: Osteopathic Medicine | Admitting: Osteopathic Medicine

## 2022-09-14 ENCOUNTER — Emergency Department: Payer: Medicare HMO

## 2022-09-14 ENCOUNTER — Encounter: Payer: Self-pay | Admitting: Emergency Medicine

## 2022-09-14 ENCOUNTER — Other Ambulatory Visit: Payer: Self-pay

## 2022-09-14 DIAGNOSIS — Z7901 Long term (current) use of anticoagulants: Secondary | ICD-10-CM | POA: Diagnosis not present

## 2022-09-14 DIAGNOSIS — R7402 Elevation of levels of lactic acid dehydrogenase (LDH): Secondary | ICD-10-CM | POA: Diagnosis not present

## 2022-09-14 DIAGNOSIS — F17210 Nicotine dependence, cigarettes, uncomplicated: Secondary | ICD-10-CM | POA: Diagnosis not present

## 2022-09-14 DIAGNOSIS — Z79899 Other long term (current) drug therapy: Secondary | ICD-10-CM | POA: Diagnosis not present

## 2022-09-14 DIAGNOSIS — F32A Depression, unspecified: Secondary | ICD-10-CM | POA: Diagnosis present

## 2022-09-14 DIAGNOSIS — E1122 Type 2 diabetes mellitus with diabetic chronic kidney disease: Secondary | ICD-10-CM | POA: Insufficient documentation

## 2022-09-14 DIAGNOSIS — E876 Hypokalemia: Secondary | ICD-10-CM | POA: Insufficient documentation

## 2022-09-14 DIAGNOSIS — J9601 Acute respiratory failure with hypoxia: Secondary | ICD-10-CM | POA: Insufficient documentation

## 2022-09-14 DIAGNOSIS — E1165 Type 2 diabetes mellitus with hyperglycemia: Secondary | ICD-10-CM | POA: Diagnosis present

## 2022-09-14 DIAGNOSIS — J441 Chronic obstructive pulmonary disease with (acute) exacerbation: Principal | ICD-10-CM | POA: Diagnosis present

## 2022-09-14 DIAGNOSIS — Z1152 Encounter for screening for COVID-19: Secondary | ICD-10-CM | POA: Insufficient documentation

## 2022-09-14 DIAGNOSIS — R0602 Shortness of breath: Secondary | ICD-10-CM | POA: Diagnosis present

## 2022-09-14 DIAGNOSIS — I129 Hypertensive chronic kidney disease with stage 1 through stage 4 chronic kidney disease, or unspecified chronic kidney disease: Secondary | ICD-10-CM | POA: Diagnosis not present

## 2022-09-14 DIAGNOSIS — G4700 Insomnia, unspecified: Secondary | ICD-10-CM | POA: Diagnosis not present

## 2022-09-14 DIAGNOSIS — R0600 Dyspnea, unspecified: Secondary | ICD-10-CM | POA: Diagnosis not present

## 2022-09-14 DIAGNOSIS — N183 Chronic kidney disease, stage 3 unspecified: Secondary | ICD-10-CM | POA: Diagnosis not present

## 2022-09-14 DIAGNOSIS — E1169 Type 2 diabetes mellitus with other specified complication: Secondary | ICD-10-CM

## 2022-09-14 DIAGNOSIS — Z7984 Long term (current) use of oral hypoglycemic drugs: Secondary | ICD-10-CM | POA: Insufficient documentation

## 2022-09-14 DIAGNOSIS — Z950 Presence of cardiac pacemaker: Secondary | ICD-10-CM | POA: Insufficient documentation

## 2022-09-14 DIAGNOSIS — I482 Chronic atrial fibrillation, unspecified: Secondary | ICD-10-CM | POA: Diagnosis present

## 2022-09-14 DIAGNOSIS — I10 Essential (primary) hypertension: Secondary | ICD-10-CM | POA: Diagnosis present

## 2022-09-14 DIAGNOSIS — E785 Hyperlipidemia, unspecified: Secondary | ICD-10-CM | POA: Diagnosis present

## 2022-09-14 DIAGNOSIS — R7989 Other specified abnormal findings of blood chemistry: Secondary | ICD-10-CM | POA: Insufficient documentation

## 2022-09-14 DIAGNOSIS — F419 Anxiety disorder, unspecified: Secondary | ICD-10-CM | POA: Diagnosis present

## 2022-09-14 LAB — COMPREHENSIVE METABOLIC PANEL
ALT: 14 U/L (ref 0–44)
AST: 18 U/L (ref 15–41)
Albumin: 4.4 g/dL (ref 3.5–5.0)
Alkaline Phosphatase: 135 U/L — ABNORMAL HIGH (ref 38–126)
Anion gap: 8 (ref 5–15)
BUN: 14 mg/dL (ref 8–23)
CO2: 25 mmol/L (ref 22–32)
Calcium: 9.1 mg/dL (ref 8.9–10.3)
Chloride: 107 mmol/L (ref 98–111)
Creatinine, Ser: 1.39 mg/dL — ABNORMAL HIGH (ref 0.61–1.24)
GFR, Estimated: 50 mL/min — ABNORMAL LOW (ref >60)
Glucose, Bld: 164 mg/dL — ABNORMAL HIGH (ref 70–99)
Potassium: 3.3 mmol/L — ABNORMAL LOW (ref 3.5–5.1)
Sodium: 140 mmol/L (ref 135–145)
Total Bilirubin: 1 mg/dL (ref 0.3–1.2)
Total Protein: 7.1 g/dL (ref 6.5–8.1)

## 2022-09-14 LAB — TROPONIN I (HIGH SENSITIVITY)
Troponin I (High Sensitivity): 19 ng/L — ABNORMAL HIGH (ref <18)
Troponin I (High Sensitivity): 21 ng/L — ABNORMAL HIGH (ref <18)

## 2022-09-14 LAB — MAGNESIUM: Magnesium: 2.1 mg/dL (ref 1.7–2.4)

## 2022-09-14 LAB — CBG MONITORING, ED
Glucose-Capillary: 154 mg/dL — ABNORMAL HIGH (ref 70–99)
Glucose-Capillary: 289 mg/dL — ABNORMAL HIGH (ref 70–99)

## 2022-09-14 LAB — CBC
HCT: 41.6 % (ref 39.0–52.0)
Hemoglobin: 13.8 g/dL (ref 13.0–17.0)
MCH: 30.5 pg (ref 26.0–34.0)
MCHC: 33.2 g/dL (ref 30.0–36.0)
MCV: 92 fL (ref 80.0–100.0)
Platelets: 163 10*3/uL (ref 150–400)
RBC: 4.52 MIL/uL (ref 4.22–5.81)
RDW: 13.7 % (ref 11.5–15.5)
WBC: 8 10*3/uL (ref 4.0–10.5)
nRBC: 0 % (ref 0.0–0.2)

## 2022-09-14 LAB — RESP PANEL BY RT-PCR (FLU A&B, COVID) ARPGX2
Influenza A by PCR: NEGATIVE
Influenza B by PCR: NEGATIVE
SARS Coronavirus 2 by RT PCR: NEGATIVE

## 2022-09-14 LAB — GLUCOSE, CAPILLARY: Glucose-Capillary: 244 mg/dL — ABNORMAL HIGH (ref 70–99)

## 2022-09-14 LAB — LACTIC ACID, PLASMA
Lactic Acid, Venous: 1.4 mmol/L (ref 0.5–1.9)
Lactic Acid, Venous: 2 mmol/L (ref 0.5–1.9)
Lactic Acid, Venous: 5.1 mmol/L (ref 0.5–1.9)

## 2022-09-14 LAB — PROCALCITONIN: Procalcitonin: <0.1 ng/mL

## 2022-09-14 MED ORDER — APIXABAN 5 MG PO TABS
5.0000 mg | ORAL_TABLET | Freq: Two times a day (BID) | ORAL | Status: DC
  Administered 2022-09-14 – 2022-09-15 (×2): 5 mg via ORAL
  Filled 2022-09-14 (×2): qty 1

## 2022-09-14 MED ORDER — POTASSIUM CITRATE-CITRIC ACID 1100-334 MG/5ML PO SOLN
20.0000 meq | Freq: Every day | ORAL | Status: DC
  Filled 2022-09-14 (×2): qty 10

## 2022-09-14 MED ORDER — HYDRALAZINE HCL 10 MG PO TABS: 10.0000 mg | ORAL_TABLET | Freq: Four times a day (QID) | ORAL | Status: DC | PRN

## 2022-09-14 MED ORDER — SODIUM CHLORIDE 0.9 % IV SOLN
500.0000 mg | INTRAVENOUS | Status: AC
  Administered 2022-09-14: 500 mg via INTRAVENOUS
  Filled 2022-09-14: qty 500

## 2022-09-14 MED ORDER — LORAZEPAM 2 MG/ML IJ SOLN: 0.5000 mg | Freq: Four times a day (QID) | INTRAMUSCULAR | Status: DC | PRN

## 2022-09-14 MED ORDER — TRAZODONE HCL 50 MG PO TABS: 25.0000 mg | ORAL_TABLET | Freq: Every evening | ORAL | Status: DC | PRN

## 2022-09-14 MED ORDER — INSULIN ASPART 100 UNIT/ML IJ SOLN
0.0000 [IU] | Freq: Every day | INTRAMUSCULAR | Status: DC
  Administered 2022-09-14: 2 [IU] via SUBCUTANEOUS
  Filled 2022-09-14: qty 1

## 2022-09-14 MED ORDER — AMIODARONE HCL 200 MG PO TABS
200.0000 mg | ORAL_TABLET | Freq: Every day | ORAL | Status: DC
  Administered 2022-09-14 – 2022-09-15 (×2): 200 mg via ORAL
  Filled 2022-09-14 (×2): qty 1

## 2022-09-14 MED ORDER — AZELASTINE HCL 0.1 % NA SOLN
2.0000 | Freq: Two times a day (BID) | NASAL | Status: DC | PRN
  Filled 2022-09-14: qty 30

## 2022-09-14 MED ORDER — ONDANSETRON HCL 4 MG PO TABS: 4.0000 mg | ORAL_TABLET | Freq: Four times a day (QID) | ORAL | Status: DC | PRN

## 2022-09-14 MED ORDER — LISINOPRIL 10 MG PO TABS
10.0000 mg | ORAL_TABLET | Freq: Every day | ORAL | Status: DC
  Administered 2022-09-14 – 2022-09-15 (×2): 10 mg via ORAL
  Filled 2022-09-14 (×2): qty 1

## 2022-09-14 MED ORDER — METHYLPREDNISOLONE SODIUM SUCC 40 MG IJ SOLR
40.0000 mg | Freq: Two times a day (BID) | INTRAMUSCULAR | Status: DC
  Administered 2022-09-15: 40 mg via INTRAVENOUS
  Filled 2022-09-14: qty 1

## 2022-09-14 MED ORDER — ACETAMINOPHEN 325 MG PO TABS: 650.0000 mg | ORAL_TABLET | Freq: Four times a day (QID) | ORAL | Status: DC | PRN

## 2022-09-14 MED ORDER — MELATONIN 5 MG PO TABS: 5.0000 mg | ORAL_TABLET | Freq: Every evening | ORAL | Status: DC | PRN

## 2022-09-14 MED ORDER — SODIUM CHLORIDE 0.9 % IV SOLN
2.0000 g | INTRAVENOUS | Status: AC
  Administered 2022-09-14: 2 g via INTRAVENOUS
  Filled 2022-09-14: qty 20

## 2022-09-14 MED ORDER — NICOTINE 21 MG/24HR TD PT24: 21.0000 mg | MEDICATED_PATCH | Freq: Every day | TRANSDERMAL | Status: DC | PRN

## 2022-09-14 MED ORDER — METHYLPREDNISOLONE SODIUM SUCC 125 MG IJ SOLR
125.0000 mg | Freq: Once | INTRAMUSCULAR | Status: AC
  Administered 2022-09-14: 125 mg via INTRAVENOUS
  Filled 2022-09-14: qty 2

## 2022-09-14 MED ORDER — ONDANSETRON HCL 4 MG/2ML IJ SOLN: 4.0000 mg | Freq: Four times a day (QID) | INTRAMUSCULAR | Status: DC | PRN

## 2022-09-14 MED ORDER — SENNOSIDES-DOCUSATE SODIUM 8.6-50 MG PO TABS: 1.0000 | ORAL_TABLET | Freq: Every evening | ORAL | Status: DC | PRN

## 2022-09-14 MED ORDER — IPRATROPIUM-ALBUTEROL 0.5-2.5 (3) MG/3ML IN SOLN
3.0000 mL | Freq: Once | RESPIRATORY_TRACT | Status: AC
  Administered 2022-09-14: 3 mL via RESPIRATORY_TRACT
  Filled 2022-09-14: qty 3

## 2022-09-14 MED ORDER — INSULIN ASPART 100 UNIT/ML IJ SOLN
0.0000 [IU] | Freq: Three times a day (TID) | INTRAMUSCULAR | Status: DC
  Administered 2022-09-14: 8 [IU] via SUBCUTANEOUS
  Administered 2022-09-15 (×2): 5 [IU] via SUBCUTANEOUS
  Filled 2022-09-14 (×4): qty 1

## 2022-09-14 MED ORDER — FINASTERIDE 5 MG PO TABS
5.0000 mg | ORAL_TABLET | Freq: Every day | ORAL | Status: DC
  Administered 2022-09-14 – 2022-09-15 (×2): 5 mg via ORAL
  Filled 2022-09-14 (×3): qty 1

## 2022-09-14 MED ORDER — IPRATROPIUM-ALBUTEROL 0.5-2.5 (3) MG/3ML IN SOLN
3.0000 mL | Freq: Three times a day (TID) | RESPIRATORY_TRACT | Status: AC
  Administered 2022-09-14 – 2022-09-15 (×3): 3 mL via RESPIRATORY_TRACT
  Filled 2022-09-14 (×3): qty 3

## 2022-09-14 MED ORDER — ACETAMINOPHEN 650 MG RE SUPP: 650.0000 mg | Freq: Four times a day (QID) | RECTAL | Status: DC | PRN

## 2022-09-14 NOTE — Assessment & Plan Note (Addendum)
Acute hypoxic respiratory failure requiring O2 supplementation - Query upper respiratory infection, as patient's spouse is also being admitted for COPD exacerbation - COVID/influenza A/influenza B PCR were negative - Solu-Medrol 40 mg IV twice daily 09/15/2022, 2 doses ordered - DuoNebs 3 times daily, 3 doses ordered - Continue oxygen supplementation to maintain SpO2 greater than 92% - Admit to telemetry medical, observation - TOC consulted for DME

## 2022-09-14 NOTE — H&P (Addendum)
History and Physical   Zachary Santos HQP:[id number] DOB: 10-30-1935 DOA: 09/14/2022  PCP: Zachary Haven, MD  Patient coming from: Home via EMS  I have personally briefly reviewed patient's old medical records in [facility].  Chief Concern: Shortness of breath  HPI: Mr. Zachary Santos is an 86 year old male with history of nicotine dependence, hypertension, tobacco abuse, non-insulin-dependent diabetes mellitus, anxiety, COPD, no prior oxygen supplementation requirement, who presents emergency department for chief concerns of shortness of breath, productive cough.  Initial vitals in the ED showed temperature of 98, respiration rate of 18, heart rate of 74, blood pressure 163/86, SpO2 of 87% on room air and improved to 100% on 2 L nasal cannula.  Serum sodium showed 140, potassium 3.3, chloride 107, bicarb 25, BUN of 14, serum creatinine of 1.39, GFR 50, nonfasting blood glucose 164, WBC 8.0, hemoglobin 13.8, platelets of 163.  High since troponin was 19.  Lactic acid was 1.4.  COVID/influenza A/influenza B PCR were negative.  Chest x-ray 2 view: Was read as no focal airspace opacity.  A CTA of the chest to assess for PE was initially planned by EDP however patient declined.  Per ED note: Patient received 1 treatment of DuoNeb's per EMS.  ED treatment: DuoNeb one-time dose, Solu-Medrol 125 mg IV one-time dose.  At bedside patient was able to tell me his name, age, he knows he is in the hospital.  He was able to provide full HPI.  He states that over the last week he has been having shortness of breath and cough that is productive.  He states that his wife is also sick as well.  He states the sputum is a thicker white.  He denies fever at home, nausea, vomiting, chest pain, dysuria, hematuria, diarrhea, abdominal pain.  Social history: He lives at home with his wife of over 11 years.  He endorses tobacco use, smoking 1 pack/day and he started more than 50 years ago.  He states he is  not ready to quit smoking.  He denies EtOH and recreational drug use.  He is retired and formerly worked as a IT trainer  ROS: Constitutional: no weight change, no fever ENT/Mouth: no sore throat, no rhinorrhea Eyes: no eye pain, no vision changes Cardiovascular: no chest pain, + dyspnea,  no edema, no palpitations Respiratory: + cough, + sputum, no wheezing Gastrointestinal: no nausea, no vomiting, no diarrhea, no constipation Genitourinary: no urinary incontinence, no dysuria, no hematuria Musculoskeletal: no arthralgias, no myalgias Skin: no skin lesions, no pruritus, Neuro: + weakness, no loss of consciousness, no syncope Psych: no anxiety, no depression, no decrease appetite Heme/Lymph: no bruising, no bleeding  ED Course: Discussed with emergency medicine provider, patient requiring hospitalization for chief concerns of hypoxia, COPD exacerbation.  Assessment/Plan  Principal Problem:   COPD exacerbation (HCC) Active Problems:   Nicotine dependence, cigarettes, uncomplicated   Essential hypertension, benign   Type 2 diabetes mellitus with hyperglycemia, without long-term current use of insulin (HCC)   Dyslipidemia   Anxiety and depression   Insomnia   Atrial fibrillation, chronic (HCC)   PND (paroxysmal nocturnal dyspnea)   Hypokalemia   Elevated lactic acid level   Assessment and Plan:  * COPD exacerbation (HCC) Acute hypoxic respiratory failure requiring O2 supplementation - Query upper respiratory infection, as patient's spouse is also being admitted for COPD exacerbation - COVID/influenza A/influenza B PCR were negative - Solu-Medrol 40 mg IV twice daily 09/15/2022, 2 doses ordered - DuoNebs 3 times daily, 3  doses ordered - Continue oxygen supplementation to maintain SpO2 greater than 92% - Admit to telemetry medical, observation - TOC consulted for DME  Nicotine dependence, cigarettes, uncomplicated - As needed nicotine patch ordered  Elevated lactic  acid level With acute hypoxic respiratory failure requiring O2 supplementation - Etiology workup in progress - Check blood cultures x 2, procalcitonin, check MRSA PCR - Ceftriaxone 2 g IV one-time dose, azithromycin 500 mg IV one-time dose ordered - Recheck lactic acid to ensure downtrending  Hypokalemia - Magnesium checked on admission was within normal limits - Polycitra 20 mill COVID nightly one-time dose ordered  Atrial fibrillation, chronic (HCC) -  Eliquis 5 mg p.o. twice daily and amiodarone 200 mg daily resumed  Insomnia - Trazodone 25-50 mg nightly as needed for sleep ordered  Anxiety and depression - Ativan 0.5 mg IV every 6 hours as needed for anxiety, 3 doses ordered  Dyslipidemia - Patient does not take the rosuvastatin 20 mg daily ordered outpatient, this has not been resumed  Type 2 diabetes mellitus with hyperglycemia, without long-term current use of insulin (HCC) - Home metformin 500 mg in the morning and at bedtime not resumed on admission - Insulin SSI with at bedtime coverage ordered - Goal inpatient blood glucose level is 140-180  Essential hypertension, benign - Lisinopril 10 mg daily resumed - Hydralazine 10 mg p.o. every 6 hours as needed for SBP greater than 175, 4 days ordered  Chart reviewed.   DVT prophylaxis: Eliquis Code Status: DNR Diet: Heart healthy/carb modified Family Communication: No Disposition Plan: Pending clinical course, anticipate discharge on 09/15/2022 Consults called: TOC Admission status: Telemetry medical, observation  Past Medical History:  Diagnosis Date   Anxiety    BPH (benign prostatic hyperplasia)    Chronic fatigue    CKD (chronic kidney disease), stage III ([place])    Claudication ([place])    a. R Hip.   COPD (chronic obstructive pulmonary disease) (HCC)    Dyspnea    with exertion   Essential hypertension    a. 01/2008 Echo: EF 65%, no rwma, mod increased PASP.   Hypercholesterolemia    Osteoarthritis     Paget's disease    Pneumonia due to COVID-19 virus 10/31/2020   Syncope    a. 03/2015 in setting of hypotension.   Tobacco abuse    a. 1-1.5 ppd x 60+ yrs.   Type II diabetes mellitus ([place])    Wears partial dentures    uppers   Past Surgical History:  Procedure Laterality Date   APPENDECTOMY     CATARACT EXTRACTION W/PHACO Left 12/11/2019   Procedure: CATARACT EXTRACTION PHACO AND INTRAOCULAR LENS PLACEMENT ([place]) LEFT DIABETIC MALYUGIN;  Surgeon: Birder Robson, MD;  Location: [facility];  Service: Ophthalmology;  Laterality: Left;  CDE 11.45 U/S  1:05.7   CATARACT EXTRACTION W/PHACO Right 01/08/2020   Procedure: CATARACT EXTRACTION PHACO AND INTRAOCULAR LENS PLACEMENT ([facility]) RIGHT DIABETIC 5.75  00:49.3;  Surgeon: Birder Robson, MD;  Location: [facility];  Service: Ophthalmology;  Laterality: Right;  Diabetic   laparscopic chole     PACEMAKER IMPLANT N/A 10/08/2020   Procedure: PACEMAKER IMPLANT;  Surgeon: Vickie Epley, MD;  Location: [facility];  Service: Cardiovascular;  Laterality: N/A;   SPINE SURGERY     cervical spine surgery   trigeminal neuralgia     with surgery in [place] History:  reports that he has been smoking cigarettes. He has a 90.00 pack-year smoking history. He uses  smokeless tobacco. He reports that he does not currently use alcohol. He reports that he does not use drugs.  Allergies  Allergen Reactions   Other Rash    After getting pacemaker. The medication applied to skin caused a bad rash    Family History  Problem Relation Age of Onset   Cancer Mother        died @ 9.   COPD Father        died @ 37.   Diabetes Brother        died in early 2's.   Family history: Family history reviewed and not pertinent.  Prior to Admission medications   Medication Sig Start Date End Date Taking? Authorizing Provider  amiodarone (PACERONE) 200 MG tablet Take 1 tablet (200 mg total) by mouth daily. 02/23/21    Kate Sable, MD  apixaban Arne [place]) 5 MG TABS tablet Take 1 tablet by mouth twice daily 02/16/22   Zachary Haven, MD  azelastine (ASTELIN) 0.1 % nasal spray Place 2 sprays into both nostrils 2 (two) times daily. Use in each nostril as directed 04/17/21   Zachary Haven, MD  blood glucose meter kit and supplies KIT Dispense based on patient and insurance preference. Use up to four times daily as directed. (FOR ICD-9 250.00, 250.01). 09/16/20   Zachary Haven, MD  buPROPion [facility] SR) 150 MG 12 hr tablet Take 1 tablet (150 mg total) by mouth 2 (two) times daily. 08/19/21   Zachary Haven, MD  finasteride (PROSCAR) 5 MG tablet Take 1 tablet (5 mg total) by mouth daily. 07/20/21   Zachary Haven, MD  linagliptin (TRADJENTA) 5 MG TABS tablet Take 1 tablet (5 mg total) by mouth daily. 04/17/21   Zachary Haven, MD  lisinopril (ZESTRIL) 20 MG tablet Take 1 tablet (20 mg total) by mouth daily. 04/17/21   Zachary Haven, MD  metFORMIN (GLUCOPHAGE XR) 500 MG 24 hr tablet Take 1 tablet (500 mg total) by mouth in the morning and at bedtime. 04/17/21   Zachary Haven, MD  nicotine (NICODERM CQ - DOSED IN MG/24 HOURS) 14 mg/24hr patch Place 1 patch (14 mg total) onto the skin daily. Start after finishing the step 1 patch. 07/20/21   Zachary Haven, MD  nicotine (NICODERM CQ - DOSED IN MG/24 HOURS) 21 mg/24hr patch Place 1 patch (21 mg total) onto the skin daily. Use for 6 weeks. 07/20/21   Zachary Haven, MD  nicotine (NICODERM CQ - DOSED IN MG/24 HR) 7 mg/24hr patch Place 1 patch (7 mg total) onto the skin daily. Start after finishing the step 2 patch. 07/20/21   Zachary Haven, MD  nicotine polacrilex (NICORETTE) 2 MG gum Take 1 each (2 mg total) by mouth as needed for smoking cessation. May use every 2 hours as needed. 07/20/21   Zachary Haven, MD  ONE TOUCH ULTRA TEST test strip USE TEST STRIP TO CHECK GLUCOSE AT LEAST ONCE DAILY 05/06/16   Thersa Salt G, DO   rosuvastatin (CRESTOR) 20 MG tablet Take 1 tablet (20 mg total) by mouth daily. 04/17/21   Zachary Haven, MD  tamsulosin (FLOMAX) 0.4 MG CAPS capsule Take 2 capsules (0.8 mg total) by mouth daily. 06/16/21   Zachary Haven, MD  traZODone (DESYREL) 50 MG tablet Take 0.5-1 tablets (25-50 mg total) by mouth at bedtime as needed for sleep. 08/19/21   Zachary Haven, MD  triamcinolone ointment (KENALOG) 0.1 % Apply 1 application  topically 2 (two) times daily as needed (rash). 08/19/21   Zachary Haven, MD  vitamin B-12 (CYANOCOBALAMIN) 500 MCG tablet Take 500 mcg by mouth daily.    [provider]   Physical Exam: Vitals:   09/14/22 0843 09/14/22 0850 09/14/22 0851 09/14/22 1218  BP:  (!) 132/97  (!) 163/86  Pulse:  83  74  Resp:  18  18  Temp:  98 F (36.7 C)  98 F (36.7 C)  TempSrc:  Oral  Oral  SpO2:  (!) 87%  100%  Weight: 79.8 kg  77.1 kg   Height: 6' (1.829 m)  6' (1.829 m)    Constitutional: appears age-appropriate, frail, NAD, calm, comfortable Eyes: PERRL, lids and conjunctivae normal ENMT: Mucous membranes are moist. Posterior pharynx clear of any exudate or lesions. Age-appropriate dentition. Hearing appropriate Neck: normal, supple, no masses, no thyromegaly Respiratory: clear to auscultation bilaterally, no wheezing, no crackles. Normal respiratory effort. No accessory muscle use.  Cardiovascular: Regular rate and rhythm, no murmurs / rubs / gallops. No extremity edema. 2+ pedal pulses. No carotid bruits.  Abdomen: no tenderness, no masses palpated, no hepatosplenomegaly. Bowel sounds positive.  Musculoskeletal: no clubbing / cyanosis. No joint deformity upper and lower extremities. Good ROM, no contractures, no atrophy. Normal muscle tone.  Skin: no rashes, lesions, ulcers. No induration Neurologic: Sensation intact. Strength 5/5 in all 4.  Psychiatric: Normal judgment and insight. Alert and oriented x 3. Normal mood.   EKG: independently reviewed,  showing atrial rhythm with rate of 78, QTc 462  Chest x-ray on Admission: I personally reviewed and I agree with radiologist reading as below.  DG Chest 2 View  Result Date: 09/14/2022 CLINICAL DATA:  Cough EXAM: CHEST - 2 VIEW COMPARISON:  CXR 1/13/221 FINDINGS: Left-sided dual lead cardiac device in place with unchanged lead positioning. No pleural effusion. No pneumothorax. No focal airspace opacity. No displaced rib fractures. Visualized upper abdomen is unremarkable. Surgical clips in the right upper quadrant. Partially visualized cervical spinal fusion hardware in place. Mild degenerative changes of the bilateral AC joints. IMPRESSION: No focal airspace opacity. Electronically Signed   By: Marin [place] M.D.   On: 09/14/2022 09:16    Labs on Admission: I have personally reviewed following labs  CBC: Recent Labs  Lab 09/14/22 0859  WBC 8.0  HGB 13.8  HCT 41.6  MCV 92.0  PLT 034   Basic Metabolic Panel: Recent Labs  Lab 09/14/22 0859 09/14/22 0914  NA 140  --   K 3.3*  --   CL 107  --   CO2 25  --   GLUCOSE 164*  --   BUN 14  --   CREATININE 1.39*  --   CALCIUM 9.1  --   MG  --  2.1   GFR: Estimated Creatinine Clearance: 43.1 mL/min (A) (by C-G formula based on SCr of 1.39 mg/dL (H)).  Liver Function Tests: Recent Labs  Lab 09/14/22 0859  AST 18  ALT 14  ALKPHOS 135*  BILITOT 1.0  PROT 7.1  ALBUMIN 4.4   Urine analysis:    Component Value Date/Time   COLORURINE YELLOW 10/31/2020 0630   APPEARANCEUR CLEAR 10/31/2020 0630   APPEARANCEUR Clear 08/05/2014 2214   LABSPEC 1.021 10/31/2020 0630   LABSPEC 1.012 08/05/2014 2214   PHURINE 5.0 10/31/2020 0630   GLUCOSEU 150 (A) 10/31/2020 0630   GLUCOSEU Negative 08/05/2014 2214   GLUCOSEU NEG mg/dL 02/02/2010 1815   HGBUR SMALL (A) 10/31/2020 0630  BILIRUBINUR NEGATIVE 10/31/2020 0630   BILIRUBINUR Negative 08/05/2014 2214   KETONESUR NEGATIVE 10/31/2020 0630   PROTEINUR 30 (A) 10/31/2020 0630    UROBILINOGEN 0.2 02/02/2010 1815   NITRITE NEGATIVE 10/31/2020 0630   LEUKOCYTESUR TRACE (A) 10/31/2020 0630   LEUKOCYTESUR Negative 08/05/2014 2214   Dr. Tobie Poet [facility]  If 7PM-7AM, please contact overnight-coverage provider If 7AM-7PM, please contact day coverage provider [email or web address]  09/14/2022, 4:14 PM

## 2022-09-14 NOTE — Assessment & Plan Note (Signed)
-   As needed nicotine patch ordered ?

## 2022-09-14 NOTE — ED Provider Notes (Addendum)
[facility] Provider Note    Event Date/Time   First MD Initiated Contact with Patient 09/14/22 [phone]     (approximate)   History   Shortness of Breath   HPI  Zachary Santos is a 86 y.o. male  who comes in with cough and shortness of breath for the past 2 days. Does have history of diabetes and complete heart block with pacemaker. He reports having cough with mucous.  Denies blood. Took breathing treatment here which helped with SOB.  + smoking history of 1 pack a day. No swelling in legs.  Does take eliquis for afib, does report some intermittent dosage missing.    87% on room air  Triage Vital Signs: ED Triage Vitals  Enc Vitals Group     BP 09/14/22 0850 (!) 132/97     Pulse Rate 09/14/22 0850 83     Resp 09/14/22 0850 18     Temp 09/14/22 0850 98 F (36.7 C)     Temp Source 09/14/22 0850 Oral     SpO2 09/14/22 0850 (!) 87 %     Weight 09/14/22 0843 175 lb 14.8 oz (79.8 kg)     Height 09/14/22 0843 6' (1.829 m)     Head Circumference --      Peak Flow --      Pain Score 09/14/22 0843 0     Pain Loc --      Pain Edu? --      Excl. in [place]? --     Most recent vital signs: Vitals:   09/14/22 0850  BP: (!) 132/97  Pulse: 83  Resp: 18  Temp: 98 F (36.7 C)  SpO2: (!) 87%     General: Awake, no distress.  CV:  Good peripheral perfusion. ICD palpated on chest wall.  Resp:  Normal effort. Intermittent wheeze  Abd:  No distention. Non tender  Other:  No swelling no calf tenderness    ED Results / Procedures / Treatments   Labs (all labs ordered are listed, but only abnormal results are displayed) Labs Reviewed  RESP PANEL BY RT-PCR (FLU A&B, COVID) ARPGX2  CBC  COMPREHENSIVE METABOLIC PANEL  LACTIC ACID, PLASMA  LACTIC ACID, PLASMA  TROPONIN I (HIGH SENSITIVITY)     EKG  My interpretation of EKG:  A sensed V paced rate of 78, no st elevation, twi in inferior leads, normal intervals.   RADIOLOGY I have reviewed the xray  personally and interpreted no evidence of any pneumonia   PROCEDURES:  Critical Care performed: Yes, see critical care procedure note(s)  .1-3 Lead EKG Interpretation  Performed by: Vanessa [place], MD Authorized by: Vanessa [place], MD     Interpretation: normal     ECG rate:  80   ECG rate assessment: normal     Rhythm: paced     Ectopy: none     Conduction: normal   .Critical Care  Performed by: Vanessa [place], MD Authorized by: Vanessa [place], MD   Critical care provider statement:    Critical care time (minutes):  30   Critical care was necessary to treat or prevent imminent or life-threatening deterioration of the following conditions:  Respiratory failure   Critical care was time spent personally by me on the following activities:  Development of treatment plan with patient or surrogate, discussions with consultants, evaluation of patient's response to treatment, examination of patient, ordering and review of laboratory studies, ordering and review of radiographic  studies, ordering and performing treatments and interventions, pulse oximetry, re-evaluation of patient's condition and review of old [place] ED: Medications  ipratropium-albuterol (DUONEB) 0.5-2.5 (3) MG/3ML nebulizer solution 3 mL (has no administration in time range)  methylPREDNISolone sodium succinate (SOLU-MEDROL) 125 mg/2 mL injection 125 mg (has no administration in time range)     IMPRESSION / MDM / ASSESSMENT AND PLAN / ED COURSE  I reviewed the triage vital signs and the nursing notes.   Patient's presentation is most consistent with acute presentation with potential threat to life or bodily function.   Patient comes in hypoxic to 87% so placed on oxygen.  An occasional wheeze cured here and there and does report significant improvement with the DuoNeb given from triage.  We will treat as COPD exacerbation but chest x-ray evaluate for pneumonia, troponin evaluate for  ACS.  Chest x-ray was reassuring given his report of intermittent compliance with Eliquis will get CT PE.  Given hypoxia patient treated with DuoNebs, steroids and I will discuss the hospital team for admission.  COVID, flu are negative CBC reassuring.  CMP shows creatinine that is stable.  Troponin slightly elevated lactate normal.  12:04 PM patient refused CT scan and given he is supposed on Eliquis anyways he would be getting treatment regardless as long as he is not missing his pills.  He does not have any evidence of hemodynamic instability and does not want to undergo CT scan I do suspect this is most likely COPD and viral illness given his wife is also here in the emergency room and has been admitted for very similar symptoms.  He is got no swelling in his legs or calf tenderness to suggest DVT.  At this time will admit patient to the hospital team  The patient is on the cardiac monitor to evaluate for evidence of arrhythmia and/or significant heart rate changes.      FINAL CLINICAL IMPRESSION(S) / ED DIAGNOSES   Final diagnoses:  Acute respiratory failure with hypoxia (HCC)  COPD exacerbation ([place])     Rx / DC Orders   ED Discharge Orders     None        Note:  This document was prepared using Dragon voice recognition software and may include unintentional dictation errors.   Vanessa [place], MD 09/14/22 1156    Vanessa [place], MD 09/14/22 [phone]

## 2022-09-14 NOTE — Assessment & Plan Note (Addendum)
-   Ativan 0.5 mg IV every 6 hours as needed for anxiety, 3 doses ordered

## 2022-09-14 NOTE — ED Notes (Signed)
1 set of blood cultures, lactic, light green top, lavender top, red top, blue top, and covid swab obtained and sent to lab at this time.

## 2022-09-14 NOTE — Assessment & Plan Note (Addendum)
-    Eliquis 5 mg p.o. twice daily and amiodarone 200 mg daily resumed

## 2022-09-14 NOTE — Assessment & Plan Note (Signed)
-   Home metformin 500 mg in the morning and at bedtime not resumed on admission - Insulin SSI with at bedtime coverage ordered - Goal inpatient blood glucose level is 140-180

## 2022-09-14 NOTE — Assessment & Plan Note (Addendum)
-   Lisinopril 10 mg daily resumed - Hydralazine 10 mg p.o. every 6 hours as needed for SBP greater than 175, 4 days ordered

## 2022-09-14 NOTE — Assessment & Plan Note (Addendum)
-   Trazodone 25-50 mg nightly as needed for sleep ordered

## 2022-09-14 NOTE — ED Triage Notes (Signed)
Pt brought in by [facility] for shob, states did get svn with EMS, states feels better. Pt states has had shob and cough for few days. Pt denies any fever.

## 2022-09-14 NOTE — Assessment & Plan Note (Signed)
-   Magnesium checked on admission was within normal limits - Polycitra 20 mill COVID nightly one-time dose ordered

## 2022-09-14 NOTE — ED Triage Notes (Signed)
Presents via EMS   breathing diff since yesterday     Duo neb per EMS

## 2022-09-14 NOTE — Assessment & Plan Note (Addendum)
With acute hypoxic respiratory failure requiring O2 supplementation - Etiology workup in progress - Check blood cultures x 2, procalcitonin, check MRSA PCR - Ceftriaxone 2 g IV one-time dose, azithromycin 500 mg IV one-time dose ordered - Recheck lactic acid to ensure downtrending

## 2022-09-14 NOTE — Hospital Course (Addendum)
Zachary Santos is an 86 year old male with history of nicotine dependence, hypertension, tobacco abuse, non-insulin-dependent diabetes mellitus, anxiety, COPD, no prior oxygen supplementation requirement, who presents emergency department for chief concerns of shortness of breath, productive cough. 11/28: in ED, temperature 98, respiration rate 18, heart rate 74, 163/86, SpO2 of 87% on room air and improved to 100% on 2 L nasal cannula. Serum potassium 3.3, chloride 107, bicarb 25, creatinine of 1.39, GFR 50, nonfasting blood glucose 164, WBC 8.0, hemoglobin 13.8, platelets of 163. Troponin was 19.  Lactic acid was 1.4.  COVID/influenza A/influenza B PCR were negative. Chest x-ray 2 view: Was read as no focal airspace opacity. A CTA of the chest to assess for PE was initially planned by EDP however patient declined. Per ED note: Patient received 1 treatment of DuoNeb's per EMS. Received another DuoNeb in ED as well as Solu-Medrol 125 mg IV one-time dose.  11/29: lactate trending down 5.1 --> 3.7. Remains on 2L O2 .      Consultants:  ***  Procedures: ***      ASSESSMENT & PLAN:   Principal Problem:   COPD exacerbation ([place]) Active Problems:   Nicotine dependence, cigarettes, uncomplicated   Essential hypertension, benign   Type 2 diabetes mellitus with hyperglycemia, without long-term current use of insulin (HCC)   Dyslipidemia   Anxiety and depression   Insomnia   Atrial fibrillation, chronic (HCC)   PND (paroxysmal nocturnal dyspnea)   Hypokalemia   Elevated lactic acid level   Anxiety and depression - Ativan 0.5 mg IV every 6 hours as needed for anxiety, 3 doses ordered  Essential hypertension, benign - Lisinopril 10 mg daily resumed - Hydralazine 10 mg p.o. every 6 hours as needed for SBP greater than 175, 4 days ordered  Insomnia - Trazodone 25-50 mg nightly as needed for sleep ordered  Type 2 diabetes mellitus with hyperglycemia, without long-term current use of  insulin (HCC) - Home metformin 500 mg in the morning and at bedtime not resumed on admission - Insulin SSI with at bedtime coverage ordered - Goal inpatient blood glucose level is 140-180  Nicotine dependence, cigarettes, uncomplicated - As needed nicotine patch ordered  COPD exacerbation (HCC) Acute hypoxic respiratory failure requiring O2 supplementation - Query upper respiratory infection, as patient's spouse is also being admitted for COPD exacerbation - COVID/influenza A/influenza B PCR were negative - Solu-Medrol 40 mg IV twice daily 09/15/2022, 2 doses ordered - DuoNebs 3 times daily, 3 doses ordered - Continue oxygen supplementation to maintain SpO2 greater than 92% - Admit to telemetry medical, observation - TOC consulted for DME  Atrial fibrillation, chronic (HCC) -  Eliquis 5 mg p.o. twice daily and amiodarone 200 mg daily resumed  Dyslipidemia - Patient does not take the rosuvastatin 20 mg daily ordered outpatient, this has not been resumed  Hypokalemia - Magnesium checked on admission was within normal limits - Polycitra 20 mill COVID nightly one-time dose ordered  Elevated lactic acid level With acute hypoxic respiratory failure requiring O2 supplementation - Etiology workup in progress - Check blood cultures x 2, procalcitonin, check MRSA PCR - Ceftriaxone 2 g IV one-time dose, azithromycin 500 mg IV one-time dose ordered - Recheck lactic acid to ensure downtrending    DVT prophylaxis: *** Pertinent IV fluids/nutrition: *** Central lines / invasive devices: ***  Code Status: *** Family Communication: ***  Disposition: *** TOC needs: *** Barriers to discharge / significant pending items: ***

## 2022-09-14 NOTE — Assessment & Plan Note (Signed)
-   Patient does not take the rosuvastatin 20 mg daily ordered outpatient, this has not been resumed

## 2022-09-15 DIAGNOSIS — J441 Chronic obstructive pulmonary disease with (acute) exacerbation: Secondary | ICD-10-CM | POA: Diagnosis not present

## 2022-09-15 LAB — CBC
HCT: 36.4 % — ABNORMAL LOW (ref 39.0–52.0)
Hemoglobin: 12.2 g/dL — ABNORMAL LOW (ref 13.0–17.0)
MCH: 30.5 pg (ref 26.0–34.0)
MCHC: 33.5 g/dL (ref 30.0–36.0)
MCV: 91 fL (ref 80.0–100.0)
Platelets: 135 10*3/uL — ABNORMAL LOW (ref 150–400)
RBC: 4 MIL/uL — ABNORMAL LOW (ref 4.22–5.81)
RDW: 13.7 % (ref 11.5–15.5)
WBC: 10.3 10*3/uL (ref 4.0–10.5)
nRBC: 0 % (ref 0.0–0.2)

## 2022-09-15 LAB — LACTIC ACID, PLASMA: Lactic Acid, Venous: 3.7 mmol/L (ref 0.5–1.9)

## 2022-09-15 LAB — BASIC METABOLIC PANEL
Anion gap: 7 (ref 5–15)
BUN: 17 mg/dL (ref 8–23)
CO2: 23 mmol/L (ref 22–32)
Calcium: 8.8 mg/dL — ABNORMAL LOW (ref 8.9–10.3)
Chloride: 108 mmol/L (ref 98–111)
Creatinine, Ser: 1.42 mg/dL — ABNORMAL HIGH (ref 0.61–1.24)
GFR, Estimated: 49 mL/min — ABNORMAL LOW (ref >60)
Glucose, Bld: 256 mg/dL — ABNORMAL HIGH (ref 70–99)
Potassium: 3.9 mmol/L (ref 3.5–5.1)
Sodium: 138 mmol/L (ref 135–145)

## 2022-09-15 LAB — GLUCOSE, CAPILLARY
Glucose-Capillary: 249 mg/dL — ABNORMAL HIGH (ref 70–99)
Glucose-Capillary: 218 mg/dL — ABNORMAL HIGH (ref 70–99)

## 2022-09-15 MED ORDER — PREDNISONE 50 MG PO TABS: 50.0000 mg | ORAL_TABLET | Freq: Every day | ORAL | 0 refills | Status: AC

## 2022-09-15 MED ORDER — ANORO ELLIPTA 62.5-25 MCG/ACT IN AEPB: 1.0000 | INHALATION_SPRAY | Freq: Every day | RESPIRATORY_TRACT | 0 refills | Status: DC

## 2022-09-15 MED ORDER — IPRATROPIUM-ALBUTEROL 0.5-2.5 (3) MG/3ML IN SOLN
3.0000 mL | Freq: Three times a day (TID) | RESPIRATORY_TRACT | Status: DC
  Administered 2022-09-15: 3 mL via RESPIRATORY_TRACT
  Filled 2022-09-15: qty 3

## 2022-09-15 MED ORDER — NICOTINE 21 MG/24HR TD PT24
21.0000 mg | MEDICATED_PATCH | Freq: Every day | TRANSDERMAL | Status: DC
  Filled 2022-09-15: qty 1

## 2022-09-15 MED ORDER — ALBUTEROL SULFATE HFA 108 (90 BASE) MCG/ACT IN AERS
2.0000 | INHALATION_SPRAY | Freq: Four times a day (QID) | RESPIRATORY_TRACT | 0 refills | Status: AC | PRN
Start: 2022-09-15 — End: ?

## 2022-09-15 MED ORDER — STERILE WATER FOR INJECTION IJ SOLN
INTRAMUSCULAR | Status: AC
  Administered 2022-09-15: 1 mL
  Filled 2022-09-15: qty 10

## 2022-09-15 NOTE — Inpatient Diabetes Management (Signed)
Inpatient Diabetes Program Recommendations  AACE/ADA: New Consensus Statement on Inpatient Glycemic Control   Target Ranges:  Prepandial:   less than 140 mg/dL      Peak postprandial:   less than 180 mg/dL (1-2 hours)      Critically ill patients:  140 - 180 mg/dL    Latest Reference Range & Units 09/15/22 05:36  Glucose 70 - 99 mg/dL 256 (H)    Latest Reference Range & Units 09/14/22 12:58 09/14/22 16:50 09/14/22 20:54  Glucose-Capillary 70 - 99 mg/dL 154 (H) 289 (H) 244 (H)   Review of Glycemic Control  Diabetes history: DM2 Outpatient Diabetes medications: Metformin XR 500 mg BID, Tradjenta 5 mg daily (not taking) Current orders for Inpatient glycemic control: Novolog 0-15 units TID with meals, Novolog 0-5 units QHS; Solumedrol 40 mg BID  Inpatient Diabetes Program Recommendations:    Insulin: If steroids are continued, please consider ordering Semglee 5 units Q24H.  Thanks, Barnie Alderman, RN, MSN, [place] Diabetes Coordinator Inpatient Diabetes Program [phone] (Team Pager from 8am to [place])

## 2022-09-15 NOTE — Progress Notes (Signed)
SATURATION QUALIFICATIONS:   Patient Saturations on Room Air at Rest = 96%  Patient Saturations on Room Air while Ambulating = 93%

## 2022-09-15 NOTE — Discharge Summary (Signed)
Physician Discharge Summary   Patient: Zachary Santos MRN: [id number]  DOB: [birth year]   Admit:     Date of Admission: 09/14/2022 Admitted from: home   Discharge: Date of discharge: 09/15/22 Disposition: Home Condition at discharge: fair  CODE STATUS: DNR     Discharge Physician: Emeterio Reeve, DO [facility]     PCP: Leone Haven, MD  Recommendations for Outpatient Follow-up:  Follow up with PCP Leone Haven, MD in 1-2 weeks Please obtain labs/tests: CBC, BMP in 1-2 weeks Ensure maintains COPD regiment  Please follow up on the following pending results: none    Discharge Instructions     Diet - low sodium heart healthy   Complete by: As directed    Diet Carb Modified   Complete by: As directed    Increase activity slowly   Complete by: As directed          Discharge Diagnoses: Principal Problem:   COPD exacerbation ([place]) Active Problems:   Nicotine dependence, cigarettes, uncomplicated   Essential hypertension, benign   Type 2 diabetes mellitus with hyperglycemia, without long-term current use of insulin ([place])   Dyslipidemia   Anxiety and depression   Insomnia   Atrial fibrillation, chronic (HCC)   PND (paroxysmal nocturnal dyspnea)   Hypokalemia   Elevated lactic acid level       Hospital Course: Mr. Zachary Santos is an 86 year old male with history of nicotine dependence, hypertension, tobacco abuse, non-insulin-dependent diabetes mellitus, anxiety, COPD, no prior oxygen supplementation requirement, who presents emergency department for chief concerns of shortness of breath, productive cough. 11/28: in ED, temperature 98, respiration rate 18, heart rate 74, 163/86, SpO2 of 87% on room air and improved to 100% on 2 L nasal cannula. Serum potassium 3.3, chloride 107, bicarb 25, creatinine of 1.39, GFR 50, nonfasting blood glucose 164, WBC 8.0, hemoglobin 13.8, platelets of 163. Troponin was 19.  Lactic acid was 1.4.   COVID/influenza A/influenza B PCR were negative. Chest x-ray 2 view: Was read as no focal airspace opacity. A CTA of the chest to assess for PE was initially planned by EDP however patient declined. Per ED note: Patient received 1 treatment of DuoNeb's per EMS. Received another DuoNeb in ED as well as Solu-Medrol 125 mg IV one-time dose.  11/29: lactate trending down 5.1 --> 3.7. Remains on 2L O2 [place]. In AM but by PM was ambulating on RA and SpO2 93%, patient wanting to go home.       Consultants:  none  Procedures: none      ASSESSMENT & PLAN:   Principal Problem:   COPD exacerbation ([place]) Active Problems:   Nicotine dependence, cigarettes, uncomplicated   Essential hypertension, benign   Type 2 diabetes mellitus with hyperglycemia, without long-term current use of insulin (HCC)   Dyslipidemia   Anxiety and depression   Insomnia   Atrial fibrillation, chronic (HCC)   PND (paroxysmal nocturnal dyspnea)   Hypokalemia   Elevated lactic acid level   COPD exacerbation (HCC) Acute hypoxic respiratory failure requiring O2 supplementation - resolved  - Query upper respiratory infection, as patient's spouse is also being admitted for COPD exacerbation - COVID/influenza A/influenza B PCR were negative - Solu-Medrol IV --> prednisone po on d/c -- started on Anoro and also Rx for albuterol on discharge   Anxiety and depression - follow outpatinet   Essential hypertension, benign - Lisinopril 10 mg daily resumed  Insomnia - Trazodone 25-50 mg nightly as needed for sleep  ordered, follow outpatient for refills   Type 2 diabetes mellitus with hyperglycemia, without long-term current use of insulin ([place]) - resume home emds, follow outpatient   Nicotine dependence, cigarettes, uncomplicated - As needed nicotine patch ordered  Atrial fibrillation, chronic (HCC) -  Eliquis 5 mg p.o. twice daily and amiodarone 200 mg daily resumed  Dyslipidemia - Patient does not take the  rosuvastatin 20 mg daily ordered outpatient, this has not been resumed  Hypokalemia - Magnesium checked on admission was within normal limits -- recheck outpatient           Discharge Instructions  Allergies as of 09/15/2022       Reactions   Other Rash   After getting pacemaker. The medication applied to skin caused a bad rash         Medication List     TAKE these medications    albuterol 108 (90 Base) MCG/ACT inhaler Commonly known as: VENTOLIN HFA Inhale 2 puffs into the lungs every 6 (six) hours as needed for wheezing or shortness of breath.   amiodarone 200 MG tablet Commonly known as: PACERONE Take 1 tablet (200 mg total) by mouth daily.   amLODipine 5 MG tablet Commonly known as: NORVASC Take 5 mg by mouth daily.   Anoro Ellipta 62.5-25 MCG/ACT Aepb Generic drug: umeclidinium-vilanterol Inhale 1 puff into the lungs daily.   azelastine 0.1 % nasal spray Commonly known as: ASTELIN Place 2 sprays into both nostrils 2 (two) times daily. Use in each nostril as directed   blood glucose meter kit and supplies Kit Dispense based on patient and insurance preference. Use up to four times daily as directed. (FOR ICD-9 250.00, 250.01).   busPIRone 10 MG tablet Commonly known as: BUSPAR Take 10 mg by mouth 2 (two) times daily as needed (prn).   Eliquis 5 MG Tabs tablet Generic drug: apixaban Take 1 tablet by mouth twice daily   finasteride 5 MG tablet Commonly known as: Proscar Take 1 tablet (5 mg total) by mouth daily.   lisinopril 10 MG tablet Commonly known as: ZESTRIL Take 10 mg by mouth daily. What changed: Another medication with the same name was removed. Continue taking this medication, and follow the directions you see here.   metFORMIN 500 MG 24 hr tablet Commonly known as: Glucophage XR Take 1 tablet (500 mg total) by mouth in the morning and at bedtime.   ONE TOUCH ULTRA TEST test strip Generic drug: glucose blood USE TEST STRIP TO  CHECK GLUCOSE AT LEAST ONCE DAILY   predniSONE 50 MG tablet Commonly known as: DELTASONE Take 1 tablet (50 mg total) by mouth daily for 4 days. Start taking on: September 16, 2022   rosuvastatin 20 MG tablet Commonly known as: CRESTOR Take 1 tablet (20 mg total) by mouth daily.   tamsulosin 0.4 MG Caps capsule Commonly known as: FLOMAX Take 0.4 mg by mouth daily after supper.          Allergies  Allergen Reactions   Other Rash    After getting pacemaker. The medication applied to skin caused a bad rash      Subjective: pt feels better today, still come coughing but he would like to go home if possible!    Discharge Exam: BP (!) 154/79 (BP Location: Left Arm)   Pulse 78   Temp 97.7 F (36.5 C)   Resp 19   Ht 6' (1.829 m)   Wt 77.1 kg   SpO2 97%   BMI 23.06  kg/m  General: Pt is alert, awake, not in acute distress Cardiovascular: RRR, S1/S2 +, no rubs, no gallops Respiratory: reduced breath sounds bilaterally, scattered coarse sounds and mild wheezing, no rhonchi Abdominal: Soft, NT, ND, bowel sounds + Extremities: no edema, no cyanosis     The results of significant diagnostics from this hospitalization (including imaging, microbiology, ancillary and laboratory) are listed below for reference.     Microbiology: Recent Results (from the past 240 hour(s))  Resp Panel by RT-PCR (Flu A&B, Covid) Anterior Nasal Swab     Status: None   Collection Time: 09/14/22  8:59 AM   Specimen: Anterior Nasal Swab  Result Value Ref Range Status   SARS Coronavirus 2 by RT PCR NEGATIVE NEGATIVE Final    Comment: (NOTE) SARS-CoV-2 target nucleic acids are NOT DETECTED.  The SARS-CoV-2 RNA is generally detectable in upper respiratory specimens during the acute phase of infection. The lowest concentration of SARS-CoV-2 viral copies this assay can detect is 138 copies/mL. A negative result does not preclude SARS-Cov-2 infection and should not be used as the sole basis for  treatment or other patient management decisions. A negative result may occur with  improper specimen collection/handling, submission of specimen other than nasopharyngeal swab, presence of viral mutation(s) within the areas targeted by this assay, and inadequate number of viral copies(<138 copies/mL). A negative result must be combined with clinical observations, patient history, and epidemiological information. The expected result is Negative.  Fact Sheet for Patients:  EntrepreneurPulse.com.au  Fact Sheet for Healthcare Providers:  IncredibleEmployment.be  This test is no t yet approved or cleared by the [place] FDA and  has been authorized for detection and/or diagnosis of SARS-CoV-2 by FDA under an Emergency Use Authorization (EUA). This EUA will remain  in effect (meaning this test can be used) for the duration of the COVID-19 declaration under Section 564(b)(1) of the Act, 21 U.S.C.section 360bbb-3(b)(1), unless the authorization is terminated  or revoked sooner.       Influenza A by PCR NEGATIVE NEGATIVE Final   Influenza B by PCR NEGATIVE NEGATIVE Final    Comment: (NOTE) The Xpert Xpress SARS-CoV-2/FLU/RSV plus assay is intended as an aid in the diagnosis of influenza from Nasopharyngeal swab specimens and should not be used as a sole basis for treatment. Nasal washings and aspirates are unacceptable for Xpert Xpress SARS-CoV-2/FLU/RSV testing.  Fact Sheet for Patients: EntrepreneurPulse.com.au  Fact Sheet for Healthcare Providers: IncredibleEmployment.be  This test is not yet approved or cleared by the [place] FDA and has been authorized for detection and/or diagnosis of SARS-CoV-2 by FDA under an Emergency Use Authorization (EUA). This EUA will remain in effect (meaning this test can be used) for the duration of the COVID-19 declaration under Section 564(b)(1) of the Act, 21  U.S.C. section 360bbb-3(b)(1), unless the authorization is terminated or revoked.  Performed at [facility], [address] [place], [place] [address]   Culture, blood (Routine X 2) w Reflex to ID Panel     Status: None (Preliminary result)   Collection Time: 09/14/22  8:59 AM   Specimen: BLOOD  Result Value Ref Range Status   Specimen Description BLOOD RIGHT ASSIST CONTROL  Final   Special Requests   Final    BOTTLES DRAWN AEROBIC AND ANAEROBIC Blood Culture results may not be optimal due to an excessive volume of blood received in culture bottles   Culture   Final    NO GROWTH < 12 HOURS Performed at [facility],  [place], [place] [address]    Report Status PENDING  Incomplete  Culture, blood (Routine X 2) w Reflex to ID Panel     Status: None (Preliminary result)   Collection Time: 09/14/22 11:42 PM   Specimen: BLOOD  Result Value Ref Range Status   Specimen Description BLOOD LEFT ASSIST CONTROL  Final   Special Requests   Final    BOTTLES DRAWN AEROBIC AND ANAEROBIC Blood Culture results may not be optimal due to an excessive volume of blood received in culture bottles   Culture   Final    NO GROWTH < 12 HOURS Performed at [facility], [address] [place], [place] [address]    Report Status PENDING  Incomplete     Labs: BNP (last 3 results) No results for input(s): "BNP" in the last 8760 hours. Basic Metabolic Panel: Recent Labs  Lab 09/14/22 0859 09/14/22 0914 09/15/22 0536  NA 140  --  138  K 3.3*  --  3.9  CL 107  --  108  CO2 25  --  23  GLUCOSE 164*  --  256*  BUN 14  --  17  CREATININE 1.39*  --  1.42*  CALCIUM 9.1  --  8.8*  MG  --  2.1  --    Liver Function Tests: Recent Labs  Lab 09/14/22 0859  AST 18  ALT 14  ALKPHOS 135*  BILITOT 1.0  PROT 7.1  ALBUMIN 4.4   No results for input(s): "LIPASE", "AMYLASE" in the last 168 hours. No results for input(s): "AMMONIA" in the last 168  hours. CBC: Recent Labs  Lab 09/14/22 0859 09/15/22 0536  WBC 8.0 10.3  HGB 13.8 12.2*  HCT 41.6 36.4*  MCV 92.0 91.0  PLT 163 135*   Cardiac Enzymes: No results for input(s): "CKTOTAL", "CKMB", "CKMBINDEX", "TROPONINI" in the last 168 hours. BNP: Invalid input(s): "POCBNP" CBG: Recent Labs  Lab 09/14/22 1258 09/14/22 1650 09/14/22 2054 09/15/22 0818 09/15/22 1306  GLUCAP 154* 289* 244* 249* 218*   D-Dimer No results for input(s): "DDIMER" in the last 72 hours. Hgb A1c No results for input(s): "HGBA1C" in the last 72 hours. Lipid Profile No results for input(s): "CHOL", "HDL", "LDLCALC", "TRIG", "CHOLHDL", "LDLDIRECT" in the last 72 hours. Thyroid function studies No results for input(s): "TSH", "T4TOTAL", "T3FREE", "THYROIDAB" in the last 72 hours.  Invalid input(s): "FREET3" Anemia work up No results for input(s): "VITAMINB12", "FOLATE", "FERRITIN", "TIBC", "IRON", "RETICCTPCT" in the last 72 hours. Urinalysis    Component Value Date/Time   COLORURINE YELLOW 10/31/2020 0630   APPEARANCEUR CLEAR 10/31/2020 0630   APPEARANCEUR Clear 08/05/2014 2214   LABSPEC 1.021 10/31/2020 0630   LABSPEC 1.012 08/05/2014 2214   PHURINE 5.0 10/31/2020 0630   GLUCOSEU 150 (A) 10/31/2020 0630   GLUCOSEU Negative 08/05/2014 2214   GLUCOSEU NEG mg/dL 02/02/2010 1815   HGBUR SMALL (A) 10/31/2020 0630   BILIRUBINUR NEGATIVE 10/31/2020 0630   BILIRUBINUR Negative 08/05/2014 2214   KETONESUR NEGATIVE 10/31/2020 0630   PROTEINUR 30 (A) 10/31/2020 0630   UROBILINOGEN 0.2 02/02/2010 1815   NITRITE NEGATIVE 10/31/2020 0630   LEUKOCYTESUR TRACE (A) 10/31/2020 0630   LEUKOCYTESUR Negative 08/05/2014 2214   Sepsis Labs Recent Labs  Lab 09/14/22 0859 09/15/22 0536  WBC 8.0 10.3   Microbiology Recent Results (from the past 240 hour(s))  Resp Panel by RT-PCR (Flu A&B, Covid) Anterior Nasal Swab     Status: None   Collection Time: 09/14/22  8:59  AM   Specimen: Anterior Nasal Swab   Result Value Ref Range Status   SARS Coronavirus 2 by RT PCR NEGATIVE NEGATIVE Final    Comment: (NOTE) SARS-CoV-2 target nucleic acids are NOT DETECTED.  The SARS-CoV-2 RNA is generally detectable in upper respiratory specimens during the acute phase of infection. The lowest concentration of SARS-CoV-2 viral copies this assay can detect is 138 copies/mL. A negative result does not preclude SARS-Cov-2 infection and should not be used as the sole basis for treatment or other patient management decisions. A negative result may occur with  improper specimen collection/handling, submission of specimen other than nasopharyngeal swab, presence of viral mutation(s) within the areas targeted by this assay, and inadequate number of viral copies(<138 copies/mL). A negative result must be combined with clinical observations, patient history, and epidemiological information. The expected result is Negative.  Fact Sheet for Patients:  EntrepreneurPulse.com.au  Fact Sheet for Healthcare Providers:  IncredibleEmployment.be  This test is no t yet approved or cleared by the [place] FDA and  has been authorized for detection and/or diagnosis of SARS-CoV-2 by FDA under an Emergency Use Authorization (EUA). This EUA will remain  in effect (meaning this test can be used) for the duration of the COVID-19 declaration under Section 564(b)(1) of the Act, 21 U.S.C.section 360bbb-3(b)(1), unless the authorization is terminated  or revoked sooner.       Influenza A by PCR NEGATIVE NEGATIVE Final   Influenza B by PCR NEGATIVE NEGATIVE Final    Comment: (NOTE) The Xpert Xpress SARS-CoV-2/FLU/RSV plus assay is intended as an aid in the diagnosis of influenza from Nasopharyngeal swab specimens and should not be used as a sole basis for treatment. Nasal washings and aspirates are unacceptable for Xpert Xpress SARS-CoV-2/FLU/RSV testing.  Fact Sheet for  Patients: EntrepreneurPulse.com.au  Fact Sheet for Healthcare Providers: IncredibleEmployment.be  This test is not yet approved or cleared by the [place] FDA and has been authorized for detection and/or diagnosis of SARS-CoV-2 by FDA under an Emergency Use Authorization (EUA). This EUA will remain in effect (meaning this test can be used) for the duration of the COVID-19 declaration under Section 564(b)(1) of the Act, 21 U.S.C. section 360bbb-3(b)(1), unless the authorization is terminated or revoked.  Performed at [facility], [address] [place], [place] [address]   Culture, blood (Routine X 2) w Reflex to ID Panel     Status: None (Preliminary result)   Collection Time: 09/14/22  8:59 AM   Specimen: BLOOD  Result Value Ref Range Status   Specimen Description BLOOD RIGHT ASSIST CONTROL  Final   Special Requests   Final    BOTTLES DRAWN AEROBIC AND ANAEROBIC Blood Culture results may not be optimal due to an excessive volume of blood received in culture bottles   Culture   Final    NO GROWTH < 12 HOURS Performed at [facility], [address], [place],  [address]    Report Status PENDING  Incomplete  Culture, blood (Routine X 2) w Reflex to ID Panel     Status: None (Preliminary result)   Collection Time: 09/14/22 11:42 PM   Specimen: BLOOD  Result Value Ref Range Status   Specimen Description BLOOD LEFT ASSIST CONTROL  Final   Special Requests   Final    BOTTLES DRAWN AEROBIC AND ANAEROBIC Blood Culture results may not be optimal due to an excessive volume of blood received in culture bottles   Culture   Final  NO GROWTH < 12 HOURS Performed at [facility], [address] [place], [place] [address]    Report Status PENDING  Incomplete   Imaging DG Chest 2 View  Result Date: 09/14/2022 CLINICAL DATA:  Cough EXAM: CHEST - 2 VIEW COMPARISON:  CXR 1/13/221 FINDINGS: Left-sided dual lead  cardiac device in place with unchanged lead positioning. No pleural effusion. No pneumothorax. No focal airspace opacity. No displaced rib fractures. Visualized upper abdomen is unremarkable. Surgical clips in the right upper quadrant. Partially visualized cervical spinal fusion hardware in place. Mild degenerative changes of the bilateral AC joints. IMPRESSION: No focal airspace opacity. Electronically Signed   By: Marin Roberts M.D.   On: 09/14/2022 09:16      Time coordinating discharge: over 30 minutes  SIGNED:  Emeterio Reeve DO [facility]

## 2022-09-15 NOTE — Care Management Obs Status (Signed)
[place] NOTIFICATION   Patient Details  Name: Zachary Santos MRN: [id number] Date of Birth: [birth year]   Medicare Observation Status Notification Given:  Yes    Candie Chroman, LCSW 09/15/2022, 4:16 PM

## 2022-09-15 NOTE — Progress Notes (Signed)
Marjory Sneddon to be D/C'd Home per MD order.  Discussed prescriptions and follow up appointments with the patient. Prescriptions given to patient, medication list explained in detail. Pt verbalized understanding.  Allergies as of 09/15/2022       Reactions   Other Rash   After getting pacemaker. The medication applied to skin caused a bad rash         Medication List     TAKE these medications    albuterol 108 (90 Base) MCG/ACT inhaler Commonly known as: VENTOLIN HFA Inhale 2 puffs into the lungs every 6 (six) hours as needed for wheezing or shortness of breath.   amiodarone 200 MG tablet Commonly known as: PACERONE Take 1 tablet (200 mg total) by mouth daily.   amLODipine 5 MG tablet Commonly known as: NORVASC Take 5 mg by mouth daily.   Anoro Ellipta 62.5-25 MCG/ACT Aepb Generic drug: umeclidinium-vilanterol Inhale 1 puff into the lungs daily.   azelastine 0.1 % nasal spray Commonly known as: ASTELIN Place 2 sprays into both nostrils 2 (two) times daily. Use in each nostril as directed   blood glucose meter kit and supplies Kit Dispense based on patient and insurance preference. Use up to four times daily as directed. (FOR ICD-9 250.00, 250.01).   busPIRone 10 MG tablet Commonly known as: BUSPAR Take 10 mg by mouth 2 (two) times daily as needed (prn).   Eliquis 5 MG Tabs tablet Generic drug: apixaban Take 1 tablet by mouth twice daily   finasteride 5 MG tablet Commonly known as: Proscar Take 1 tablet (5 mg total) by mouth daily.   lisinopril 10 MG tablet Commonly known as: ZESTRIL Take 10 mg by mouth daily. What changed: Another medication with the same name was removed. Continue taking this medication, and follow the directions you see here.   metFORMIN 500 MG 24 hr tablet Commonly known as: Glucophage XR Take 1 tablet (500 mg total) by mouth in the morning and at bedtime.   ONE TOUCH ULTRA TEST test strip Generic drug: glucose blood USE TEST STRIP TO  CHECK GLUCOSE AT LEAST ONCE DAILY   predniSONE 50 MG tablet Commonly known as: DELTASONE Take 1 tablet (50 mg total) by mouth daily for 4 days. Start taking on: September 16, 2022   rosuvastatin 20 MG tablet Commonly known as: CRESTOR Take 1 tablet (20 mg total) by mouth daily.   tamsulosin 0.4 MG Caps capsule Commonly known as: FLOMAX Take 0.4 mg by mouth daily after supper.        Vitals:   09/15/22 0817 09/15/22 1408  BP: (!) 154/79   Pulse: 78   Resp: 19   Temp: 97.7 F (36.5 C)   SpO2: 97% 97%    Skin clean, dry and intact without evidence of skin break down, no evidence of skin tears noted. IV catheter discontinued intact. Site without signs and symptoms of complications. Dressing and pressure applied. Pt denies pain at this time. No complaints noted.  An After Visit Summary was printed and given to the patient. Patient escorted via [place], and D/C home via private auto.  Selz C. Deatra Ina

## 2022-09-15 NOTE — TOC CM/SW Note (Signed)
Patient has orders to discharge home today. Chart reviewed. Patient dropped to 93% on room air while ambulating with RN. No TOC needs identified. CSW signing off.  Dayton Scrape, [place]

## 2022-09-16 ENCOUNTER — Telehealth: Payer: Self-pay

## 2022-09-16 LAB — HEMOGLOBIN A1C
Hgb A1c MFr Bld: 8 % — ABNORMAL HIGH (ref 4.8–5.6)
Mean Plasma Glucose: 183 mg/dL

## 2022-09-16 NOTE — Telephone Encounter (Signed)
Called Patient after Willette Cluster the RN called the office. Patient stated his SpO2 is at 87%. Patient was having trouble breathing so I was explaining that Dr. Caryl Bis did not have availability and could not just write a prescription for Oxygen without seeing him so he needed to go to the ED. Patient stated he could not travel and I advised him due to his breathing he needed to go to the ED 3 times. The Patient's Grandson got on the phone and I gave him the same information and advised him that the Patient really needs to go to the ED. Grandson stated I believe my [place] begged the hospital to release him and he is hard headed. The Grandson stated I know and understand the advice of going to the ED but he will not go for me so I will call all of his children and see if they can get him to go. I stated if he needed me to talk to any of the children to call the office back and he agreed.   Waited a couple of hours and called back but no answer so I called the daughter [place] [employer]. Lorrie and her sister are at the hospital with their Mother. Both daughters stated their Dad (the Patient) was supposed to stay in the hospital another day but the nurses states he begged to be released until they released him. Both daughters also states the Patient does not take any of his medications like they were prescribed and they try to make him be compliant. Lorrie stated Sunday night they called the EMS and the Patient's blood pressure was 215/90 and the paramedic refused to leave until the Patient took all of his medications and his blood pressure came down. Lorrie stated the Pharmacy called today and had the Prednisone 50 mg and the Inhaler the hospital prescribed so the Grandson picked it up and gave it to the Patient. Lorrie states their Mother is getting discharged today then they will be home to keep a closer eye on their Dad to try to make him be compliant.

## 2022-09-16 NOTE — Telephone Encounter (Signed)
Lanette, Nurse Health Advisor for [facility], called to state patient is having trouble breathing and she would like to speak with Dr. Georges Mouse nurse.  I spoke with Jeralyn Bennett, CMA, and transferred call to her.

## 2022-09-16 NOTE — Telephone Encounter (Signed)
Patient has been called 2 times and advised to go to the ED. I also advised the Patient's Grandson and 2 daughters to take the Patient to the ED.

## 2022-09-16 NOTE — Telephone Encounter (Signed)
I have spoke to the Patient and 3 different family members

## 2022-09-16 NOTE — Telephone Encounter (Signed)
Please contact the patient and get more details on his shortness of breath.  Has this worsened since he was discharged?  Has he developed any new symptoms since discharge?  Has he taken the medicines that were prescribed at discharge from the hospital?

## 2022-09-16 NOTE — Telephone Encounter (Signed)
Transition Care Management Follow-up Telephone Call Date of discharge and from where: [facility] 09/15/2022 How have you been since you were released from the hospital? Can't breathe Any questions or concerns? Yes, can't breathe  Items Reviewed: Did the pt receive and understand the discharge instructions provided? Yes  Medications obtained and verified? Yes  Other? Yes  Any new allergies since your discharge? No  Dietary orders reviewed? Yes Do you have support at home? Yes   Home Care and Equipment/Supplies: Were home health services ordered? no If so, what is the name of the agency? N/a  Has the agency set up a time to come to the patient's home? not applicable Were any new equipment or medical supplies ordered?  No What is the name of the medical supply agency? N/a Were you able to get the supplies/equipment? no Do you have any questions related to the use of the equipment or supplies? No  Functional Questionnaire: (I = Independent and D = Dependent) ADLs: I  Bathing/Dressing- I  Meal Prep- I  Eating- I  Maintaining continence- I  Transferring/Ambulation- I  Managing Meds- I  Follow up appointments reviewed:  PCP Hospital f/u appt confirmed? Yes  Scheduled to see Dr Biagio Quint on 09/22/2022 @ 10:45. [facility] f/u appt confirmed? No   Are transportation arrangements needed? No  If their condition worsens, is the pt aware to call PCP or go to the Emergency Dept.? Yes Was the patient provided with contact information for the PCP's office or ED? Yes Was to pt encouraged to call back with questions or concerns? Yes  Juanda Crumble, LPN [facility] [phone]

## 2022-09-16 NOTE — Telephone Encounter (Signed)
RN Willette Cluster called and stated the Patient went to the hospital on 09/14/22 for Acute Respiratory Failure and was released yesterday. Patient's oxygen level is 87% now and he is requesting oxygen. Dr. Caryl Bis does not have availability today and states the Patient needs to be seen in case something has changed. Dr. Caryl Bis advised the Patient to go to the ED. Patient states he can not travel and that is why he has a Therapist, sports come take care of him and his wife. The Grandson got on the phone and stated he believes his Grandfather walked out of the hospital and he believes he is hard headed. I advised going to the ED.Yolanda Bonine is going to call the Patient's children and see if they can make him go to the ED.

## 2022-09-17 NOTE — Telephone Encounter (Signed)
Please call again today to reinforce patient needs to be seen.

## 2022-09-17 NOTE — Telephone Encounter (Signed)
Noted. Will plan on seeing him as scheduled for hospital follow-up.

## 2022-09-17 NOTE — Telephone Encounter (Signed)
Called to check on Patient and advise to go to the ED but no answer on his mobile or home phone. I called Lorrie the daughter. Lorrie states the [place] got the Patient Oxygen & a Nebulizer yesterday. Lorrie states her Dad was doing better when she left his house last night.

## 2022-09-19 LAB — CULTURE, BLOOD (ROUTINE X 2)
Culture: NO GROWTH
Culture: NO GROWTH

## 2022-09-22 ENCOUNTER — Inpatient Hospital Stay: Payer: Medicare HMO | Admitting: Family Medicine

## 2022-09-28 NOTE — Telephone Encounter (Signed)
error 

## 2022-12-23 NOTE — Telephone Encounter (Signed)
This encounter was created in error - please disregard.

## 2023-01-05 ENCOUNTER — Ambulatory Visit

## 2023-01-05 DIAGNOSIS — I442 Atrioventricular block, complete: Secondary | ICD-10-CM

## 2023-01-06 LAB — CUP PACEART REMOTE DEVICE CHECK
Battery Remaining Longevity: 41 mo
Battery Remaining Percentage: 65 %
Battery Voltage: 2.98 V
Brady Statistic AP VP Percent: 74 %
Brady Statistic AP VS Percent: 1 %
Brady Statistic AS VP Percent: 23 %
Brady Statistic AS VS Percent: 1 %
Brady Statistic RA Percent Paced: 71 %
Brady Statistic RV Percent Paced: 96 %
Date Time Interrogation Session: 20240320020022
Implantable Lead Connection Status: 753985
Implantable Lead Connection Status: 753985
Implantable Lead Implant Date: 20211222
Implantable Lead Implant Date: 20211222
Implantable Lead Location: 753859
Implantable Lead Location: 753860
Implantable Pulse Generator Implant Date: 20211222
Lead Channel Impedance Value: 480 Ohm
Lead Channel Impedance Value: 660 Ohm
Lead Channel Pacing Threshold Amplitude: 0.5 V
Lead Channel Pacing Threshold Amplitude: 0.75 V
Lead Channel Pacing Threshold Pulse Width: 0.5 ms
Lead Channel Pacing Threshold Pulse Width: 0.5 ms
Lead Channel Sensing Intrinsic Amplitude: 3 mV
Lead Channel Sensing Intrinsic Amplitude: 9.3 mV
Lead Channel Setting Pacing Amplitude: 3.5 V
Lead Channel Setting Pacing Amplitude: 3.5 V
Lead Channel Setting Pacing Pulse Width: 0.5 ms
Lead Channel Setting Sensing Sensitivity: 2 mV
Pulse Gen Model: 2272
Pulse Gen Serial Number: 3874736

## 2023-02-09 NOTE — Progress Notes (Signed)
Remote pacemaker transmission.   

## 2023-04-06 ENCOUNTER — Ambulatory Visit (INDEPENDENT_AMBULATORY_CARE_PROVIDER_SITE_OTHER)

## 2023-04-06 DIAGNOSIS — I442 Atrioventricular block, complete: Secondary | ICD-10-CM

## 2023-04-06 LAB — CUP PACEART REMOTE DEVICE CHECK
Battery Remaining Longevity: 37 mo
Battery Remaining Percentage: 61 %
Battery Voltage: 2.98 V
Brady Statistic AP VP Percent: 74 %
Brady Statistic AP VS Percent: 1 %
Brady Statistic AS VP Percent: 23 %
Brady Statistic AS VS Percent: 1 %
Brady Statistic RA Percent Paced: 71 %
Brady Statistic RV Percent Paced: 96 %
Date Time Interrogation Session: 20240619031936
Implantable Lead Connection Status: 753985
Implantable Lead Connection Status: 753985
Implantable Lead Implant Date: 20211222
Implantable Lead Implant Date: 20211222
Implantable Lead Location: 753859
Implantable Lead Location: 753860
Implantable Pulse Generator Implant Date: 20211222
Lead Channel Impedance Value: 450 Ohm
Lead Channel Impedance Value: 650 Ohm
Lead Channel Pacing Threshold Amplitude: 0.5 V
Lead Channel Pacing Threshold Amplitude: 0.75 V
Lead Channel Pacing Threshold Pulse Width: 0.5 ms
Lead Channel Pacing Threshold Pulse Width: 0.5 ms
Lead Channel Sensing Intrinsic Amplitude: 10 mV
Lead Channel Sensing Intrinsic Amplitude: 3.9 mV
Lead Channel Setting Pacing Amplitude: 3.5 V
Lead Channel Setting Pacing Amplitude: 3.5 V
Lead Channel Setting Pacing Pulse Width: 0.5 ms
Lead Channel Setting Sensing Sensitivity: 2 mV
Pulse Gen Model: 2272
Pulse Gen Serial Number: 3874736

## 2023-06-20 ENCOUNTER — Other Ambulatory Visit: Payer: Self-pay

## 2023-06-20 ENCOUNTER — Emergency Department

## 2023-06-20 ENCOUNTER — Emergency Department
Admission: EM | Admit: 2023-06-20 | Discharge: 2023-06-20 | Disposition: A | Discharge status: Home / Self Care | Attending: Emergency Medicine | Admitting: Emergency Medicine

## 2023-06-20 DIAGNOSIS — Z1152 Encounter for screening for COVID-19: Secondary | ICD-10-CM | POA: Insufficient documentation

## 2023-06-20 DIAGNOSIS — E119 Type 2 diabetes mellitus without complications: Secondary | ICD-10-CM | POA: Diagnosis not present

## 2023-06-20 DIAGNOSIS — I10 Essential (primary) hypertension: Secondary | ICD-10-CM | POA: Diagnosis not present

## 2023-06-20 DIAGNOSIS — J9801 Acute bronchospasm: Secondary | ICD-10-CM | POA: Insufficient documentation

## 2023-06-20 DIAGNOSIS — R0602 Shortness of breath: Secondary | ICD-10-CM | POA: Diagnosis present

## 2023-06-20 DIAGNOSIS — F17200 Nicotine dependence, unspecified, uncomplicated: Secondary | ICD-10-CM | POA: Diagnosis not present

## 2023-06-20 DIAGNOSIS — J449 Chronic obstructive pulmonary disease, unspecified: Secondary | ICD-10-CM | POA: Diagnosis not present

## 2023-06-20 DIAGNOSIS — R0789 Other chest pain: Secondary | ICD-10-CM

## 2023-06-20 DIAGNOSIS — Z7901 Long term (current) use of anticoagulants: Secondary | ICD-10-CM | POA: Diagnosis not present

## 2023-06-20 LAB — COMPREHENSIVE METABOLIC PANEL
ALT: 12 U/L (ref 0–44)
AST: 14 U/L — ABNORMAL LOW (ref 15–41)
Albumin: 3.9 g/dL (ref 3.5–5.0)
Alkaline Phosphatase: 176 U/L — ABNORMAL HIGH (ref 38–126)
Anion gap: 9 (ref 5–15)
BUN: 13 mg/dL (ref 8–23)
CO2: 23 mmol/L (ref 22–32)
Calcium: 9 mg/dL (ref 8.9–10.3)
Chloride: 105 mmol/L (ref 98–111)
Creatinine, Ser: 1.35 mg/dL — ABNORMAL HIGH (ref 0.61–1.24)
GFR, Estimated: 52 mL/min — ABNORMAL LOW (ref >60)
Glucose, Bld: 167 mg/dL — ABNORMAL HIGH (ref 70–99)
Potassium: 3.9 mmol/L (ref 3.5–5.1)
Sodium: 137 mmol/L (ref 135–145)
Total Bilirubin: 0.7 mg/dL (ref 0.3–1.2)
Total Protein: 6.3 g/dL — ABNORMAL LOW (ref 6.5–8.1)

## 2023-06-20 LAB — CBC WITH DIFFERENTIAL/PLATELET
Abs Immature Granulocytes: 0.03 10*3/uL (ref 0.00–0.07)
Basophils Absolute: 0 10*3/uL (ref 0.0–0.1)
Basophils Relative: 0 %
Eosinophils Absolute: 0.3 10*3/uL (ref 0.0–0.5)
Eosinophils Relative: 3 %
HCT: 40 % (ref 39.0–52.0)
Hemoglobin: 13.6 g/dL (ref 13.0–17.0)
Immature Granulocytes: 0 %
Lymphocytes Relative: 18 %
Lymphs Abs: 1.5 10*3/uL (ref 0.7–4.0)
MCH: 31.1 pg (ref 26.0–34.0)
MCHC: 34 g/dL (ref 30.0–36.0)
MCV: 91.3 fL (ref 80.0–100.0)
Monocytes Absolute: 0.7 10*3/uL (ref 0.1–1.0)
Monocytes Relative: 9 %
Neutro Abs: 5.5 10*3/uL (ref 1.7–7.7)
Neutrophils Relative %: 70 %
Platelets: 204 10*3/uL (ref 150–400)
RBC: 4.38 MIL/uL (ref 4.22–5.81)
RDW: 12.6 % (ref 11.5–15.5)
WBC: 8 10*3/uL (ref 4.0–10.5)
nRBC: 0 % (ref 0.0–0.2)

## 2023-06-20 LAB — SARS CORONAVIRUS 2 BY RT PCR: SARS Coronavirus 2 by RT PCR: NEGATIVE

## 2023-06-20 LAB — BRAIN NATRIURETIC PEPTIDE: B Natriuretic Peptide: 173.9 pg/mL — ABNORMAL HIGH (ref 0.0–100.0)

## 2023-06-20 LAB — TROPONIN I (HIGH SENSITIVITY)
Troponin I (High Sensitivity): 17 ng/L (ref <18)
Troponin I (High Sensitivity): 19 ng/L — ABNORMAL HIGH (ref <18)

## 2023-06-20 MED ORDER — ASPIRIN 81 MG PO CHEW
324.0000 mg | CHEWABLE_TABLET | Freq: Once | ORAL | Status: AC
  Administered 2023-06-20: 324 mg via ORAL
  Filled 2023-06-20: qty 4

## 2023-06-20 MED ORDER — CLONIDINE HCL 0.1 MG PO TABS
0.2000 mg | ORAL_TABLET | Freq: Once | ORAL | Status: AC
  Administered 2023-06-20: 0.2 mg via ORAL
  Filled 2023-06-20: qty 2

## 2023-06-20 NOTE — Discharge Instructions (Signed)
Monitor your blood pressure tonight.  The clonidine that you were given should help bring it down over the next few hours.  Restart taking all of your normal blood pressure and other medications tomorrow.  Make an appointment to follow-up with your primary care doctor.  Return to the ER immediately for new, worsening, or persistent severe chest pain, difficulty breathing, high blood pressure especially over 200 on the top number or 120 on the bottom number, or any other new or worsening symptoms that concern you.

## 2023-06-20 NOTE — ED Triage Notes (Signed)
Pt walking in garage when he developed chest pain 7/10. EMS gave one sublingual nitroglycerin which took it to 4/10. Pt supposed to wear oxygen but was not wearing when EMS came. EMS Vitals: BP 208/100 HR 60, NSR, pace maker 96% RA RR 16 CBG 200

## 2023-06-20 NOTE — ED Provider Notes (Signed)
[facility] Provider Note    Event Date/Time   First MD Initiated Contact with Patient 06/20/23 1600     (approximate)   History   Chest Pain   HPI  Zachary Santos is a 87 y.o. male with a history of COPD (normally on 2 L O2 at home), hypertension, diabetes, atrial fibrillation on Eliquis and tobacco use who presents with acute onset of shortness of breath while he was walking from his home to the garage, associated with chest pain.  The pain is in the middle of his chest and feels like something is "pushing out."  He states that he was not using his oxygen at the time.  He states he was feeling well earlier today.  He denies any cough or fever.  He has no vomiting or diarrhea.  He states he did feel somewhat lightheaded earlier but this has resolved.  He was given nitro spray with an improvement in the pain.  I reviewed the past medical records.  The patient was most recently admitted to the hospital service in November of last year for COPD exacerbation requiring supplementary O2.   Physical Exam   Triage Vital Signs: ED Triage Vitals  Encounter Vitals Group     BP --      Systolic BP Percentile --      Diastolic BP Percentile --      Pulse --      Resp --      Temp --      Temp src --      SpO2 --      Weight 06/20/23 1602 160 lb (72.6 kg)     Height 06/20/23 1602 6' (1.829 m)     Head Circumference --      Peak Flow --      Pain Score 06/20/23 1601 2     Pain Loc --      Pain Education --      Exclude from Growth Chart --     Most recent vital signs: Vitals:   06/20/23 1730 06/20/23 1800  BP: (!) 196/87 (!) 199/92  Pulse: 60 61  Resp: 19 17  Temp:    SpO2: 97% 98%     General: Awake, no distress.  CV:  Good peripheral perfusion.  Resp:  Normal effort.  Diminished breath sounds bilaterally with no wheezing or rales. Abd:  No distention.  Other:  No peripheral edema.   ED Results / Procedures / Treatments   Labs (all labs  ordered are listed, but only abnormal results are displayed) Labs Reviewed  COMPREHENSIVE METABOLIC PANEL - Abnormal; Notable for the following components:      Result Value   Glucose, Bld 167 (*)    Creatinine, Ser 1.35 (*)    Total Protein 6.3 (*)    AST 14 (*)    Alkaline Phosphatase 176 (*)    GFR, Estimated 52 (*)    All other components within normal limits  BRAIN NATRIURETIC PEPTIDE - Abnormal; Notable for the following components:   B Natriuretic Peptide 173.9 (*)    All other components within normal limits  TROPONIN I (HIGH SENSITIVITY) - Abnormal; Notable for the following components:   Troponin I (High Sensitivity) 19 (*)    All other components within normal limits  SARS CORONAVIRUS 2 BY RT PCR  CBC WITH DIFFERENTIAL/PLATELET  URINALYSIS, ROUTINE W REFLEX MICROSCOPIC  TROPONIN I (HIGH SENSITIVITY)     EKG  ED ECG REPORT  IDionne Bucy, the attending physician, personally viewed and interpreted this ECG.  Date: 06/20/2023 EKG Time: 1602 Rate: 67 Rhythm: Atrial sensed ventricular paced rhythm QRS Axis: normal ST/T Wave abnormalities: LVH with repolarization abnormality Narrative Interpretation: no evidence of acute ischemia: No significant change when compared EKG of 09/14/2022    RADIOLOGY  Chest x-ray: I independently viewed and interpreted the images; there is no focal consolidation or edema  PROCEDURES:  Critical Care performed: No  Procedures   MEDICATIONS ORDERED IN ED: Medications  cloNIDine (CATAPRES) tablet 0.2 mg (has no administration in time range)  aspirin chewable tablet 324 mg (324 mg Oral Given 06/20/23 1617)     IMPRESSION / MDM / ASSESSMENT AND PLAN / ED COURSE  I reviewed the triage vital signs and the nursing notes.  87 year old male with PMH as noted above presents with acute onset of shortness of breath and chest pain today while walking outside.  The pain is improved somewhat with nitroglycerin.  On exam the vital  signs are normal except for hypertension.  The patient has diminished breath sounds bilaterally with no wheezing or rales.  He is not in any acute respiratory distress.  There is no peripheral edema.  Differential diagnosis includes, but is not limited to, COPD exacerbation, acute bronchitis, bronchospasm, ACS, musculoskeletal pain, new onset CHF.  We will obtain chest x-ray, lab workup, and reassess.  I have ordered aspirin.  The patient is not requiring any oxygen currently.  Will give bronchodilators and steroid if his shortness of breath persist.  Patient's presentation is most consistent with acute presentation with potential threat to life or bodily function.  The patient is on the cardiac monitor to evaluate for evidence of arrhythmia and/or significant heart rate changes.  ----------------------------------------- 7:04 PM on 06/20/2023 -----------------------------------------  The patient's symptoms have completely resolved.  He is no longer short of breath.  He remains on room air with an O2 saturation in the high 90s.  He has no chest pain at this time.  He states he is feeling much better.  Workup is very reassuring.  Chest x-ray shows no acute findings.  COVID is negative.  Electrolytes are unremarkable.  CBC shows no leukocytosis.  Ischial and repeat troponin are minimally elevated consistent with the patient's baseline.  There is no evidence of ACS or other cardiac etiology at this time.  Overall I suspect that the patient had acute bronchospasm/reactive airways but is not having an ongoing COPD exacerbation.  Given his age and comorbidities I did consider whether he may benefit from inpatient admission and I offered this to the patient.  However, he states he is feeling much better and would strongly prefer to go home.  Given the negative workup I think that this is reasonable.  The blood pressure has been steadily increasing although the patient is asymptomatic.  He states he did  not take his blood pressure medications today.  I have ordered a dose of p.o. clonidine to bring it down.  I suggested that we wait until we can assess for this to work, however the patient wants to go home and just check his blood pressure at home tonight.  He is accompanied by a family member.  I think that this is a reasonable plan.  I gave the patient strict return precautions and instructed him to follow-up with his PMD; he expresses understanding and agreement.   FINAL CLINICAL IMPRESSION(S) / ED DIAGNOSES   Final diagnoses:  Atypical chest pain  Bronchospasm  Rx / DC Orders   ED Discharge Orders     None        Note:  This document was prepared using Dragon voice recognition software and may include unintentional dictation errors.    Dionne Bucy, MD 06/20/23 [phone]

## 2023-06-20 NOTE — ED Notes (Addendum)
Patient and family advised on return precautions with stated understanding. INT removed, cannula intact, pressure dressing applied. Patient stable and ambulatory with steady even gait on dispo.  Patient and family left without DNR form. Called and spoke with son, Calex Quirindongo, he stated he would come pick up the form tomorrow. Notified Erie Noe, Consulting civil engineer and stated she would have it ready for them to pick up.

## 2023-06-22 ENCOUNTER — Telehealth: Payer: Self-pay

## 2023-06-22 NOTE — Transitions of Care (Post Inpatient/ED Visit) (Signed)
Unable to reach pt or pts wife (DPR signed)no answer and no v/m. Home phone is not valid # per recording.. will try call another time.      06/22/2023  Name: Zachary Santos MRN: [id number] DOB: [birth year]  Today's TOC FU Call Status: Today's TOC FU Call Status:: Unsuccessful Call (1st Attempt) Unsuccessful Call (1st Attempt) Date: 06/22/23  Attempted to reach the patient regarding the most recent Inpatient/ED visit.  Follow Up Plan: Additional outreach attempts will be made to reach the patient to complete the Transitions of Care (Post Inpatient/ED visit) call.   Signature Lewanda Rife, LPN

## 2023-06-24 NOTE — Transitions of Care (Post Inpatient/ED Visit) (Signed)
Unable to reach pt by phone; one # invalid and other # no answer and no v/m set up.        06/24/2023  Name: Zachary Santos MRN: [id number] DOB: June 28, 1936  Today's TOC FU Call Status: Today's TOC FU Call Status:: Unsuccessful Call (2nd Attempt) Unsuccessful Call (1st Attempt) Date: 06/22/23 Unsuccessful Call (2nd Attempt) Date: 06/24/23  Attempted to reach the patient regarding the most recent Inpatient/ED visit.  Follow Up Plan: Additional outreach attempts will be made to reach the patient to complete the Transitions of Care (Post Inpatient/ED visit) call.   Signature Lewanda Rife, LPN

## 2023-07-06 ENCOUNTER — Ambulatory Visit (INDEPENDENT_AMBULATORY_CARE_PROVIDER_SITE_OTHER)

## 2023-07-06 DIAGNOSIS — I442 Atrioventricular block, complete: Secondary | ICD-10-CM

## 2023-07-06 LAB — CUP PACEART REMOTE DEVICE CHECK
Battery Remaining Longevity: 35 mo
Battery Remaining Percentage: 57 %
Battery Voltage: 2.96 V
Brady Statistic AP VP Percent: 74 %
Brady Statistic AP VS Percent: 1 %
Brady Statistic AS VP Percent: 23 %
Brady Statistic AS VS Percent: 1.1 %
Brady Statistic RA Percent Paced: 71 %
Brady Statistic RV Percent Paced: 96 %
Date Time Interrogation Session: 20240918020016
Implantable Lead Connection Status: 753985
Implantable Lead Connection Status: 753985
Implantable Lead Implant Date: 20211222
Implantable Lead Implant Date: 20211222
Implantable Lead Location: 753859
Implantable Lead Location: 753860
Implantable Pulse Generator Implant Date: 20211222
Lead Channel Impedance Value: 450 Ohm
Lead Channel Impedance Value: 610 Ohm
Lead Channel Pacing Threshold Amplitude: 0.5 V
Lead Channel Pacing Threshold Amplitude: 0.75 V
Lead Channel Pacing Threshold Pulse Width: 0.5 ms
Lead Channel Pacing Threshold Pulse Width: 0.5 ms
Lead Channel Sensing Intrinsic Amplitude: 3.2 mV
Lead Channel Sensing Intrinsic Amplitude: 9.2 mV
Lead Channel Setting Pacing Amplitude: 3.5 V
Lead Channel Setting Pacing Amplitude: 3.5 V
Lead Channel Setting Pacing Pulse Width: 0.5 ms
Lead Channel Setting Sensing Sensitivity: 2 mV
Pulse Gen Model: 2272
Pulse Gen Serial Number: 3874736

## 2023-07-12 NOTE — Progress Notes (Signed)
Remote pacemaker transmission.   

## 2023-07-20 NOTE — Progress Notes (Signed)
Remote pacemaker transmission.   

## 2023-09-25 ENCOUNTER — Other Ambulatory Visit: Payer: Self-pay

## 2023-09-25 ENCOUNTER — Emergency Department
Admission: EM | Admit: 2023-09-25 | Discharge: 2023-09-25 | Disposition: A | Discharge status: Home / Self Care | Payer: Medicare HMO | Attending: Emergency Medicine | Admitting: Emergency Medicine

## 2023-09-25 DIAGNOSIS — R739 Hyperglycemia, unspecified: Secondary | ICD-10-CM | POA: Insufficient documentation

## 2023-09-25 DIAGNOSIS — I10 Essential (primary) hypertension: Secondary | ICD-10-CM | POA: Insufficient documentation

## 2023-09-25 LAB — CBC
HCT: 43.6 % (ref 39.0–52.0)
Hemoglobin: 14.6 g/dL (ref 13.0–17.0)
MCH: 30.7 pg (ref 26.0–34.0)
MCHC: 33.5 g/dL (ref 30.0–36.0)
MCV: 91.8 fL (ref 80.0–100.0)
Platelets: 217 10*3/uL (ref 150–400)
RBC: 4.75 MIL/uL (ref 4.22–5.81)
RDW: 12.8 % (ref 11.5–15.5)
WBC: 7.8 10*3/uL (ref 4.0–10.5)
nRBC: 0 % (ref 0.0–0.2)

## 2023-09-25 LAB — BASIC METABOLIC PANEL
Anion gap: 9 (ref 5–15)
BUN: 12 mg/dL (ref 8–23)
CO2: 27 mmol/L (ref 22–32)
Calcium: 9.1 mg/dL (ref 8.9–10.3)
Chloride: 103 mmol/L (ref 98–111)
Creatinine, Ser: 1.32 mg/dL — ABNORMAL HIGH (ref 0.61–1.24)
GFR, Estimated: 53 mL/min — ABNORMAL LOW (ref >60)
Glucose, Bld: 203 mg/dL — ABNORMAL HIGH (ref 70–99)
Potassium: 4 mmol/L (ref 3.5–5.1)
Sodium: 139 mmol/L (ref 135–145)

## 2023-09-25 LAB — TROPONIN I (HIGH SENSITIVITY): Troponin I (High Sensitivity): 26 ng/L — ABNORMAL HIGH (ref <18)

## 2023-09-25 NOTE — ED Notes (Signed)
See triage notes. Patient c/o "a feeling on the back of the neck" that he says indicates hypertension in him. Patient had a BP of 212/110 with EMS. Patient reports not taking his medications as prescribed because he "feels like a walking pharmacy."

## 2023-09-25 NOTE — ED Triage Notes (Signed)
Elevated BP today.  Not currently taking BP meds as prescribed.  CBG-245-- also not taking meds.  Arrives from home via [facility].  Patient states he felt "a feeling on the back of neck" when BP is elevated.  Per EMS BP:212/110.  AAOx3.  Skin warm and dry . NAD.  States takes BP meds "once in a while".

## 2023-09-25 NOTE — Discharge Instructions (Signed)
Please start taking your medication as prescribed. Please seek medical attention for any high fevers, chest pain, shortness of breath, change in behavior, persistent vomiting, bloody stool or any other new or concerning symptoms.

## 2023-09-25 NOTE — Progress Notes (Signed)
Late Note Entry- Patient discharged before I could make a visit.   [facility] Patient  Current hospice patient who called 911 for transport to the hospital due to feeling light headed, dizzy and concerned about high blood pressure.  Hospice was not notified for visit or patient's concern- prior to EMS transport.  Patient's BP as reported by EMS was 212/110.  Patient reports to them that he is not consistent taking his meds due to pill burden.  Patient was treated and released because patient was feeling better, blood pressure improved and normal labs.  Report to Hospice IDT  Norris Cross, RN Nurse Liaison [phone]

## 2023-09-25 NOTE — ED Provider Notes (Signed)
[facility] Provider Note    Event Date/Time   First MD Initiated Contact with Patient 09/25/23 1609     (approximate)   History   Hypertension   HPI  Zachary Santos is a 87 y.o. male who presented to the emergency department today with primary concern for high blood pressure.  Patient states that he had felt lightheaded this afternoon.  He took his blood pressure and noted it to be elevated.  He does have a history of high blood pressure but states he does not take his medication.  Additionally he denies taking his metformin as well.  At the time my exam the patient is feeling better.    Physical Exam   Triage Vital Signs: ED Triage Vitals  Encounter Vitals Group     BP 09/25/23 1356 (!) 194/96     Systolic BP Percentile --      Diastolic BP Percentile --      Pulse Rate 09/25/23 1354 61     Resp 09/25/23 1354 16     Temp 09/25/23 1354 97.6 F (36.4 C)     Temp Source 09/25/23 1354 Oral     SpO2 09/25/23 1354 95 %     Weight 09/25/23 1356 160 lb 1.9 oz (72.6 kg)     Height --      Head Circumference --      Peak Flow --      Pain Score 09/25/23 1356 0     Pain Loc --      Pain Education --      Exclude from Growth Chart --     Most recent vital signs: Vitals:   09/25/23 1356 09/25/23 1609  BP: (!) 194/96 (!) 187/84  Pulse:  62  Resp:  17  Temp:    SpO2:  97%   General: Awake, alert, oriented. CV:  Good peripheral perfusion. Regular rate and rhythm. Resp:  Normal effort. Lungs clear. Abd:  No distention.    ED Results / Procedures / Treatments   Labs (all labs ordered are listed, but only abnormal results are displayed) Labs Reviewed  BASIC METABOLIC PANEL - Abnormal; Notable for the following components:      Result Value   Glucose, Bld 203 (*)    Creatinine, Ser 1.32 (*)    GFR, Estimated 53 (*)    All other components within normal limits  TROPONIN I (HIGH SENSITIVITY) - Abnormal; Notable for the following components:    Troponin I (High Sensitivity) 26 (*)    All other components within normal limits  CBC  TROPONIN I (HIGH SENSITIVITY)     EKG  I, Phineas Semen, attending physician, personally viewed and interpreted this EKG  EKG Time: 1406 Rate: 65 Rhythm: atrial sensed v paced rhythm Axis: normal Intervals: qtc 430 QRS: narrow ST changes: no st elevation Impression: abnormal ekg    RADIOLOGY None   PROCEDURES:  Critical Care performed: No   MEDICATIONS ORDERED IN ED: Medications - No data to display   IMPRESSION / MDM / ASSESSMENT AND PLAN / ED COURSE  I reviewed the triage vital signs and the nursing notes.                              Differential diagnosis includes, but is not limited to, medication non compliance, hypertension  Patient's presentation is most consistent with acute presentation with potential threat to life or bodily function.  Patient presented to the emergency department today with primary concern for some lightheadedness and high blood pressure.  At the time my exam his blood pressure had improved.  He did state that he felt better.  Blood work without concerning electrolyte abnormality.  EKG without concerning arrhythmia.  At this time I do think patient's hypertension likely the causes of his lightheadedness.  Did advise patient to start taking his medications as prescribed.      FINAL CLINICAL IMPRESSION(S) / ED DIAGNOSES   Final diagnoses:  Hypertension, unspecified type  Hyperglycemia     Note:  This document was prepared using Dragon voice recognition software and may include unintentional dictation errors.    Phineas Semen, MD 09/25/23 [id number]

## 2023-09-27 ENCOUNTER — Telehealth: Payer: Self-pay

## 2023-09-27 NOTE — Transitions of Care (Post Inpatient/ED Visit) (Signed)
Both of pts contact # have recordings call cannot be completed at this time. Will try again later.        09/27/2023  Name: Zachary Santos MRN: [id number] DOB: Apr 10, 1936  Today's TOC FU Call Status: Today's TOC FU Call Status:: Unsuccessful Call (1st Attempt) Unsuccessful Call (1st Attempt) Date: 09/27/23  Attempted to reach the patient regarding the most recent Inpatient/ED visit.  Follow Up Plan: Additional outreach attempts will be made to reach the patient to complete the Transitions of Care (Post Inpatient/ED visit) call.   Signature Lewanda Rife, LPN

## 2023-10-05 ENCOUNTER — Ambulatory Visit (INDEPENDENT_AMBULATORY_CARE_PROVIDER_SITE_OTHER): Payer: Medicare Other

## 2023-10-05 DIAGNOSIS — I442 Atrioventricular block, complete: Secondary | ICD-10-CM

## 2023-10-05 LAB — CUP PACEART REMOTE DEVICE CHECK
Battery Remaining Longevity: 32 mo
Battery Remaining Percentage: 53 %
Battery Voltage: 2.96 V
Brady Statistic AP VP Percent: 74 %
Brady Statistic AP VS Percent: 1 %
Brady Statistic AS VP Percent: 23 %
Brady Statistic AS VS Percent: 1.1 %
Brady Statistic RA Percent Paced: 71 %
Brady Statistic RV Percent Paced: 97 %
Date Time Interrogation Session: 20241218034036
Implantable Lead Connection Status: 753985
Implantable Lead Connection Status: 753985
Implantable Lead Implant Date: 20211222
Implantable Lead Implant Date: 20211222
Implantable Lead Location: 753859
Implantable Lead Location: 753860
Implantable Pulse Generator Implant Date: 20211222
Lead Channel Impedance Value: 450 Ohm
Lead Channel Impedance Value: 610 Ohm
Lead Channel Pacing Threshold Amplitude: 0.5 V
Lead Channel Pacing Threshold Amplitude: 0.75 V
Lead Channel Pacing Threshold Pulse Width: 0.5 ms
Lead Channel Pacing Threshold Pulse Width: 0.5 ms
Lead Channel Sensing Intrinsic Amplitude: 3.3 mV
Lead Channel Sensing Intrinsic Amplitude: 9.1 mV
Lead Channel Setting Pacing Amplitude: 3.5 V
Lead Channel Setting Pacing Amplitude: 3.5 V
Lead Channel Setting Pacing Pulse Width: 0.5 ms
Lead Channel Setting Sensing Sensitivity: 2 mV
Pulse Gen Model: 2272
Pulse Gen Serial Number: 3874736

## 2023-11-11 NOTE — Progress Notes (Signed)
Remote pacemaker transmission.

## 2024-01-04 ENCOUNTER — Ambulatory Visit (INDEPENDENT_AMBULATORY_CARE_PROVIDER_SITE_OTHER): Payer: Medicare Other

## 2024-01-04 DIAGNOSIS — I442 Atrioventricular block, complete: Secondary | ICD-10-CM | POA: Diagnosis not present

## 2024-01-05 LAB — CUP PACEART REMOTE DEVICE CHECK
Battery Remaining Longevity: 29 mo
Battery Remaining Percentage: 48 %
Battery Voltage: 2.96 V
Brady Statistic AP VP Percent: 75 %
Brady Statistic AP VS Percent: 1 %
Brady Statistic AS VP Percent: 22 %
Brady Statistic AS VS Percent: 1 %
Brady Statistic RA Percent Paced: 72 %
Brady Statistic RV Percent Paced: 97 %
Date Time Interrogation Session: 20250319020019
Implantable Lead Connection Status: 753985
Implantable Lead Connection Status: 753985
Implantable Lead Implant Date: 20211222
Implantable Lead Implant Date: 20211222
Implantable Lead Location: 753859
Implantable Lead Location: 753860
Implantable Pulse Generator Implant Date: 20211222
Lead Channel Impedance Value: 450 Ohm
Lead Channel Impedance Value: 610 Ohm
Lead Channel Pacing Threshold Amplitude: 0.5 V
Lead Channel Pacing Threshold Amplitude: 0.75 V
Lead Channel Pacing Threshold Pulse Width: 0.5 ms
Lead Channel Pacing Threshold Pulse Width: 0.5 ms
Lead Channel Sensing Intrinsic Amplitude: 12 mV
Lead Channel Sensing Intrinsic Amplitude: 3.5 mV
Lead Channel Setting Pacing Amplitude: 3.5 V
Lead Channel Setting Pacing Amplitude: 3.5 V
Lead Channel Setting Pacing Pulse Width: 0.5 ms
Lead Channel Setting Sensing Sensitivity: 2 mV
Pulse Gen Model: 2272
Pulse Gen Serial Number: 3874736

## 2024-02-17 NOTE — Progress Notes (Signed)
 Remote pacemaker transmission.

## 2024-02-17 NOTE — Addendum Note (Signed)
 Addended by: Lott Rouleau A on: 02/17/2024 11:06 AM   Modules accepted: Orders

## 2024-03-08 ENCOUNTER — Telehealth: Payer: Self-pay

## 2024-03-08 NOTE — Telephone Encounter (Signed)
 Patient has not been seen by Dr. Marven Slimmer since 12/2020.   He is still programmed at high outputs which is depleting battery significantly and is dependent.   We need to get patient re-established to care ASAP here in office.  Please put in appointment notes:  Adjust for chronic outputs   Forwarding to scheduling team to set up. Thanks.

## 2024-03-13 NOTE — Telephone Encounter (Signed)
 2nd call to schedule follow up with Dr. Marven Slimmer in [place], call made to both numbers on file - call can't be completed.

## 2024-03-15 NOTE — Telephone Encounter (Signed)
 3rd call to schedule follow up with Dr. Marven Slimmer in [place], call made to both numbers on file - cell can't be completed, home phone not in service. Lvmtcb w/ son Malcom (on [place]) to have patient call in to get scheduled.

## 2024-03-21 ENCOUNTER — Encounter: Payer: Self-pay | Admitting: Emergency Medicine

## 2024-03-21 NOTE — Telephone Encounter (Signed)
 Certified letter mailed to patient, in efforts to reach him, to schedule an appointment.

## 2024-04-04 ENCOUNTER — Ambulatory Visit (INDEPENDENT_AMBULATORY_CARE_PROVIDER_SITE_OTHER): Payer: Medicare Other

## 2024-04-04 ENCOUNTER — Ambulatory Visit: Payer: Self-pay | Admitting: Cardiology

## 2024-04-04 DIAGNOSIS — I442 Atrioventricular block, complete: Secondary | ICD-10-CM

## 2024-04-04 LAB — CUP PACEART REMOTE DEVICE CHECK
Battery Remaining Longevity: 26 mo
Battery Remaining Percentage: 44 %
Battery Voltage: 2.95 V
Brady Statistic AP VP Percent: 76 %
Brady Statistic AP VS Percent: 1 %
Brady Statistic AS VP Percent: 21 %
Brady Statistic AS VS Percent: 1 %
Brady Statistic RA Percent Paced: 73 %
Brady Statistic RV Percent Paced: 97 %
Date Time Interrogation Session: 20250618020020
Implantable Lead Connection Status: 753985
Implantable Lead Connection Status: 753985
Implantable Lead Implant Date: 20211222
Implantable Lead Implant Date: 20211222
Implantable Lead Location: 753859
Implantable Lead Location: 753860
Implantable Pulse Generator Implant Date: 20211222
Lead Channel Impedance Value: 450 Ohm
Lead Channel Impedance Value: 590 Ohm
Lead Channel Pacing Threshold Amplitude: 0.5 V
Lead Channel Pacing Threshold Amplitude: 0.75 V
Lead Channel Pacing Threshold Pulse Width: 0.5 ms
Lead Channel Pacing Threshold Pulse Width: 0.5 ms
Lead Channel Sensing Intrinsic Amplitude: 2.2 mV
Lead Channel Sensing Intrinsic Amplitude: 8.2 mV
Lead Channel Setting Pacing Amplitude: 3.5 V
Lead Channel Setting Pacing Amplitude: 3.5 V
Lead Channel Setting Pacing Pulse Width: 0.5 ms
Lead Channel Setting Sensing Sensitivity: 2 mV
Pulse Gen Model: 2272
Pulse Gen Serial Number: 3874736

## 2024-06-13 NOTE — Progress Notes (Signed)
 Remote pacemaker transmission.

## 2024-07-04 ENCOUNTER — Ambulatory Visit (INDEPENDENT_AMBULATORY_CARE_PROVIDER_SITE_OTHER): Payer: Medicare Other

## 2024-07-04 DIAGNOSIS — I442 Atrioventricular block, complete: Secondary | ICD-10-CM | POA: Diagnosis not present

## 2024-07-04 LAB — CUP PACEART REMOTE DEVICE CHECK
Battery Remaining Longevity: 23 mo
Battery Remaining Percentage: 39 %
Battery Voltage: 2.95 V
Brady Statistic AP VP Percent: 77 %
Brady Statistic AP VS Percent: 1 %
Brady Statistic AS VP Percent: 20 %
Brady Statistic AS VS Percent: 1 %
Brady Statistic RA Percent Paced: 75 %
Brady Statistic RV Percent Paced: 97 %
Date Time Interrogation Session: 20250917020015
Implantable Lead Connection Status: 753985
Implantable Lead Connection Status: 753985
Implantable Lead Implant Date: 20211222
Implantable Lead Implant Date: 20211222
Implantable Lead Location: 753859
Implantable Lead Location: 753860
Implantable Pulse Generator Implant Date: 20211222
Lead Channel Impedance Value: 440 Ohm
Lead Channel Impedance Value: 550 Ohm
Lead Channel Pacing Threshold Amplitude: 0.5 V
Lead Channel Pacing Threshold Amplitude: 0.75 V
Lead Channel Pacing Threshold Pulse Width: 0.5 ms
Lead Channel Pacing Threshold Pulse Width: 0.5 ms
Lead Channel Sensing Intrinsic Amplitude: 3.8 mV
Lead Channel Sensing Intrinsic Amplitude: 8.1 mV
Lead Channel Setting Pacing Amplitude: 3.5 V
Lead Channel Setting Pacing Amplitude: 3.5 V
Lead Channel Setting Pacing Pulse Width: 0.5 ms
Lead Channel Setting Sensing Sensitivity: 2 mV
Pulse Gen Model: 2272
Pulse Gen Serial Number: 3874736

## 2024-07-05 ENCOUNTER — Ambulatory Visit: Payer: Self-pay | Admitting: Cardiology

## 2024-07-09 NOTE — Progress Notes (Signed)
 Remote PPM Transmission

## 2024-09-21 ENCOUNTER — Emergency Department
Admission: EM | Admit: 2024-09-21 | Discharge: 2024-09-21 | Disposition: A | Discharge status: Home / Self Care | Attending: Emergency Medicine | Admitting: Emergency Medicine

## 2024-09-21 ENCOUNTER — Emergency Department

## 2024-09-21 DIAGNOSIS — B9689 Other specified bacterial agents as the cause of diseases classified elsewhere: Secondary | ICD-10-CM | POA: Diagnosis not present

## 2024-09-21 DIAGNOSIS — E279 Disorder of adrenal gland, unspecified: Secondary | ICD-10-CM | POA: Diagnosis not present

## 2024-09-21 DIAGNOSIS — N189 Chronic kidney disease, unspecified: Secondary | ICD-10-CM | POA: Insufficient documentation

## 2024-09-21 DIAGNOSIS — Z66 Do not resuscitate: Secondary | ICD-10-CM | POA: Diagnosis not present

## 2024-09-21 DIAGNOSIS — N390 Urinary tract infection, site not specified: Secondary | ICD-10-CM | POA: Insufficient documentation

## 2024-09-21 DIAGNOSIS — E119 Type 2 diabetes mellitus without complications: Secondary | ICD-10-CM | POA: Insufficient documentation

## 2024-09-21 DIAGNOSIS — R531 Weakness: Secondary | ICD-10-CM | POA: Diagnosis not present

## 2024-09-21 DIAGNOSIS — D72829 Elevated white blood cell count, unspecified: Secondary | ICD-10-CM | POA: Diagnosis not present

## 2024-09-21 DIAGNOSIS — I700 Atherosclerosis of aorta: Secondary | ICD-10-CM | POA: Diagnosis not present

## 2024-09-21 DIAGNOSIS — E1122 Type 2 diabetes mellitus with diabetic chronic kidney disease: Secondary | ICD-10-CM | POA: Diagnosis not present

## 2024-09-21 DIAGNOSIS — E860 Dehydration: Secondary | ICD-10-CM | POA: Insufficient documentation

## 2024-09-21 DIAGNOSIS — R112 Nausea with vomiting, unspecified: Secondary | ICD-10-CM | POA: Insufficient documentation

## 2024-09-21 DIAGNOSIS — J449 Chronic obstructive pulmonary disease, unspecified: Secondary | ICD-10-CM | POA: Diagnosis not present

## 2024-09-21 LAB — COMPREHENSIVE METABOLIC PANEL WITH GFR
ALT: 14 U/L (ref 0–44)
AST: 20 U/L (ref 15–41)
Albumin: 4.2 g/dL (ref 3.5–5.0)
Alkaline Phosphatase: 148 U/L — ABNORMAL HIGH (ref 38–126)
Anion gap: 13 (ref 5–15)
BUN: 34 mg/dL — ABNORMAL HIGH (ref 8–23)
CO2: 26 mmol/L (ref 22–32)
Calcium: 9.6 mg/dL (ref 8.9–10.3)
Chloride: 102 mmol/L (ref 98–111)
Creatinine, Ser: 1.81 mg/dL — ABNORMAL HIGH (ref 0.61–1.24)
GFR, Estimated: 36 mL/min — ABNORMAL LOW (ref >60)
Glucose, Bld: 223 mg/dL — ABNORMAL HIGH (ref 70–99)
Potassium: 4.8 mmol/L (ref 3.5–5.1)
Sodium: 141 mmol/L (ref 135–145)
Total Bilirubin: 0.4 mg/dL (ref 0.0–1.2)
Total Protein: 6.9 g/dL (ref 6.5–8.1)

## 2024-09-21 LAB — CBC
HCT: 35.4 % — ABNORMAL LOW (ref 39.0–52.0)
Hemoglobin: 11.8 g/dL — ABNORMAL LOW (ref 13.0–17.0)
MCH: 31.1 pg (ref 26.0–34.0)
MCHC: 33.3 g/dL (ref 30.0–36.0)
MCV: 93.4 fL (ref 80.0–100.0)
Platelets: 186 K/uL (ref 150–400)
RBC: 3.79 MIL/uL — ABNORMAL LOW (ref 4.22–5.81)
RDW: 13.8 % (ref 11.5–15.5)
WBC: 14.6 K/uL — ABNORMAL HIGH (ref 4.0–10.5)
nRBC: 0 % (ref 0.0–0.2)

## 2024-09-21 LAB — RESP PANEL BY RT-PCR (RSV, FLU A&B, COVID)  RVPGX2
Influenza A by PCR: NEGATIVE
Influenza B by PCR: NEGATIVE
Resp Syncytial Virus by PCR: NEGATIVE
SARS Coronavirus 2 by RT PCR: NEGATIVE

## 2024-09-21 LAB — URINALYSIS, COMPLETE (UACMP) WITH MICROSCOPIC
Bilirubin Urine: NEGATIVE
Glucose, UA: NEGATIVE mg/dL
Hgb urine dipstick: NEGATIVE
Ketones, ur: NEGATIVE mg/dL
Nitrite: NEGATIVE
Protein, ur: 30 mg/dL — AB
Specific Gravity, Urine: 1.016 (ref 1.005–1.030)
Squamous Epithelial / HPF: 0 /HPF (ref 0–5)
pH: 7 (ref 5.0–8.0)

## 2024-09-21 LAB — LIPASE, BLOOD: Lipase: 39 U/L (ref 11–51)

## 2024-09-21 LAB — TROPONIN T, HIGH SENSITIVITY
Troponin T High Sensitivity: 34 ng/L — ABNORMAL HIGH (ref 0–19)
Troponin T High Sensitivity: 37 ng/L — ABNORMAL HIGH (ref 0–19)

## 2024-09-21 MED ORDER — ONDANSETRON 4 MG PO TBDP: 4.0000 mg | ORAL_TABLET | Freq: Three times a day (TID) | ORAL | 0 refills | Status: AC | PRN

## 2024-09-21 MED ORDER — METOCLOPRAMIDE HCL 5 MG/ML IJ SOLN
10.0000 mg | Freq: Once | INTRAMUSCULAR | Status: AC
  Administered 2024-09-21: 10 mg via INTRAVENOUS
  Filled 2024-09-21: qty 2

## 2024-09-21 MED ORDER — SODIUM CHLORIDE 0.9 % IV BOLUS
1000.0000 mL | Freq: Once | INTRAVENOUS | Status: AC
  Administered 2024-09-21: 1000 mL via INTRAVENOUS

## 2024-09-21 MED ORDER — CEPHALEXIN 500 MG PO CAPS
500.0000 mg | ORAL_CAPSULE | Freq: Once | ORAL | Status: AC
  Administered 2024-09-21: 500 mg via ORAL
  Filled 2024-09-21: qty 1

## 2024-09-21 MED ORDER — SODIUM CHLORIDE 0.9 % IV BOLUS
500.0000 mL | Freq: Once | INTRAVENOUS | Status: AC
  Administered 2024-09-21: 500 mL via INTRAVENOUS

## 2024-09-21 MED ORDER — CEPHALEXIN 500 MG PO CAPS: 500.0000 mg | ORAL_CAPSULE | Freq: Two times a day (BID) | ORAL | 0 refills | Status: DC

## 2024-09-21 MED ORDER — ONDANSETRON HCL 4 MG/2ML IJ SOLN
4.0000 mg | Freq: Once | INTRAMUSCULAR | Status: AC
  Administered 2024-09-21: 4 mg via INTRAVENOUS
  Filled 2024-09-21: qty 2

## 2024-09-21 NOTE — ED Notes (Signed)
 Patient given crackers and ice water  to complete PO challenge at this time.

## 2024-09-21 NOTE — ED Notes (Signed)
 Pt verbalizes understanding of discharge instructions. Opportunity for questioning and answers were provided. Pt waiting for family to arrive for transportation.

## 2024-09-21 NOTE — ED Triage Notes (Signed)
 Pt to ED via [facility] From home c/o N/V and weakness. PT reports that this started approx 1 hour ago. 4 mg zofran  IV given by EMS. Pt is on hospice. AxO4. Ems reports that pt was too weak to stand upon arrival. Pt has a pacemaker.

## 2024-09-21 NOTE — Progress Notes (Signed)
 [facility] Patient  Mr. Zachary Santos is a current hospice patient followed for hypertensive heart disease who came to the hospital post episode of nausea and dizziness.  Please call with any hospice related questions or concerns.  Zachary Santos Na, RN Nurse Liaison [phone]

## 2024-09-21 NOTE — ED Notes (Signed)
 Catheter bag drained 200 ml of urine. Urine sample sent to lab.

## 2024-09-21 NOTE — ED Provider Notes (Addendum)
 [facility] Provider Note    Event Date/Time   First MD Initiated Contact with Patient 09/21/24 0719     (approximate)  History   Chief Complaint: Weakness, Nausea, and Emesis  HPI  Zachary Santos is a 88 y.o. male with a past medical history of anxiety, CKD, COPD, diabetes who presents to the emergency department for nausea vomiting and weakness.  According to the patient approximately 1 hour ago he became nauseated and had an episode of vomiting.  Received Zofran  by EMS.  Patient is reportedly on hospice has a DNR form with him.  Patient lives alone but per EMS was reportedly too weak to stand without assistance.  Patient denies any focal complaints.  Overall poor historian.  Physical Exam   Triage Vital Signs: ED Triage Vitals  Encounter Vitals Group     BP 09/21/24 0726 (!) 195/79     Girls Systolic BP Percentile --      Girls Diastolic BP Percentile --      Boys Systolic BP Percentile --      Boys Diastolic BP Percentile --      Pulse Rate 09/21/24 0726 60     Resp 09/21/24 0726 (!) 24     Temp 09/21/24 0726 (!) 97.5 F (36.4 C)     Temp Source 09/21/24 0726 Axillary     SpO2 09/21/24 0726 94 %     Weight 09/21/24 0724 144 lb (65.3 kg)     Height 09/21/24 0724 6' (1.829 m)     Head Circumference --      Peak Flow --      Pain Score 09/21/24 0724 0     Pain Loc --      Pain Education --      Exclude from Growth Chart --     Most recent vital signs: Vitals:   09/21/24 0726  BP: (!) 195/79  Pulse: 60  Resp: (!) 24  Temp: (!) 97.5 F (36.4 C)  SpO2: 94%    General: Awake, no distress.  CV:  Good peripheral perfusion.  Regular rate and rhythm  Resp:  Normal effort.  Equal breath sounds bilaterally.  Abd:  No distention.  Soft, nontender.  No rebound or guarding.  ED Results / Procedures / Treatments   EKG  EKG viewed and interpreted by myself shows a sinus rhythm at 60 bpm with a widened QRS, left axis deviation, nonspecific ST  changes.  Morphology most consistent with left bundle branch block.  MEDICATIONS ORDERED IN ED: Medications  sodium chloride  0.9 % bolus 1,000 mL (has no administration in time range)  ondansetron  (ZOFRAN ) injection 4 mg (has no administration in time range)     IMPRESSION / MDM / ASSESSMENT AND PLAN / ED COURSE  I reviewed the triage vital signs and the nursing notes.  Patient's presentation is most consistent with acute presentation with potential threat to life or bodily function.  Patient presents to the emergency department for nausea with an episode of vomiting just started this morning along with generalized weakness.  We will check labs including a cardiac enzyme we obtain an EKG.  Will obtain a urine sample as well as a viral panel.  Will IV hydrate does nausea medication and continue to closely monitor.  Patient agreeable to plan of care.  Patient's workup shows a reassuring CBC with a slight leukocytosis, overall reassuring chemistry just showing mild signs of dehydration.  Troponin slightly elevated but unchanged after 2 hours.  Respiratory panel negative.  Patient has a chronic indwelling Foley catheter.  Patient does have bacteria and states it is due to be exchanged later today.  I offered to exchange in the emergency department however the patient states he would rather wait and have his home health nurse do this.  Given the slight white count and WBCs with bacteria on the urine we will cover with antibiotics as a precaution.  Patient states he feels much better and is asking to be discharged home.  ----------------------------------------- 1:46 PM on 09/21/2024 ----------------------------------------- Patient had left the emergency department (was discharged), however while getting into the car he once again felt nauseated like he had to vomit so they brought him back to the emergency department to check back in.  Patient's lab work earlier today was overall reassuring mild  renal insufficiency we will dose a second 500 cc bolus of normal saline.  Urinalysis showed infection however this is a chronic indwelling Foley catheter.  Patient was started on Keflex .  Respiratory panel was negative.  Patient did have a mild white blood cell count.  Given the patient's persistent nausea vomiting/dry heaving we will obtain a CT scan of the abdomen we will treat with more nausea medication and fluids while awaiting CT results.  CT scan has resulted showing a right adrenal mass.  I discussed this finding with the patient and I spoke to general surgery who recommend outpatient follow-up.  Likely unrelated to today's presentation.  Patient is feeling much better from a nausea standpoint.  Will p.o. trial after the patient finishes his fluids.  As long as the patient continues to feel well anticipate discharge home.  FINAL CLINICAL IMPRESSION(S) / ED DIAGNOSES   Weakness Nausea Urinary infection   Note:  This document was prepared using Dragon voice recognition software and may include unintentional dictation errors.   Dorothyann Drivers, MD 09/21/24 1110    Dorothyann Drivers, MD 09/21/24 1549

## 2024-09-21 NOTE — ED Notes (Signed)
 Patient passed PO challenge. MD Bradler notified.

## 2024-09-21 NOTE — Discharge Instructions (Addendum)
 Please take antibiotic as prescribed for its entire course.  Please follow-up with your doctor this coming week for recheck/reevaluation.  Return to the emergency department for any symptoms personally concerning to yourself.  Please follow-up with general surgery regarding your right kidney mass as we discussed.  Please call the number provided to arrange a follow-up appointment this coming week.

## 2024-09-21 NOTE — ED Provider Notes (Signed)
 Emergency department handoff note  Care of this patient was signed out to me at the end of the previous provider shift.  All pertinent patient information was conveyed and all questions were answered.  Patient pending p.o. challenge which he has passed.  The patient has been reexamined and is ready to be discharged.  All diagnostic results have been reviewed and discussed with the patient/family.  Care plan has been outlined and the patient/family understands all current diagnoses, results, and treatment plans.  There are no new complaints, changes, or physical findings at this time.  All questions have been addressed and answered.  Patient was instructed to, and agrees to follow-up with their primary care physician as well as return to the emergency department if any new or worsening symptoms develop.   Cornell Bourbon K, MD 09/21/24 [phone]

## 2024-09-24 ENCOUNTER — Inpatient Hospital Stay
Admission: EM | Admit: 2024-09-24 | Discharge: 2024-09-27 | DRG: 698 | Disposition: A | Discharge status: Skilled Nursing Facility | Attending: Internal Medicine | Admitting: Internal Medicine

## 2024-09-24 ENCOUNTER — Emergency Department

## 2024-09-24 DIAGNOSIS — N390 Urinary tract infection, site not specified: Secondary | ICD-10-CM | POA: Diagnosis present

## 2024-09-24 DIAGNOSIS — F419 Anxiety disorder, unspecified: Secondary | ICD-10-CM | POA: Diagnosis present

## 2024-09-24 DIAGNOSIS — I10 Essential (primary) hypertension: Secondary | ICD-10-CM | POA: Diagnosis present

## 2024-09-24 DIAGNOSIS — R112 Nausea with vomiting, unspecified: Secondary | ICD-10-CM

## 2024-09-24 DIAGNOSIS — J449 Chronic obstructive pulmonary disease, unspecified: Secondary | ICD-10-CM | POA: Diagnosis present

## 2024-09-24 DIAGNOSIS — R42 Dizziness and giddiness: Secondary | ICD-10-CM

## 2024-09-24 DIAGNOSIS — N400 Enlarged prostate without lower urinary tract symptoms: Secondary | ICD-10-CM | POA: Diagnosis present

## 2024-09-24 DIAGNOSIS — E278 Other specified disorders of adrenal gland: Secondary | ICD-10-CM | POA: Diagnosis present

## 2024-09-24 DIAGNOSIS — N3000 Acute cystitis without hematuria: Secondary | ICD-10-CM

## 2024-09-24 DIAGNOSIS — I482 Chronic atrial fibrillation, unspecified: Secondary | ICD-10-CM | POA: Diagnosis present

## 2024-09-24 DIAGNOSIS — G9389 Other specified disorders of brain: Principal | ICD-10-CM

## 2024-09-24 LAB — CBC WITH DIFFERENTIAL/PLATELET
Abs Immature Granulocytes: 0.09 K/uL — ABNORMAL HIGH (ref 0.00–0.07)
Basophils Absolute: 0 K/uL (ref 0.0–0.1)
Basophils Relative: 0 %
Eosinophils Absolute: 0 K/uL (ref 0.0–0.5)
Eosinophils Relative: 0 %
HCT: 40.2 % (ref 39.0–52.0)
Hemoglobin: 13.3 g/dL (ref 13.0–17.0)
Immature Granulocytes: 1 %
Lymphocytes Relative: 4 %
Lymphs Abs: 0.7 K/uL (ref 0.7–4.0)
MCH: 31.4 pg (ref 26.0–34.0)
MCHC: 33.1 g/dL (ref 30.0–36.0)
MCV: 94.8 fL (ref 80.0–100.0)
Monocytes Absolute: 0.8 K/uL (ref 0.1–1.0)
Monocytes Relative: 5 %
Neutro Abs: 15.6 K/uL — ABNORMAL HIGH (ref 1.7–7.7)
Neutrophils Relative %: 90 %
Platelets: 221 K/uL (ref 150–400)
RBC: 4.24 MIL/uL (ref 4.22–5.81)
RDW: 13.2 % (ref 11.5–15.5)
WBC: 17.2 K/uL — ABNORMAL HIGH (ref 4.0–10.5)
nRBC: 0 % (ref 0.0–0.2)

## 2024-09-24 LAB — COMPREHENSIVE METABOLIC PANEL WITH GFR
ALT: 21 U/L (ref 0–44)
AST: 27 U/L (ref 15–41)
Albumin: 4.1 g/dL (ref 3.5–5.0)
Alkaline Phosphatase: 159 U/L — ABNORMAL HIGH (ref 38–126)
Anion gap: 21 — ABNORMAL HIGH (ref 5–15)
BUN: 31 mg/dL — ABNORMAL HIGH (ref 8–23)
CO2: 22 mmol/L (ref 22–32)
Calcium: 9.8 mg/dL (ref 8.9–10.3)
Chloride: 99 mmol/L (ref 98–111)
Creatinine, Ser: 1.84 mg/dL — ABNORMAL HIGH (ref 0.61–1.24)
GFR, Estimated: 35 mL/min — ABNORMAL LOW
Glucose, Bld: 140 mg/dL — ABNORMAL HIGH (ref 70–99)
Potassium: 4.9 mmol/L (ref 3.5–5.1)
Sodium: 142 mmol/L (ref 135–145)
Total Bilirubin: 0.7 mg/dL (ref 0.0–1.2)
Total Protein: 7 g/dL (ref 6.5–8.1)

## 2024-09-24 LAB — URINALYSIS, ROUTINE W REFLEX MICROSCOPIC
Bilirubin Urine: NEGATIVE
Glucose, UA: NEGATIVE mg/dL
Ketones, ur: 20 mg/dL — AB
Nitrite: NEGATIVE
Protein, ur: 100 mg/dL — AB
RBC / HPF: >50 RBC/hpf (ref 0–5)
Specific Gravity, Urine: 1.018 (ref 1.005–1.030)
Squamous Epithelial / HPF: 0 /HPF (ref 0–5)
WBC, UA: >50 WBC/hpf (ref 0–5)
pH: 5 (ref 5.0–8.0)

## 2024-09-24 LAB — LIPASE, BLOOD: Lipase: 14 U/L (ref 11–51)

## 2024-09-24 LAB — TROPONIN T, HIGH SENSITIVITY
Troponin T High Sensitivity: 40 ng/L — ABNORMAL HIGH (ref 0–19)
Troponin T High Sensitivity: 37 ng/L — ABNORMAL HIGH (ref 0–19)

## 2024-09-24 LAB — HEMOGLOBIN A1C
Hgb A1c MFr Bld: 7.2 % — ABNORMAL HIGH (ref 4.8–5.6)
Mean Plasma Glucose: 159.94 mg/dL

## 2024-09-24 LAB — GLUCOSE, CAPILLARY
Glucose-Capillary: 232 mg/dL — ABNORMAL HIGH (ref 70–99)
Glucose-Capillary: 145 mg/dL — ABNORMAL HIGH (ref 70–99)

## 2024-09-24 LAB — LACTIC ACID, PLASMA: Lactic Acid, Venous: 2.8 mmol/L (ref 0.5–1.9)

## 2024-09-24 LAB — RESP PANEL BY RT-PCR (RSV, FLU A&B, COVID)  RVPGX2
Influenza A by PCR: NEGATIVE
Influenza B by PCR: NEGATIVE
Resp Syncytial Virus by PCR: NEGATIVE
SARS Coronavirus 2 by RT PCR: NEGATIVE

## 2024-09-24 MED ORDER — ACETAMINOPHEN 650 MG RE SUPP: 650.0000 mg | Freq: Four times a day (QID) | RECTAL | Status: DC | PRN

## 2024-09-24 MED ORDER — SODIUM CHLORIDE 0.9 % IV SOLN
2.0000 g | Freq: Once | INTRAVENOUS | Status: AC
  Administered 2024-09-24: 2 g via INTRAVENOUS
  Filled 2024-09-24: qty 20

## 2024-09-24 MED ORDER — ROSUVASTATIN CALCIUM 20 MG PO TABS
20.0000 mg | ORAL_TABLET | Freq: Every day | ORAL | Status: DC
  Administered 2024-09-25 – 2024-09-26 (×2): 20 mg via ORAL
  Filled 2024-09-24 (×3): qty 1

## 2024-09-24 MED ORDER — MORPHINE SULFATE (PF) 2 MG/ML IV SOLN: 2.0000 mg | INTRAVENOUS | Status: DC | PRN

## 2024-09-24 MED ORDER — AMIODARONE HCL 200 MG PO TABS: 200.0000 mg | ORAL_TABLET | Freq: Every day | ORAL | Status: DC

## 2024-09-24 MED ORDER — APIXABAN 2.5 MG PO TABS
2.5000 mg | ORAL_TABLET | Freq: Two times a day (BID) | ORAL | Status: DC
  Administered 2024-09-24 – 2024-09-26 (×5): 2.5 mg via ORAL
  Filled 2024-09-24 (×6): qty 1

## 2024-09-24 MED ORDER — POLYETHYLENE GLYCOL 3350 17 G PO PACK: 17.0000 g | PACK | Freq: Every day | ORAL | Status: DC | PRN

## 2024-09-24 MED ORDER — ONDANSETRON HCL 4 MG/2ML IJ SOLN: 4.0000 mg | Freq: Four times a day (QID) | INTRAMUSCULAR | Status: DC | PRN

## 2024-09-24 MED ORDER — ALBUTEROL SULFATE (2.5 MG/3ML) 0.083% IN NEBU: 2.5000 mg | INHALATION_SOLUTION | Freq: Four times a day (QID) | RESPIRATORY_TRACT | Status: DC | PRN

## 2024-09-24 MED ORDER — INSULIN ASPART 100 UNIT/ML IJ SOLN: 0.0000 [IU] | Freq: Every day | INTRAMUSCULAR | Status: DC

## 2024-09-24 MED ORDER — OXYCODONE HCL 5 MG PO TABS: 5.0000 mg | ORAL_TABLET | ORAL | Status: DC | PRN

## 2024-09-24 MED ORDER — BUSPIRONE HCL 5 MG PO TABS: 10.0000 mg | ORAL_TABLET | Freq: Two times a day (BID) | ORAL | Status: AC | PRN

## 2024-09-24 MED ORDER — ENOXAPARIN SODIUM 30 MG/0.3ML IJ SOSY: 30.0000 mg | PREFILLED_SYRINGE | INTRAMUSCULAR | Status: DC

## 2024-09-24 MED ORDER — SODIUM CHLORIDE 0.9 % IV BOLUS
1000.0000 mL | Freq: Once | INTRAVENOUS | Status: AC
  Administered 2024-09-24: 1000 mL via INTRAVENOUS

## 2024-09-24 MED ORDER — MECLIZINE HCL 25 MG PO TABS: 12.5000 mg | ORAL_TABLET | Freq: Three times a day (TID) | ORAL | Status: DC | PRN

## 2024-09-24 MED ORDER — ONDANSETRON HCL 4 MG PO TABS: 4.0000 mg | ORAL_TABLET | Freq: Four times a day (QID) | ORAL | Status: DC | PRN

## 2024-09-24 MED ORDER — FINASTERIDE 5 MG PO TABS
5.0000 mg | ORAL_TABLET | Freq: Every day | ORAL | Status: DC
  Administered 2024-09-25 – 2024-09-26 (×2): 5 mg via ORAL
  Filled 2024-09-24 (×3): qty 1

## 2024-09-24 MED ORDER — SODIUM CHLORIDE 0.9 % IV SOLN
2.0000 g | INTRAVENOUS | Status: DC
  Administered 2024-09-25 – 2024-09-26 (×2): 2 g via INTRAVENOUS
  Filled 2024-09-24 (×2): qty 20

## 2024-09-24 MED ORDER — ACETAMINOPHEN 325 MG PO TABS: 650.0000 mg | ORAL_TABLET | Freq: Four times a day (QID) | ORAL | Status: DC | PRN

## 2024-09-24 MED ORDER — TAMSULOSIN HCL 0.4 MG PO CAPS
0.4000 mg | ORAL_CAPSULE | Freq: Every day | ORAL | Status: DC
  Administered 2024-09-25 – 2024-09-27 (×3): 0.4 mg via ORAL
  Filled 2024-09-24 (×3): qty 1

## 2024-09-24 MED ORDER — LISINOPRIL 10 MG PO TABS
10.0000 mg | ORAL_TABLET | Freq: Every day | ORAL | Status: DC
  Administered 2024-09-25 – 2024-09-26 (×2): 10 mg via ORAL
  Filled 2024-09-24 (×3): qty 1

## 2024-09-24 MED ORDER — INSULIN ASPART 100 UNIT/ML IJ SOLN
0.0000 [IU] | Freq: Three times a day (TID) | INTRAMUSCULAR | Status: DC
  Administered 2024-09-24: 3 [IU] via SUBCUTANEOUS
  Administered 2024-09-25: 2 [IU] via SUBCUTANEOUS
  Administered 2024-09-25: 1 [IU] via SUBCUTANEOUS
  Administered 2024-09-26: 2 [IU] via SUBCUTANEOUS
  Administered 2024-09-26: 3 [IU] via SUBCUTANEOUS
  Filled 2024-09-24: qty 1
  Filled 2024-09-24: qty 3
  Filled 2024-09-24 (×4): qty 2
  Filled 2024-09-24 (×2): qty 3

## 2024-09-24 MED ORDER — LACTATED RINGERS IV SOLN: INTRAVENOUS | Status: DC

## 2024-09-24 MED ORDER — AMLODIPINE BESYLATE 5 MG PO TABS: 5.0000 mg | ORAL_TABLET | Freq: Every day | ORAL | Status: DC

## 2024-09-24 NOTE — ED Notes (Signed)
 Pt states he uses 2 liters of oxygen  via [employer] at night.

## 2024-09-24 NOTE — ED Triage Notes (Signed)
 Pt presents to the ED via [facility] from home. EMS was called out for dizziness and N/V. Pt was here 3 days ago for same and was diagnosed with a UTI. Pt was prescribed antibiotics and it was only filled yesterday. Pt has a catheter in place.   170/85 RR 27 HR 60's - pacemaker 98% on RA - PRN order for oxygen  at home 98.1 axillary  ETCO2 18 CBG 132 - Hx of DM.  EMS gave 4mg  zofran 

## 2024-09-24 NOTE — ED Notes (Signed)
 Pt taken off bedpan and cleaned. Sacrum intact, perineum intact. Foley catheter remains in place, securing device noted- urine output appears clear and yellow.

## 2024-09-24 NOTE — Consult Note (Signed)
 Consulting Department:  Medicine department  Primary Physician:  Patient, No Pcp Per  Chief Complaint: Brain mass  History of Present Illness: 09/24/2024 TEVIN SHILLINGFORD is a 88 y.o. male who presents with the chief complaint of recent urinary tract infection, nausea vomiting, dizziness.  Brought into the emergency department and found to have a left-sided cerebellar lesion.  Also has history of recent urinary tract infection and nausea vomiting.  Denies any headache.  He does feel overall weak in his lower extremities.  Has had some vertiginous symptoms.  Does have a history of a left-sided microvascular decompression  Review of Systems:  A 10 point review of systems is negative, except for the pertinent positives and negatives detailed in the HPI.  Past Medical History: Past Medical History:  Diagnosis Date   Anxiety    BPH (benign prostatic hyperplasia)    Chronic fatigue    CKD (chronic kidney disease), stage III (HCC)    Claudication    a. R Hip.   COPD (chronic obstructive pulmonary disease) (HCC)    Dyspnea    with exertion   Essential hypertension    a. 01/2008 Echo: EF 65%, no rwma, mod increased PASP.   Hypercholesterolemia    Osteoarthritis    Paget's disease    Pneumonia due to COVID-19 virus 10/31/2020   Syncope    a. 03/2015 in setting of hypotension.   Tobacco abuse    a. 1-1.5 ppd x 60+ yrs.   Type II diabetes mellitus (HCC)    Wears partial dentures    uppers    Past Surgical History: Past Surgical History:  Procedure Laterality Date   APPENDECTOMY     CATARACT EXTRACTION W/PHACO Left 12/11/2019   Procedure: CATARACT EXTRACTION PHACO AND INTRAOCULAR LENS PLACEMENT (IOC) LEFT DIABETIC MALYUGIN;  Surgeon: Jaye Fallow, MD;  Location: [facility];  Service: Ophthalmology;  Laterality: Left;  CDE 11.45 U/S  1:05.7   CATARACT EXTRACTION W/PHACO Right 01/08/2020   Procedure: CATARACT EXTRACTION PHACO AND INTRAOCULAR LENS PLACEMENT (IOC) RIGHT  DIABETIC 5.75  00:49.3;  Surgeon: Jaye Fallow, MD;  Location: [facility];  Service: Ophthalmology;  Laterality: Right;  Diabetic   laparscopic chole     PACEMAKER IMPLANT N/A 10/08/2020   Procedure: PACEMAKER IMPLANT;  Surgeon: Cindie Ole DASEN, MD;  Location: [facility];  Service: Cardiovascular;  Laterality: N/A;   SPINE SURGERY     cervical spine surgery   trigeminal neuralgia     with surgery in [place]    Allergies: Allergies as of 09/24/2024 - Review Complete 09/24/2024  Allergen Reaction Noted   Other Rash 10/31/2020    Medications:  Current Facility-Administered Medications:    acetaminophen  (TYLENOL ) tablet 650 mg, 650 mg, Oral, Q6H PRN **OR** acetaminophen  (TYLENOL ) suppository 650 mg, 650 mg, Rectal, Q6H PRN, Paudel, Keshab, MD   albuterol  (PROVENTIL ) (2.5 MG/3ML) 0.083% nebulizer solution 2.5 mg, 2.5 mg, Inhalation, Q6H PRN, Paudel, Nena, MD   amiodarone  (PACERONE ) tablet 200 mg, 200 mg, Oral, Daily, Paudel, Keshab, MD   amLODipine  (NORVASC ) tablet 5 mg, 5 mg, Oral, Daily, Paudel, Keshab, MD   apixaban  (ELIQUIS ) tablet 2.5 mg, 2.5 mg, Oral, BID, Paudel, Keshab, MD   busPIRone  (BUSPAR ) tablet 10 mg, 10 mg, Oral, BID PRN, Paudel, Keshab, MD   [START ON 09/25/2024] cefTRIAXone  (ROCEPHIN ) 2 g in sodium chloride  0.9 % 100 mL IVPB, 2 g, Intravenous, Q24H, Paudel, Keshab, MD   finasteride  (PROSCAR ) tablet 5 mg, 5 mg, Oral, Daily, Paudel, Keshab, MD  insulin  aspart (novoLOG ) injection 0-5 Units, 0-5 Units, Subcutaneous, QHS, Paudel, Keshab, MD   insulin  aspart (novoLOG ) injection 0-9 Units, 0-9 Units, Subcutaneous, TID WC, Paudel, Keshab, MD, 3 Units at 09/24/24 1745   lisinopril  (ZESTRIL ) tablet 10 mg, 10 mg, Oral, Daily, Paudel, Keshab, MD   meclizine  (ANTIVERT ) tablet 12.5 mg, 12.5 mg, Oral, TID PRN, Paudel, Keshab, MD   morphine  (PF) 2 MG/ML injection 2 mg, 2 mg, Intravenous, Q4H PRN, Paudel, Keshab, MD   ondansetron  (ZOFRAN ) tablet 4 mg, 4 mg,  Oral, Q6H PRN **OR** ondansetron  (ZOFRAN ) injection 4 mg, 4 mg, Intravenous, Q6H PRN, Paudel, Keshab, MD   oxyCODONE  (Oxy IR/ROXICODONE ) immediate release tablet 5 mg, 5 mg, Oral, Q4H PRN, Paudel, Keshab, MD   polyethylene glycol (MIRALAX  / GLYCOLAX ) packet 17 g, 17 g, Oral, Daily PRN, Paudel, Keshab, MD   rosuvastatin  (CRESTOR ) tablet 20 mg, 20 mg, Oral, Daily, Paudel, Keshab, MD   tamsulosin  (FLOMAX ) capsule 0.4 mg, 0.4 mg, Oral, QPC supper, Paudel, Keshab, MD   Social History: Social History   Tobacco Use   Smoking status: Every Day    Current packs/day: 1.50    Average packs/day: 1.5 packs/day for 60.0 years (90.0 ttl pk-yrs)    Types: Cigarettes   Smokeless tobacco: Current  Substance Use Topics   Alcohol use: Not Currently    Alcohol/week: 0.0 standard drinks of alcohol    Comment: rare - 1% of the time.   Drug use: No    Family Medical History: Family History  Problem Relation Age of Onset   Cancer Mother        died @ 21.   COPD Father        died @ 58.   Diabetes Brother        died in early 21's.    Physical Examination: Vitals:   09/24/24 1515 09/24/24 1626  BP:  (!) 157/69  Pulse: 60 60  Resp:  20  Temp:  97.8 F (36.6 C)  SpO2: 99% 100%     General: Patient is well developed, well nourished, calm, collected, and in no apparent distress.  NEUROLOGICAL:  General: In no acute distress.   Awake, alert, oriented to person, place, and time.   Extraocular motions are intact, no clear nystagmus on this examination Antigravity in the bilateral upper and lower extremities, overall diffusely weak  Bilateral upper and lower extremity sensation is intact to light touch.  Imaging: CT HEAD WO CONTRAST ( ) Addendum Date: 09/24/2024 ** ADDENDUM #1 ** ADDENDUM: Findings were discussed with the ordering physician, Dr. Ernest, at 9:15 am 09/24/24. ---------------------------------------------------- Electronically signed by: Evalene Coho MD 09/24/2024 09:16 AM  EST RP Workstation: [id number]   Result Date: 09/24/2024 ** ORIGINAL REPORT** EXAM: CT HEAD WITHOUT CONTRAST 09/24/2024 08:57:21 AM TECHNIQUE: CT of the head was performed without the administration of intravenous contrast. Automated exposure control, iterative reconstruction, and/or weight based adjustment of the mA/kV was utilized to reduce the radiation dose to as low as reasonably achievable. COMPARISON: CT of the head dated 10/06/2020. CLINICAL HISTORY: Headache, neuro deficit. FINDINGS: BRAIN AND VENTRICLES: There is an ovoid circumscribed mass noted present centrally within the left cerebellar hemisphere, measuring approximately 2.6 x 2.5 x 1.9 cm. It appears increasing density along its periphery, particularly medially, which could be from hemorrhage or calcification. There is age-related atrophy and moderate periventricular and deep cerebral white matter disease present. The patient is again noted to be status post left suboccipital craniotomy. No evidence of acute infarct. No hydrocephalus. No  extra-axial collection. ORBITS: No acute abnormality. The patient is also status post bilateral lens replacement. SINUSES: No acute abnormality. SOFT TISSUES AND SKULL: No acute soft tissue abnormality. No skull fracture. Status post left suboccipital craniotomy. IMPRESSION: 1. Ovoid circumscribed mass centrally within the left cerebellar hemisphere, measuring approximately 2.6 x 2.5 x 1.9 cm, with increasing density along its periphery, possibly due to hemorrhage or calcification. Correlation with an MRI of the brain without and with gadolinium contrast is suggested. Neurosurgery consultation is also recommended. Correlation with a CT of the chest, abdomen and pelvis also might be considered to assess for possible primary neoplasm. 2. Age-related atrophy and moderate periventricular and deep cerebral white matter disease. 3. Status post left suboccipital craniotomy and bilateral lens replacement. Electronically  signed by: Evalene Coho MD 09/24/2024 09:07 AM EST RP Workstation: [id number]   DG Chest Portable 1 View Result Date: 09/24/2024 EXAM: 1 VIEW(S) XRAY OF THE CHEST 09/24/2024 08:51:56 AM COMPARISON: 06/20/2023 CLINICAL HISTORY: SOB FINDINGS: LUNGS AND PLEURA: No focal pulmonary opacity. No pleural effusion. No pneumothorax. HEART AND MEDIASTINUM: Left chest dual lead implantable cardiac device. Aortic atherosclerosis. BONES AND SOFT TISSUES: Degenerative changes of spine. ACDF hardware in partially imaged lower cervical spine. IMPRESSION: 1. No acute findings. Electronically signed by: Evalene Coho MD 09/24/2024 09:08 AM EST RP Workstation: [id number]     I have personally reviewed the images and agree with the above interpretation.  Labs:    Latest Ref Rng & Units 09/24/2024    8:29 AM 09/21/2024    7:20 AM 09/25/2023    2:08 PM  CBC  WBC 4.0 - 10.5 K/uL 17.2  14.6  7.8   Hemoglobin 13.0 - 17.0 g/dL 86.6  88.1  85.3   Hematocrit 39.0 - 52.0 % 40.2  35.4  43.6   Platelets 150 - 400 K/uL 221  186  217       Latest Ref Rng & Units 09/24/2024    8:29 AM 09/21/2024    7:20 AM 09/25/2023    2:08 PM  BMP  Glucose 70 - 99 mg/dL 859  776  796   BUN 8 - 23 mg/dL 31  34  12   Creatinine 0.61 - 1.24 mg/dL 8.15  8.18  8.67   Sodium 135 - 145 mmol/L 142  141  139   Potassium 3.5 - 5.1 mmol/L 4.9  4.8  4.0   Chloride 98 - 111 mmol/L 99  102  103   CO2 22 - 32 mmol/L 22  26  27    Calcium  8.9 - 10.3 mg/dL 9.8  9.6  9.1         Assessment and Plan: Mr. Okey is a pleasant 88 y.o. male with recent urinary tract infection, nausea vomiting and vertigo.  As part of his evaluation in the emergency department today he had a CT scan of his head which demonstrated a left-sided posterior fossa lesion.  He does have a history of left-sided trigeminal neuralgia treatment via microvascular decompression.  On physical examination there is no clear localizing signs.  Patient is not endorsing any headache.   It is not clear that his vertigo is from this cerebellar hemispheric lesion, could be secondary to his infection and dehydration as he is not currently experiencing the symptoms anymore.  There is very minimal edema noted presumably could be calcified meningioma versus a metastasis.  Patient is on hospice.  Patient not interested in going forward with an MRI at this point.  States that he would  not have any intervention done on this anyways.  I do not believe the nausea and vomiting is secondary to the cranial lesion, has no direct brainstem compression nor does he have any hydrocephalus. Will continue to be available if needed.   Penne MICAEL Sharps, MD/MSCR Dept. of Neurosurgery

## 2024-09-24 NOTE — Progress Notes (Signed)
 [facility] ED 4 [facility] hospital liaison note:  Mr. 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 Mr. Chapdelaine has an indwelling catheter and just started on oral antibiotics for UTI yesterday however he is still having uncontrolled dizziness and nausea/vomiting. [facility] was notified after hospitalization. He is currently being admitted for UTI and antibiotics. Per Dr. Lorenso with [facility] this is a related hospital admission. Patient is a DNR.  Patient verbalized feeling better after coming to ED and getting some IV fluids. He is found to have a UTI and has been started on IV antibiotics. Hospital MD initially requesting transfer to the [facility] however patient does not meet IPU criteria.   Mr. Vandyne is inpatient appropriate with need for diagnostic testing IV fluids and antibiotics.  Vital Signs- 97.6/60/18    177/78   spO2 97% room air  I&O-  not yet recorded Abnormal Labs- BUN 31, Creatinine 1.84, Alk Phos 159, GFR 35, Troponin 40, Lactic Acid 2.8, WBC 17.2, UTI + Diagnostics-   CT HEAD WITHOUT CONTRAST 09/24/2024 08:57:21 AM    IMPRESSION: 1. Ovoid circumscribed mass centrally within the left cerebellar hemisphere, measuring approximately 2.6 x 2.5 x 1.9 cm, with increasing density along its periphery, possibly due to hemorrhage or calcification. Correlation with an MRI of the brain without and with gadolinium contrast is suggested. Neurosurgery consultation is also recommended. Correlation with a CT of the chest, abdomen and pelvis also might be considered to assess for possible primary neoplasm. 2. Age-related atrophy and moderate periventricular and deep cerebral white matter disease. 3. Status post left suboccipital craniotomy and bilateral lens replacement.   Electronically signed by: Evalene Coho MD 09/24/2024 09:07 AM EST RP Workstation: [id number]  IV/PRN Meds- NS 1L bolus IV,  Rocephin  2 gram q24H Current plan as per H & P Dr. Nena Paudel 12.8.2025  Sepsis due to urinary tract infection. - Patient has lactic acidosis and leukocytosis. - He was started on ceftriaxone , urine and blood cultures have been sent - Continue ceftriaxone  and follow-up cultures.   2.  Cerebellar mass/dizziness/generalized weakness -Patient has cerebellar mass that could be contributing to dizziness. - This mass could be related to other primary source. - He has a right adrenal mass as well.  We are not sure whether this is related to adrenal or something else is there. - - Neurosurgery has been contacted by ED. - Patient and family did not want further intervention. - Will continue meclizine  and supportive care - Await for neurosurgical recommendation   3.  Right adrenal mass - Patient and family did not want any further intervention at this point.   4.  HTN/A-fib - Resume home medications - Patient already has pacemaker   5.  BPH - Patient has a chronic Foley catheter - Resume his home medications Proscar  and Flomax    6.  COPD without any exacerbation - Give oxygen  as needed to maintain saturation more than 90%  Discharge Planning- Ongoing, likely home once medically optimized Family Contact- talked with son at bedside IDT: Updated Goals of Care: DNR  Transfer summary and Med list sent to be placed under Media tab. Thank you for the opportunity to participate in this patient's care, please don't hesitate to call for any hospice related questions or concerns.  Hunter Seip, BSN, [facility] Liaison [phone]

## 2024-09-24 NOTE — ED Provider Notes (Signed)
 [facility] Provider Note    None    (approximate)   History   No chief complaint on file.   HPI  Zachary Santos is a 88 y.o. male  anxiety, CKD, COPD, diabetes who comes in with nausea vomiting, dizziness since 09/21/2024.  Patient received Zofran .  Patient has a DNR form.  Patient lives alone.  Review the notes were patient was seen on 12/5 he was given a liter of fluid, Zofran .  CBC shows slightly less leukocytosis.  Plan was to have the catheter exchanged but he stated that his home health nurse was going to do it later today so declined having it done today.  Patient was started on antibiotics out of precaution.  CT imaging was ordered showing a right adrenal mass to follow-up with outpatient and given he passed p.o. challenge was discharged.  Patient denies any syncopal symptoms.  Does report having a pacemaker.  Patient said antibiotics were not filled until yesterday and he reports taking 1 dose yesterday for his UTI.  Pt got 4 zofran  with EMS    Physical Exam   Triage Vital Signs: ED Triage Vitals  Encounter Vitals Group     BP      Girls Systolic BP Percentile      Girls Diastolic BP Percentile      Boys Systolic BP Percentile      Boys Diastolic BP Percentile      Pulse      Resp      Temp      Temp src      SpO2      Weight      Height      Head Circumference      Peak Flow      Pain Score      Pain Loc      Pain Education      Exclude from Growth Chart     Most recent vital signs: Vitals:   09/24/24 0819  BP: (!) 161/106  Pulse: 64  Resp: 20  Temp: 97.6 F (36.4 C)  SpO2: 96%     General: Awake, no distress.  CV:  Good peripheral perfusion.  Resp:  Normal effort.  Abd:  No distention.  Soft nontender Other:  Patient is somewhat tremulous but is able to do finger-to-nose bilaterally.  Moving all extremities alert and oriented x 4.   ED Results / Procedures / Treatments   Labs (all labs ordered are listed, but only  abnormal results are displayed) Labs Reviewed  URINE CULTURE  RESP PANEL BY RT-PCR (RSV, FLU A&B, COVID)  [id number]  CULTURE, BLOOD (ROUTINE X 2)  CULTURE, BLOOD (ROUTINE X 2)  CBC WITH DIFFERENTIAL/PLATELET  COMPREHENSIVE METABOLIC PANEL WITH GFR  LACTIC ACID, PLASMA  URINALYSIS, ROUTINE W REFLEX MICROSCOPIC  LIPASE, BLOOD  TROPONIN T, HIGH SENSITIVITY     EKG  My interpretation of EKG:  Ventricular paced rhythm with a rate of 63 without any ST elevation or T wave inversions, widened QRS secondary to pacing  RADIOLOGY I have reviewed the CT  personally and interpreted concern for circumscribed mass in the left cerebral hemisphere   PROCEDURES:  Critical Care performed: No  .1-3 Lead EKG Interpretation  Performed by: Ernest Ronal BRAVO, MD Authorized by: Ernest Ronal BRAVO, MD     Interpretation: normal     ECG rate:  60   ECG rate assessment: normal     Rhythm: sinus rhythm  Ectopy: none     Conduction: normal      MEDICATIONS ORDERED IN ED: Medications  sodium chloride  0.9 % bolus 1,000 mL (has no administration in time range)     IMPRESSION / MDM / ASSESSMENT AND PLAN / ED COURSE  I reviewed the triage vital signs and the nursing notes.   Patient's presentation is most consistent with acute presentation with potential threat to life or bodily function.   Patient comes in with concerns for dizziness, intractable nausea, vomiting, weakness.  Patient has some tremulous noted but denies any alcohol use.  CT imaging ordered of the abdomen recently without any acute findings ordered CT imaging of the head to evaluate for any type of mass, hemorrhage, stroke.  Patient out of the window for stroke code as has been dizzy for the past couple of days.  Lipase is normal CBC shows increasing white count.  CMP shows creatinine at baseline but elevated anion gap with glucose of 140.  Troponin slightly elevated but similar to a few days ago.  Lactate is elevated patient getting some  IV fluids.  Holding on full fluid resuscitation given patient's age and otherwise normal vital signs he does not meet sepsis criteria other than elevated white count.  I am going to cover with antibiotics due to concern for possible UTI but Foley will need to be replaced and urine will need to be sent.  IMPRESSION: 1. Ovoid circumscribed mass centrally within the left cerebellar hemisphere, measuring approximately 2.6 x 2.5 x 1.9 cm, with increasing density along its periphery, possibly due to hemorrhage or calcification. Correlation with an MRI of the brain without and with gadolinium contrast is suggested. Neurosurgery consultation is also recommended. Correlation with a CT of the chest, abdomen and pelvis also might be considered to assess for possible primary neoplasm. 2. Age-related atrophy and moderate periventricular and deep cerebral white matter disease. 3. Status post left suboccipital craniotomy and bilateral lens replacement.   Discussed the case with patient given it was reported the patient is on hospice.  Patient reports he is on hospice to get increased help in the home.  He denies ever being told that he has any type of cancer.  Patient states that he makes all of his own medical decisions.  He states that he does not think he would be interested in radiation, chemotherapy and most likely will prefer symptomatic, palliative treatment but he wants to discuss this further with his family.  Will discuss the case with neurosurgery to get their opinion to see if a MRI with and without would be beneficial but at this time patient seems to not be interested in further worku and of this.  He states that if this is time to go then he is leaning towards wanting to just treat his symptoms.  Regardless patient has now been here twice with weakness, nausea vomiting dizziness I do not feel the patient can be discharged home as I feel it is unsafe discharge.  I do feel like patient come in for these  further discussions with neurosurgery, oncology, palliative and discussions about goals of care with family.  He expressed understanding and felt comfortable with this plan.  All  The patient is on the cardiac monitor to evaluate for evidence of arrhythmia and/or significant heart rate changes.      FINAL CLINICAL IMPRESSION(S) / ED DIAGNOSES   Final diagnoses:  Brain mass  Dizziness  Nausea and vomiting, unspecified vomiting type  Acute cystitis without hematuria  Rx / DC Orders   ED Discharge Orders     None        Note:  This document was prepared using Dragon voice recognition software and may include unintentional dictation errors.   Ernest Ronal BRAVO, MD 09/24/24 [phone]

## 2024-09-24 NOTE — Progress Notes (Signed)
 [facility] Room ED 4 [facility] Liaison Note  This patient is currently followed by [facility].  [facility] will follow through discharge disposition.  Please call with any hospice questions.  [facility] Liaison [phone]

## 2024-09-24 NOTE — H&P (Signed)
 History and Physical    Zachary Santos FMW:[id number] DOB: 1935/10/30 DOA: 09/24/2024  DOS: the patient was seen and examined on 09/24/2024  PCP: Patient, No Pcp Per   Patient coming from: Home  I have personally briefly reviewed patient's old medical records in [facility]  Chief Complaint: Nausea vomiting and dizziness for the last 3 days.  HPI: Zachary Santos is a pleasant 88 y.o. male with medical history significant for COPD, diabetes, CKD, anxiety, BPH, chronic indwelling Foley catheter HTN, DM, HLD, PPM and recent diagnosis of right adrenal mass who came into ED complaining of nausea vomiting and dizziness for the last 3 days.  Patient was here at the emergency room 3 days ago where he was found to have urinary tract infection and was discharged home on oral antibiotics.  He did not fill his medication until yesterday.  Patient stated that since he went home he was not feeling well with continuous nausea vomiting and dizziness. Patient denies any fever, chills, chest pain, shortness of breath, palpitations, leg swelling.  ED Course: Upon arrival to the ED, patient is found to be hypertensive at 161/106, urine analysis was positive for UTI, COVID, flu, RSV was negative.  He had a leukocytosis at 17.2, lactic acid of 2.8.  Chest x-ray did not find any acute finding.  CT scan of the brain showed left cerebellar mass.  Patient was started on ceftriaxone , sent the blood and urine cultures and hospitalist service was consulted for evaluation for admission.  Review of Systems:  ROS  All other systems negative except as noted in the HPI.  Past Medical History:  Diagnosis Date   Anxiety    BPH (benign prostatic hyperplasia)    Chronic fatigue    CKD (chronic kidney disease), stage III (HCC)    Claudication    a. R Hip.   COPD (chronic obstructive pulmonary disease) (HCC)    Dyspnea    with exertion   Essential hypertension    a. 01/2008 Echo: EF 65%, no rwma, mod increased PASP.    Hypercholesterolemia    Osteoarthritis    Paget's disease    Pneumonia due to COVID-19 virus 10/31/2020   Syncope    a. 03/2015 in setting of hypotension.   Tobacco abuse    a. 1-1.5 ppd x 60+ yrs.   Type II diabetes mellitus (HCC)    Wears partial dentures    uppers    Past Surgical History:  Procedure Laterality Date   APPENDECTOMY     CATARACT EXTRACTION W/PHACO Left 12/11/2019   Procedure: CATARACT EXTRACTION PHACO AND INTRAOCULAR LENS PLACEMENT (IOC) LEFT DIABETIC MALYUGIN;  Surgeon: Jaye Fallow, MD;  Location: [facility];  Service: Ophthalmology;  Laterality: Left;  CDE 11.45 U/S  1:05.7   CATARACT EXTRACTION W/PHACO Right 01/08/2020   Procedure: CATARACT EXTRACTION PHACO AND INTRAOCULAR LENS PLACEMENT (IOC) RIGHT DIABETIC 5.75  00:49.3;  Surgeon: Jaye Fallow, MD;  Location: [facility];  Service: Ophthalmology;  Laterality: Right;  Diabetic   laparscopic chole     PACEMAKER IMPLANT N/A 10/08/2020   Procedure: PACEMAKER IMPLANT;  Surgeon: Cindie Ole DASEN, MD;  Location: [facility]IVE [facility];  Service: Cardiovascular;  Laterality: N/A;   SPINE SURGERY     cervical spine surgery   trigeminal neuralgia     with surgery in [place]     reports that he has been smoking cigarettes. He has a 90 pack-year smoking history. He uses smokeless tobacco. He reports that he  does not currently use alcohol. He reports that he does not use drugs.  Allergies  Allergen Reactions   Other Rash    After getting pacemaker. The medication applied to skin caused a bad rash     Family History  Problem Relation Age of Onset   Cancer Mother        died @ 40.   COPD Father        died @ 42.   Diabetes Brother        died in early 58's.    Prior to Admission medications   Medication Sig Start Date End Date Taking? Authorizing Provider  albuterol  (VENTOLIN  HFA) 108 (90 Base) MCG/ACT inhaler Inhale 2 puffs into the lungs every 6 (six) hours as needed for wheezing  or shortness of breath. 09/15/22   Alexander, Natalie, DO  amiodarone  (PACERONE ) 200 MG tablet Take 1 tablet (200 mg total) by mouth daily. 02/23/21   Darliss Rogue, MD  amLODipine  (NORVASC ) 5 MG tablet Take 5 mg by mouth daily.    [provider]  apixaban  (ELIQUIS ) 5 MG TABS tablet Take 1 tablet by mouth twice daily 02/16/22   Sonnenberg, Eric G, MD  azelastine  (ASTELIN ) 0.1 % nasal spray Place 2 sprays into both nostrils 2 (two) times daily. Use in each nostril as directed 04/17/21   Maribeth Camellia MATSU, MD  blood glucose meter kit and supplies KIT Dispense based on patient and insurance preference. Use up to four times daily as directed. (FOR ICD-9 250.00, 250.01). 09/16/20   Maribeth Camellia MATSU, MD  busPIRone  (BUSPAR ) 10 MG tablet Take 10 mg by mouth 2 (two) times daily as needed (prn). 04/09/22   [provider]  cephALEXin  (KEFLEX ) 500 MG capsule Take 1 capsule (500 mg total) by mouth 2 (two) times daily. 09/21/24   Dorothyann Drivers, MD  finasteride  (PROSCAR ) 5 MG tablet Take 1 tablet (5 mg total) by mouth daily. 07/20/21   Maribeth Camellia MATSU, MD  lisinopril  (ZESTRIL ) 10 MG tablet Take 10 mg by mouth daily.    [provider]  metFORMIN  (GLUCOPHAGE  XR) 500 MG 24 hr tablet Take 1 tablet (500 mg total) by mouth in the morning and at bedtime. 04/17/21   Maribeth Camellia MATSU, MD  ondansetron  (ZOFRAN -ODT) 4 MG disintegrating tablet Take 1 tablet (4 mg total) by mouth every 8 (eight) hours as needed for nausea or vomiting. 09/21/24   Dorothyann Drivers, MD  ONE TOUCH ULTRA TEST test strip USE TEST STRIP TO CHECK GLUCOSE AT LEAST ONCE DAILY 05/06/16   Cook, Jayce G, DO  rosuvastatin  (CRESTOR ) 20 MG tablet Take 1 tablet (20 mg total) by mouth daily. 04/17/21   Maribeth Camellia MATSU, MD  tamsulosin  (FLOMAX ) 0.4 MG CAPS capsule Take 0.4 mg by mouth daily after supper.    [provider]  umeclidinium-vilanterol (ANORO ELLIPTA ) 62.5-25 MCG/ACT AEPB Inhale 1 puff into the lungs daily.  09/15/22   Marsa Edelman, DO    Physical Exam: Vitals:   09/24/24 0930 09/24/24 1035 09/24/24 1130 09/24/24 1200  BP: (!) 186/85 (!) 168/94 (!) 174/77 (!) 182/80  Pulse: (!) 59 65 60 (!) 58  Resp: (!) 21 17 16    Temp:      TempSrc:      SpO2: 97% 98% 95% 95%  Weight:      Height:        Physical Exam   Constitutional: Alert, awake, calm, comfortable HEENT: Neck supple Respiratory: Clear to auscultation B/L, no wheezing, no rales.  Cardiovascular: Regular rate and rhythm, no murmurs / rubs / gallops. No extremity edema. 2+ pedal pulses. No carotid bruits.  Abdomen: Soft, no tenderness, Bowel sounds positive.  Musculoskeletal: no clubbing / cyanosis. Good ROM, no contractures. Normal muscle tone.  Skin: no rashes, lesions, ulcers. Neurologic: CN 2-12 grossly intact. Sensation intact, No focal deficit identified Psychiatric: Alert and oriented x 3. Normal mood.    Labs on Admission: I have personally reviewed following labs and imaging studies  CBC: Recent Labs  Lab 09/21/24 0720 09/24/24 0829  WBC 14.6* 17.2*  NEUTROABS  --  15.6*  HGB 11.8* 13.3  HCT 35.4* 40.2  MCV 93.4 94.8  PLT 186 221   Basic Metabolic Panel: Recent Labs  Lab 09/21/24 0720 09/24/24 0829  NA 141 142  K 4.8 4.9  CL 102 99  CO2 26 22  GLUCOSE 223* 140*  BUN 34* 31*  CREATININE 1.81* 1.84*  CALCIUM  9.6 9.8   GFR: Estimated Creatinine Clearance: 26.6 mL/min (A) (by C-G formula based on SCr of 1.84 mg/dL (H)). Liver Function Tests: Recent Labs  Lab 09/21/24 0720 09/24/24 0829  AST 20 27  ALT 14 21  ALKPHOS 148* 159*  BILITOT 0.4 0.7  PROT 6.9 7.0  ALBUMIN  4.2 4.1   Recent Labs  Lab 09/21/24 0720 09/24/24 0829  LIPASE 39 14   No results for input(s): AMMONIA in the last 168 hours. Coagulation Profile: No results for input(s): INR, PROTIME in the last 168 hours. Cardiac Enzymes: No results for input(s): CKTOTAL, CKMB, CKMBINDEX, TROPONINI, TROPONINIHS in  the last 168 hours. BNP (last 3 results) No results for input(s): BNP in the last 8760 hours. HbA1C: No results for input(s): HGBA1C in the last 72 hours. CBG: No results for input(s): GLUCAP in the last 168 hours. Lipid Profile: No results for input(s): CHOL, HDL, LDLCALC, TRIG, CHOLHDL, LDLDIRECT in the last 72 hours. Thyroid  Function Tests: No results for input(s): TSH, T4TOTAL, FREET4, T3FREE, THYROIDAB in the last 72 hours. Anemia Panel: No results for input(s): VITAMINB12, FOLATE, FERRITIN, TIBC, IRON, RETICCTPCT in the last 72 hours. Urine analysis:    Component Value Date/Time   COLORURINE YELLOW (A) 09/24/2024 0817   APPEARANCEUR TURBID (A) 09/24/2024 0817   APPEARANCEUR Clear 08/05/2014 2214   LABSPEC 1.018 09/24/2024 0817   LABSPEC 1.012 08/05/2014 2214   PHURINE 5.0 09/24/2024 0817   GLUCOSEU NEGATIVE 09/24/2024 0817   GLUCOSEU Negative 08/05/2014 2214   GLUCOSEU NEG mg/dL 95/81/7988 8184   HGBUR LARGE (A) 09/24/2024 0817   BILIRUBINUR NEGATIVE 09/24/2024 0817   BILIRUBINUR Negative 08/05/2014 2214   KETONESUR 20 (A) 09/24/2024 0817   PROTEINUR 100 (A) 09/24/2024 0817   UROBILINOGEN 0.2 02/02/2010 1815   NITRITE NEGATIVE 09/24/2024 0817   LEUKOCYTESUR LARGE (A) 09/24/2024 0817   LEUKOCYTESUR Negative 08/05/2014 2214    Radiological Exams on Admission: I have personally reviewed images CT HEAD WO CONTRAST ( ) Addendum Date: 09/24/2024  ADDENDUM #1  ADDENDUM: Findings were discussed with the ordering physician, Dr. Ernest, at 9:15 am 09/24/24. ---------------------------------------------------- Electronically signed by: Evalene Coho MD 09/24/2024 09:16 AM EST RP Workstation: [id number]   Result Date: 09/24/2024  ORIGINAL REPORT  EXAM: CT HEAD WITHOUT CONTRAST 09/24/2024 08:57:21 AM TECHNIQUE: CT of the head was performed without the administration of intravenous contrast. Automated exposure control, iterative  reconstruction, and/or weight based adjustment of the mA/kV was utilized to reduce the radiation dose to as low as reasonably achievable. COMPARISON: CT of the head dated 10/06/2020. CLINICAL HISTORY: Headache,  neuro deficit. FINDINGS: BRAIN AND VENTRICLES: There is an ovoid circumscribed mass noted present centrally within the left cerebellar hemisphere, measuring approximately 2.6 x 2.5 x 1.9 cm. It appears increasing density along its periphery, particularly medially, which could be from hemorrhage or calcification. There is age-related atrophy and moderate periventricular and deep cerebral white matter disease present. The patient is again noted to be status post left suboccipital craniotomy. No evidence of acute infarct. No hydrocephalus. No extra-axial collection. ORBITS: No acute abnormality. The patient is also status post bilateral lens replacement. SINUSES: No acute abnormality. SOFT TISSUES AND SKULL: No acute soft tissue abnormality. No skull fracture. Status post left suboccipital craniotomy. IMPRESSION: 1. Ovoid circumscribed mass centrally within the left cerebellar hemisphere, measuring approximately 2.6 x 2.5 x 1.9 cm, with increasing density along its periphery, possibly due to hemorrhage or calcification. Correlation with an MRI of the brain without and with gadolinium contrast is suggested. Neurosurgery consultation is also recommended. Correlation with a CT of the chest, abdomen and pelvis also might be considered to assess for possible primary neoplasm. 2. Age-related atrophy and moderate periventricular and deep cerebral white matter disease. 3. Status post left suboccipital craniotomy and bilateral lens replacement. Electronically signed by: Evalene Coho MD 09/24/2024 09:07 AM EST RP Workstation: [id number]   DG Chest Portable 1 View Result Date: 09/24/2024 EXAM: 1 VIEW(S) XRAY OF THE CHEST 09/24/2024 08:51:56 AM COMPARISON: 06/20/2023 CLINICAL HISTORY: SOB FINDINGS: LUNGS AND PLEURA:  No focal pulmonary opacity. No pleural effusion. No pneumothorax. HEART AND MEDIASTINUM: Left chest dual lead implantable cardiac device. Aortic atherosclerosis. BONES AND SOFT TISSUES: Degenerative changes of spine. ACDF hardware in partially imaged lower cervical spine. IMPRESSION: 1. No acute findings. Electronically signed by: Evalene Coho MD 09/24/2024 09:08 AM EST RP Workstation: [id number]   EKG: My personal interpretation of EKG shows: Ventricular paced rhythm    Assessment/Plan Principal Problem:   UTI (urinary tract infection) Active Problems:   Essential hypertension, benign   COPD (chronic obstructive pulmonary disease) (HCC)   BPH (benign prostatic hyperplasia)   Anxiety and depression   Atrial fibrillation, chronic (HCC)   Right adrenal mass   Cerebellar mass   Dizziness    Assessment and Plan: 88 year old male with multiple medical problems including but not limited to COPD not on home oxygen , HTN, A-fib, anxiety/depression, BPH on chronic Foley catheter, right adrenal mass who came into ED complaining of dizziness, nausea vomiting and not feeling well.  Patient is a currently on [facility] care hospice  1.  Sepsis due to urinary tract infection. - Patient has lactic acidosis and leukocytosis. - He was started on ceftriaxone , urine and blood cultures have been sent - Continue ceftriaxone  and follow-up cultures.  2.  Cerebellar mass/dizziness/generalized weakness -Patient has cerebellar mass that could be contributing to dizziness. - This mass could be related to other primary source. - He has a right adrenal mass as well.  We are not sure whether this is related to adrenal or something else is there. - - Neurosurgery has been contacted by ED. - Patient and family did not want further intervention. - Will continue meclizine  and supportive care - Await for neurosurgical recommendation  3.  Right adrenal mass - Patient and family did not want any further  intervention at this point.  4.  HTN/A-fib - Resume home medications - Patient already has pacemaker  5.  BPH - Patient has a chronic Foley catheter - Resume his home medications Proscar  and Flomax   6.  COPD without any  exacerbation - Give oxygen  as needed to maintain saturation more than 90%      DVT prophylaxis: Lovenox  Code Status: DNR/DNI(Do NOT Intubate) Family Communication: Patient's son Lee/Malcolm was at bedside Disposition Plan: Home with hospice versus nursing home with hospice Consults called: Neurosurgery Admission status: Observation, Med-Surg   Nena Rebel, MD [facility] 09/24/2024, 12:48 PM

## 2024-09-24 NOTE — ED Notes (Signed)
 Called CCMD for central monitoring at this time

## 2024-09-24 NOTE — Plan of Care (Signed)

## 2024-09-25 DIAGNOSIS — G4700 Insomnia, unspecified: Secondary | ICD-10-CM | POA: Diagnosis present

## 2024-09-25 DIAGNOSIS — Z1152 Encounter for screening for COVID-19: Secondary | ICD-10-CM | POA: Diagnosis not present

## 2024-09-25 DIAGNOSIS — N400 Enlarged prostate without lower urinary tract symptoms: Secondary | ICD-10-CM | POA: Diagnosis present

## 2024-09-25 DIAGNOSIS — E7800 Pure hypercholesterolemia, unspecified: Secondary | ICD-10-CM | POA: Diagnosis present

## 2024-09-25 DIAGNOSIS — Y846 Urinary catheterization as the cause of abnormal reaction of the patient, or of later complication, without mention of misadventure at the time of the procedure: Secondary | ICD-10-CM | POA: Diagnosis present

## 2024-09-25 DIAGNOSIS — Z8616 Personal history of COVID-19: Secondary | ICD-10-CM | POA: Diagnosis not present

## 2024-09-25 DIAGNOSIS — N390 Urinary tract infection, site not specified: Secondary | ICD-10-CM | POA: Diagnosis present

## 2024-09-25 DIAGNOSIS — J449 Chronic obstructive pulmonary disease, unspecified: Secondary | ICD-10-CM | POA: Diagnosis present

## 2024-09-25 DIAGNOSIS — F419 Anxiety disorder, unspecified: Secondary | ICD-10-CM | POA: Diagnosis present

## 2024-09-25 DIAGNOSIS — R42 Dizziness and giddiness: Secondary | ICD-10-CM | POA: Diagnosis present

## 2024-09-25 DIAGNOSIS — F32A Depression, unspecified: Secondary | ICD-10-CM | POA: Diagnosis present

## 2024-09-25 DIAGNOSIS — F17210 Nicotine dependence, cigarettes, uncomplicated: Secondary | ICD-10-CM | POA: Diagnosis present

## 2024-09-25 DIAGNOSIS — Z515 Encounter for palliative care: Secondary | ICD-10-CM | POA: Diagnosis not present

## 2024-09-25 DIAGNOSIS — Z7901 Long term (current) use of anticoagulants: Secondary | ICD-10-CM | POA: Diagnosis not present

## 2024-09-25 DIAGNOSIS — E1151 Type 2 diabetes mellitus with diabetic peripheral angiopathy without gangrene: Secondary | ICD-10-CM | POA: Diagnosis present

## 2024-09-25 DIAGNOSIS — G9389 Other specified disorders of brain: Secondary | ICD-10-CM | POA: Diagnosis present

## 2024-09-25 DIAGNOSIS — N1832 Chronic kidney disease, stage 3b: Secondary | ICD-10-CM | POA: Diagnosis present

## 2024-09-25 DIAGNOSIS — I482 Chronic atrial fibrillation, unspecified: Secondary | ICD-10-CM | POA: Diagnosis present

## 2024-09-25 DIAGNOSIS — A419 Sepsis, unspecified organism: Secondary | ICD-10-CM | POA: Diagnosis present

## 2024-09-25 DIAGNOSIS — I129 Hypertensive chronic kidney disease with stage 1 through stage 4 chronic kidney disease, or unspecified chronic kidney disease: Secondary | ICD-10-CM | POA: Diagnosis present

## 2024-09-25 DIAGNOSIS — T83518A Infection and inflammatory reaction due to other urinary catheter, initial encounter: Secondary | ICD-10-CM | POA: Diagnosis present

## 2024-09-25 DIAGNOSIS — J432 Centrilobular emphysema: Secondary | ICD-10-CM | POA: Diagnosis not present

## 2024-09-25 DIAGNOSIS — E1122 Type 2 diabetes mellitus with diabetic chronic kidney disease: Secondary | ICD-10-CM | POA: Diagnosis present

## 2024-09-25 DIAGNOSIS — Z66 Do not resuscitate: Secondary | ICD-10-CM | POA: Diagnosis present

## 2024-09-25 DIAGNOSIS — E860 Dehydration: Secondary | ICD-10-CM | POA: Diagnosis present

## 2024-09-25 DIAGNOSIS — Z7984 Long term (current) use of oral hypoglycemic drugs: Secondary | ICD-10-CM | POA: Diagnosis not present

## 2024-09-25 DIAGNOSIS — N3000 Acute cystitis without hematuria: Secondary | ICD-10-CM | POA: Diagnosis not present

## 2024-09-25 DIAGNOSIS — E872 Acidosis, unspecified: Secondary | ICD-10-CM | POA: Diagnosis present

## 2024-09-25 LAB — URINE CULTURE

## 2024-09-25 LAB — COMPREHENSIVE METABOLIC PANEL WITH GFR
ALT: 14 U/L (ref 0–44)
AST: 17 U/L (ref 15–41)
Albumin: 3.5 g/dL (ref 3.5–5.0)
Alkaline Phosphatase: 123 U/L (ref 38–126)
Anion gap: 8 (ref 5–15)
BUN: 30 mg/dL — ABNORMAL HIGH (ref 8–23)
CO2: 27 mmol/L (ref 22–32)
Calcium: 9 mg/dL (ref 8.9–10.3)
Chloride: 103 mmol/L (ref 98–111)
Creatinine, Ser: 1.61 mg/dL — ABNORMAL HIGH (ref 0.61–1.24)
GFR, Estimated: 41 mL/min — ABNORMAL LOW (ref >60)
Glucose, Bld: 156 mg/dL — ABNORMAL HIGH (ref 70–99)
Potassium: 3.9 mmol/L (ref 3.5–5.1)
Sodium: 138 mmol/L (ref 135–145)
Total Bilirubin: 0.3 mg/dL (ref 0.0–1.2)
Total Protein: 5.7 g/dL — ABNORMAL LOW (ref 6.5–8.1)

## 2024-09-25 LAB — GLUCOSE, CAPILLARY
Glucose-Capillary: 141 mg/dL — ABNORMAL HIGH (ref 70–99)
Glucose-Capillary: 92 mg/dL (ref 70–99)
Glucose-Capillary: 185 mg/dL — ABNORMAL HIGH (ref 70–99)
Glucose-Capillary: 224 mg/dL — ABNORMAL HIGH (ref 70–99)

## 2024-09-25 LAB — CBC
HCT: 33.9 % — ABNORMAL LOW (ref 39.0–52.0)
Hemoglobin: 11.4 g/dL — ABNORMAL LOW (ref 13.0–17.0)
MCH: 31 pg (ref 26.0–34.0)
MCHC: 33.6 g/dL (ref 30.0–36.0)
MCV: 92.1 fL (ref 80.0–100.0)
Platelets: 184 K/uL (ref 150–400)
RBC: 3.68 MIL/uL — ABNORMAL LOW (ref 4.22–5.81)
RDW: 13.2 % (ref 11.5–15.5)
WBC: 13.8 K/uL — ABNORMAL HIGH (ref 4.0–10.5)
nRBC: 0 % (ref 0.0–0.2)

## 2024-09-25 LAB — PROTIME-INR
INR: 1.3 — ABNORMAL HIGH (ref 0.8–1.2)
Prothrombin Time: 16.7 s — ABNORMAL HIGH (ref 11.4–15.2)

## 2024-09-25 MED ORDER — CHLORHEXIDINE GLUCONATE CLOTH 2 % EX PADS
6.0000 | MEDICATED_PAD | Freq: Every day | CUTANEOUS | Status: DC
  Administered 2024-09-25 – 2024-09-27 (×3): 6 via TOPICAL

## 2024-09-25 MED ORDER — CHLORHEXIDINE GLUCONATE CLOTH 2 % EX PADS: 6.0000 | MEDICATED_PAD | Freq: Every day | CUTANEOUS | Status: DC

## 2024-09-25 MED ORDER — AMLODIPINE BESYLATE 10 MG PO TABS
10.0000 mg | ORAL_TABLET | Freq: Every day | ORAL | Status: DC
  Administered 2024-09-25 – 2024-09-26 (×2): 10 mg via ORAL
  Filled 2024-09-25 (×3): qty 1

## 2024-09-25 MED ORDER — METOPROLOL TARTRATE 25 MG PO TABS
25.0000 mg | ORAL_TABLET | Freq: Two times a day (BID) | ORAL | Status: AC
  Administered 2024-09-25 – 2024-09-26 (×4): 25 mg via ORAL
  Filled 2024-09-25 (×5): qty 1

## 2024-09-25 NOTE — Progress Notes (Signed)
 OT Cancellation Note  Patient Details Name: JAQUON GINGERICH MRN: [id number] DOB: 05-26-1936   Cancelled Treatment:    Reason Eval/Treat Not Completed: Other (comment). Pt adamantly refusing to get OOB at this time, states he had already been up to the bathroom earlier this date. Reports he will be going to a rest home on hospice. OT to follow POC and follow up tomorrow as appropriate.  Shermaine Brigham E Chrismon 09/25/2024, 4:28 PM

## 2024-09-25 NOTE — TOC Initial Note (Signed)
 Transition of Care [facility]) - Initial/Assessment Note    Patient Details  Name: Zachary Santos MRN: [id number] Date of Birth: 03-23-1936  Transition of Care [facility]) CM/SW Contact:    Daved JONETTA Hamilton, RN Phone Number: 09/25/2024, 8:26 AM  Clinical Narrative:                  Patient is current with [facility], [facility] following patient through discharge disposition. No TOC needs identified at this time however, TOC will continue to follow through hospital course.  Expected Discharge Plan: Home w Hospice Care (Patient is current with [facility]) Barriers to Discharge: Continued Medical Work up   Patient Goals and CMS Choice            Expected Discharge Plan and Services       Living arrangements for the past 2 months: Single Family Home                                      Prior Living Arrangements/Services Living arrangements for the past 2 months: Single Family Home   Patient language and need for interpreter reviewed:: Yes        Need for Family Participation in Patient Care: No (Comment) Care giver support system in place?: Yes (comment) Current home services: Hospice (patient is current with [facility]) Criminal Activity/Legal Involvement Pertinent to Current Situation/Hospitalization: No - Comment as needed  Activities of Daily Living   ADL Screening (condition at time of admission) Independently performs ADLs?: Yes (appropriate for developmental age) Is the patient deaf or have difficulty hearing?: No Does the patient have difficulty seeing, even when wearing glasses/contacts?: No Does the patient have difficulty concentrating, remembering, or making decisions?: No  Permission Sought/Granted                  Emotional Assessment       Orientation: : Oriented to Self, Oriented to Place, Oriented to  Time, Oriented to Situation Alcohol / Substance Use: Not Applicable Psych Involvement: No (comment)  Admission  diagnosis:  Dizziness [R42] UTI (urinary tract infection) [N39.0] Brain mass [G93.89] Acute cystitis without hematuria [N30.00] Nausea and vomiting, unspecified vomiting type [R11.2] Patient Active Problem List   Diagnosis Date Noted   UTI (urinary tract infection) 09/24/2024   Right adrenal mass 09/24/2024   Cerebellar mass 09/24/2024   Dizziness 09/24/2024   COPD exacerbation (HCC) 09/14/2022   Hypokalemia 09/14/2022   Elevated lactic acid level 09/14/2022   Glaucoma 04/17/2021   Restless leg 04/17/2021   Rhinorrhea 04/17/2021   PND (paroxysmal nocturnal dyspnea) 04/17/2021   Mixed diabetic hyperlipidemia associated with type 2 diabetes mellitus (HCC) 10/31/2020   Nicotine  dependence, cigarettes, uncomplicated 10/31/2020   Rash 10/20/2020   Bilateral leg weakness 10/20/2020   Cardiac syncope 10/07/2020   Complete heart block (HCC)    Musculoskeletal chest pain 08/18/2020   Atherosclerosis 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 (HCC) 06/06/2020   Atrial fibrillation, chronic (HCC) 05/27/2020   Blue toes 05/27/2020   Insomnia 11/23/2019   Anxiety and depression 07/17/2019   Erectile dysfunction 08/29/2018   Elevated alkaline phosphatase level 08/02/2017   Fatigue 05/02/2017   Weight loss 05/02/2017   Dyslipidemia 11/01/2016   Right leg claudication 06/20/2015   Chronic kidney disease, stage 3a (HCC)    Type 2 diabetes mellitus with hyperglycemia, without long-term current use of insulin  (HCC) 09/12/2013   Osteoarthritis of  right knee 06/07/2013   BPH (benign prostatic hyperplasia) 02/02/2010   Essential hypertension, benign 02/05/2008   COPD (chronic obstructive pulmonary disease) (HCC) 02/05/2008   Paget's disease of bone 02/05/2008   PCP:  Patient, No Pcp Per Pharmacy:   [facility] [address]), [place] - [address] [place]) [place] [address] Phone: [phone] Fax: [phone]     Social Drivers  of Health (SDOH) Social History: SDOH Screenings   Food Insecurity: No Food Insecurity (09/24/2024)  Housing: Low Risk  (09/24/2024)  Transportation Needs: No Transportation Needs (09/24/2024)  Utilities: Not At Risk (09/24/2024)  Depression (PHQ2-9): Low Risk  (07/20/2021)  Financial Resource Strain: Medium Risk (09/17/2020)  Social Connections: Socially Isolated (09/24/2024)  Stress: No Stress Concern Present (01/24/2020)  Tobacco Use: High Risk (09/24/2024)   SDOH Interventions:     Readmission Risk Interventions     No data to display

## 2024-09-25 NOTE — Progress Notes (Signed)
 PROGRESS NOTE Zachary Santos    DOB: 09-Aug-1936, 88 y.o.  FMW:[id number]    Code Status: Limited: Do not attempt resuscitation (DNR) -DNR-LIMITED -Do Not Intubate/DNI    DOA: 09/24/2024   LOS: 0  Brief hospital course  Zachary Santos is a 88 y.o. male with a PMH significant for COPD, diabetes, CKD, anxiety, BPH, chronic indwelling Foley catheter HTN, DM, HLD, PPM and recent diagnosis of right adrenal mass who came into ED complaining of nausea vomiting and dizziness for the last 3 days.  Patient was here at the emergency room 3 days ago where he was found to have urinary tract infection and was discharged home on oral antibiotics.  He did not fill his medication until yesterday.  Patient stated that since he went home he was not feeling well with continuous nausea, vomiting and dizziness. Patient denies any fever, chills, chest pain, shortness of breath, palpitations, leg swelling.   ED Course: 161/106, urine analysis was positive for UTI, COVID, flu, RSV was negative.  He had a leukocytosis at 17.2, lactic acid of 2.8.  Chest x-ray did not find any acute finding.  CT scan of the brain showed left cerebellar mass.  Patient was started on ceftriaxone , sent the blood and urine cultures.  09/25/24 -UxCx pending. Will continue on IV Abx. Patient currently lives alone and may need increased support. PT/OT evals ordered to help determine dispo recs. Will continue with hospice and declines further invasive treatments.   Assessment & Plan  Principal Problem:   UTI (urinary tract infection) Active Problems:   Essential hypertension, benign   COPD (chronic obstructive pulmonary disease) (HCC)   BPH (benign prostatic hyperplasia)   Anxiety and depression   Atrial fibrillation, chronic (HCC)   Right adrenal mass   Cerebellar mass   Dizziness  Sepsis due to urinary tract infection- lactic acidosis and leukocytosis. Have resolved.  - continue ceftriaxone  - follow up urine and blood cultures   Cerebellar  mass- as seen on head CT. Possibly contributing to dizziness/generalized weakness. Neurosurgery evaluated and patient not seeking definitive dx or treatment at this time.  - Patient and family did not want further intervention. - Will continue meclizine  and supportive care - PT/OT   HTN/A-fib - Resume home medications - Patient already has pacemaker   BPH - Patient has a chronic Foley catheter - Resume his home medications Proscar  and Flomax    COPD without any exacerbation - Give oxygen  as needed to maintain saturation more than 90%  Body mass index is 19.53 kg/m.  VTE ppx: apixaban  (ELIQUIS ) tablet 2.5 mg Start: 09/24/24 2200 SCDs Start: 09/24/24 1201 apixaban  (ELIQUIS ) tablet 2.5 mg   Diet:     Diet   Diet regular Room service appropriate? Yes; Fluid consistency: Thin   Consultants: Neurosurgery Hospice   Subjective 09/25/24    Pt reports feeling well. Denies dysuria. Overall has increased weakness from baseline. Felt dizzy upon sitting up today but not currently dizzy.    Objective  Blood pressure (!) 167/70, pulse (!) 59, temperature 98.1 F (36.7 C), resp. rate 16, height 6' (1.829 m), weight 65.3 kg, SpO2 100%.  Intake/Output Summary (Last 24 hours) at 09/25/2024 0723 Last data filed at 09/24/2024 1039 Gross per 24 hour  Intake 100 ml  Output --  Net 100 ml   Filed Weights   09/24/24 0819  Weight: 65.3 kg     Physical Exam:  General: awake, alert, NAD HEENT: atraumatic, clear conjunctiva, anicteric sclera, MMM, hearing grossly normal  Respiratory: normal respiratory effort. Cardiovascular: extremities well perfused, quick capillary refill, normal S1/S2, RRR, no JVD, murmurs Gastrointestinal: soft, NT, ND Nervous: A&O x3. no gross focal neurologic deficits, normal speech Extremities: moves all equally, no edema, normal tone Skin: dry, intact, normal temperature, normal color. No rashes, lesions or ulcers on exposed skin Psychiatry: normal mood, congruent  affect  Labs   I have personally reviewed the following labs and imaging studies CBC    Component Value Date/Time   WBC 17.2 (H) 09/24/2024 0829   RBC 4.24 09/24/2024 0829   HGB 13.3 09/24/2024 0829   HGB 13.2 08/06/2014 0443   HCT 40.2 09/24/2024 0829   HCT 38.9 (L) 08/06/2014 0443   PLT 221 09/24/2024 0829   PLT 160 08/06/2014 0443   MCV 94.8 09/24/2024 0829   MCV 91 08/06/2014 0443   MCH 31.4 09/24/2024 0829   MCHC 33.1 09/24/2024 0829   RDW 13.2 09/24/2024 0829   RDW 13.6 08/06/2014 0443   LYMPHSABS 0.7 09/24/2024 0829   LYMPHSABS 1.8 08/06/2014 0443   MONOABS 0.8 09/24/2024 0829   MONOABS 0.7 08/06/2014 0443   EOSABS 0.0 09/24/2024 0829   EOSABS 0.2 08/06/2014 0443   BASOSABS 0.0 09/24/2024 0829   BASOSABS 0.0 08/06/2014 0443      Latest Ref Rng & Units 09/24/2024    8:29 AM 09/21/2024    7:20 AM 09/25/2023    2:08 PM  BMP  Glucose 70 - 99 mg/dL 859  776  796   BUN 8 - 23 mg/dL 31  34  12   Creatinine 0.61 - 1.24 mg/dL 8.15  8.18  8.67   Sodium 135 - 145 mmol/L 142  141  139   Potassium 3.5 - 5.1 mmol/L 4.9  4.8  4.0   Chloride 98 - 111 mmol/L 99  102  103   CO2 22 - 32 mmol/L 22  26  27    Calcium  8.9 - 10.3 mg/dL 9.8  9.6  9.1     CT HEAD WO CONTRAST ( ) Addendum Date: 09/24/2024 ** ADDENDUM #1 * ADDENDUM: Findings were discussed with the ordering physician, Dr. Ernest, at 9:15 am 09/24/24. ---------------------------------------------------- Electronically signed by: Evalene Coho MD 09/24/2024 09:16 AM EST RP Workstation: [id number]   Result Date: 09/24/2024  ORIGINAL REPORT ** EXAM: CT HEAD WITHOUT CONTRAST 09/24/2024 08:57:21 AM TECHNIQUE: CT of the head was performed without the administration of intravenous contrast. Automated exposure control, iterative reconstruction, and/or weight based adjustment of the mA/kV was utilized to reduce the radiation dose to as low as reasonably achievable. COMPARISON: CT of the head dated 10/06/2020. CLINICAL HISTORY:  Headache, neuro deficit. FINDINGS: BRAIN AND VENTRICLES: There is an ovoid circumscribed mass noted present centrally within the left cerebellar hemisphere, measuring approximately 2.6 x 2.5 x 1.9 cm. It appears increasing density along its periphery, particularly medially, which could be from hemorrhage or calcification. There is age-related atrophy and moderate periventricular and deep cerebral white matter disease present. The patient is again noted to be status post left suboccipital craniotomy. No evidence of acute infarct. No hydrocephalus. No extra-axial collection. ORBITS: No acute abnormality. The patient is also status post bilateral lens replacement. SINUSES: No acute abnormality. SOFT TISSUES AND SKULL: No acute soft tissue abnormality. No skull fracture. Status post left suboccipital craniotomy. IMPRESSION: 1. Ovoid circumscribed mass centrally within the left cerebellar hemisphere, measuring approximately 2.6 x 2.5 x 1.9 cm, with increasing density along its periphery, possibly due to hemorrhage or calcification. Correlation with an MRI of  the brain without and with gadolinium contrast is suggested. Neurosurgery consultation is also recommended. Correlation with a CT of the chest, abdomen and pelvis also might be considered to assess for possible primary neoplasm. 2. Age-related atrophy and moderate periventricular and deep cerebral white matter disease. 3. Status post left suboccipital craniotomy and bilateral lens replacement. Electronically signed by: Evalene Coho MD 09/24/2024 09:07 AM EST RP Workstation: [id number]   DG Chest Portable 1 View Result Date: 09/24/2024 EXAM: 1 VIEW(S) XRAY OF THE CHEST 09/24/2024 08:51:56 AM COMPARISON: 06/20/2023 CLINICAL HISTORY: SOB FINDINGS: LUNGS AND PLEURA: No focal pulmonary opacity. No pleural effusion. No pneumothorax. HEART AND MEDIASTINUM: Left chest dual lead implantable cardiac device. Aortic atherosclerosis. BONES AND SOFT TISSUES: Degenerative  changes of spine. ACDF hardware in partially imaged lower cervical spine. IMPRESSION: 1. No acute findings. Electronically signed by: Evalene Coho MD 09/24/2024 09:08 AM EST RP Workstation: [id number]    Disposition Plan & Communication  Patient status: Observation  Admitted From: Home Planned disposition location: TBD Anticipated discharge date: TBD pending PT/OT eval   Family Communication: none at bedside    Author: Marien LITTIE Piety, DO [facility] 09/25/2024, 7:23 AM   Available by Epic secure chat 7AM-7PM. If 7PM-7AM, please contact night-coverage.  [facility] contact information found on [email or web address].

## 2024-09-25 NOTE — Plan of Care (Signed)

## 2024-09-25 NOTE — Plan of Care (Signed)
   Problem: Coping: Goal: Ability to adjust to condition or change in health will improve Outcome: Progressing   Problem: Fluid Volume: Goal: Ability to maintain a balanced intake and output will improve Outcome: Progressing   Problem: Health Behavior/Discharge Planning: Goal: Ability to manage health-related needs will improve Outcome: Progressing   Problem: Nutritional: Goal: Maintenance of adequate nutrition will improve Outcome: Progressing

## 2024-09-25 NOTE — Progress Notes (Signed)
 [facility] hospital liaison note:   Mr. 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 Mr. Zachary Santos has an indwelling catheter and just started on oral antibiotics for UTI yesterday however he is still having uncontrolled dizziness and nausea/vomiting. [facility] was notified after hospitalization. He is currently being admitted for UTI and antibiotics. Per Dr. Lorenso with [facility] this is a related hospital admission. Patient is a DNR.   Patient sitting up on bed, alert and oriented.  Patient conversing with HL and daughter during visit. Patient reported that his son passed on Oct 26, 2024.  HL offered support and allowed patient time to share.   Daughter asked HL about completing Health Care POA. Advised her the hospital will assist with this document.    Mr. Zachary Santos is inpatient appropriate with need for diagnostic testing and IV antibiotics.   Vital Signs- 97.8/60/16    178/86  spO2 99% at 2L/min   I&O- 100/not recorded  Abnormal Labs- glucose 156, BUN 30, Creatinine 1.61, GFR 41, WBC 17,8, RBC 3.68, Hemoglobin 11.4, HCT 33.9, PT 16.7, INR 1.3  Diagnostics- none new  IV/PRN Meds- Rocephin  2 gram q24H  Current plan as per Dr. Marien Piety 12.9.2025  Principal Problem:   UTI (urinary tract infection) Active Problems:   Essential hypertension, benign   COPD (chronic obstructive pulmonary disease) (HCC)   BPH (benign prostatic hyperplasia)   Anxiety and depression   Atrial fibrillation, chronic (HCC)   Right adrenal mass   Cerebellar mass   Dizziness   Sepsis due to urinary tract infection- lactic acidosis and leukocytosis. Have resolved.  - continue ceftriaxone  - follow up urine and blood cultures   Cerebellar mass- as seen on head CT. Possibly contributing to dizziness/generalized weakness. Neurosurgery evaluated and patient not seeking definitive dx or treatment at this time.  -  Patient and family did not want further intervention. - Will continue meclizine  and supportive care - PT/OT   HTN/A-fib - Resume home medications - Patient already has pacemaker   BPH - Patient has a chronic Foley catheter - Resume his home medications Proscar  and Flomax    COPD without any exacerbation - Give oxygen  as needed to maintain saturation more than 90%  Discharge Planning- Ongoing, likely home, but may need SNF or LTC.   Family Contact- met with patient at bedside, along with his daughter.   IDT: Updated Goals of Care: DNR  Transfer summary and Med list sent to be placed under Media tab. Thank you for the opportunity to participate in this patient's care, please don't hesitate to call for any hospice related questions or concerns.   [facility] Liaison [phone]

## 2024-09-26 ENCOUNTER — Other Ambulatory Visit: Payer: Self-pay

## 2024-09-26 DIAGNOSIS — J432 Centrilobular emphysema: Secondary | ICD-10-CM

## 2024-09-26 DIAGNOSIS — I482 Chronic atrial fibrillation, unspecified: Secondary | ICD-10-CM

## 2024-09-26 LAB — GLUCOSE, CAPILLARY
Glucose-Capillary: 117 mg/dL — ABNORMAL HIGH (ref 70–99)
Glucose-Capillary: 228 mg/dL — ABNORMAL HIGH (ref 70–99)
Glucose-Capillary: 163 mg/dL — ABNORMAL HIGH (ref 70–99)
Glucose-Capillary: 199 mg/dL — ABNORMAL HIGH (ref 70–99)

## 2024-09-26 MED ORDER — ENSURE PLUS HIGH PROTEIN PO LIQD
237.0000 mL | Freq: Two times a day (BID) | ORAL | Status: DC
  Administered 2024-09-26 – 2024-09-27 (×2): 237 mL via ORAL

## 2024-09-26 MED ORDER — CEPHALEXIN 500 MG PO CAPS
500.0000 mg | ORAL_CAPSULE | Freq: Three times a day (TID) | ORAL | Status: DC
  Administered 2024-09-26 – 2024-09-27 (×4): 500 mg via ORAL
  Filled 2024-09-26 (×4): qty 1

## 2024-09-26 NOTE — Evaluation (Signed)
 Physical Therapy Evaluation Patient Details Name: Zachary Santos MRN: [id number] DOB: [birth year] Today's Date: 09/26/2024  History of Present Illness  Zachary Santos is a pleasant 88 y.o. male with medical history significant for COPD, diabetes, CKD, anxiety, BPH, chronic indwelling Foley catheter HTN, DM, HLD, PPM and recent diagnosis of right adrenal mass who came into ED complaining of nausea vomiting and dizziness for the last 3 days.  CT scan of the brain showed left cerebellar mass.   Clinical Impression  Pt admitted with above diagnosis. Pt currently with functional limitations due to the deficits listed below (see PT Problem List). Pt received upright in bed agreeable to PT/OT co-eval due to low likelihood of pt tolerating separate sessions. Pt reports PTA being mod-I at household level with gait and daughter and hospice nurse helps with ADL's/IADL's. Hospice nurse comes to home MWF.   To date, pt with poor insight to safety/awareness and is impulsive. Reliant on minA for bed mobility, STS, gait with RW. Pt with R lateral lean with gait and worsening lean and RLE buckling as pt fatigues completing ~50'. Pt relies on VCs and education for safe RW use and body positioning with limited carryover. Pt maintains > 90% on 2 L/min via [place] during bout but does demo increased WOB upon sitting. D/c recs heavily dependent on pt's GOC. Pt would benefit from 24/7 assist or supervision due to poor safety awareness, weakness, and impulsivity but does voice goals of improving strength/mobility thus acute PT will continue to follow per POC. Pt with all needs in reach in recliner.       If plan is discharge home, recommend the following: A lot of help with walking and/or transfers;A little help with bathing/dressing/bathroom;Assistance with cooking/housework;Help with stairs or ramp for entrance;Assist for transportation   Can travel by private vehicle        Equipment Recommendations None recommended by PT   Recommendations for Other Services       Functional Status Assessment Patient has had a recent decline in their functional status and demonstrates the ability to make significant improvements in function in a reasonable and predictable amount of time.     Precautions / Restrictions Precautions Precautions: Fall Recall of Precautions/Restrictions: Impaired Restrictions Weight Bearing Restrictions Per Provider Order: No      Mobility  Bed Mobility Overal bed mobility: Needs Assistance Bed Mobility: Supine to Sit     Supine to sit: Min assist, Used rails, HOB elevated     General bed mobility comments: min to mod multi modal cues Patient Response: Cooperative  Transfers Overall transfer level: Needs assistance Equipment used: Rolling walker (2 wheels) Transfers: Sit to/from Stand Sit to Stand: Min assist           General transfer comment: cues for hand placement    Ambulation/Gait Ambulation/Gait assistance: Contact guard assist, Min assist Gait Distance (Feet): 50 Feet Assistive device: Rolling walker (2 wheels) Gait Pattern/deviations: Step-to pattern, Drifts right/left, Trunk flexed, Narrow base of support, Knees buckling, Knee flexed in stance - right       General Gait Details: R lateral lean worsens with pt fatigue. MinA on RW needed for correct/safe use and on pelvis towards end of bout due to fatigue.  Stairs            Wheelchair Mobility     Tilt Bed Tilt Bed Patient Response: Cooperative  Modified Rankin (Stroke Patients Only)       Balance Overall balance assessment: Needs assistance Sitting-balance support:  No upper extremity supported, Feet supported Sitting balance-Leahy Scale: Good     Standing balance support: Reliant on assistive device for balance, During functional activity Standing balance-Leahy Scale: Fair                               Pertinent Vitals/Pain Pain Assessment Pain Assessment: No/denies pain     Home Living Family/patient expects to be discharged to:: Private residence Living Arrangements: Alone Available Help at Discharge: Other (Comment);Available PRN/intermittently (hospice nurse MWF. Daughter with IADL's) Type of Home: House Home Access: Ramped entrance       Home Layout: One level Home Equipment: Shower seat      Prior Function Prior Level of Function : Independent/Modified Independent             Mobility Comments: intermittent use of walker/cane depending on the day ADLs Comments: managing basic ADLs without A but reports difficulty, family assists with IADLs     Extremity/Trunk Assessment   Upper Extremity Assessment Upper Extremity Assessment: Defer to OT evaluation    Lower Extremity Assessment Lower Extremity Assessment: Generalized weakness    Cervical / Trunk Assessment Cervical / Trunk Assessment: Kyphotic  Communication   Communication Communication: No apparent difficulties    Cognition Arousal: Alert Behavior During Therapy: Impulsive   PT - Cognitive impairments: No apparent impairments                       PT - Cognition Comments: follows commands. Poor insight into current deficits/safety. Following commands: Impaired Following commands impaired: Follows multi-step commands inconsistently     Cueing Cueing Techniques: Verbal cues, Tactile cues     General Comments General comments (skin integrity, edema, etc.): SPO2 maintains > 90% on 2 L/min with gait    Exercises Other Exercises Other Exercises: Role of PT in acute setting, DME for reduced falls risk, benefits of OOB mobility pending pt's goals of care   Assessment/Plan    PT Assessment Patient needs continued PT services  PT Problem List Decreased strength;Decreased activity tolerance;Decreased balance;Decreased mobility;Decreased safety awareness;Decreased knowledge of use of DME       PT Treatment Interventions DME instruction;Balance training;Gait  training;Neuromuscular re-education;Stair training;Functional mobility training;Patient/family education;Therapeutic activities;Therapeutic exercise    PT Goals (Current goals can be found in the Care Plan section)  Acute Rehab PT Goals Patient Stated Goal: improve strength PT Goal Formulation: With patient Time For Goal Achievement: 10/10/24 Potential to Achieve Goals: Fair    Frequency Min 2X/week     Co-evaluation PT/OT/SLP Co-Evaluation/Treatment: Yes Reason for Co-Treatment: For patient/therapist safety;To address functional/ADL transfers PT goals addressed during session: Mobility/safety with mobility;Proper use of DME OT goals addressed during session: ADL's and self-care       AM-PAC PT 6 Clicks Mobility  Outcome Measure Help needed turning from your back to your side while in a flat bed without using bedrails?: A Little Help needed moving from lying on your back to sitting on the side of a flat bed without using bedrails?: A Lot Help needed moving to and from a bed to a chair (including a wheelchair)?: A Little Help needed standing up from a chair using your arms (e.g., wheelchair or bedside chair)?: A Little Help needed to walk in hospital room?: A Lot Help needed climbing 3-5 steps with a railing? : A Lot 6 Click Score: 15    End of Session Equipment Utilized During Treatment: Gait belt Activity  Tolerance: Patient tolerated treatment well;Patient limited by fatigue Patient left: in chair;with call bell/phone within reach;with chair alarm set;with family/visitor present Nurse Communication: Mobility status PT Visit Diagnosis: Other abnormalities of gait and mobility (R26.89);Muscle weakness (generalized) (M62.81);Unsteadiness on feet (R26.81)    Time: 9152-9089 PT Time Calculation (min) (ACUTE ONLY): 23 min   Charges:   PT Evaluation $PT Eval Moderate Complexity: 1 Mod PT Treatments $Therapeutic Activity: 8-22 mins PT General Charges $$ ACUTE PT VISIT: 1  Visit         Dorina HERO. Fairly IV, PT, DPT Physical Therapist- [place]  [facility] 09/26/2024, 11:15 AM

## 2024-09-26 NOTE — Progress Notes (Signed)
°  Progress Note   Patient: Zachary Santos FMW:[id number] DOB: Apr 21, 1936 DOA: 09/24/2024     1 DOS: the patient was seen and examined on 09/26/2024   Brief hospital course: Zachary Santos is a 88 y.o. male with a PMH significant for COPD, diabetes, CKD, anxiety, BPH, chronic indwelling Foley catheter HTN, DM, HLD, PPM and recent diagnosis of right adrenal mass who came into ED complaining of nausea vomiting and dizziness for the last 3 days.  Patient was here at the emergency room 3 days ago where he was found to have urinary tract infection and was discharged home on oral antibiotics.  He did not fill his medication until yesterday.  Patient stated that since he went home he was not feeling well with continuous nausea, vomiting and dizziness.  She is placed on IV antibiotics for possible UTI. Urine culture came multiple species   Principal Problem:   UTI (urinary tract infection) Active Problems:   Essential hypertension, benign   COPD (chronic obstructive pulmonary disease) (HCC)   BPH (benign prostatic hyperplasia)   Anxiety and depression   Atrial fibrillation, chronic (HCC)   Right adrenal mass   Cerebellar mass   Dizziness   Assessment and Plan: Sepsis due to urinary tract infection- lactic acidosis and leukocytosis. Have resolved.  Treated with 2 days IV Rocephin , urine culture showed multiple species, will change to oral antibiotics with Keflex  for additional 3 days.   Cerebellar mass- as seen on head CT. Possibly contributing to dizziness/generalized weakness. Neurosurgery evaluated and patient not seeking definitive dx or treatment at this time.  - Patient and family did not want further intervention. - Will continue meclizine  and supportive care Seen by PT/OT, recommending nursing home placement.   HTN Paroxysmal atrial fibrillation. - Resume home medications - Patient already has pacemaker, currently in sinus rhythm.   BPH - Patient has a chronic Foley catheter - Resume  his home medications Proscar  and Flomax    COPD without any exacerbation Continue to follow.         Subjective:  Patient doing well today, no short of breath or cough.  Physical Exam: Vitals:   09/25/24 1636 09/25/24 2118 09/26/24 0420 09/26/24 0758  BP: (!) 163/76 (!) 124/51 (!) 164/74 (!) 159/66  Pulse: (!) 58 (!) 59 (!) 59 60  Resp: 20 17 18 16   Temp: 98.6 F (37 C) 97.6 F (36.4 C) 97.8 F (36.6 C) 97.8 F (36.6 C)  TempSrc: Oral Oral Oral Oral  SpO2: 94% 100% 100% 97%  Weight:      Height:       General exam: Appears calm and comfortable  Respiratory system: Clear to auscultation. Respiratory effort normal. Cardiovascular system: S1 & S2 heard, RRR. No JVD, murmurs, rubs, gallops or clicks. No pedal edema. Gastrointestinal system: Abdomen is nondistended, soft and nontender. No organomegaly or masses felt. Normal bowel sounds heard. Central nervous system: Alert and oriented x2. No focal neurological deficits. Extremities: Symmetric 5 x 5 power. Skin: No rashes, lesions or ulcers Psychiatry: Judgement and insight appear normal. Mood & affect appropriate.    Data Reviewed:  Lab results reviewed.  Family Communication: Daughter updated at bedside.  Disposition: Status is: Inpatient Remains inpatient appropriate because: Unsafe discharge, pending placement with hospice.     Time spent: 35 minutes  Author: Murvin Mana, MD 09/26/2024 4:04 PM  For on call review [email or web address].

## 2024-09-26 NOTE — Progress Notes (Signed)
 [facility] [address] [facility] Liaison Note  Patient revoked his Hospice Medicare Benefit today to pursue snf rehab.  Hospital team awere.  Palliative care referral submitted for follow up at [facility] rehab.  Please call with any hospice or palliative care questions.  [facility] Liaison [phone]

## 2024-09-26 NOTE — Hospital Course (Addendum)
 Zachary Santos is a 88 y.o. male with a PMH significant for COPD, diabetes, CKD, anxiety, BPH, chronic indwelling Foley catheter HTN, DM, HLD, PPM and recent diagnosis of right adrenal mass who came into ED complaining of nausea vomiting and dizziness for the last 3 days.  Patient was here at the emergency room 3 days ago where he was found to have urinary tract infection and was discharged home on oral antibiotics.  He did not fill his medication until yesterday.  Patient stated that since he went home he was not feeling well with continuous nausea, vomiting and dizziness.  She is placed on IV antibiotics for possible UTI. Urine culture came multiple species

## 2024-09-26 NOTE — Plan of Care (Signed)
  Problem: Education: Goal: Ability to describe self-care measures that may prevent or decrease complications (Diabetes Survival Skills Education) will improve Outcome: Progressing Goal: Individualized Educational Video(s) Outcome: Progressing   Problem: Coping: Goal: Ability to adjust to condition or change in health will improve Outcome: Progressing   Problem: Fluid Volume: Goal: Ability to maintain a balanced intake and output will improve Outcome: Progressing   Problem: Health Behavior/Discharge Planning: Goal: Ability to identify and utilize available resources and services will improve Outcome: Progressing Goal: Ability to manage health-related needs will improve Outcome: Progressing   Problem: Metabolic: Goal: Ability to maintain appropriate glucose levels will improve Outcome: Progressing   Problem: Nutritional: Goal: Maintenance of adequate nutrition will improve Outcome: Progressing Goal: Progress toward achieving an optimal weight will improve Outcome: Progressing   Problem: Skin Integrity: Goal: Risk for impaired skin integrity will decrease Outcome: Progressing   Problem: Tissue Perfusion: Goal: Adequacy of tissue perfusion will improve Outcome: Progressing   Problem: Education: Goal: Knowledge of General Education information will improve Description: Including pain rating scale, medication(s)/side effects and non-pharmacologic comfort measures Outcome: Progressing   Problem: Health Behavior/Discharge Planning: Goal: Ability to manage health-related needs will improve Outcome: Progressing   Problem: Clinical Measurements: Goal: Ability to maintain clinical measurements within normal limits will improve Outcome: Progressing Goal: Will remain free from infection Outcome: Progressing Goal: Diagnostic test results will improve Outcome: Progressing Goal: Respiratory complications will improve Outcome: Progressing Goal: Cardiovascular complication will  be avoided Outcome: Progressing   Problem: Activity: Goal: Risk for activity intolerance will decrease Outcome: Progressing   Problem: Nutrition: Goal: Adequate nutrition will be maintained Outcome: Progressing   Problem: Coping: Goal: Level of anxiety will decrease Outcome: Progressing   Problem: Pain Managment: Goal: General experience of comfort will improve and/or be controlled Outcome: Progressing   Problem: Safety: Goal: Ability to remain free from injury will improve Outcome: Progressing

## 2024-09-26 NOTE — Evaluation (Signed)
 Occupational Therapy Evaluation Patient Details Name: Zachary Santos MRN: [id number] DOB: 21-Jun-1936 Today's Date: 09/26/2024   History of Present Illness   Zachary Santos is a pleasant 88 y.o. male with medical history significant for COPD, diabetes, CKD, anxiety, BPH, chronic indwelling Foley catheter HTN, DM, HLD, PPM and recent diagnosis of right adrenal mass who came into ED complaining of nausea vomiting and dizziness for the last 3 days.  CT scan of the brain showed left cerebellar mass.     Clinical Impressions Patient was seen for OT evaluation this date. Prior to hospital admission, patient was living at home alone, some history obtained from daughter who reports he has been on hospice for the last 2 years for heart stuff but recently adrenal mass noted and now mass on brain. She reports we were told he is end of life, however they don't have a good understanding for discharge disposition whether its home with hospice or hospice facility. From a function standpoint, patient performed bed mobility, transfers and gait with min A while on 2L; patient had 1 instance of knee buckling which required A to maintain balance. Patient presents with impulsivity and poor safety awareness at times but was able to return demo instructions to improve safety with cues. Patient lives alone with intermittent support. discharge disposition will likely be determined by palliative care consult and MD recommendations however patient will require 24/7 supervision and assist at time of discharge.  Patient presents with deficits in standing tolerance, safety awareness and overall strength, affecting safe and optimal ADL completion. Patient is currently requiring mod-max A for LB self care tasks, min A for transfers.  Paient would benefit from skilled OT services to address noted impairments and functional limitations (see below for any additional details) in order to maximize safety and independence while minimizing  future risk of falls, injury, and readmission.      If plan is discharge home, recommend the following:   A little help with walking and/or transfers;A little help with bathing/dressing/bathroom     Functional Status Assessment   Patient has had a recent decline in their functional status and demonstrates the ability to make significant improvements in function in a reasonable and predictable amount of time.     Equipment Recommendations   BSC/3in1     Recommendations for Other Services         Precautions/Restrictions   Precautions Precautions: Fall Recall of Precautions/Restrictions: Impaired Restrictions Weight Bearing Restrictions Per Provider Order: No     Mobility Bed Mobility Overal bed mobility: Needs Assistance Bed Mobility: Supine to Sit     Supine to sit: Min assist          Transfers Overall transfer level: Needs assistance Equipment used: Rolling walker (2 wheels) Transfers: Sit to/from Stand Sit to Stand: Min assist           General transfer comment: cues for hand placement      Balance Overall balance assessment: Needs assistance Sitting-balance support: No upper extremity supported, Feet supported Sitting balance-Leahy Scale: Good     Standing balance support: Reliant on assistive device for balance, During functional activity Standing balance-Leahy Scale: Fair                             ADL either performed or assessed with clinical judgement   ADL Overall ADL's : Needs assistance/impaired Eating/Feeding: Set up   Grooming: Contact guard assist;Standing   Upper Body Bathing:  Sitting;Set up;Contact guard assist   Lower Body Bathing: Moderate assistance;Sit to/from stand   Upper Body Dressing : Set up;Sitting   Lower Body Dressing: Moderate assistance;Maximal assistance;Sit to/from stand   Toilet Transfer: Minimal assistance;Rolling walker (2 wheels)   Toileting- Clothing Manipulation and Hygiene:  Minimal assistance;Sit to/from stand   Tub/ Engineer, Structural: Minimal assistance   Functional mobility during ADLs: Minimal assistance;Rolling walker (2 wheels) General ADL Comments: cues for pacing (impulsive) and cues for body mechanics regarding hand placement and walker management     Vision         Perception         Praxis         Pertinent Vitals/Pain Pain Assessment Pain Assessment: No/denies pain     Extremity/Trunk Assessment Upper Extremity Assessment Upper Extremity Assessment: Generalized weakness   Lower Extremity Assessment Lower Extremity Assessment: Defer to PT evaluation       Communication Communication Communication: No apparent difficulties   Cognition Arousal: Alert Behavior During Therapy: Impulsive Cognition: History of cognitive impairments, Cognition impaired     Awareness: Online awareness impaired, Intellectual awareness impaired 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, Clinical functioning impairment (select all impairments): Sequencing, Reasoning                   Following commands: Impaired Following commands impaired: Follows multi-step commands inconsistently     Cueing  General Comments   Cueing Techniques: Verbal cues;Tactile cues      Exercises     Shoulder Instructions      Home Living Family/patient expects to be discharged to:: Private residence Living Arrangements: Alone Available Help at Discharge: Other (Comment) (hospice nurse 3x a week, intermittent family support) Type of Home: House Home Access: Ramped entrance     Home Layout: One level     Bathroom Shower/Tub: Producer, Television/film/video: Standard Bathroom Accessibility: Yes How Accessible: Accessible via walker Home Equipment: Shower seat          Prior Functioning/Environment Prior Level of Function : Independent/Modified Independent             Mobility Comments: intermittent use of walker/cane  depending on the day ADLs Comments: managing basic ADLs without A but reports difficulty, family assists with IADLs    OT Problem List: Decreased strength;Decreased activity tolerance;Impaired balance (sitting and/or standing);Decreased safety awareness   OT Treatment/Interventions: Self-care/ADL training;Therapeutic exercise;Energy conservation;DME and/or AE instruction;Therapeutic activities;Patient/family education;Balance training      OT Goals(Current goals can be found in the care plan section)   Acute Rehab OT Goals Patient Stated Goal: to get stronger OT Goal Formulation: With patient Time For Goal Achievement: 10/10/24 Potential to Achieve Goals: Fair ADL Goals Pt Will Perform Grooming: with supervision;standing Pt Will Perform Lower Body Dressing: with supervision;sit to/from stand Pt Will Transfer to Toilet: with supervision;ambulating   OT Frequency:  Min 2X/week    Co-evaluation PT/OT/SLP Co-Evaluation/Treatment: Yes Reason for Co-Treatment: For patient/therapist safety;To address functional/ADL transfers PT goals addressed during session: Mobility/safety with mobility;Proper use of DME OT goals addressed during session: ADL's and self-care      AM-PAC OT 6 Clicks Daily Activity     Outcome Measure Help from another person eating meals?: None Help from another person taking care of personal grooming?: A Little Help from another person toileting, which includes using toliet, bedpan, or urinal?: A Little Help from another person bathing (including washing, rinsing, drying)?: A Little Help from another person to put on and  taking off regular upper body clothing?: A Little Help from another person to put on and taking off regular lower body clothing?: A Lot 6 Click Score: 18   End of Session Equipment Utilized During Treatment: Gait belt;Rolling walker (2 wheels);Oxygen  Nurse Communication: Mobility status  Activity Tolerance: Patient limited by  fatigue Patient left: in chair;with call bell/phone within reach;with chair alarm set;with nursing/sitter in room;with family/visitor present  OT Visit Diagnosis: Unsteadiness on feet (R26.81);Repeated falls (R29.6);Muscle weakness (generalized) (M62.81);History of falling (Z91.81)                Time: 9152-9087 OT Time Calculation (min): 25 min Charges:  OT General Charges $OT Visit: 1 Visit OT Evaluation $OT Eval Low Complexity: 1 Low  Rogers Clause, OT/L MSOT, 09/26/2024

## 2024-09-26 NOTE — NC FL2 (Signed)
 [place]  MEDICAID FL2 LEVEL OF CARE FORM     IDENTIFICATION  Patient Name: Zachary Santos DOBEK Birthdate: 07-21-1936 Sex: male Admission Date (Current Location): 09/24/2024  [facility] and [place] Number:  Chiropodist and Address:  [facility], [address] [place], [place] [address]      Provider Number: [id number]  Attending Physician Name and Address:  Laurita Pillion, MD  Relative Name and Phone Number:       Current Level of Care: Hospital Recommended Level of Care: Skilled Nursing Facility Prior Approval Number:    Date Approved/Denied:   PASRR Number: Manual review  Discharge Plan: SNF    Current Diagnoses: Patient Active Problem List   Diagnosis Date Noted   UTI (urinary tract infection) 09/24/2024   Right adrenal mass 09/24/2024   Cerebellar mass 09/24/2024   Dizziness 09/24/2024   COPD exacerbation (HCC) 09/14/2022   Hypokalemia 09/14/2022   Elevated lactic acid level 09/14/2022   Glaucoma 04/17/2021   Restless leg 04/17/2021   Rhinorrhea 04/17/2021   PND (paroxysmal nocturnal dyspnea) 04/17/2021   Mixed diabetic hyperlipidemia associated with type 2 diabetes mellitus (HCC) 10/31/2020   Nicotine  dependence, cigarettes, uncomplicated 10/31/2020   Rash 10/20/2020   Bilateral leg weakness 10/20/2020   Cardiac syncope 10/07/2020   Complete heart block (HCC)    Musculoskeletal chest pain 08/18/2020   Atherosclerosis 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 (HCC) 06/06/2020   Atrial fibrillation, chronic (HCC) 05/27/2020   Blue toes 05/27/2020   Insomnia 11/23/2019   Anxiety and depression 07/17/2019   Erectile dysfunction 08/29/2018   Elevated alkaline phosphatase level 08/02/2017   Fatigue 05/02/2017   Weight loss 05/02/2017   Dyslipidemia 11/01/2016   Right leg claudication 06/20/2015   Chronic kidney disease, stage 3a (HCC)    Type 2 diabetes mellitus with hyperglycemia, without long-term current use of insulin   (HCC) 09/12/2013   Osteoarthritis of right knee 06/07/2013   BPH (benign prostatic hyperplasia) 02/02/2010   Essential hypertension, benign 02/05/2008   COPD (chronic obstructive pulmonary disease) (HCC) 02/05/2008   Paget's disease of bone 02/05/2008    Orientation RESPIRATION BLADDER Height & Weight     Time, Situation, Place, Self  O2 (Nasal Cannula 2 L) Continent, Indwelling catheter Weight: 144 lb (65.3 kg) Height:  6' (182.9 cm)  BEHAVIORAL SYMPTOMS/MOOD NEUROLOGICAL BOWEL NUTRITION STATUS   (None)  (None) Continent Diet (Regular)  AMBULATORY STATUS COMMUNICATION OF NEEDS Skin   Limited Assist Verbally Bruising                       Personal Care Assistance Level of Assistance  Bathing, Feeding, Dressing Bathing Assistance: Limited assistance Feeding assistance: Limited assistance Dressing Assistance: Limited assistance     Functional Limitations Info  Sight, Hearing, Speech Sight Info: Adequate Hearing Info: Adequate Speech Info: Adequate    SPECIAL CARE FACTORS FREQUENCY  PT (By licensed PT), OT (By licensed OT)     PT Frequency: 5 x week OT Frequency: 5 x week            Contractures Contractures Info: Not present    Additional Factors Info  Code Status, Allergies Code Status Info: DNR Allergies Info: Other: After getting pacemaker, the medication applied to skin caused a bad rash.           Current Medications (09/26/2024):  This is the current hospital active medication list Current Facility-Administered Medications  Medication Dose Route Frequency Provider Last Rate Last Admin  acetaminophen  (TYLENOL ) tablet 650 mg  650 mg Oral Q6H PRN Paudel, Keshab, MD       Or   acetaminophen  (TYLENOL ) suppository 650 mg  650 mg Rectal Q6H PRN Paudel, Nena, MD       albuterol  (PROVENTIL ) (2.5 MG/3ML) 0.083% nebulizer solution 2.5 mg  2.5 mg Inhalation Q6H PRN Paudel, Keshab, MD       amLODipine  (NORVASC ) tablet 10 mg  10 mg Oral Daily Lenon Marien CROME, MD   10 mg at 09/26/24 9082   apixaban  (ELIQUIS ) tablet 2.5 mg  2.5 mg Oral BID Paudel, Keshab, MD   2.5 mg at 09/26/24 9082   busPIRone  (BUSPAR ) tablet 10 mg  10 mg Oral BID PRN Paudel, Keshab, MD       cefTRIAXone  (ROCEPHIN ) 2 g in sodium chloride  0.9 % 100 mL IVPB  2 g Intravenous Q24H Roann Nena, MD 200 mL/hr at 09/26/24 0922 2 g at 09/26/24 9077   Chlorhexidine  Gluconate Cloth 2 % PADS 6 each  6 each Topical Daily Lenon Marien CROME, MD   6 each at 09/25/24 1717   feeding supplement (ENSURE PLUS HIGH PROTEIN) liquid 237 mL  237 mL Oral BID BM Zhang, Dekui, MD       finasteride  (PROSCAR ) tablet 5 mg  5 mg Oral Daily Paudel, Nena, MD   5 mg at 09/26/24 9082   insulin  aspart (novoLOG ) injection 0-9 Units  0-9 Units Subcutaneous TID WC Paudel, Keshab, MD   2 Units at 09/25/24 1717   lisinopril  (ZESTRIL ) tablet 10 mg  10 mg Oral Daily Paudel, Keshab, MD   10 mg at 09/26/24 9082   meclizine  (ANTIVERT ) tablet 12.5 mg  12.5 mg Oral TID PRN Paudel, Keshab, MD       metoprolol  tartrate (LOPRESSOR ) tablet 25 mg  25 mg Oral BID Lenon Marien CROME, MD   25 mg at 09/26/24 9083   morphine  (PF) 2 MG/ML injection 2 mg  2 mg Intravenous Q4H PRN Paudel, Keshab, MD       ondansetron  (ZOFRAN ) tablet 4 mg  4 mg Oral Q6H PRN Roann Nena, MD       Or   ondansetron  (ZOFRAN ) injection 4 mg  4 mg Intravenous Q6H PRN Roann Nena, MD       oxyCODONE  (Oxy IR/ROXICODONE ) immediate release tablet 5 mg  5 mg Oral Q4H PRN Paudel, Keshab, MD       polyethylene glycol (MIRALAX  / GLYCOLAX ) packet 17 g  17 g Oral Daily PRN Paudel, Nena, MD       rosuvastatin  (CRESTOR ) tablet 20 mg  20 mg Oral Daily Paudel, Keshab, MD   20 mg at 09/26/24 9082   tamsulosin  (FLOMAX ) capsule 0.4 mg  0.4 mg Oral QPC supper Paudel, Keshab, MD   0.4 mg at 09/25/24 1717     Discharge Medications: Please see discharge summary for a list of discharge medications.  Relevant Imaging Results:  Relevant Lab  Results:   Additional Information SS#: [id number]  Lauraine JAYSON Carpen, LCSW

## 2024-09-27 DIAGNOSIS — N3000 Acute cystitis without hematuria: Secondary | ICD-10-CM | POA: Diagnosis not present

## 2024-09-27 DIAGNOSIS — J432 Centrilobular emphysema: Secondary | ICD-10-CM | POA: Diagnosis not present

## 2024-09-27 DIAGNOSIS — I482 Chronic atrial fibrillation, unspecified: Secondary | ICD-10-CM | POA: Diagnosis not present

## 2024-09-27 LAB — GLUCOSE, CAPILLARY
Glucose-Capillary: 167 mg/dL — ABNORMAL HIGH (ref 70–99)
Glucose-Capillary: 185 mg/dL — ABNORMAL HIGH (ref 70–99)
Glucose-Capillary: 205 mg/dL — ABNORMAL HIGH (ref 70–99)

## 2024-09-27 MED ORDER — QUETIAPINE FUMARATE 25 MG PO TABS: 25.0000 mg | ORAL_TABLET | Freq: Every day | ORAL | Status: DC

## 2024-09-27 MED ORDER — QUETIAPINE FUMARATE 25 MG PO TABS: 25.0000 mg | ORAL_TABLET | Freq: Every day | ORAL | Status: AC

## 2024-09-27 MED ORDER — CEPHALEXIN 500 MG PO CAPS: 500.0000 mg | ORAL_CAPSULE | Freq: Two times a day (BID) | ORAL | Status: AC

## 2024-09-27 MED ORDER — ACETAMINOPHEN 325 MG PO TABS: 650.0000 mg | ORAL_TABLET | Freq: Four times a day (QID) | ORAL | Status: AC | PRN

## 2024-09-27 MED ORDER — APIXABAN 2.5 MG PO TABS: 2.5000 mg | ORAL_TABLET | Freq: Two times a day (BID) | ORAL | Status: AC

## 2024-09-27 NOTE — Progress Notes (Signed)
°  At 1200 hours, Chaplain On-Call returned to the patient's room and informed him and daughter Arna of my on-going unsuccessful efforts to locate an Chief Operating Officer for authorization of patient's HCPOA document.  At 1350 hours, Chaplain On-Call was involved with a patient care event on another Unit. I called and asked RN Mitzie Croak to notify the patient and family that I am still seeking an Employee Notary, which Chelsea did per her EPIC note.  Chaplain has made 16 unsuccessful calls attempting to locate an Chief Operating Officer.  Chaplain Bebe Ardean EMERSON Hershal., [facility]

## 2024-09-27 NOTE — TOC Transition Note (Signed)
 Transition of Care [facility]) - Discharge Note   Patient Details  Name: Zachary Santos MRN: [id number] Date of Birth: 1935-11-02  [facility] CM/SW Contact:  Dalia GORMAN Fuse, RN Phone Number: 09/27/2024, 3:39 PM   Clinical Narrative:     Patient is medically clear to discharge to [facility]. [facility] to transport. TOC messaged [facility]my at [facility] and they can accept the patient today. Tammy is out of office and advised TOC to speak with Montie.   TOC spoke with Montie and she will call TOC back with bed information.  Nurse to call report to [phone]  He is 7th in line for [facility] transport, it will be very late tonight.   TOC placed call to patient's daughter Jhonnie and son Cheryn with no option to lvmm. TOC also called Malcom and his phone is not in service at this time.   Final next level of care: Skilled Nursing Facility Barriers to Discharge: Barriers Resolved   Patient Goals and CMS Choice     Choice offered to / list presented to : Patient, Adult Children      Discharge Placement              Patient chooses bed at: [facility] [place] Patient to be transferred to facility by: [facility] Name of family member notified: Jhonnie (daughter) [phone] Patient and family notified of of transfer: 09/27/24  Discharge Plan and Services Additional resources added to the After Visit Summary for                                       Social Drivers of Health (SDOH) Interventions SDOH Screenings   Food Insecurity: No Food Insecurity (09/24/2024)  Housing: Low Risk (09/24/2024)  Transportation Needs: No Transportation Needs (09/24/2024)  Utilities: Not At Risk (09/24/2024)  Social Connections: Socially Isolated (09/24/2024)  Tobacco Use: High Risk (09/24/2024)     Readmission Risk Interventions     No data to display

## 2024-09-27 NOTE — Discharge Summary (Signed)
 Physician Discharge Summary   Patient: Zachary Santos MRN: [id number] DOB: Jul 31, 1936  Admit date:     09/24/2024  Discharge date: 09/27/2024  Discharge Physician: Zachary Santos   PCP: Patient, No Pcp Per   Recommendations at discharge:   Follow-up with PCP in 1 week.  Discharge Diagnoses: Principal Problem:   UTI (urinary tract infection) Active Problems:   Essential hypertension, benign   COPD (chronic obstructive pulmonary disease) (HCC)   BPH (benign prostatic hyperplasia)   Anxiety and depression   Atrial fibrillation, chronic (HCC)   Right adrenal mass   Cerebellar mass   Dizziness Chronic kidney disease stage IIIb. Resolved Problems:   * No resolved hospital problems. *  Hospital Course: Zachary Santos is a 88 y.o. male with a PMH significant for COPD, diabetes, CKD, anxiety, BPH, chronic indwelling Foley catheter HTN, DM, HLD, PPM and recent diagnosis of right adrenal mass who came into ED complaining of nausea vomiting and dizziness for the last 3 days.  Patient was here at the emergency room 3 days ago where he was found to have urinary tract infection and was discharged home on oral antibiotics.  He did not fill his medication until yesterday.  Patient stated that since he went home he was not feeling well with continuous nausea, vomiting and dizziness.  She is placed on IV antibiotics for possible UTI. Urine culture came multiple species  Assessment and Plan:  Sepsis due to urinary tract infection- lactic acidosis and leukocytosis. Have resolved.  Treated with 2 days IV Rocephin , urine culture showed multiple species, changed to oral antibiotics with Keflex  for additional 3 days. Condition improved.   Cerebellar mass- as seen on head CT. Possibly contributing to dizziness/generalized weakness. Neurosurgery evaluated and patient not seeking definitive dx or treatment at this time.  - Patient and family did not want further intervention. - Will continue meclizine  and  supportive care    HTN Paroxysmal atrial fibrillation. - Resume home medications - Patient already has pacemaker, currently in sinus rhythm.   BPH - Patient has a chronic Foley catheter Resume home treatment.   COPD without any exacerbation Continue to follow.   Insomnia and delirium. Seroquel added.             Consultants: None Procedures performed: None  Disposition: Skilled nursing facility Diet recommendation:  Cardiac diet DISCHARGE MEDICATION: Allergies as of 09/27/2024       Reactions   Other Rash   After getting pacemaker. The medication applied to skin caused a bad rash         Medication List     STOP taking these medications    albuterol  (2.5 MG/3ML) 0.083% nebulizer solution Commonly known as: PROVENTIL    albuterol  108 (90 Base) MCG/ACT inhaler Commonly known as: VENTOLIN  HFA   amoxicillin-clavulanate 875-125 MG tablet Commonly known as: AUGMENTIN   Anoro Ellipta  62.5-25 MCG/ACT Aepb Generic drug: umeclidinium-vilanterol   azelastine  0.1 % nasal spray Commonly known as: ASTELIN    busPIRone  10 MG tablet Commonly known as: BUSPAR    LORazepam  1 MG tablet Commonly known as: ATIVAN    metFORMIN  500 MG 24 hr tablet Commonly known as: Glucophage  XR   metFORMIN  500 MG tablet Commonly known as: GLUCOPHAGE    tamsulosin  0.4 MG Caps capsule Commonly known as: FLOMAX    temazepam 15 MG capsule Commonly known as: RESTORIL       TAKE these medications    acetaminophen  325 MG tablet Commonly known as: TYLENOL  Take 2 tablets (650 mg total) by  mouth every 6 (six) hours as needed for mild pain (pain score 1-3) or fever (or Fever >/= 101).   amiodarone  200 MG tablet Commonly known as: PACERONE  Take 1 tablet (200 mg total) by mouth daily.   amLODipine  5 MG tablet Commonly known as: NORVASC  Take 5 mg by mouth daily.   apixaban  2.5 MG Tabs tablet Commonly known as: ELIQUIS  Take 1 tablet (2.5 mg total) by mouth 2 (two) times  daily. What changed:  medication strength how much to take   Aspirin  Low Dose 81 MG tablet Generic drug: aspirin  EC Take 81 mg by mouth daily.   bisacodyl 10 MG suppository Commonly known as: DULCOLAX Place 10 mg rectally as needed for moderate constipation.   blood glucose meter kit and supplies Kit Dispense based on patient and insurance preference. Use up to four times daily as directed. (FOR ICD-9 250.00, 250.01).   cephALEXin  500 MG capsule Commonly known as: KEFLEX  Take 1 capsule (500 mg total) by mouth 2 (two) times daily for 2 days.   finasteride  5 MG tablet Commonly known as: Proscar  Take 1 tablet (5 mg total) by mouth daily.   ipratropium-albuterol  0.5-2.5 (3) MG/3ML Soln Commonly known as: DUONEB Take 3 mLs by nebulization 3 (three) times daily.   lisinopril  10 MG tablet Commonly known as: ZESTRIL  Take 10 mg by mouth daily. What changed: Another medication with the same name was removed. Continue taking this medication, and follow the directions you see here.   loperamide 2 MG tablet Commonly known as: IMODIUM A-D Take 2 mg by mouth 4 (four) times daily as needed for diarrhea or loose stools.   nitroGLYCERIN  0.4 MG SL tablet Commonly known as: NITROSTAT  Place 0.4 mg under the tongue every 5 (five) minutes as needed for chest pain.   ondansetron  4 MG disintegrating tablet Commonly known as: ZOFRAN -ODT Take 1 tablet (4 mg total) by mouth every 8 (eight) hours as needed for nausea or vomiting.   ONE TOUCH ULTRA TEST test strip Generic drug: glucose blood USE TEST STRIP TO CHECK GLUCOSE AT LEAST ONCE DAILY   oxybutynin 5 MG 24 hr tablet Commonly known as: DITROPAN-XL Take 5 mg by mouth in the morning.   polyethylene glycol 17 g packet Commonly known as: MIRALAX  / GLYCOLAX  Take 17 g by mouth daily as needed for mild constipation or moderate constipation.   prochlorperazine 10 MG tablet Commonly known as: COMPAZINE Take 10 mg by mouth every 6 (six)  hours as needed for nausea or vomiting.   QUEtiapine 25 MG tablet Commonly known as: SEROQUEL Take 1 tablet (25 mg total) by mouth at bedtime.   rosuvastatin  20 MG tablet Commonly known as: CRESTOR  Take 1 tablet (20 mg total) by mouth daily.   senna 8.6 MG Tabs tablet Commonly known as: SENOKOT Take 2 tablets by mouth 2 (two) times daily as needed for mild constipation.        Contact information for after-discharge care     Destination     [facility] [place], [place]. [place]   Service: Skilled Nursing Contact information: [address] [place]  [address] [phone]                    Discharge Exam: Fredricka Weights   09/24/24 0819  Weight: 65.3 kg   General exam: Appears calm and comfortable  Respiratory system: Clear to auscultation. Respiratory effort normal. Cardiovascular system: S1 & S2 heard, RRR. No JVD, murmurs, rubs, gallops or clicks. No pedal edema.  Gastrointestinal system: Abdomen is nondistended, soft and nontender. No organomegaly or masses felt. Normal bowel sounds heard. Central nervous system: Alert and oriented x2. No focal neurological deficits. Extremities: Symmetric 5 x 5 power. Skin: No rashes, lesions or ulcers Psychiatry:  Mood & affect appropriate.    Condition at discharge: fair  The results of significant diagnostics from this hospitalization (including imaging, microbiology, ancillary and laboratory) are listed below for reference.   Imaging Studies: CT HEAD WO CONTRAST ( ) Addendum Date: 09/24/2024 DDENDUM: Findings were discussed with the ordering physician, Dr. Ernest, at 9:15 am 09/24/24. ---------------------------------------------------- Electronically signed by: Evalene Coho MD 09/24/2024 09:16 AM EST RP Workstation: [id number]   Result Date: 09/24/2024  EXAM: CT HEAD WITHOUT CONTRAST 09/24/2024 08:57:21 AM TECHNIQUE: CT of the head was performed without the administration of intravenous contrast.  Automated exposure control, iterative reconstruction, and/or weight based adjustment of the mA/kV was utilized to reduce the radiation dose to as low as reasonably achievable. COMPARISON: CT of the head dated 10/06/2020. CLINICAL HISTORY: Headache, neuro deficit. FINDINGS: BRAIN AND VENTRICLES: There is an ovoid circumscribed mass noted present centrally within the left cerebellar hemisphere, measuring approximately 2.6 x 2.5 x 1.9 cm. It appears increasing density along its periphery, particularly medially, which could be from hemorrhage or calcification. There is age-related atrophy and moderate periventricular and deep cerebral white matter disease present. The patient is again noted to be status post left suboccipital craniotomy. No evidence of acute infarct. No hydrocephalus. No extra-axial collection. ORBITS: No acute abnormality. The patient is also status post bilateral lens replacement. SINUSES: No acute abnormality. SOFT TISSUES AND SKULL: No acute soft tissue abnormality. No skull fracture. Status post left suboccipital craniotomy. IMPRESSION: 1. Ovoid circumscribed mass centrally within the left cerebellar hemisphere, measuring approximately 2.6 x 2.5 x 1.9 cm, with increasing density along its periphery, possibly due to hemorrhage or calcification. Correlation with an MRI of the brain without and with gadolinium contrast is suggested. Neurosurgery consultation is also recommended. Correlation with a CT of the chest, abdomen and pelvis also might be considered to assess for possible primary neoplasm. 2. Age-related atrophy and moderate periventricular and deep cerebral white matter disease. 3. Status post left suboccipital craniotomy and bilateral lens replacement. Electronically signed by: Evalene Coho MD 09/24/2024 09:07 AM EST RP Workstation: [id number]   DG Chest Portable 1 View Result Date: 09/24/2024 EXAM: 1 VIEW(S) XRAY OF THE CHEST 09/24/2024 08:51:56 AM COMPARISON: 06/20/2023 CLINICAL  HISTORY: SOB FINDINGS: LUNGS AND PLEURA: No focal pulmonary opacity. No pleural effusion. No pneumothorax. HEART AND MEDIASTINUM: Left chest dual lead implantable cardiac device. Aortic atherosclerosis. BONES AND SOFT TISSUES: Degenerative changes of spine. ACDF hardware in partially imaged lower cervical spine. IMPRESSION: 1. No acute findings. Electronically signed by: Evalene Coho MD 09/24/2024 09:08 AM EST RP Workstation: [id number]   CT ABDOMEN PELVIS WO CONTRAST Result Date: 09/21/2024 CLINICAL DATA:  Nausea and vomiting, weakness EXAM: CT ABDOMEN AND PELVIS WITHOUT CONTRAST TECHNIQUE: Multidetector CT imaging of the abdomen and pelvis was performed following the standard protocol without IV contrast. RADIATION DOSE REDUCTION: This exam was performed according to the departmental dose-optimization program which includes automated exposure control, adjustment of the mA and/or kV according to patient size and/or use of iterative reconstruction technique. COMPARISON:  None Available. FINDINGS: Lower chest: No acute pleural or parenchymal lung disease. Hepatobiliary: Cholecystectomy. Unremarkable unenhanced appearance of the liver. No biliary duct dilation. Pancreas: Unremarkable unenhanced appearance. Spleen: Unremarkable unenhanced appearance. Adrenals/Urinary Tract: Multiple bilateral renal cortical cysts do not require  specific imaging follow-up. No urinary tract calculi or obstructive uropathy within either kidney. The bladder is decompressed with a Foley catheter, limiting its evaluation. There is a 3.0 x 3.9 x 6.4 cm right adrenal mass, measuring 25 HU. Left adrenal is unremarkable. Stomach/Bowel: No bowel obstruction or ileus. The appendix is surgically absent. No bowel wall thickening or inflammatory change. Vascular/Lymphatic: Aortic atherosclerosis. No enlarged abdominal or pelvic lymph nodes. Reproductive: Prostate is unremarkable. Other: No free fluid or free intraperitoneal gas. No abdominal  wall hernia. Musculoskeletal: No acute or destructive bony abnormalities. Reconstructed images demonstrate no additional findings. IMPRESSION: 1. Right adrenal mass measuring 6.4 cm, possible malignancy. Recommend surgical consultation. Consider biochemical lab evaluation for functional status and pheochromocytoma prior to resection. JACR 2017 Aug; 14(8):1038-44, JCAT 2016 Mar-Apr; 40(2):194-200, Urol J 2006 Spring; 3(2):71-4. 2. No acute intra-abdominal or intrapelvic process. 3.  Aortic Atherosclerosis (ICD10-I70.0). Electronically Signed   By: Ozell Daring M.D.   On: 09/21/2024 14:56   Microbiology: Results for orders placed or performed during the hospital encounter of 09/24/24  Urine Culture     Status: Abnormal   Collection Time: 09/24/24  8:17 AM   Specimen: Urine, Clean Catch  Result Value Ref Range Status   Specimen Description   Final    URINE, CLEAN CATCH Performed at [facility], [address] [place], [place] [address]    Special Requests   Final    NONE Performed at [facility], [address] [place], [place] [address]    Culture MULTIPLE SPECIES PRESENT, SUGGEST RECOLLECTION (A)  Final   Report Status 09/25/2024 FINAL  Final  Resp panel by RT-PCR (RSV, Flu A&B, Covid) Anterior Nasal Swab     Status: None   Collection Time: 09/24/24  8:18 AM   Specimen: Anterior Nasal Swab  Result Value Ref Range Status   SARS Coronavirus 2 by RT PCR NEGATIVE NEGATIVE Final    Comment: (NOTE) SARS-CoV-2 target nucleic acids are NOT DETECTED.  The SARS-CoV-2 RNA is generally detectable in upper respiratory specimens during the acute phase of infection. The lowest concentration of SARS-CoV-2 viral copies this assay can detect is 138 copies/mL. A negative result does not preclude SARS-Cov-2 infection and should not be used as the sole basis for treatment or other patient management decisions. A negative result may occur with  improper specimen  collection/handling, submission of specimen other than nasopharyngeal swab, presence of viral mutation(s) within the areas targeted by this assay, and inadequate number of viral copies(<138 copies/mL). A negative result must be combined with clinical observations, patient history, and epidemiological information. The expected result is Negative.  Fact Sheet for Patients:  bloggercourse.com  Fact Sheet for Healthcare Providers:  seriousbroker.it  This test is no t yet approved or cleared by the [place]  FDA and  has been authorized for detection and/or diagnosis of SARS-CoV-2 by FDA under an Emergency Use Authorization (EUA). This EUA will remain  in effect (meaning this test can be used) for the duration of the COVID-19 declaration under Section 564(b)(1) of the Act, 21 U.S.C.section 360bbb-3(b)(1), unless the authorization is terminated  or revoked sooner.       Influenza A by PCR NEGATIVE NEGATIVE Final   Influenza B by PCR NEGATIVE NEGATIVE Final    Comment: (NOTE) The Xpert Xpress SARS-CoV-2/FLU/RSV plus assay is intended as an aid in the diagnosis of influenza from Nasopharyngeal swab specimens and should not be used as a sole basis for treatment. Nasal washings and aspirates are unacceptable  for Xpert Xpress SARS-CoV-2/FLU/RSV testing.  Fact Sheet for Patients: bloggercourse.com  Fact Sheet for Healthcare Providers: seriousbroker.it  This test is not yet approved or cleared by the [place]  FDA and has been authorized for detection and/or diagnosis of SARS-CoV-2 by FDA under an Emergency Use Authorization (EUA). This EUA will remain in effect (meaning this test can be used) for the duration of the COVID-19 declaration under Section 564(b)(1) of the Act, 21 U.S.C. section 360bbb-3(b)(1), unless the authorization is terminated or revoked.     Resp Syncytial  Virus by PCR NEGATIVE NEGATIVE Final    Comment: (NOTE) Fact Sheet for Patients: bloggercourse.com  Fact Sheet for Healthcare Providers: seriousbroker.it  This test is not yet approved or cleared by the [place]  FDA and has been authorized for detection and/or diagnosis of SARS-CoV-2 by FDA under an Emergency Use Authorization (EUA). This EUA will remain in effect (meaning this test can be used) for the duration of the COVID-19 declaration under Section 564(b)(1) of the Act, 21 U.S.C. section 360bbb-3(b)(1), unless the authorization is terminated or revoked.  Performed at [facility], [address] [place], [place] [address]   Blood culture (routine x 2)     Status: None (Preliminary result)   Collection Time: 09/24/24  8:29 AM   Specimen: BLOOD LEFT ARM  Result Value Ref Range Status   Specimen Description BLOOD LEFT ARM  Final   Special Requests   Final    BOTTLES DRAWN AEROBIC AND ANAEROBIC Blood Culture results may not be optimal due to an inadequate volume of blood received in culture bottles   Culture   Final    NO GROWTH 3 DAYS Performed at [facility], [address] [place], [place] [address]    Report Status PENDING  Incomplete  Blood culture (routine x 2)     Status: None (Preliminary result)   Collection Time: 09/24/24  8:29 AM   Specimen: BLOOD RIGHT ARM  Result Value Ref Range Status   Specimen Description BLOOD RIGHT ARM  Final   Special Requests   Final    BOTTLES DRAWN AEROBIC AND ANAEROBIC Blood Culture results may not be optimal due to an inadequate volume of blood received in culture bottles   Culture   Final    NO GROWTH 3 DAYS Performed at [facility], [address] [place], [place] [address]    Report Status PENDING  Incomplete    Labs: CBC: Recent Labs  Lab 09/21/24 0720 09/24/24 0829 09/25/24 0802  WBC 14.6* 17.2* 13.8*  NEUTROABS  --  15.6*  --    HGB 11.8* 13.3 11.4*  HCT 35.4* 40.2 33.9*  MCV 93.4 94.8 92.1  PLT 186 221 184   Basic Metabolic Panel: Recent Labs  Lab 09/21/24 0720 09/24/24 0829 09/25/24 0802  NA 141 142 138  K 4.8 4.9 3.9  CL 102 99 103  CO2 26 22 27   GLUCOSE 223* 140* 156*  BUN 34* 31* 30*  CREATININE 1.81* 1.84* 1.61*  CALCIUM  9.6 9.8 9.0   Liver Function Tests: Recent Labs  Lab 09/21/24 0720 09/24/24 0829 09/25/24 0802  AST 20 27 17   ALT 14 21 14   ALKPHOS 148* 159* 123  BILITOT 0.4 0.7 0.3  PROT 6.9 7.0 5.7*  ALBUMIN  4.2 4.1 3.5   CBG: Recent Labs  Lab 09/26/24 1138 09/26/24 1707 09/26/24 2048 09/27/24 0813 09/27/24 1203  GLUCAP 228* 163* 199* 167* 185*    Discharge time spent: 35 minutes.  Signed:  Murvin Mana, MD [facility] 09/27/2024

## 2024-09-27 NOTE — Progress Notes (Signed)
 The patient left with EMS via stretcher for discharge to [facility] in [place]  at 2000.

## 2024-09-27 NOTE — Progress Notes (Signed)
 Report called to [facility].Provided to [facility] LPN. All questions addressed at time of call.

## 2024-09-27 NOTE — Progress Notes (Cosign Needed)
 Pt asked me to put all of his personal items into a bag. I have placed all of items into a pt belonging bag. Pt has cell phone and glasses in possession. JC Student RN

## 2024-09-27 NOTE — Progress Notes (Signed)
 Pt request removal of both IV's. MD okayed removal of IV's per pt request.

## 2024-09-27 NOTE — Progress Notes (Signed)
 Pt reuses all medications and assessment stating he tired if this and that he is getting the hell out of here MD, D. Zhang made aware. Will continue to monitor pt.

## 2024-09-27 NOTE — Plan of Care (Signed)
  Problem: Education: Goal: Ability to describe self-care measures that may prevent or decrease complications (Diabetes Survival Skills Education) will improve Outcome: Progressing Goal: Individualized Educational Video(s) Outcome: Progressing   Problem: Coping: Goal: Ability to adjust to condition or change in health will improve Outcome: Progressing   Problem: Fluid Volume: Goal: Ability to maintain a balanced intake and output will improve Outcome: Progressing   Problem: Health Behavior/Discharge Planning: Goal: Ability to identify and utilize available resources and services will improve Outcome: Progressing Goal: Ability to manage health-related needs will improve Outcome: Progressing   Problem: Metabolic: Goal: Ability to maintain appropriate glucose levels will improve Outcome: Progressing   Problem: Nutritional: Goal: Maintenance of adequate nutrition will improve Outcome: Progressing Goal: Progress toward achieving an optimal weight will improve Outcome: Progressing   Problem: Skin Integrity: Goal: Risk for impaired skin integrity will decrease Outcome: Progressing   Problem: Tissue Perfusion: Goal: Adequacy of tissue perfusion will improve Outcome: Progressing   Problem: Health Behavior/Discharge Planning: Goal: Ability to manage health-related needs will improve Outcome: Progressing   Problem: Clinical Measurements: Goal: Ability to maintain clinical measurements within normal limits will improve Outcome: Progressing Goal: Will remain free from infection Outcome: Progressing Goal: Diagnostic test results will improve Outcome: Progressing Goal: Respiratory complications will improve Outcome: Progressing Goal: Cardiovascular complication will be avoided Outcome: Progressing   Problem: Activity: Goal: Risk for activity intolerance will decrease Outcome: Progressing   Problem: Nutrition: Goal: Adequate nutrition will be maintained Outcome:  Progressing   Problem: Coping: Goal: Level of anxiety will decrease Outcome: Progressing   Problem: Elimination: Goal: Will not experience complications related to bowel motility Outcome: Progressing Goal: Will not experience complications related to urinary retention Outcome: Progressing   Problem: Safety: Goal: Ability to remain free from injury will improve Outcome: Progressing   Problem: Pain Managment: Goal: General experience of comfort will improve and/or be controlled Outcome: Progressing   Problem: Skin Integrity: Goal: Risk for impaired skin integrity will decrease Outcome: Progressing

## 2024-09-27 NOTE — Care Management Important Message (Signed)
 Important Message  Patient Details  Name: Zachary Santos MRN: [id number] Date of Birth: 02-01-1936   Important Message Given:  Yes - Medicare IM     Kortez Murtagh W, CMA 09/27/2024, 1:13 PM

## 2024-09-27 NOTE — Progress Notes (Signed)
 Family in room notified Chaplain is still searching for a notary to finalize documents.

## 2024-09-27 NOTE — Progress Notes (Addendum)
°  Chaplain On-Call responded to a call from Olam Gals, Director of Unit 2-A, who reported that the family of Mr. Campillo was on the second floor and wanted to speak with the Chaplain.  Chaplain met patient's son Jama and daughter Arna at the hallway bench between 2-C and 2-A. Chaplain offered much listening support as they described the patient's weakening condition, and their desire to assist him with the [facility] document.  Chaplain met the patient with family at bedside. The patient stated that he wants Jama to be his HCPOA, and Arna to be the second contact person.  Chaplain provided the [facility] document, which is awaiting the patient's signature to be witnessed and notarized.  Chaplain is attempting to locate the necessary personnel to complete the document.  Chaplain Bebe Ardean EMERSON Hershal., [facility]

## 2024-09-27 NOTE — TOC Progression Note (Addendum)
 Transition of Care [facility]) - Progression Note    Patient Details  Name: Zachary Santos MRN: [id number] Date of Birth: April 19, 1936  Transition of Care [facility]) CM/SW Contact  Dalia GORMAN Fuse, RN Phone Number: 09/27/2024, 1:21 PM  Clinical Narrative:     TOC met with the patient and his daughter in the patient's room. They have revoked hospice and wish to pursue STR. The patient's wife was at [facility] and the daughter was pleased with the cleanliness of the facility . The patient and daughter are in agreement with [facility]. TOC selected [facility] in the hub and made Tammy aware. Nitchia to request ins auth.  Certified in Total Certification# [id number] Dates: 12/11-12/17/2025 Next Review Date: 10/03/2024   PASRR obtained [id number]  Expected Discharge Plan: Home w Hospice Care (Patient is current with [facility]) Barriers to Discharge: Continued Medical Work up               Expected Discharge Plan and Services       Living arrangements for the past 2 months: Single Family Home                                       Social Drivers of Health (SDOH) Interventions SDOH Screenings   Food Insecurity: No Food Insecurity (09/24/2024)  Housing: Low Risk (09/24/2024)  Transportation Needs: No Transportation Needs (09/24/2024)  Utilities: Not At Risk (09/24/2024)  Social Connections: Socially Isolated (09/24/2024)  Tobacco Use: High Risk (09/24/2024)    Readmission Risk Interventions     No data to display

## 2024-09-27 NOTE — Progress Notes (Signed)
°  Progress Note   Patient: Zachary Santos FMW:[id number] DOB: 08-12-1936 DOA: 09/24/2024     2 DOS: the patient was seen and examined on 09/27/2024   Brief hospital course: Zachary Santos is a 88 y.o. male with a PMH significant for COPD, diabetes, CKD, anxiety, BPH, chronic indwelling Foley catheter HTN, DM, HLD, PPM and recent diagnosis of right adrenal mass who came into ED complaining of nausea vomiting and dizziness for the last 3 days.  Patient was here at the emergency room 3 days ago where he was found to have urinary tract infection and was discharged home on oral antibiotics.  He did not fill his medication until yesterday.  Patient stated that since he went home he was not feeling well with continuous nausea, vomiting and dizziness.  She is placed on IV antibiotics for possible UTI. Urine culture came multiple species   Principal Problem:   UTI (urinary tract infection) Active Problems:   Essential hypertension, benign   COPD (chronic obstructive pulmonary disease) (HCC)   BPH (benign prostatic hyperplasia)   Anxiety and depression   Atrial fibrillation, chronic (HCC)   Right adrenal mass   Cerebellar mass   Dizziness   Assessment and Plan: Sepsis due to urinary tract infection- lactic acidosis and leukocytosis. Have resolved.  Treated with 2 days IV Rocephin , urine culture showed multiple species, changed to oral antibiotics with Keflex  for additional 3 days. Condition improved.   Cerebellar mass- as seen on head CT. Possibly contributing to dizziness/generalized weakness. Neurosurgery evaluated and patient not seeking definitive dx or treatment at this time.  - Patient and family did not want further intervention. - Will continue meclizine  and supportive care Currently pending nursing home placement.  HTN Paroxysmal atrial fibrillation. - Resume home medications - Patient already has pacemaker, currently in sinus rhythm.   BPH - Patient has a chronic Foley catheter -  Resume his home medications Proscar  and Flomax    COPD without any exacerbation Continue to follow.   Insomnia and delirium. Patient could not sleep last night, has agitation today.  Added Seroquel tonight.      Subjective:  Patient has some agitation today, he did not sleep well last night.  Physical Exam: Vitals:   09/26/24 1818 09/26/24 2049 09/27/24 0355 09/27/24 0740  BP:  135/61 (!) 149/61 136/66  Pulse: (!) 59 (!) 59 (!) 59 (!) 59  Resp: 17 18 17 18   Temp: 97.6 F (36.4 C) 98.2 F (36.8 C) 97.6 F (36.4 C) 97.8 F (36.6 C)  TempSrc:  Oral    SpO2: 95% 100% 100% 95%  Weight:      Height:       General exam: Appears calm and comfortable  Respiratory system: Clear to auscultation. Respiratory effort normal. Cardiovascular system: S1 & S2 heard, RRR. No JVD, murmurs, rubs, gallops or clicks. No pedal edema. Gastrointestinal system: Abdomen is nondistended, soft and nontender. No organomegaly or masses felt. Normal bowel sounds heard. Central nervous system: Alert and oriented x2.  No focal neurological deficits. Extremities: Symmetric 5 x 5 power. Skin: No rashes, lesions or ulcers Psychiatry:Mood & affect appropriate.    Data Reviewed:  There are no new results to review at this time.  Family Communication: None  Disposition: Status is: Inpatient Remains inpatient appropriate because: Unsafe discharge, pending placement.     Time spent: 35 minutes  Author: Murvin Mana, MD 09/27/2024 1:07 PM  For on call review [email or web address].

## 2024-09-29 LAB — CULTURE, BLOOD (ROUTINE X 2)
Culture: NO GROWTH
Culture: NO GROWTH

## 2024-10-22 ENCOUNTER — Emergency Department

## 2024-10-22 ENCOUNTER — Emergency Department: Admission: EM | Admit: 2024-10-22 | Discharge: 2024-10-23 | Disposition: A | Discharge status: Skilled Nursing Facility

## 2024-10-22 DIAGNOSIS — Z950 Presence of cardiac pacemaker: Secondary | ICD-10-CM | POA: Insufficient documentation

## 2024-10-22 DIAGNOSIS — G9389 Other specified disorders of brain: Secondary | ICD-10-CM | POA: Diagnosis not present

## 2024-10-22 DIAGNOSIS — W19XXXA Unspecified fall, initial encounter: Secondary | ICD-10-CM | POA: Diagnosis not present

## 2024-10-22 DIAGNOSIS — M542 Cervicalgia: Secondary | ICD-10-CM | POA: Diagnosis not present

## 2024-10-22 DIAGNOSIS — R531 Weakness: Secondary | ICD-10-CM | POA: Insufficient documentation

## 2024-10-22 DIAGNOSIS — D72829 Elevated white blood cell count, unspecified: Secondary | ICD-10-CM | POA: Diagnosis not present

## 2024-10-22 DIAGNOSIS — N3000 Acute cystitis without hematuria: Secondary | ICD-10-CM | POA: Insufficient documentation

## 2024-10-22 DIAGNOSIS — R918 Other nonspecific abnormal finding of lung field: Secondary | ICD-10-CM | POA: Diagnosis not present

## 2024-10-22 DIAGNOSIS — M25551 Pain in right hip: Secondary | ICD-10-CM | POA: Insufficient documentation

## 2024-10-22 DIAGNOSIS — N179 Acute kidney failure, unspecified: Secondary | ICD-10-CM | POA: Diagnosis not present

## 2024-10-22 LAB — URINALYSIS, ROUTINE W REFLEX MICROSCOPIC
Bilirubin Urine: NEGATIVE
Glucose, UA: NEGATIVE mg/dL
Ketones, ur: NEGATIVE mg/dL
Nitrite: POSITIVE — AB
Protein, ur: NEGATIVE mg/dL
Specific Gravity, Urine: 1.019 (ref 1.005–1.030)
Squamous Epithelial / HPF: 0 /HPF (ref 0–5)
WBC, UA: >50 WBC/hpf (ref 0–5)
pH: 5 (ref 5.0–8.0)

## 2024-10-22 LAB — BASIC METABOLIC PANEL WITH GFR
Anion gap: 12 (ref 5–15)
BUN: 33 mg/dL — ABNORMAL HIGH (ref 8–23)
CO2: 28 mmol/L (ref 22–32)
Calcium: 8.9 mg/dL (ref 8.9–10.3)
Chloride: 103 mmol/L (ref 98–111)
Creatinine, Ser: 2.19 mg/dL — ABNORMAL HIGH (ref 0.61–1.24)
GFR, Estimated: 29 mL/min — ABNORMAL LOW
Glucose, Bld: 205 mg/dL — ABNORMAL HIGH (ref 70–99)
Potassium: 4.6 mmol/L (ref 3.5–5.1)
Sodium: 143 mmol/L (ref 135–145)

## 2024-10-22 LAB — CBC
HCT: 38.7 % — ABNORMAL LOW (ref 39.0–52.0)
Hemoglobin: 12.3 g/dL — ABNORMAL LOW (ref 13.0–17.0)
MCH: 30.8 pg (ref 26.0–34.0)
MCHC: 31.8 g/dL (ref 30.0–36.0)
MCV: 96.8 fL (ref 80.0–100.0)
Platelets: 177 K/uL (ref 150–400)
RBC: 4 MIL/uL — ABNORMAL LOW (ref 4.22–5.81)
RDW: 13 % (ref 11.5–15.5)
WBC: 12.1 K/uL — ABNORMAL HIGH (ref 4.0–10.5)
nRBC: 0 % (ref 0.0–0.2)

## 2024-10-22 MED ORDER — MORPHINE SULFATE (PF) 4 MG/ML IV SOLN
4.0000 mg | Freq: Once | INTRAVENOUS | Status: DC
  Filled 2024-10-22: qty 1

## 2024-10-22 MED ORDER — CEFDINIR 300 MG PO CAPS: 300.0000 mg | ORAL_CAPSULE | Freq: Two times a day (BID) | ORAL | 0 refills | Status: AC

## 2024-10-22 MED ORDER — CEFUROXIME AXETIL 500 MG PO TABS
500.0000 mg | ORAL_TABLET | Freq: Once | ORAL | Status: AC
  Administered 2024-10-22: 500 mg via ORAL
  Filled 2024-10-22: qty 1

## 2024-10-22 NOTE — ED Provider Notes (Signed)
 "  [facility] Provider Note    Event Date/Time   First MD Initiated Contact with Patient 10/22/24 1536     (approximate)   History   Fall   HPI  IORI GIGANTE is a 89 y.o. male presenting today with concern of a fall.  Patient initially told nursing staff that he did not hit his head but to me he says he is not sure does not entirely remember the fall.  Complaining primarily of right hip pain, as well as some mid neck pain.  Unfortunately limited historian having difficulty recalling the event.  He tells me that he has been having increased weakness recently and increased frequency of falling.  Providing me with limited further history, the seem to have a chronic Foley in place.     Physical Exam   Triage Vital Signs: ED Triage Vitals  Encounter Vitals Group     BP 10/22/24 1527 124/79     Girls Systolic BP Percentile --      Girls Diastolic BP Percentile --      Boys Systolic BP Percentile --      Boys Diastolic BP Percentile --      Pulse Rate 10/22/24 1527 63     Resp 10/22/24 1527 18     Temp 10/22/24 1529 97.9 F (36.6 C)     Temp Source 10/22/24 1529 Oral     SpO2 10/22/24 1527 97 %     Weight 10/22/24 1529 138 lb 10.7 oz (62.9 kg)     Height 10/22/24 1529 6' (1.829 m)     Head Circumference --      [facility] --      Pain Score 10/22/24 1528 5     Pain Loc --      Pain Education --      Exclude from Growth Chart --     Most recent vital signs: Vitals:   10/22/24 1730 10/22/24 1800  BP: (!) 149/74 139/74  Pulse: 60 60  Resp: 19 18  Temp:    SpO2: 97% 100%     General: Awake, no distress.  CV:  Good peripheral perfusion.  Resp:  Normal effort.  Abd:  No distention.  MSK:  There is cervical midline tenderness on my exam.  Of the right hip also has tenderness to palpation, but I do not appreciate any deformities, the right foot has appropriate pulses, he is able to move the toes, does not appear to be rotated or shortened.   Remaining extremities are nontender to palpation. Other:     ED Results / Procedures / Treatments   Labs (all labs ordered are listed, but only abnormal results are displayed) Labs Reviewed  CBC - Abnormal; Notable for the following components:      Result Value   WBC 12.1 (*)    RBC 4.00 (*)    Hemoglobin 12.3 (*)    HCT 38.7 (*)    All other components within normal limits  BASIC METABOLIC PANEL WITH GFR - Abnormal; Notable for the following components:   Glucose, Bld 205 (*)    BUN 33 (*)    Creatinine, Ser 2.19 (*)    GFR, Estimated 29 (*)    All other components within normal limits  URINALYSIS, ROUTINE W REFLEX MICROSCOPIC - Abnormal; Notable for the following components:   Color, Urine YELLOW (*)    APPearance HAZY (*)    Hgb urine dipstick SMALL (*)    Nitrite POSITIVE (*)  Leukocytes,Ua MODERATE (*)    Bacteria, UA RARE (*)    All other components within normal limits     EKG  Appears to be a sinus rhythm with rate of about 60, axis of -70, QRS is widened consistent with a left bundle branch block, remaining intervals appear to be within normal limits with a slightly prolonged QTc, no obvious ischemia that I appreciate on this EKG   RADIOLOGY   PROCEDURES:  Critical Care performed: No  Procedures   MEDICATIONS ORDERED IN ED: Medications  morphine  (PF) 4 MG/ML injection 4 mg (4 mg Intravenous Patient Refused/Not Given 10/22/24 1616)  cefUROXime  (CEFTIN ) tablet 500 mg (has no administration in time range)     IMPRESSION / MDM / ASSESSMENT AND PLAN / ED COURSE  I reviewed the triage vital signs and the nursing notes.                               Patient's presentation is most consistent with acute complicated illness / injury requiring diagnostic workup.  89 year old male who presents today with concern of a fall from standing.  Limited historian, complaining of right hip pain, and apparently increased frequency of falls and increased weakness  recently.  He has a Foley catheter present, and the urine appears very thick and purulent, suspect likely underlying UTI which may be resulting in his increased weakness.  Obtaining labs here and will follow-up imaging, unfortunately unclear if he actually did hit his head or not so we will go ahead and obtain CT imaging of the head as well as x-ray of the hip.  Will follow-up remaining labs and determine appropriate disposition accordingly.   Clinical Course as of 10/22/24 1944  Mon Oct 22, 2024  1742 I spoke with the patient, still awaiting CT imaging at this time as well as urinalysis.  He does explain to me that he has been having some burning with urination.  I explained his presence of AKI as well as slight leukocytosis along with his symptomatology, would recommend admission.  He verbalized understanding but is not interested in admission at this time and wants to go home, daughter was at bedside feels reasonable given his current mental status, we will follow-up urinalysis and CT imaging but anticipate likely discharge home. [SK]  1838 I discussed CT results regarding his brain mass as well as questionable lung mass.  He verbalized understanding he is not seeking further treatment for this at this time.  We are still waiting urinalysis to see if UTI would be warranted to treat as he is having some symptoms from this, but seems primarily interested in comfort measures at this time, and antibiotics in the situation would be comfort given his symptoms of burning with urination. [SK]  1925 Urinalysis demonstrating evidence of urinary tract infection.  Will have the patient started on antibiotics, change foley catheter and discharged home at this time. [SK]    Clinical Course User Index [SK] Fernand Rossie HERO, MD     FINAL CLINICAL IMPRESSION(S) / ED DIAGNOSES   Final diagnoses:  Acute cystitis without hematuria  Lung mass  Brain mass  AKI (acute kidney injury)     Rx / DC Orders   ED  Discharge Orders          Ordered    cefdinir  (OMNICEF ) 300 MG capsule  2 times daily        10/22/24 1927  Note:  This document was prepared using Dragon voice recognition software and may include unintentional dictation errors.   Fernand Rossie HERO, MD 10/22/24 1945  "

## 2024-10-22 NOTE — ED Notes (Signed)
 Called to [facility] per RN Tamara/[facility]/Rep Marybelle.

## 2024-10-22 NOTE — ED Triage Notes (Signed)
 Pt presents to the ED via [facility] from [facility]. Pt reports that his legs gave out. Pt does take blood thinners. Did not hit head. Pt A&Ox4. No LOC. Pt reports right hip pain.  Pt does have a pacemaker  68 HR  98% 116/60

## 2024-10-22 NOTE — ED Notes (Signed)
 Pt cleaned of stool at this time

## 2024-10-22 NOTE — Discharge Instructions (Signed)
 You were seen today due to concern of a urinary infection.  At this time as we have discussed, your kidney function has been slightly elevated, your urine appears to demonstrate a urinary tract infection, we have started you on some antibiotics for this, please take these as instructed.  As you are already aware there is also a lesion in your brain, and today we have also found a lesion in your lungs, concerning for possibly underlying cancer.  If you are interested in getting this treated or further workup, please follow-up with the oncology team using the phone number provided to arrange for a follow-up appointment.  If you have any worsening of symptoms please return to the emergency department immediately for further medical management.

## 2024-10-24 ENCOUNTER — Encounter: Payer: Self-pay | Admitting: *Deleted

## 2024-10-26 ENCOUNTER — Inpatient Hospital Stay

## 2024-10-26 ENCOUNTER — Inpatient Hospital Stay: Attending: Oncology | Admitting: Oncology

## 2024-10-26 ENCOUNTER — Encounter: Payer: Self-pay | Admitting: Oncology

## 2024-10-26 VITALS — BP 115/64 | HR 64 | Temp 96.0°F | Resp 18 | Wt 144.0 lb

## 2024-10-26 DIAGNOSIS — R918 Other nonspecific abnormal finding of lung field: Secondary | ICD-10-CM | POA: Diagnosis not present

## 2024-10-26 DIAGNOSIS — G9389 Other specified disorders of brain: Secondary | ICD-10-CM

## 2024-10-26 DIAGNOSIS — I4891 Unspecified atrial fibrillation: Secondary | ICD-10-CM | POA: Insufficient documentation

## 2024-10-26 DIAGNOSIS — Z7901 Long term (current) use of anticoagulants: Secondary | ICD-10-CM | POA: Insufficient documentation

## 2024-10-26 DIAGNOSIS — G939 Disorder of brain, unspecified: Secondary | ICD-10-CM | POA: Insufficient documentation

## 2024-10-26 DIAGNOSIS — E279 Disorder of adrenal gland, unspecified: Secondary | ICD-10-CM | POA: Insufficient documentation

## 2024-10-27 DIAGNOSIS — R918 Other nonspecific abnormal finding of lung field: Secondary | ICD-10-CM | POA: Insufficient documentation

## 2024-10-27 NOTE — Assessment & Plan Note (Signed)
 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  Currently he denies any neurological symptoms.  I recommend steroids and radiation therapy.  Patient declined

## 2024-10-27 NOTE — Assessment & Plan Note (Addendum)
 Current imaging results were reviewed and discussed patient. he has right apical lung mass invading mediastinum and T2, adrenal mass and a brain mass. Clinical picture is suspicious for metastatic lung cancer. Patient has multiple comorbidities and is not interested in any workup, biopsy or cancer treatments. I recommend hospice.  Patient is interested.   I discussed with palliative care provider [facility].  Patient has a phone visit appointment with him next week. He is on Eliquis  for atrial fibrillation.  Recommend hold

## 2024-10-27 NOTE — Progress Notes (Signed)
 " Hematology/Oncology Consult note [phone] [phone]        REFERRING PROVIDER: Fernand Rossie HERO, MD   CHIEF COMPLAINTS/REASON FOR VISIT:  Evaluation of    ASSESSMENT & PLAN:   Cerebellar mass Suspect metastatic brain lesion He has surrounding mild anemia and hemorrhagic changes.  Currently he denies any neurological symptoms.  I recommend steroids and radiation therapy.  Patient declined  Lung mass Current imaging results were reviewed and discussed patient. he has right apical lung mass invading mediastinum and T2, adrenal mass and a brain mass. Clinical picture is suspicious for metastatic lung cancer. Patient has multiple comorbidities and is not interested in any workup, biopsy or cancer treatments. I recommend hospice.  Patient is interested.   I discussed with palliative care provider [facility].  Patient has a phone visit appointment with him next week. He is on Eliquis  for atrial fibrillation.  Recommend hold   No orders of the defined types were placed in this encounter.   All questions were answered. The patient knows to call the clinic with any problems, questions or concerns.  Zelphia Cap, MD, PhD [facility] Hematology Oncology 10/26/2024   HISTORY OF PRESENTING ILLNESS:   Zachary Santos is a  89 y.o.  male with PMH listed below was seen in consultation at the request of  Zachary Rossie HERO, MD  for evaluation of brain mass and a lung mass.  Discussed the use of AI scribe software for clinical note transcription with the patient, who gave verbal consent to proceed.    Recent imaging revealed a 4.3 cm right upper lobe lung mass with mediastinal invasion and possible involvement of T2, a 6 cm adrenal mass, and a 2.6 x 2.3 left cerebellar hemisphere mass with mild edema and hemorrhage. He currently resides in a nursing home and receives supportive care.  He denies shortness of breath and has no current neurological deficits, including  weakness, facial droop, or vision loss. He has not received steroids recently.  Patient has multiple comorbidities including CKD, COPD, claudication, hypertension hyperlipidemia tobacco abuse type 2 diabetes, atrial fibrillation He has a history of heart failure requiring in-home care three times weekly, with no significant improvement in cardiac function. He is not currently enrolled in hospice care but receives nursing home support.  He expressed a clear preference to avoid further cancer-directed interventions, including biopsy, radiation, immunotherapy, or steroids, and wishes to focus on comfort and quality of life. He is open to transition to hospice care.  MEDICAL HISTORY:  Past Medical History:  Diagnosis Date   Anxiety    BPH (benign prostatic hyperplasia)    Chronic fatigue    CKD (chronic kidney disease), stage III (HCC)    Claudication    a. R Hip.   COPD (chronic obstructive pulmonary disease) (HCC)    Dyspnea    with exertion   Essential hypertension    a. 01/2008 Echo: EF 65%, no rwma, mod increased PASP.   Hypercholesterolemia    Osteoarthritis    Paget's disease    Pneumonia due to COVID-19 virus 10/31/2020   Syncope    a. 03/2015 in setting of hypotension.   Tobacco abuse    a. 1-1.5 ppd x 60+ yrs.   Type II diabetes mellitus (HCC)    Wears partial dentures    uppers    SURGICAL HISTORY: Past Surgical History:  Procedure Laterality Date   APPENDECTOMY     CATARACT EXTRACTION W/PHACO Left 12/11/2019   Procedure: CATARACT EXTRACTION  PHACO AND INTRAOCULAR LENS PLACEMENT (IOC) LEFT DIABETIC MALYUGIN;  Surgeon: Jaye Fallow, MD;  Location: [facility];  Service: Ophthalmology;  Laterality: Left;  CDE 11.45 U/S  1:05.7   CATARACT EXTRACTION W/PHACO Right 01/08/2020   Procedure: CATARACT EXTRACTION PHACO AND INTRAOCULAR LENS PLACEMENT (IOC) RIGHT DIABETIC 5.75  00:49.3;  Surgeon: Jaye Fallow, MD;  Location: [facility];  Service:  Ophthalmology;  Laterality: Right;  Diabetic   laparscopic chole     PACEMAKER IMPLANT N/A 10/08/2020   Procedure: PACEMAKER IMPLANT;  Surgeon: Cindie Ole DASEN, MD;  Location: [facility] INVASIVE CV LAB;  Service: Cardiovascular;  Laterality: N/A;   SPINE SURGERY     cervical spine surgery   trigeminal neuralgia     with surgery in [place]    SOCIAL HISTORY: Social History   Socioeconomic History   Marital status: Married    Spouse name: Not on file   Number of children: Not on file   Years of education: Not on file   Highest education level: Not on file  Occupational History   Not on file  Tobacco Use   Smoking status: Former    Types: Cigarettes    Start date: 09/1944   Smokeless tobacco: Current  Substance and Sexual Activity   Alcohol use: Not Currently    Alcohol/week: 0.0 standard drinks of alcohol    Comment: rare - 1% of the time.   Drug use: No   Sexual activity: Not Currently  Other Topics Concern   Not on file  Social History Narrative   Lives in [place] with wife.  Retired naval architect.  Very sedentary.  Avoids activity b/c he gives out and is limited by right hip/buttock discomfort/claudication.   Social Drivers of Health   Tobacco Use: High Risk (10/26/2024)   Patient History    Smoking Tobacco Use: Former    Smokeless Tobacco Use: Current    Passive Exposure: Not on file  Financial Resource Strain: Low Risk (10/26/2024)   Overall Financial Resource Strain (CARDIA)    Difficulty of Paying Living Expenses: Not very hard  Food Insecurity: No Food Insecurity (10/26/2024)   Epic    Worried About Programme Researcher, Broadcasting/film/video in the Last Year: Never true    Ran Out of Food in the Last Year: Never true  Transportation Needs: No Transportation Needs (09/24/2024)   Epic    Lack of Transportation (Medical): No    Lack of Transportation (Non-Medical): No  Physical Activity: Not on file  Stress: No Stress Concern Present (10/26/2024)   [employer] of  Occupational Health - Occupational Stress Questionnaire    Feeling of Stress: Only a little  Social Connections: Socially Isolated (09/24/2024)   Social Connection and Isolation Panel    Frequency of Communication with Friends and Family: More than three times a week    Frequency of Social Gatherings with Friends and Family: More than three times a week    Attends Religious Services: Never    Database Administrator or Organizations: No    Attends Banker Meetings: Never    Marital Status: Widowed  Intimate Partner Violence: Not At Risk (10/26/2024)   Epic    Fear of Current or Ex-Partner: No    Emotionally Abused: No    Physically Abused: No    Sexually Abused: No  Depression (PHQ2-9): Low Risk (10/26/2024)   Depression (PHQ2-9)    PHQ-2 Score: 4  Alcohol Screen: Not on file  Housing: Low Risk (10/26/2024)  Epic    Unable to Pay for Housing in the Last Year: No    Number of Times Moved in the Last Year: 0    Homeless in the Last Year: No  Utilities: Not At Risk (10/26/2024)   Epic    Threatened with loss of utilities: No  Health Literacy: Adequate Health Literacy (10/26/2024)   B1300 Health Literacy    Frequency of need for help with medical instructions: Never    FAMILY HISTORY: Family History  Problem Relation Age of Onset   Cancer Mother        died @ 59.   COPD Father        died @ 79.   Diabetes Brother        died in early 23's.    ALLERGIES:  is allergic to other.  MEDICATIONS:  Current Outpatient Medications  Medication Sig Dispense Refill   acetaminophen  (TYLENOL ) 325 MG tablet Take 2 tablets (650 mg total) by mouth every 6 (six) hours as needed for mild pain (pain score 1-3) or fever (or Fever >/= 101).     amiodarone  (PACERONE ) 200 MG tablet Take 1 tablet (200 mg total) by mouth daily. 30 tablet 3   apixaban  (ELIQUIS ) 2.5 MG TABS tablet Take 1 tablet (2.5 mg total) by mouth 2 (two) times daily.     ASPIRIN  LOW DOSE 81 MG tablet Take 81 mg by mouth  daily.     bisacodyl (DULCOLAX) 10 MG suppository Place 10 mg rectally as needed for moderate constipation.     blood glucose meter kit and supplies KIT Dispense based on patient and insurance preference. Use up to four times daily as directed. (FOR ICD-9 250.00, 250.01). 1 each 0   cefdinir  (OMNICEF ) 300 MG capsule Take 1 capsule (300 mg total) by mouth 2 (two) times daily for 7 days. 14 capsule 0   ipratropium-albuterol  (DUONEB) 0.5-2.5 (3) MG/3ML SOLN Take 3 mLs by nebulization 3 (three) times daily.     lisinopril  (ZESTRIL ) 10 MG tablet Take 10 mg by mouth daily.     loperamide (IMODIUM A-D) 2 MG tablet Take 2 mg by mouth 4 (four) times daily as needed for diarrhea or loose stools.     nitroGLYCERIN  (NITROSTAT ) 0.4 MG SL tablet Place 0.4 mg under the tongue every 5 (five) minutes as needed for chest pain.     ondansetron  (ZOFRAN -ODT) 4 MG disintegrating tablet Take 1 tablet (4 mg total) by mouth every 8 (eight) hours as needed for nausea or vomiting. 20 tablet 0   ONE TOUCH ULTRA TEST test strip USE TEST STRIP TO CHECK GLUCOSE AT LEAST ONCE DAILY 50 each 11   oxybutynin (DITROPAN-XL) 5 MG 24 hr tablet Take 5 mg by mouth in the morning.     polyethylene glycol (MIRALAX  / GLYCOLAX ) 17 g packet Take 17 g by mouth daily as needed for mild constipation or moderate constipation.     prochlorperazine (COMPAZINE) 10 MG tablet Take 10 mg by mouth every 6 (six) hours as needed for nausea or vomiting.     QUEtiapine  (SEROQUEL ) 25 MG tablet Take 1 tablet (25 mg total) by mouth at bedtime.     senna (SENOKOT) 8.6 MG TABS tablet Take 2 tablets by mouth 2 (two) times daily as needed for mild constipation.     amLODipine  (NORVASC ) 5 MG tablet Take 5 mg by mouth daily. (Patient not taking: Reported on 10/26/2024)     finasteride  (PROSCAR ) 5 MG tablet Take 1 tablet (5 mg  total) by mouth daily. (Patient not taking: Reported on 10/26/2024) 30 tablet 2   rosuvastatin  (CRESTOR ) 20 MG tablet Take 1 tablet (20 mg total) by  mouth daily. (Patient not taking: Reported on 10/26/2024) 90 tablet 3   No current facility-administered medications for this visit.    Review of Systems  Constitutional:  Positive for appetite change, fatigue and unexpected weight change. Negative for chills and fever.  HENT:   Negative for hearing loss and voice change.   Eyes:  Negative for eye problems.  Respiratory:  Negative for chest tightness, cough and shortness of breath.   Cardiovascular:  Negative for chest pain.  Gastrointestinal:  Negative for abdominal distention, abdominal pain and blood in stool.  Endocrine: Negative for hot flashes.  Genitourinary:  Negative for difficulty urinating and frequency.   Musculoskeletal:  Negative for arthralgias.  Skin:  Negative for itching and rash.  Neurological:  Positive for headaches. Negative for extremity weakness.  Hematological:  Negative for adenopathy.  Psychiatric/Behavioral:  Negative for confusion.    PHYSICAL EXAMINATION:  Vitals:   10/26/24 1224  BP: 115/64  Pulse: 64  Resp: 18  Temp: (!) 96 F (35.6 C)  SpO2: 99%   Filed Weights   10/26/24 1224  Weight: 144 lb (65.3 kg)    Physical Exam Constitutional:      General: He is not in acute distress.    Appearance: He is ill-appearing.     Comments: Patient sits in a wheelchair  HENT:     Head: Normocephalic and atraumatic.  Eyes:     General: No scleral icterus. Cardiovascular:     Rate and Rhythm: Normal rate.  Pulmonary:     Effort: Pulmonary effort is normal. No respiratory distress.     Breath sounds: No wheezing.     Comments: Decreased breath sound bilaterally Abdominal:     Palpations: Abdomen is soft.  Musculoskeletal:        General: Normal range of motion.     Cervical back: Normal range of motion and neck supple.     Right lower leg: No edema.     Left lower leg: No edema.  Skin:    Findings: No erythema or rash.  Neurological:     Mental Status: He is alert and oriented to person,  place, and time. Mental status is at baseline.  Psychiatric:        Mood and Affect: Mood normal.     LABORATORY DATA:  I have reviewed the data as listed    Latest Ref Rng & Units 10/22/2024    3:30 PM 09/25/2024    8:02 AM 09/24/2024    8:29 AM  CBC  WBC 4.0 - 10.5 K/uL 12.1  13.8  17.2   Hemoglobin 13.0 - 17.0 g/dL 87.6  88.5  86.6   Hematocrit 39.0 - 52.0 % 38.7  33.9  40.2   Platelets 150 - 400 K/uL 177  184  221       Latest Ref Rng & Units 10/22/2024    3:30 PM 09/25/2024    8:02 AM 09/24/2024    8:29 AM  CMP  Glucose 70 - 99 mg/dL 794  843  859   BUN 8 - 23 mg/dL 33  30  31   Creatinine 0.61 - 1.24 mg/dL 7.80  8.38  8.15   Sodium 135 - 145 mmol/L 143  138  142   Potassium 3.5 - 5.1 mmol/L 4.6  3.9  4.9   Chloride 98 -  111 mmol/L 103  103  99   CO2 22 - 32 mmol/L 28  27  22    Calcium  8.9 - 10.3 mg/dL 8.9  9.0  9.8   Total Protein 6.5 - 8.1 g/dL  5.7  7.0   Total Bilirubin 0.0 - 1.2 mg/dL  0.3  0.7   Alkaline Phos 38 - 126 U/L  123  159   AST 15 - 41 U/L  17  27   ALT 0 - 44 U/L  14  21       RADIOGRAPHIC STUDIES: I have personally reviewed the radiological images as listed and agreed with the findings in the report. CT Cervical Spine Wo Contrast Result Date: 10/22/2024 EXAM: CT CERVICAL SPINE WITHOUT CONTRAST 10/22/2024 05:50:49 PM TECHNIQUE: CT of the cervical spine was performed without the administration of intravenous contrast. Multiplanar reformatted images are provided for review. Automated exposure control, iterative reconstruction, and/or weight based adjustment of the mA/kV was utilized to reduce the radiation dose to as low as reasonably achievable. COMPARISON: CT cervical spine 10/06/2020. CLINICAL HISTORY: fall, trauma FINDINGS: BONES AND ALIGNMENT: Chronic straightening of the normal cervical lordosis. C4-C6 ACDF with solid arthrodesis. Interbody ankylosis at C6-C7. No acute fracture or traumatic malalignment. DEGENERATIVE CHANGES: Moderate spondylosis at C3-C4  and C7-T1. Asymmetrically advanced facet arthrosis on the left at C2-C3 and on the right at C3-C4. At least moderate spinal stenosis and severe right neural foraminal stenosis at C3-C4. SOFT TISSUES: No prevertebral soft tissue swelling. Partially visualized right apical lung mass measuring at least 4.3 x 3.6 cm with mediastinal invasion and possible involvement of the T2 vertebral body. IMPRESSION: 1. No acute cervical spine fracture or traumatic malalignment. 2. Partially visualized right apical lung mass measuring at least 4.3 cm, most likely reflecting primary lung malignancy. Recommend further evaluation with chest CT if the patient would desire treatment. Electronically signed by: Dasie Hamburg MD 10/22/2024 06:19 PM EST RP Workstation: [id number]   CT Head Wo Contrast Result Date: 10/22/2024 EXAM: CT HEAD WITHOUT CONTRAST 10/22/2024 05:50:49 PM TECHNIQUE: CT of the head was performed without the administration of intravenous contrast. Automated exposure control, iterative reconstruction, and/or weight based adjustment of the mA/kV was utilized to reduce the radiation dose to as low as reasonably achievable. COMPARISON: Head CT 09/24/2024. CLINICAL HISTORY: Fall, trauma. FINDINGS: BRAIN AND VENTRICLES: A mass superiorly in the left cerebellar hemisphere measures approximately 2.6 x 2.3 cm, similar in size to the prior CT although the peripheral hyperdense components on the previous study have mildly decreased in overall volume suggestive of evolving blood products. Adjacent mild left cerebellar edema is similar to the prior study with very mild regional mass effect. No new intracranial hemorrhage, supratentorial mass effect, acute infarct, hydrocephalus, or extra-axial fluid collection is identified. Patchy to confluent hypodensities in the cerebral white matter bilaterally are similar to the prior study and nonspecific but compatible with moderate chronic small vessel ischemic disease. There is mild cerebral  atrophy. Calcified atherosclerosis at the skull base. ORBITS: Bilateral cataract extraction. SINUSES: Increased, moderate volume of fluid in the right sphenoid sinus. Increased, moderate bilateral ethmoid sinus mucosal thickening and mild mucosal thickening in the left sphenoid sinus. Clear mastoid air cells. SOFT TISSUES AND SKULL: Left retromastoid craniectomy. No acute soft tissue abnormality. No acute skull fracture. IMPRESSION: 1. Hemorrhagic left cerebellar mass, similar in size to the prior CT with unchanged mild edema. 2. No evidence of an acute intracranial abnormality. 3. Moderate chronic small vessel ischemic disease. 4. Increased mucosal thickening  and fluid in the paranasal sinuses, correlate for acute sinusitis. Electronically signed by: Dasie Hamburg MD 10/22/2024 06:11 PM EST RP Workstation: [id number]   DG Hip Unilat W or Wo Pelvis 2-3 Views Right Result Date: 10/22/2024 CLINICAL DATA:  Right hip pain status post fall. EXAM: DG HIP (WITH OR WITHOUT PELVIS) 2-3V RIGHT COMPARISON:  None Available. FINDINGS: There is no evidence of an acute hip fracture or dislocation. A thin, linear sclerotic area is seen just below the inter trochanteric region of the proximal right femur. Mild to moderate severity degenerative changes are seen in the form of acetabular sclerosis and lateral acetabular bony spurring. IMPRESSION: 1. No acute fracture or dislocation. 2. Mild to moderate severity degenerative changes. Electronically Signed   By: Suzen Dials M.D.   On: 10/22/2024 17:14   CT HEAD WO CONTRAST ( ) Addendum Date: 09/24/2024 ADDENDUM: Findings were discussed with the ordering physician, Dr. Ernest, at 9:15 am 09/24/24. ---------------------------------------------------- Electronically signed by: Evalene Coho MD 09/24/2024 09:16 AM EST RP Workstation: [id number]   Result Date: 09/24/2024 ORIGINAL REPORT EXAM: CT HEAD WITHOUT CONTRAST 09/24/2024 08:57:21 AM TECHNIQUE: CT of the head was  performed without the administration of intravenous contrast. Automated exposure control, iterative reconstruction, and/or weight based adjustment of the mA/kV was utilized to reduce the radiation dose to as low as reasonably achievable. COMPARISON: CT of the head dated 10/06/2020. CLINICAL HISTORY: Headache, neuro deficit. FINDINGS: BRAIN AND VENTRICLES: There is an ovoid circumscribed mass noted present centrally within the left cerebellar hemisphere, measuring approximately 2.6 x 2.5 x 1.9 cm. It appears increasing density along its periphery, particularly medially, which could be from hemorrhage or calcification. There is age-related atrophy and moderate periventricular and deep cerebral white matter disease present. The patient is again noted to be status post left suboccipital craniotomy. No evidence of acute infarct. No hydrocephalus. No extra-axial collection. ORBITS: No acute abnormality. The patient is also status post bilateral lens replacement. SINUSES: No acute abnormality. SOFT TISSUES AND SKULL: No acute soft tissue abnormality. No skull fracture. Status post left suboccipital craniotomy. IMPRESSION: 1. Ovoid circumscribed mass centrally within the left cerebellar hemisphere, measuring approximately 2.6 x 2.5 x 1.9 cm, with increasing density along its periphery, possibly due to hemorrhage or calcification. Correlation with an MRI of the brain without and with gadolinium contrast is suggested. Neurosurgery consultation is also recommended. Correlation with a CT of the chest, abdomen and pelvis also might be considered to assess for possible primary neoplasm. 2. Age-related atrophy and moderate periventricular and deep cerebral white matter disease. 3. Status post left suboccipital craniotomy and bilateral lens replacement. Electronically signed by: Evalene Coho MD 09/24/2024 09:07 AM EST RP Workstation: [id number]   DG Chest Portable 1 View Result Date: 09/24/2024 EXAM: 1 VIEW(S) XRAY OF THE  CHEST 09/24/2024 08:51:56 AM COMPARISON: 06/20/2023 CLINICAL HISTORY: SOB FINDINGS: LUNGS AND PLEURA: No focal pulmonary opacity. No pleural effusion. No pneumothorax. HEART AND MEDIASTINUM: Left chest dual lead implantable cardiac device. Aortic atherosclerosis. BONES AND SOFT TISSUES: Degenerative changes of spine. ACDF hardware in partially imaged lower cervical spine. IMPRESSION: 1. No acute findings. Electronically signed by: Evalene Coho MD 09/24/2024 09:08 AM EST RP Workstation: [id number]   CT ABDOMEN PELVIS WO CONTRAST Result Date: 09/21/2024 CLINICAL DATA:  Nausea and vomiting, weakness EXAM: CT ABDOMEN AND PELVIS WITHOUT CONTRAST TECHNIQUE: Multidetector CT imaging of the abdomen and pelvis was performed following the standard protocol without IV contrast. RADIATION DOSE REDUCTION: This exam was performed according to the departmental dose-optimization program which includes  automated exposure control, adjustment of the mA and/or kV according to patient size and/or use of iterative reconstruction technique. COMPARISON:  None Available. FINDINGS: Lower chest: No acute pleural or parenchymal lung disease. Hepatobiliary: Cholecystectomy. Unremarkable unenhanced appearance of the liver. No biliary duct dilation. Pancreas: Unremarkable unenhanced appearance. Spleen: Unremarkable unenhanced appearance. Adrenals/Urinary Tract: Multiple bilateral renal cortical cysts do not require specific imaging follow-up. No urinary tract calculi or obstructive uropathy within either kidney. The bladder is decompressed with a Foley catheter, limiting its evaluation. There is a 3.0 x 3.9 x 6.4 cm right adrenal mass, measuring 25 HU. Left adrenal is unremarkable. Stomach/Bowel: No bowel obstruction or ileus. The appendix is surgically absent. No bowel wall thickening or inflammatory change. Vascular/Lymphatic: Aortic atherosclerosis. No enlarged abdominal or pelvic lymph nodes. Reproductive: Prostate is unremarkable.  Other: No free fluid or free intraperitoneal gas. No abdominal wall hernia. Musculoskeletal: No acute or destructive bony abnormalities. Reconstructed images demonstrate no additional findings. IMPRESSION: 1. Right adrenal mass measuring 6.4 cm, possible malignancy. Recommend surgical consultation. Consider biochemical lab evaluation for functional status and pheochromocytoma prior to resection. JACR 2017 Aug; 14(8):1038-44, JCAT 2016 Mar-Apr; 40(2):194-200, Urol J 2006 Spring; 3(2):71-4. 2. No acute intra-abdominal or intrapelvic process. 3.  Aortic Atherosclerosis (ICD10-I70.0). Electronically Signed   By: Ozell Daring M.D.   On: 09/21/2024 14:56         "

## 2024-10-29 ENCOUNTER — Inpatient Hospital Stay: Admitting: Hospice and Palliative Medicine

## 2024-10-29 DIAGNOSIS — R918 Other nonspecific abnormal finding of lung field: Secondary | ICD-10-CM

## 2024-10-29 NOTE — Progress Notes (Addendum)
 I spoke with patient's son, Gwendlyn.  Per son, patient is currently at [facility].  They have filed for Medicaid with plan to keep him there long-term care.  Per son, patient is not interested in pursuing any treatment options for cancer.  Instead, patient and family plan to focus on keeping him comfortable until the end of his life.  They are interested in hospice involvement.  Of note, family had [facility] involved recently until the passing of patient's other son (Malcolm's brother)  Son understands that hospice might not be able to be involved immediately if Medicaid is pending.  In that event, I would recommend palliative care involvement at the SNF.  Hospice referral sent.

## 2024-11-21 ENCOUNTER — Inpatient Hospital Stay: Attending: Oncology

## 2024-11-21 DIAGNOSIS — R918 Other nonspecific abnormal finding of lung field: Secondary | ICD-10-CM

## 2024-11-21 NOTE — Progress Notes (Signed)
 Multidisciplinary Oncology Council Documentation  RALIEGH SCOBIE was presented by our [facility] on 11/21/2024, which included representatives from:  Palliative Care Dietitian  Physical/Occupational Therapist Nurse Navigator Genetics Social work Survivorship RN Financial Navigator Research RN   Kofi currently presents with history of cerebellar mass  We reviewed previous medical and familial history, history of present illness, and recent lab results along with all available histopathologic and imaging studies. The MOC considered available treatment options and made the following recommendations/referrals:  None  The MOC is a meeting of clinicians from various specialty areas who evaluate and discuss patients for whom a multidisciplinary approach is being considered. Final determinations in the plan of care are those of the provider(s).   Today's extended care, comprehensive team conference, Keevin was not present for the discussion and was not examined.
